# Patient Record
Sex: Male | Born: 1955
Health system: Southern US, Community
[De-identification: ages and names within clinical notes are randomized; demographics above are authoritative.]

## PROBLEM LIST (undated history)

## (undated) DIAGNOSIS — I639 Cerebral infarction, unspecified: Secondary | ICD-10-CM

## (undated) DIAGNOSIS — I10 Essential (primary) hypertension: Secondary | ICD-10-CM

## (undated) DIAGNOSIS — K859 Acute pancreatitis without necrosis or infection, unspecified: Secondary | ICD-10-CM

## (undated) DIAGNOSIS — R569 Unspecified convulsions: Secondary | ICD-10-CM

## (undated) DIAGNOSIS — E119 Type 2 diabetes mellitus without complications: Secondary | ICD-10-CM

## (undated) HISTORY — PX: ESOPHAGOGASTRODUODENOSCOPY: SHX1529

## (undated) SURGERY — Surgical Case
Anesthesia: *Unknown

---

## 1998-08-10 ENCOUNTER — Emergency Department (HOSPITAL_COMMUNITY): Admission: EM | Admit: 1998-08-10 | Discharge: 1998-08-10 | Payer: Self-pay | Admitting: Emergency Medicine

## 2005-10-08 ENCOUNTER — Emergency Department (HOSPITAL_COMMUNITY): Admission: EM | Admit: 2005-10-08 | Discharge: 2005-10-08 | Payer: Self-pay | Admitting: Emergency Medicine

## 2006-01-29 ENCOUNTER — Ambulatory Visit: Payer: Self-pay | Admitting: Family Medicine

## 2007-12-01 ENCOUNTER — Other Ambulatory Visit: Payer: Self-pay

## 2007-12-01 ENCOUNTER — Inpatient Hospital Stay: Payer: Self-pay | Admitting: *Deleted

## 2008-04-30 ENCOUNTER — Emergency Department: Payer: Self-pay | Admitting: Emergency Medicine

## 2011-04-09 ENCOUNTER — Inpatient Hospital Stay (HOSPITAL_COMMUNITY)
Admission: EM | Admit: 2011-04-09 | Discharge: 2011-04-12 | DRG: 603 | Disposition: A | Discharge status: Home / Self Care | Payer: Self-pay | Attending: Internal Medicine | Admitting: Internal Medicine

## 2011-04-09 ENCOUNTER — Emergency Department (HOSPITAL_COMMUNITY): Payer: Self-pay

## 2011-04-09 DIAGNOSIS — IMO0002 Reserved for concepts with insufficient information to code with codable children: Secondary | ICD-10-CM | POA: Diagnosis present

## 2011-04-09 DIAGNOSIS — I10 Essential (primary) hypertension: Secondary | ICD-10-CM | POA: Diagnosis present

## 2011-04-09 DIAGNOSIS — Z79899 Other long term (current) drug therapy: Secondary | ICD-10-CM

## 2011-04-09 DIAGNOSIS — Z23 Encounter for immunization: Secondary | ICD-10-CM

## 2011-04-09 DIAGNOSIS — F17200 Nicotine dependence, unspecified, uncomplicated: Secondary | ICD-10-CM | POA: Diagnosis present

## 2011-04-09 DIAGNOSIS — L03211 Cellulitis of face: Principal | ICD-10-CM | POA: Diagnosis present

## 2011-04-09 DIAGNOSIS — Z7982 Long term (current) use of aspirin: Secondary | ICD-10-CM

## 2011-04-09 DIAGNOSIS — E119 Type 2 diabetes mellitus without complications: Secondary | ICD-10-CM | POA: Diagnosis present

## 2011-04-09 DIAGNOSIS — I889 Nonspecific lymphadenitis, unspecified: Secondary | ICD-10-CM | POA: Diagnosis present

## 2011-04-09 DIAGNOSIS — L0201 Cutaneous abscess of face: Principal | ICD-10-CM | POA: Diagnosis present

## 2011-04-09 DIAGNOSIS — K122 Cellulitis and abscess of mouth: Secondary | ICD-10-CM

## 2011-04-09 HISTORY — DX: Essential (primary) hypertension: I10

## 2011-04-09 LAB — CBC
MCH: 27.6 pg (ref 26.0–34.0)
MCH: 28.1 pg (ref 26.0–34.0)
MCHC: 34.3 g/dL (ref 30.0–36.0)
MCV: 81.8 fL (ref 78.0–100.0)
Platelets: 180 10*3/uL (ref 150–400)
Platelets: 179 10*3/uL (ref 150–400)
RBC: 5.25 MIL/uL (ref 4.22–5.81)
RBC: 4.99 MIL/uL (ref 4.22–5.81)
RDW: 14 % (ref 11.5–15.5)
WBC: 7.1 10*3/uL (ref 4.0–10.5)

## 2011-04-09 LAB — BASIC METABOLIC PANEL
CO2: 28 mEq/L (ref 19–32)
Calcium: 10 mg/dL (ref 8.4–10.5)
Chloride: 102 mEq/L (ref 96–112)
GFR calc Af Amer: 75 mL/min — ABNORMAL LOW (ref >90)
Sodium: 139 mEq/L (ref 135–145)

## 2011-04-09 LAB — PROTIME-INR
INR: 0.91 (ref 0.00–1.49)
Prothrombin Time: 12.5 seconds (ref 11.6–15.2)

## 2011-04-09 MED ORDER — ZOLPIDEM TARTRATE 5 MG PO TABS: 5.0000 mg | ORAL_TABLET | Freq: Every evening | ORAL | Status: DC | PRN

## 2011-04-09 MED ORDER — CLINDAMYCIN PHOSPHATE 600 MG/50ML IV SOLN
600.0000 mg | Freq: Once | INTRAVENOUS | Status: AC
  Administered 2011-04-09: 600 mg via INTRAVENOUS
  Filled 2011-04-09: qty 50

## 2011-04-09 MED ORDER — FOLIC ACID 1 MG PO TABS
1.0000 mg | ORAL_TABLET | Freq: Every day | ORAL | Status: DC
  Administered 2011-04-09 – 2011-04-11 (×3): 1 mg via ORAL
  Filled 2011-04-09 (×4): qty 1

## 2011-04-09 MED ORDER — ACETAMINOPHEN 325 MG PO TABS: 650.0000 mg | ORAL_TABLET | Freq: Four times a day (QID) | ORAL | Status: DC | PRN

## 2011-04-09 MED ORDER — SENNOSIDES-DOCUSATE SODIUM 8.6-50 MG PO TABS: 1.0000 | ORAL_TABLET | Freq: Every day | ORAL | Status: DC | PRN

## 2011-04-09 MED ORDER — SODIUM CHLORIDE 0.9 % IV SOLN
INTRAVENOUS | Status: DC
  Administered 2011-04-09: 17:00:00 via INTRAVENOUS

## 2011-04-09 MED ORDER — ACETAMINOPHEN 650 MG RE SUPP: 650.0000 mg | Freq: Four times a day (QID) | RECTAL | Status: DC | PRN

## 2011-04-09 MED ORDER — HYDROCODONE-ACETAMINOPHEN 5-325 MG PO TABS: 1.0000 | ORAL_TABLET | ORAL | Status: DC | PRN

## 2011-04-09 MED ORDER — ENOXAPARIN SODIUM 40 MG/0.4ML ~~LOC~~ SOLN
40.0000 mg | SUBCUTANEOUS | Status: DC
  Administered 2011-04-09 – 2011-04-11 (×3): 40 mg via SUBCUTANEOUS
  Filled 2011-04-09 (×4): qty 0.4

## 2011-04-09 MED ORDER — POLYETHYLENE GLYCOL 3350 17 G PO PACK
17.0000 g | PACK | Freq: Every day | ORAL | Status: DC
Start: 2011-04-09 — End: 2011-04-12
  Administered 2011-04-09 – 2011-04-10 (×2): 17 g via ORAL
  Filled 2011-04-09 (×4): qty 1

## 2011-04-09 MED ORDER — MORPHINE SULFATE 2 MG/ML IJ SOLN: 1.0000 mg | INTRAMUSCULAR | Status: DC | PRN

## 2011-04-09 MED ORDER — SODIUM CHLORIDE 0.9 % IV SOLN
3.0000 g | Freq: Four times a day (QID) | INTRAVENOUS | Status: DC
  Administered 2011-04-09 – 2011-04-12 (×11): 3 g via INTRAVENOUS
  Filled 2011-04-09 (×13): qty 3

## 2011-04-09 MED ORDER — LISINOPRIL 40 MG PO TABS
40.0000 mg | ORAL_TABLET | Freq: Every day | ORAL | Status: DC
  Administered 2011-04-09 – 2011-04-11 (×3): 40 mg via ORAL
  Filled 2011-04-09 (×4): qty 1

## 2011-04-09 MED ORDER — PROMETHAZINE HCL 12.5 MG PO TABS
12.5000 mg | ORAL_TABLET | Freq: Four times a day (QID) | ORAL | Status: DC | PRN
  Filled 2011-04-09: qty 1

## 2011-04-09 MED ORDER — THERA M PLUS PO TABS
1.0000 | ORAL_TABLET | Freq: Every day | ORAL | Status: DC
  Administered 2011-04-09 – 2011-04-11 (×3): 1 via ORAL
  Filled 2011-04-09 (×4): qty 1

## 2011-04-09 MED ORDER — ONDANSETRON HCL 4 MG/2ML IJ SOLN: 4.0000 mg | Freq: Four times a day (QID) | INTRAMUSCULAR | Status: DC | PRN

## 2011-04-09 MED ORDER — ALUM & MAG HYDROXIDE-SIMETH 200-200-20 MG/5ML PO SUSP: 30.0000 mL | Freq: Four times a day (QID) | ORAL | Status: DC | PRN

## 2011-04-09 MED ORDER — VANCOMYCIN HCL IN DEXTROSE 1-5 GM/200ML-% IV SOLN
1000.0000 mg | Freq: Two times a day (BID) | INTRAVENOUS | Status: DC
  Administered 2011-04-09 – 2011-04-10 (×2): 1000 mg via INTRAVENOUS
  Filled 2011-04-09 (×3): qty 200

## 2011-04-09 MED ORDER — ONDANSETRON HCL 4 MG PO TABS: 4.0000 mg | ORAL_TABLET | Freq: Four times a day (QID) | ORAL | Status: DC | PRN

## 2011-04-09 MED ORDER — SODIUM CHLORIDE 0.9 % IV BOLUS (SEPSIS)
500.0000 mL | Freq: Once | INTRAVENOUS | Status: AC
  Administered 2011-04-09: 500 mL via INTRAVENOUS

## 2011-04-09 MED ORDER — PROMETHAZINE HCL 25 MG/ML IJ SOLN: 12.5000 mg | Freq: Four times a day (QID) | INTRAMUSCULAR | Status: DC | PRN

## 2011-04-09 MED ORDER — ALBUTEROL SULFATE (5 MG/ML) 0.5% IN NEBU: 2.5000 mg | INHALATION_SOLUTION | RESPIRATORY_TRACT | Status: DC | PRN

## 2011-04-09 MED ORDER — IOHEXOL 300 MG/ML  SOLN
75.0000 mL | Freq: Once | INTRAMUSCULAR | Status: AC | PRN
  Administered 2011-04-09: 75 mL via INTRAVENOUS

## 2011-04-09 NOTE — Progress Notes (Signed)
ANTIBIOTIC CONSULT NOTE - INITIAL  Pharmacy Consult for  Vancomycin & Unasyn Indication:   Facial cellulitis  No Known Allergies  Patient Measurements: Height: 5\' 8"  (172.7 cm) Weight: 210 lb (95.255 kg) IBW/kg (Calculated) : 68.4  Dosing Weight: 95 kg  Vital Signs: Temp: 97.5 F (36.4 C) (11/12 1700) Temp src: Oral (11/12 1700) BP: 156/103 mmHg (11/12 1742) Pulse Rate: 95  (11/12 1700)   Labs:  Basename 04/09/11 1711 04/09/11 1024  WBC 6.8 7.1  HGB 14.0 14.5  PLT 179 180  LABCREA -- --  CREATININE -- 1.23   Estimated Creatinine Clearance: 76 ml/min (by C-G formula based on Cr of 1.23).   Microbiology:  No cultures  Medical History: Past Medical History  Diagnosis Date  . Hypertension    Diet-controlled DM  Smoker  Medications:     Lisinopril 40 mg daily (as at home)    Folic Acid 1 mg daily, MVI, Miralax,     Lovenox 40 mg SQ q24hrs   Assessment:    Clindmycin 600 mg IV given in ED ~ 11:30 am. Now changing to Unasyn and Vancomycin.  Evaluted by ENT. May require I&D if suppurates.  Goal of Therapy:  Vancomycin trough level 10-15 mcg/ml Appropriate Unasyn dose for renal function and infection  Plan:    Vancomycin 1 gram IV q12hrs.   Unasyn 3 grams IV q6 hrs.   Will follow up progress and will consider checking Vancomycin trough level if >5 days therapy needed.  Dennie Fetters 04/09/2011,6:04 PM

## 2011-04-09 NOTE — H&P (Signed)
PCP:   No primary provider on file.   Chief Complaint:  Sub-mandibular swelling since last Tuesday  HPI: Patient is a 55 year old African American male, who has a history of hypertension, claims he has a history of diet-controlled diabetes who presents to the hospital with the above-noted complaints. The patient he was in his usual state of health until last Tuesday when he tried to dye his beard with "haircare ferment". Since then patient noticed that on the right jaw area he had some induration, he claims that over the past week in duration slowly spread and now mostly involves his submental and submandibular area. He denies any fever. He claims that the swelling has significantly increased over the past 2-3 days. He denies any recent dental problems. He denies any cough or shortness of breath. On exam he does not have any stridor. He appears very comfortable. The hospitalist service has not been asked to evaluate and admit this patient for further treatment and monitoring. Please note the CT scan of the area was done in the ED which only shows cellulitis but is not show an abscess. I have already placed an official consult with ENT.  Review of Systems:  The patient denies anorexia, fever, weight loss,, vision loss, decreased hearing, hoarseness, chest pain, syncope, dyspnea on exertion, peripheral edema, balance deficits, hemoptysis, abdominal pain, melena, hematochezia, severe indigestion/heartburn, hematuria, incontinence, genital sores, muscle weakness, suspicious skin lesions, transient blindness, difficulty walking, depression, unusual weight change, abnormal bleeding, enlarged lymph nodes, angioedema, and breast masses.  Past Medical History: Past Medical History  Diagnosis Date  . Hypertension    History reviewed. No pertinent past surgical history.  Medications: Prior to Admission medications   Medication Sig Start Date End Date Taking? Authorizing Provider    Aspirin-Salicylamide-Caffeine (BC HEADACHE POWDER PO) Take 1 packet by mouth daily as needed. For pain    Yes Historical Provider, MD  lisinopril (PRINIVIL,ZESTRIL) 40 MG tablet Take 40 mg by mouth daily.     Yes Historical Provider, MD    Allergies:  No Known Allergies  Social History:  reports that he has been smoking.  He does not have any smokeless tobacco history on file. He reports that he drinks alcohol. He reports that he does not use illicit drugs.  Family History: History reviewed. No pertinent family history.  Physical Exam: Blood pressure 123/80, pulse 86, temperature 98.2 F (36.8 C), temperature source Oral, resp. rate 18, SpO2 97.00%. General appearance -looks very comfortable, nontoxic looking. No stridor. Is awake alert with clear speech. HEENT -atraumatic normocephalic pupils are equally reactive to light and accommodation. Neck -there is a approximate 4 inches x2-1/2 inches indurated area in the submental region. This indurated area is tender. Otherwise the neck is supple. Chest -bilaterally clear to auscultation. There is no stridor. CVS -heart sounds are regular no murmurs heard. Abdomen -soft, bowel sounds are present, nontender and nondistended. Extremities -no edema. Neurology -awake alert, nonfocal exam. Skin -he does appear to have mild de-squamation in his facial area. No frank rash visible. Wounds -N./A.  Labs on Admission:   Basename 04/09/11 1024  NA 139  K 4.1  CL 102  CO2 28  GLUCOSE 129*  BUN 11  CREATININE 1.23  CALCIUM 10.0  MG --  PHOS --   No results found for this basename: AST:2,ALT:2,ALKPHOS:2,BILITOT:2,PROT:2,ALBUMIN:2 in the last 72 hours No results found for this basename: LIPASE:2,AMYLASE:2 in the last 72 hours  Basename 04/09/11 1024  WBC 7.1  NEUTROABS --  HGB 14.5  HCT 43.2  MCV 82.3  PLT 180   No results found for this basename: CKTOTAL:3,CKMB:3,CKMBINDEX:3,TROPONINI:3 in the last 72 hours No results found for this  basename: TSH,T4TOTAL,FREET3,T3FREE,THYROIDAB in the last 72 hours No results found for this basename: VITAMINB12:2,FOLATE:2,FERRITIN:2,TIBC:2,IRON:2,RETICCTPCT:2 in the last 72 hours  Radiological Exams on Admission: Ct Soft Tissue Neck W Contrast  04/09/2011  *RADIOLOGY REPORT*  Clinical Data: Left neck swelling.  Assess for abscess.  CT NECK WITH CONTRAST  Technique:  Multidetector CT imaging of the neck was performed with intravenous contrast.  Contrast: 75mL OMNIPAQUE IOHEXOL 300 MG/ML IV SOLN  Comparison: None.  Findings: Lung apices are clear.  No superior mediastinal pathology.  Limited visualization of the intracranial contents does not show any abnormality.  Both parotid glands are normal.  There are slightly prominent lymph nodes adjacent to the posterior margins of the parotid glands. There are slightly prominent lymph nodes throughout the neck bilaterally including levels one, II, III and four.  None of these lymph nodes exceeds 12 mm in diameter.  None shows suppuration. There may be very slight subcutaneous edema that could go along with mild cellulitis.  This appears to be bilateral.  I do not see any evidence of abscess or advanced inflammatory disease.  No mucosal or submucosal lesion is seen.  No evidence of mass lesion. Arterial and venous structures are patent.  The thyroid gland and submandibular glands are normal.  IMPRESSION: Mild subcutaneous edema suggesting mild cellulitis.  No evidence of abscess or advanced to a soft tissue inflammation.  Mild reactive lymphadenopathy on both sides of the neck.  No large / dominant nodes or suppurative nodes.  Original Report Authenticated By: Thomasenia Sales, M.D.    Assessment/Plan Present on Admission:  .Ludwig's angina -This likely is a soft tissue infection involving the submental area, highly suspicious for Ludwig's angina. -Although the patient does appear to have significant induration, the CT scan surprisingly does not show any  abscess. -I will start the patient on vancomycin and Unasyn, I placed ENT consultation with [facility], Dr. Christia Reading will evaluate the patient. -Patient does not appear to have any airway compromise clinically, we will continue to monitor him.   Marland KitchenHTN (hypertension) -Continue with lisinopril.   .DM (diabetes mellitus) -Apparently this is diet controlled, we will check a hemoglobin A1c.  Total time spent for admission 35 minutes.  Daniel [name] 04/09/2011, 1:02 PM

## 2011-04-09 NOTE — ED Provider Notes (Signed)
History     CSN: [id number] Arrival date & time: 04/09/2011  9:43 AM   First MD Initiated Contact with Patient 04/09/11 1014      Chief Complaint  Patient presents with  . Facial Swelling   Pleasant 55 year old gentleman with a known history of hypertension and diet-controlled diabetes. He states he used "haircare ferment" on his beard area. Several days ago. Since then. He has noticed new swelling and tenderness underneath his chin since yesterday. He's had no fevers no vomiting. No weakness or dizziness. Denies any difficulty breathing. His tetanus is up to date, 3 years ago. Patient denies any dental pain  (Consider location/radiation/quality/duration/timing/severity/associated sxs/prior treatment) HPI  Past Medical History  Diagnosis Date  . Hypertension     History reviewed. No pertinent past surgical history.  History reviewed. No pertinent family history.  History  Substance Use Topics  . Smoking status: Current Everyday Smoker  . Smokeless tobacco: Not on file  . Alcohol Use: Yes      Review of Systems  All other systems reviewed and are negative.   Allergies  Review of patient's allergies indicates no known allergies.  Home Medications   Current Outpatient Rx  Name Route Sig Dispense Refill  . BC HEADACHE POWDER PO Oral Take 1 packet by mouth daily as needed. For pain     . LISINOPRIL 40 MG PO TABS Oral Take 40 mg by mouth daily.        BP 144/95  Pulse 104  Temp 97.7 F (36.5 C)  Resp 18  SpO2 98%  Physical Exam  Constitutional: He is oriented to person, place, and time. He appears well-developed and well-nourished.  HENT:  Head: Normocephalic and atraumatic.       Swelling induration, and tenderness in the submandibular area centrally. Oral pharynx is clear. No tongue or lip swelling.  Eyes: Conjunctivae and EOM are normal. Pupils are equal, round, and reactive to light.  Neck: Neck supple.  Cardiovascular: Normal rate and regular rhythm.   Exam reveals no gallop and no friction rub.   No murmur heard. Pulmonary/Chest: Breath sounds normal. He has no wheezes. He has no rales. He exhibits no tenderness.  Abdominal: Soft. Bowel sounds are normal. He exhibits no distension. There is no tenderness. There is no rebound and no guarding.  Musculoskeletal: Normal range of motion.  Neurological: He is alert and oriented to person, place, and time. No cranial nerve deficit. Coordination normal.  Skin: Skin is warm and dry. No rash noted.  Psychiatric: He has a normal mood and affect.    ED Course  Procedures (including critical care time)  Labs Reviewed - No data to display No results found.   No diagnosis found.    MDM  Pt is seen and examined;  Initial history and physical completed.  Will follow.          Charda Janis A. Patrica Duel, MD 04/09/11 1025

## 2011-04-09 NOTE — ED Notes (Signed)
IV SL  Per PT request

## 2011-04-09 NOTE — ED Notes (Signed)
Patient presents with swelling and tightness under chin since yesterday.  Airway patent and intact, patient denies change in breathing.  Patient has taken benadryl but has not reduced symptoms.  Skin intact.

## 2011-04-09 NOTE — Consult Note (Addendum)
Reason for Consult:Neck infection Referring Physician: Hospitalist  Daniel [name] is an 55 y.o. male.  HPI: 55 year old african [place] male who used [employer] on beard 7 days ago.  Woke the next morning with rash and swelling of facial skin in same distribution.  Took Benadryl as advised by his pharmacist.  Noticed improvement in facial swelling a few days later but developed a firm area in the right neck yesterday that settled down into his submental neck by this morning.  Went to the ER where a CT scan was performed.  Admitted for medical therapy.  Describes pain as mild and unchanged.  Nothing aggravates or relieves symptom.  Past Medical History  Diagnosis Date  . Hypertension     History reviewed. No pertinent past surgical history.  History reviewed. No pertinent family history.  Social History:  reports that he has been smoking.  He does not have any smokeless tobacco history on file. He reports that he drinks alcohol. He reports that he does not use illicit drugs.  Allergies: No Known Allergies  Medications: I have reviewed the patient's current medications.  Results for orders placed during the hospital encounter of 04/09/11 (from the past 48 hour(s))  CBC     Status: Normal   Collection Time   04/09/11 10:24 AM      Component Value Range Comment   WBC 7.1  4.0 - 10.5 (K/uL)    RBC 5.25  4.22 - 5.81 (MIL/uL)    Hemoglobin 14.5  13.0 - 17.0 (g/dL)    HCT 16.1  09.6 - 04.5 (%)    MCV 82.3  78.0 - 100.0 (fL)    MCH 27.6  26.0 - 34.0 (pg)    MCHC 33.6  30.0 - 36.0 (g/dL)    RDW 40.9  81.1 - 91.4 (%)    Platelets 180  150 - 400 (K/uL)   BASIC METABOLIC PANEL     Status: Abnormal   Collection Time   04/09/11 10:24 AM      Component Value Range Comment   Sodium 139  135 - 145 (mEq/L)    Potassium 4.1  3.5 - 5.1 (mEq/L)    Chloride 102  96 - 112 (mEq/L)    CO2 28  19 - 32 (mEq/L)    Glucose, Bld 129 (*) 70 - 99 (mg/dL)    BUN 11  6 - 23 (mg/dL)    Creatinine, Ser  7.82  0.50 - 1.35 (mg/dL)    Calcium 95.6  8.4 - 10.5 (mg/dL)    GFR calc non Af Amer 64 (*) >90 (mL/min)    GFR calc Af Amer 75 (*) >90 (mL/min)   PROTIME-INR     Status: Normal   Collection Time   04/09/11  1:18 PM      Component Value Range Comment   Prothrombin Time 12.5  11.6 - 15.2 (seconds)    INR 0.91  0.00 - 1.49    HEMOGLOBIN A1C     Status: Abnormal   Collection Time   04/09/11  1:19 PM      Component Value Range Comment   Hemoglobin A1C 5.7 (*) <5.7 (%)    Mean Plasma Glucose 117 (*) <117 (mg/dL)     Ct Soft Tissue Neck W Contrast  04/09/2011  *RADIOLOGY REPORT*  Clinical Data: Left neck swelling.  Assess for abscess.  CT NECK WITH CONTRAST  Technique:  Multidetector CT imaging of the neck was performed with intravenous contrast.  Contrast:  75mL OMNIPAQUE IOHEXOL 300 MG/ML IV SOLN  Comparison: None.  Findings: Lung apices are clear.  No superior mediastinal pathology.  Limited visualization of the intracranial contents does not show any abnormality.  Both parotid glands are normal.  There are slightly prominent lymph nodes adjacent to the posterior margins of the parotid glands. There are slightly prominent lymph nodes throughout the neck bilaterally including levels one, II, III and four.  None of these lymph nodes exceeds 12 mm in diameter.  None shows suppuration. There may be very slight subcutaneous edema that could go along with mild cellulitis.  This appears to be bilateral.  I do not see any evidence of abscess or advanced inflammatory disease.  No mucosal or submucosal lesion is seen.  No evidence of mass lesion. Arterial and venous structures are patent.  The thyroid gland and submandibular glands are normal.  IMPRESSION: Mild subcutaneous edema suggesting mild cellulitis.  No evidence of abscess or advanced to a soft tissue inflammation.  Mild reactive lymphadenopathy on both sides of the neck.  No large / dominant nodes or suppurative nodes.  Original Report Authenticated  By: Thomasenia Sales, M.D.    Review of Systems  All other systems reviewed and are negative.   Blood pressure 136/98, pulse 86, temperature 97.3 F (36.3 C), temperature source Oral, resp. rate 20, height 5\' 8"  (1.727 m), weight 95.255 kg (210 lb), SpO2 97.00%. Physical Exam  Constitutional: He is oriented to person, place, and time. He appears well-developed and well-nourished. No distress.  HENT:  Head: Normocephalic and atraumatic.  Right Ear: External ear normal.  Left Ear: External ear normal.  Nose: Nose normal.  Mouth/Throat: Oropharynx is clear and moist. No oropharyngeal exudate.       Upper denture.  Lower partial.  Poor lower dentition with a loose tooth on the left.  No tender teeth.  Eyes: Conjunctivae and EOM are normal. Pupils are equal, round, and reactive to light.  Neck: Normal range of motion. No tracheal deviation present. No thyromegaly present.  Cardiovascular:       Defer to medical team.  Respiratory: No stridor.       Defer to medical team.  GI:       Did not examine.  Genitourinary:       Did not examine.  Musculoskeletal: Normal range of motion. He exhibits no edema.  Lymphadenopathy:    He has cervical adenopathy (prominent submental firmness, no fluctuance, mildly tender).  Neurological: He is alert and oriented to person, place, and time. No cranial nerve deficit.  Skin: Skin is warm and dry.  Psychiatric: He has a normal mood and affect.    Assessment/Plan: Submental cellulitis, acute lymphadenitis.  This does not represent true Ludwig's angina with no floor of mouth involvement.  I personally reviewed the neck CT showing enlarged nodes through both sides of the neck, particularly in the submental neck with associated soft tissue stranding and no clear evidence of a fluid collection.  Recommend IV antibiotic therapy with clindamycin or Unasyn.  Currently, it is very possible that infection will resolve with IV antibiotic therapy alone.  May  require I&D if suppurates.  Airway compromise is not likely at this point.  Will follow daily.  Upon further review of the CT, there is lucency around a left premolar mandibular tooth root.  This may represent the source of his infection.  Recommend oral surgery consultation.  Dandria Griego D 04/09/2011, 5:08 PM

## 2011-04-10 LAB — BASIC METABOLIC PANEL
BUN: 10 mg/dL (ref 6–23)
CO2: 27 mEq/L (ref 19–32)
Chloride: 104 mEq/L (ref 96–112)
Creatinine, Ser: 1.11 mg/dL (ref 0.50–1.35)

## 2011-04-10 LAB — CBC
HCT: 39.6 % (ref 39.0–52.0)
MCV: 82.2 fL (ref 78.0–100.0)
RBC: 4.82 MIL/uL (ref 4.22–5.81)
RDW: 13.9 % (ref 11.5–15.5)
WBC: 5.2 10*3/uL (ref 4.0–10.5)

## 2011-04-10 NOTE — Progress Notes (Signed)
04/10/2011 Daniel [name], [name] SP[facility] Case Management Note [phone]       Information and application for the [facility] in [facility] given to patient.

## 2011-04-10 NOTE — Progress Notes (Signed)
Subjective: Unchanged overnight. No problems breathing or swallowing.  Objective: Vital signs in last 24 hours: Temp:  [97.3 F (36.3 C)-98.7 F (37.1 C)] 98.7 F (37.1 C) (11/13 0516) Pulse Rate:  [86-97] 97  (11/13 0516) Resp:  [20] 20  (11/13 0516) BP: (136-156)/(87-103) 144/87 mmHg (11/13 0950) SpO2:  [97 %] 97 % (11/13 0516) Weight:  [95.255 kg (210 lb)] 210 lb (95.255 kg) (11/12 1553) Weight change:  Body mass index is 31.93 kg/(m^2).  Intake/Output from previous day: 11/12 0701 - 11/13 0700 In: 2184 [P.O.:600; I.V.:1184; IV Piggyback:400] Out: -    PHYSICAL EXAM: Gen Exam: Awake and alert with clear speech.   HEENT-unchanged induration in the submental/submandibular area. Neck: Supple, No JVD.   Chest: B/L Clear.   CVS: S1 S2 Regular, no murmurs.  Abdomen: soft, BS +, non tender, non distended.  Extremities: no edema, warm.   Neurologic: Non Focal.   Skin: No Rash.   Wounds: N/A.    Lab Results:  Basename 04/10/11 0712 04/09/11 1711  WBC 5.2 6.8  HGB 12.8* 14.0  HCT 39.6 40.8  PLT 168 179   CMET CMP     Component Value Date/Time   NA 139 04/10/2011 0712   K 4.2 04/10/2011 0712   CL 104 04/10/2011 0712   CO2 27 04/10/2011 0712   GLUCOSE 116* 04/10/2011 0712   BUN 10 04/10/2011 0712   CREATININE 1.11 04/10/2011 0712   CALCIUM 9.1 04/10/2011 0712   GFRNONAA 73* 04/10/2011 0712   GFRAA 85* 04/10/2011 0712    LIPIDS @LASTLIPID @ @LASTBNP @ COAGS  Basename 04/09/11 1318  INR 0.91   MICROBIOLOGY: No results found for this or any previous visit (from the past 240 hour(s)).  Studies/Results: Ct Soft Tissue Neck W Contrast  04/09/2011  *RADIOLOGY REPORT*  Clinical Data: Left neck swelling.  Assess for abscess.  CT NECK WITH CONTRAST  Technique:  Multidetector CT imaging of the neck was performed with intravenous contrast.  Contrast: 75mL OMNIPAQUE IOHEXOL 300 MG/ML IV SOLN  Comparison: None.  Findings: Lung apices are clear.  No superior mediastinal  pathology.  Limited visualization of the intracranial contents does not show any abnormality.  Both parotid glands are normal.  There are slightly prominent lymph nodes adjacent to the posterior margins of the parotid glands. There are slightly prominent lymph nodes throughout the neck bilaterally including levels one, II, III and four.  None of these lymph nodes exceeds 12 mm in diameter.  None shows suppuration. There may be very slight subcutaneous edema that could go along with mild cellulitis.  This appears to be bilateral.  I do not see any evidence of abscess or advanced inflammatory disease.  No mucosal or submucosal lesion is seen.  No evidence of mass lesion. Arterial and venous structures are patent.  The thyroid gland and submandibular glands are normal.  IMPRESSION: Mild subcutaneous edema suggesting mild cellulitis.  No evidence of abscess or advanced to a soft tissue inflammation.  Mild reactive lymphadenopathy on both sides of the neck.  No large / dominant nodes or suppurative nodes.  Original Report Authenticated By: Thomasenia Sales, M.D.    Medications:  Scheduled:   . ampicillin-sulbactam (UNASYN) IV  3 g Intravenous Q6H  . clindamycin (CLEOCIN) IV  600 mg Intravenous Once  . enoxaparin  40 mg Subcutaneous Q24H  . folic acid  1 mg Oral Daily  . lisinopril  40 mg Oral Daily  . multivitamins ther. w/minerals  1 tablet Oral Daily  .  polyethylene glycol  17 g Oral Daily  . vancomycin  1,000 mg Intravenous Q12H    Assessment/Plan: Principal Problem:  *Lymphadenitis -Continue with Unasyn. -DC vancomycin -Appreciate ENT input. -Have left a message for oral surgery at Dr. Theora Gianotti office. Awaiting call back.  Active Problems:   HTN (hypertension) -Controlled with lisinopril   DM (diabetes mellitus) -HbA1c is 5.7.  Disposition -Remain inpatient     Maretta Bees, MD. 04/10/2011, 11:40 AM

## 2011-04-10 NOTE — Progress Notes (Signed)
Subjective: Did fine overnight.  Does not feel that neck is any worse.  Good swallow and breathing.  Objective: Vital signs in last 24 hours: Temp:  [97.3 F (36.3 C)-98.7 F (37.1 C)] 98.7 F (37.1 C) (11/13 0516) Pulse Rate:  [86-104] 97  (11/13 0516) Resp:  [18-20] 20  (11/13 0516) BP: (123-156)/(80-103) 142/91 mmHg (11/13 0516) SpO2:  [97 %-98 %] 97 % (11/13 0516) Weight:  [95.255 kg (210 lb)] 210 lb (95.255 kg) (11/12 1553)  General appearance: alert, cooperative and no distress Head: Normocephalic, without obvious abnormality, atraumatic Eyes: conjunctivae/corneas clear. PERRL, EOM's intact. Fundi benign. Throat: lips, mucosa, and tongue normal; teeth and gums normal and with upper denture and lower partial.  Left lower premolar loose. Neck: no adenopathy, no carotid bruit, no JVD, supple, symmetrical, trachea midline, thyroid not enlarged, symmetric, no tenderness/mass/nodules and firm submental area no different than yesterday with no fluctuance.  @LABLAST2 (wbc:2,hgb:2,hct:2,plt:2)  Basename 04/10/11 0712 04/09/11 1711 04/09/11 1024  NA 139 -- 139  K 4.2 -- 4.1  CL 104 -- 102  CO2 27 -- 28  GLUCOSE 116* -- 129*  BUN 10 -- 11  CREATININE 1.11 1.08 --  CALCIUM 9.1 -- 10.0    Medications: I have reviewed the patient's current medications.  Assessment/Plan: Submental cellulitis/acute lymphadenitis. No worsening since yesterday.  WBC normal, no fever.  Continue IV antibiotics.  Continue to follow.  May still require I&D.  Recommend oral surgery consultation regarding loose lower premolar as potential source of infection.   LOS: 1 day   Antonea Gaut D 04/10/2011, 8:41 AM

## 2011-04-11 ENCOUNTER — Inpatient Hospital Stay (HOSPITAL_COMMUNITY): Payer: Self-pay

## 2011-04-11 LAB — CBC
HCT: 41 % (ref 39.0–52.0)
Hemoglobin: 13.7 g/dL (ref 13.0–17.0)
MCH: 27.5 pg (ref 26.0–34.0)
MCHC: 33.4 g/dL (ref 30.0–36.0)

## 2011-04-11 NOTE — Progress Notes (Signed)
Subjective: Thought the neck improved some yesterday but seemed to return to original state.  Still normal swallow and breathing.  Objective: Vital signs in last 24 hours: Temp:  [97.3 F (36.3 C)-98.2 F (36.8 C)] 98.2 F (36.8 C) (11/14 0620) Pulse Rate:  [61-77] 68  (11/14 0620) Resp:  [14-20] 20  (11/14 0620) BP: (137-148)/(83-95) 137/83 mmHg (11/14 0620) SpO2:  [96 %-99 %] 99 % (11/14 0620)  General appearance: alert, cooperative and no distress Throat: lips, mucosa, and tongue normal; teeth and gums normal Neck: submental firmness no different than yesterday, stable size, not fluctuant  @LABLAST2 (wbc:2,hgb:2,hct:2,plt:2)  Basename 04/10/11 0712 04/09/11 1711 04/09/11 1024  NA 139 -- 139  K 4.2 -- 4.1  CL 104 -- 102  CO2 27 -- 28  GLUCOSE 116* -- 129*  BUN 10 -- 11  CREATININE 1.11 1.08 --  CALCIUM 9.1 -- 10.0    Medications: I have reviewed the patient's current medications.  Assessment/Plan: Submental cellulitis/lymphadenitis Exam unchanged today.  Still no fluctuance to clearly suggest abscess. Normal WBC and no fever.  Continue IV antibiotic.  If no better tomorrow, will consider repeat imaging.  Hopefully, will improve by tomorrow and can then discharge on oral antibiotic.  Still waiting for oral surgery consultation.   LOS: 2 days   Kerrianne Jeng D 04/11/2011, 8:52 AM

## 2011-04-11 NOTE — Progress Notes (Signed)
Subjective: -Unchanged. -No difficulty breathing or swallowing.  Objective: Vital signs in last 24 hours: Temp:  [97.3 F (36.3 C)-98.2 F (36.8 C)] 98.2 F (36.8 C) (11/14 0620) Pulse Rate:  [61-77] 68  (11/14 0620) Resp:  [14-20] 20  (11/14 0620) BP: (137-148)/(83-95) 137/83 mmHg (11/14 0620) SpO2:  [96 %-99 %] 99 % (11/14 0620) Weight change:  Body mass index is 31.93 kg/(m^2).  Intake/Output from previous day: 11/13 0701 - 11/14 0700 In: 1258.8 [P.O.:720; I.V.:438.8; IV Piggyback:100] Out: 4 [Urine:4]   PHYSICAL EXAM: Gen Exam: Awake and alert with clear speech.   HEENT-unchanged induration in the submental/submandibular area. Still somewhat tender, no obvious fluctuation. Neck: Supple, No JVD.   Chest: B/L Clear.   CVS: S1 S2 Regular, no murmurs.  Abdomen: soft, BS +, non tender, non distended.  Extremities: no edema, warm.   Neurologic: Non Focal.   Skin: No Rash.   Wounds: N/A.    Lab Results:  Basename 04/11/11 0720 04/10/11 0712  WBC 5.2 5.2  HGB 13.7 12.8*  HCT 41.0 39.6  PLT 191 168   CMET CMP     Component Value Date/Time   NA 139 04/10/2011 0712   K 4.2 04/10/2011 0712   CL 104 04/10/2011 0712   CO2 27 04/10/2011 0712   GLUCOSE 116* 04/10/2011 0712   BUN 10 04/10/2011 0712   CREATININE 1.11 04/10/2011 0712   CALCIUM 9.1 04/10/2011 0712   GFRNONAA 73* 04/10/2011 0712   GFRAA 85* 04/10/2011 0712    Basename 04/09/11 1318  INR 0.91   MICROBIOLOGY: No results found for this or any previous visit (from the past 240 hour(s)).  Studies/Results: No results found.  Medications:  Scheduled:    . ampicillin-sulbactam (UNASYN) IV  3 g Intravenous Q6H  . enoxaparin  40 mg Subcutaneous Q24H  . folic acid  1 mg Oral Daily  . lisinopril  40 mg Oral Daily  . multivitamins ther. w/minerals  1 tablet Oral Daily  . polyethylene glycol  17 g Oral Daily  . DISCONTD: vancomycin  1,000 mg Intravenous Q12H    Assessment/Plan: Principal Problem:  *Lymphadenitis -Continue with Unasyn. -Appreciate ENT input. Possibility of the imaging tomorrow for ENT note. -Have left a message for oral surgery at Dr. Theora Gianotti office again today (did not hear back from them yesterday). Awaiting call back.  Active Problems:  HTN (hypertension) -Controlled with lisinopril   DM (diabetes mellitus) -HbA1c is 5.7.  Disposition -Remain inpatient  Maretta Bees, MD. 04/11/2011, 11:46 AM

## 2011-04-12 MED ORDER — ACETAMINOPHEN 325 MG PO TABS: 650.0000 mg | ORAL_TABLET | Freq: Four times a day (QID) | ORAL | Status: AC | PRN

## 2011-04-12 MED ORDER — AMOXICILLIN-POT CLAVULANATE 875-125 MG PO TABS: 1.0000 | ORAL_TABLET | Freq: Two times a day (BID) | ORAL | Status: AC

## 2011-04-12 NOTE — Progress Notes (Signed)
Subjective: No complaints this morning.  Eating well.  Normal breathing.  Objective: Vital signs in last 24 hours: Temp:  [96.9 F (36.1 C)-98.5 F (36.9 C)] 98.5 F (36.9 C) (11/15 0500) Pulse Rate:  [69-82] 82  (11/15 0500) Resp:  [20-22] 20  (11/15 0500) BP: (136-159)/(80-106) 136/100 mmHg (11/15 0500) SpO2:  [97 %-99 %] 97 % (11/15 0500)  General appearance: alert, cooperative and no distress Neck: no adenopathy, supple, symmetrical, trachea midline and thyroid not enlarged, symmetric, no tenderness/mass/nodules, submental firmness unchanged with no fluctuance  @LABLAST2 (wbc:2,hgb:2,hct:2,plt:2)  Basename 04/10/11 0712 04/09/11 1711 04/09/11 1024  NA 139 -- 139  K 4.2 -- 4.1  CL 104 -- 102  CO2 27 -- 28  GLUCOSE 116* -- 129*  BUN 10 -- 11  CREATININE 1.11 1.08 --  CALCIUM 9.1 -- 10.0    Medications: I have reviewed the patient's current medications.  Assessment/Plan: Submental cellulitis/lymphadenitis The submental area remains firm and not fluctuant and has not worsened or improved much since admission.  WBC and temperature have been normal.  I discussed case with radiology who reviewed his admission CT and still did not feel that an abscess was present on the CT.  He did not feel that ultrasound would be a very good way look for fluid.  I discussed the case with his admitting physician.  Since he has remained stable during his admission, we agreed to discharge him on oral Augmentin with close follow-up.  He will follow-up with me Monday but his wife knows to call our office if anything worsens between now and then.   LOS: 3 days   Kamiah Fite D 04/12/2011, 8:47 AM

## 2011-04-12 NOTE — Discharge Summary (Signed)
PATIENT DETAILS Name: Daniel [name] Age: 55 y.o. Sex: male Date of Birth: 02-Jan-1956 MRN: [id number]. Admit Date: 04/09/2011 Admitting Physician: Jeoffrey Massed PCP:No primary provider on file.  PRIMARY DISCHARGE DIAGNOSIS:  Principal Problem:  *Submental cellulitis/Lymphadenitis Active Problems:  HTN (hypertension)  DM (diabetes mellitus)-diet controlled      PAST MEDICAL HISTORY: Past Medical History  Diagnosis Date  . Hypertension     DISCHARGE MEDICATIONS: Current Discharge Medication List    START taking these medications   Details  acetaminophen (TYLENOL) 325 MG tablet Take 2 tablets (650 mg total) by mouth every 6 (six) hours as needed (or Fever >/= 101). Qty: 30 tablet    amoxicillin-clavulanate (AUGMENTIN) 875-125 MG per tablet Take 1 tablet by mouth 2 (two) times daily. Qty: 20 tablet, Refills: 0      CONTINUE these medications which have NOT CHANGED   Details  Aspirin-Salicylamide-Caffeine (BC HEADACHE POWDER PO) Take 1 packet by mouth daily as needed. For pain     lisinopril (PRINIVIL,ZESTRIL) 40 MG tablet Take 40 mg by mouth daily.           BRIEF HPI:  See H&P, Labs, Consult and Test reports for all details in brief, patient was admitted for and swelling of his submental area. The CT scan of the did not show any abscess, and he was then consulted. Patient was placed on IV Unasyn. For further details please see the history and physical that was dictated on admission.  CONSULTATIONS:   #1 ENT  PERTINENT RADIOLOGIC STUDIES: Dg Orthopantogram  04/11/2011  *RADIOLOGY REPORT*  Clinical Data: Dental disease.  DG ORTHOPANTOGRAM/PANORAMIC  Comparison: CT 04/09/2011  Findings: There is no residual maxillary dentition.  There are several remaining mandibular teeth.  I do not see any advanced caries, but there is diffuse periodontal disease with peri root lucency, particularly evident affecting teeth numbers 18, 21, 24 and 31.  No evidence of intraosseous  abscess.  IMPRESSION: Considerable periodontal disease affecting the residual mandibular dentition as outlined above.  Original Report Authenticated By: Thomasenia Sales, M.D.   Ct Soft Tissue Neck W Contrast  04/09/2011  *RADIOLOGY REPORT*  Clinical Data: Left neck swelling.  Assess for abscess.  CT NECK WITH CONTRAST  Technique:  Multidetector CT imaging of the neck was performed with intravenous contrast.  Contrast: 75mL OMNIPAQUE IOHEXOL 300 MG/ML IV SOLN  Comparison: None.  Findings: Lung apices are clear.  No superior mediastinal pathology.  Limited visualization of the intracranial contents does not show any abnormality.  Both parotid glands are normal.  There are slightly prominent lymph nodes adjacent to the posterior margins of the parotid glands. There are slightly prominent lymph nodes throughout the neck bilaterally including levels one, II, III and four.  None of these lymph nodes exceeds 12 mm in diameter.  None shows suppuration. There may be very slight subcutaneous edema that could go along with mild cellulitis.  This appears to be bilateral.  I do not see any evidence of abscess or advanced inflammatory disease.  No mucosal or submucosal lesion is seen.  No evidence of mass lesion. Arterial and venous structures are patent.  The thyroid gland and submandibular glands are normal.  IMPRESSION: Mild subcutaneous edema suggesting mild cellulitis.  No evidence of abscess or advanced to a soft tissue inflammation.  Mild reactive lymphadenopathy on both sides of the neck.  No large / dominant nodes or suppurative nodes.  Original Report Authenticated By: Thomasenia Sales, M.D.     PERTINENT LAB  RESULTS: CBC:  Basename 04/11/11 0720 04/10/11 0712  WBC 5.2 5.2  HGB 13.7 12.8*  HCT 41.0 39.6  PLT 191 168   CMET CMP     Component Value Date/Time   NA 139 04/10/2011 0712   K 4.2 04/10/2011 0712   CL 104 04/10/2011 0712   CO2 27 04/10/2011 0712   GLUCOSE 116* 04/10/2011 0712   BUN 10  04/10/2011 0712   CREATININE 1.11 04/10/2011 0712   CALCIUM 9.1 04/10/2011 0712   GFRNONAA 73* 04/10/2011 0712   GFRAA 85* 04/10/2011 0712    GFR Estimated Creatinine Clearance: 84.2 ml/min (by C-G formula based on Cr of 1.11).  Basename 04/09/11 1319  HGBA1C 5.7*   Coags:  Basename 04/09/11 1318  INR 0.91   Microbiology: No results found for this or any previous visit (from the past 240 hour(s)).   BRIEF HOSPITAL COURSE:   Principal Problem:  *Submental cellulitis/lymphadenitis. -As noted in the history of present illness was admitted with swelling and induration along with pain in the submental area. -CT scan of the soft tissue area did not show any abscess. -Patient was empirically started on vancomycin and Unasyn, however vancomycin was discontinued patient was continued on Unasyn. -ENT was also consulted. -Patient's clinical course was closely followed, over the past few days his indurated area has not worsened, if at all,it has decreased in size somewhat with decreased tenderness as well. -At no point during the hospital stay patient had stridor or difficulty breathing or trismus. -I did speak with oral surgery over the phone, they did recommend Panoramic view x-ray, which did not show any abscess the patient tells me that he did see neurosurgery last night, however there is no formal documentation. -Patient was seen by Dr. Jenne Pane of ENT again today, Dr. Jenne Pane did review the radiologic studies with radiology again. Dr. Jenne Pane been advised that the patient could be discharged with Augmentin for another 10 more days. Patient will followup with Dr. Jenne Pane next Monday. Further management of this will be deferred to Dr. Jenne Pane. -Patient has been instructed by me personally to return to the emergency room if he were to have high fevers, difficulty breathing, swallowing and opening his mouth. Both he and his wife came understanding.  Active Problems:  HTN (hypertension) -This is  stable continue with lisinopril   DM (diabetes mellitus) -Claims he has diet-controlled diabetes, however his HbA1c is 5.7.   TODAY-DAY OF DISCHARGE:  Subjective:   Cinsere Mizrahi today has no headache,no chest abdominal pain,no new weakness tingling or numbness, feels much better wants to go home today. He does not have shortness of breath, trismus or difficulty swallowing.  Objective:   Blood pressure 136/100, pulse 82, temperature 98.5 F (36.9 C), temperature source Oral, resp. rate 20, height 5\' 8"  (1.727 m), weight 95.255 kg (210 lb), SpO2 97.00%.  Intake/Output Summary (Last 24 hours) at 04/12/11 1007 Last data filed at 04/12/11 0500  Gross per 24 hour  Intake    460 ml  Output      6 ml  Net    454 ml    Exam Awake Alert, Oriented *3, No new F.N deficits, Normal affect [place].AT,PERRAL Supple Neck,No JVD, almost unchanged submental area induration, somewhat less tender compared to the past few days.  Symmetrical Chest wall movement, Good air movement bilaterally, CTAB RRR,No Gallops,Rubs or new Murmurs, No Parasternal Heave +ve B.Sounds, Abd Soft, Non tender, No organomegaly appriciated, No rebound -guarding or rigidity. No Cyanosis, Clubbing or edema, No  new Rash or bruise  DISPOSITION: Discharge home  DISCHARGE INSTRUCTIONS:    Follow-up Information    Follow up with BATES,DWIGHT D. Make an appointment on 04/16/2011.   Contact information:   [facility] [address], [address] [place] [address] [phone]       Make an appointment in 2 weeks to follow up. (Information and application for the [facility] in [place] given to patient.) -For continued health screen and primary care needs.          Total Time spent on discharge equals 45 minutes.  SignedJeoffrey Massed 04/12/2011 10:07 AM

## 2011-04-12 NOTE — Progress Notes (Signed)
ANTIBIOTIC CONSULT NOTE - Follow Up  Pharmacy Consult for Unasyn Indication:   Facial cellulitis  No Known Allergies  Patient Measurements: Height: 5\' 8"  (172.7 cm) Weight: 210 lb (95.255 kg) IBW/kg (Calculated) : 68.4  Dosing Weight: 95 kg  Vital Signs: Temp: 98.5 F (36.9 C) (11/15 0500) Temp src: Oral (11/15 0500) BP: 136/100 mmHg (11/15 0500) Pulse Rate: 82  (11/15 0500)   Labs:  Basename 04/11/11 0720 04/10/11 0712 04/09/11 1711 04/09/11 1024  WBC 5.2 5.2 6.8 --  HGB 13.7 12.8* 14.0 --  PLT 191 168 179 --  LABCREA -- -- -- --  CREATININE -- 1.11 1.08 1.23   Estimated Creatinine Clearance: 84.2 ml/min (by C-G formula based on Cr of 1.11).   Microbiology:  No cultures  Medical History: Past Medical History  Diagnosis Date  . Hypertension    Diet-controlled DM  Smoker  Assessment: 55 yo M on D3 Unasyn for cellulitis / lymphadenitis. Unasyn dosing is appropriate for renal function.   Plan:  1. Continue Unasyn at current dosing.  2. F/u length of therapy  Emeline Gins 04/12/2011,8:23 AM

## 2012-12-16 ENCOUNTER — Emergency Department: Payer: Self-pay | Admitting: Emergency Medicine

## 2012-12-16 LAB — CBC WITH DIFFERENTIAL/PLATELET
Basophil #: 0.1 10*3/uL (ref 0.0–0.1)
Basophil %: 0.7 %
HCT: 43.5 % (ref 40.0–52.0)
HGB: 15.2 g/dL (ref 13.0–18.0)
Monocyte #: 0.7 x10 3/mm (ref 0.2–1.0)
Neutrophil #: 7.7 10*3/uL — ABNORMAL HIGH (ref 1.4–6.5)
Neutrophil %: 81.3 %
RDW: 14.2 % (ref 11.5–14.5)
WBC: 9.4 10*3/uL (ref 3.8–10.6)

## 2012-12-16 LAB — COMPREHENSIVE METABOLIC PANEL
BUN: 8 mg/dL (ref 7–18)
Co2: 27 mmol/L (ref 21–32)
Creatinine: 0.95 mg/dL (ref 0.60–1.30)
EGFR (African American): >60
Osmolality: 268 (ref 275–301)
Potassium: 3.6 mmol/L (ref 3.5–5.1)
SGOT(AST): 43 U/L — ABNORMAL HIGH (ref 15–37)
SGPT (ALT): 23 U/L (ref 12–78)
Sodium: 134 mmol/L — ABNORMAL LOW (ref 136–145)
Total Protein: 9 g/dL — ABNORMAL HIGH (ref 6.4–8.2)

## 2012-12-16 LAB — URINALYSIS, COMPLETE
Bilirubin,UR: NEGATIVE
Blood: NEGATIVE
Ketone: NEGATIVE
Nitrite: NEGATIVE
Ph: 6 (ref 4.5–8.0)
Specific Gravity: 1.018 (ref 1.003–1.030)
Squamous Epithelial: NONE SEEN

## 2013-06-17 ENCOUNTER — Emergency Department: Payer: Self-pay | Admitting: Emergency Medicine

## 2013-06-17 LAB — COMPREHENSIVE METABOLIC PANEL
ALK PHOS: 56 U/L
Albumin: 3.7 g/dL (ref 3.4–5.0)
Anion Gap: 7 (ref 7–16)
BILIRUBIN TOTAL: 1 mg/dL (ref 0.2–1.0)
BUN: 12 mg/dL (ref 7–18)
CALCIUM: 8.7 mg/dL (ref 8.5–10.1)
Chloride: 110 mmol/L — ABNORMAL HIGH (ref 98–107)
Co2: 24 mmol/L (ref 21–32)
Creatinine: 1.3 mg/dL (ref 0.60–1.30)
EGFR (African American): >60
GLUCOSE: 137 mg/dL — AB (ref 65–99)
Osmolality: 283 (ref 275–301)
POTASSIUM: 3.8 mmol/L (ref 3.5–5.1)
SGOT(AST): 27 U/L (ref 15–37)
SGPT (ALT): 27 U/L (ref 12–78)
Sodium: 141 mmol/L (ref 136–145)
Total Protein: 7.7 g/dL (ref 6.4–8.2)

## 2013-06-17 LAB — CBC
HCT: 42.5 % (ref 40.0–52.0)
HGB: 14.1 g/dL (ref 13.0–18.0)
MCH: 27.3 pg (ref 26.0–34.0)
MCHC: 33.2 g/dL (ref 32.0–36.0)
MCV: 82 fL (ref 80–100)
Platelet: 146 10*3/uL — ABNORMAL LOW (ref 150–440)
RBC: 5.17 10*6/uL (ref 4.40–5.90)
RDW: 13.6 % (ref 11.5–14.5)
WBC: 3.6 10*3/uL — ABNORMAL LOW (ref 3.8–10.6)

## 2013-06-17 LAB — TROPONIN I
Troponin-I: <0.02 ng/mL
Troponin-I: <0.02 ng/mL

## 2013-06-17 LAB — CK TOTAL AND CKMB (NOT AT ARMC)
CK, Total: 369 U/L — ABNORMAL HIGH (ref 35–232)
CK-MB: 2.4 ng/mL (ref 0.5–3.6)

## 2013-06-17 LAB — PROTIME-INR
INR: 0.9
Prothrombin Time: 11.9 secs (ref 11.5–14.7)

## 2013-06-17 LAB — LIPASE, BLOOD: LIPASE: 179 U/L (ref 73–393)

## 2013-06-17 LAB — APTT: Activated PTT: 28.4 secs (ref 23.6–35.9)

## 2013-10-04 ENCOUNTER — Inpatient Hospital Stay: Payer: Self-pay | Admitting: Surgery

## 2013-10-04 LAB — URINALYSIS, COMPLETE
BACTERIA: NONE SEEN
BILIRUBIN, UR: NEGATIVE
BLOOD: NEGATIVE
Glucose,UR: NEGATIVE mg/dL (ref 0–75)
KETONE: NEGATIVE
Leukocyte Esterase: NEGATIVE
Nitrite: NEGATIVE
Ph: 5 (ref 4.5–8.0)
Protein: NEGATIVE
RBC,UR: <1 /HPF (ref 0–5)
SQUAMOUS EPITHELIAL: NONE SEEN
Specific Gravity: 1.057 (ref 1.003–1.030)
WBC UR: NONE SEEN /HPF (ref 0–5)

## 2013-10-04 LAB — COMPREHENSIVE METABOLIC PANEL
ALK PHOS: 61 U/L
Albumin: 3 g/dL — ABNORMAL LOW (ref 3.4–5.0)
Anion Gap: 9 (ref 7–16)
Bilirubin,Total: 0.8 mg/dL (ref 0.2–1.0)
Chloride: 107 mmol/L (ref 98–107)
Co2: 21 mmol/L (ref 21–32)
Creatinine: 0.52 mg/dL — ABNORMAL LOW (ref 0.60–1.30)
EGFR (African American): >60
EGFR (Non-African Amer.): >60
Glucose: 175 mg/dL — ABNORMAL HIGH (ref 65–99)
Osmolality: 274 (ref 275–301)
Potassium: 3.6 mmol/L (ref 3.5–5.1)
SGOT(AST): 37 U/L (ref 15–37)
Sodium: 137 mmol/L (ref 136–145)

## 2013-10-04 LAB — CBC
HCT: 38.5 % — ABNORMAL LOW (ref 40.0–52.0)
HGB: 13.5 g/dL (ref 13.0–18.0)
MCH: 28.4 pg (ref 26.0–34.0)
MCHC: 35.1 g/dL (ref 32.0–36.0)
MCV: 81 fL (ref 80–100)
PLATELETS: 154 10*3/uL (ref 150–440)
RBC: 4.75 10*6/uL (ref 4.40–5.90)
RDW: 14 % (ref 11.5–14.5)
WBC: 5.6 10*3/uL (ref 3.8–10.6)

## 2013-10-04 LAB — ETHANOL

## 2013-10-05 LAB — CBC WITH DIFFERENTIAL/PLATELET
Basophil #: 0 10*3/uL (ref 0.0–0.1)
Basophil %: 0.5 %
Eosinophil #: 0 10*3/uL (ref 0.0–0.7)
Eosinophil %: 0.7 %
HCT: 36.6 % — ABNORMAL LOW (ref 40.0–52.0)
HGB: 12 g/dL — AB (ref 13.0–18.0)
Lymphocyte #: 1 10*3/uL (ref 1.0–3.6)
Lymphocyte %: 16.8 %
MCH: 26.7 pg (ref 26.0–34.0)
MCHC: 32.7 g/dL (ref 32.0–36.0)
MCV: 82 fL (ref 80–100)
Monocyte #: 0.5 x10 3/mm (ref 0.2–1.0)
Monocyte %: 8.4 %
NEUTROS PCT: 73.6 %
Neutrophil #: 4.3 10*3/uL (ref 1.4–6.5)
PLATELETS: 150 10*3/uL (ref 150–440)
RBC: 4.49 10*6/uL (ref 4.40–5.90)
RDW: 14.3 % (ref 11.5–14.5)
WBC: 5.9 10*3/uL (ref 3.8–10.6)

## 2013-10-05 LAB — COMPREHENSIVE METABOLIC PANEL
ANION GAP: 7 (ref 7–16)
AST: 66 U/L — AB (ref 15–37)
Albumin: 3.2 g/dL — ABNORMAL LOW (ref 3.4–5.0)
Alkaline Phosphatase: 56 U/L
BUN: 10 mg/dL (ref 7–18)
Bilirubin,Total: 0.7 mg/dL (ref 0.2–1.0)
CALCIUM: 7.9 mg/dL — AB (ref 8.5–10.1)
CHLORIDE: 111 mmol/L — AB (ref 98–107)
CREATININE: 0.92 mg/dL (ref 0.60–1.30)
Co2: 24 mmol/L (ref 21–32)
EGFR (African American): >60
EGFR (Non-African Amer.): >60
GLUCOSE: 126 mg/dL — AB (ref 65–99)
Osmolality: 284 (ref 275–301)
Potassium: 4.3 mmol/L (ref 3.5–5.1)
SGPT (ALT): 29 U/L (ref 12–78)
Sodium: 142 mmol/L (ref 136–145)
TOTAL PROTEIN: 7 g/dL (ref 6.4–8.2)

## 2013-10-05 LAB — PROTIME-INR
INR: 1
PROTHROMBIN TIME: 12.9 s (ref 11.5–14.7)

## 2013-10-05 LAB — APTT: Activated PTT: 26.5 secs (ref 23.6–35.9)

## 2013-10-05 LAB — AMYLASE: Amylase: 36 U/L (ref 25–115)

## 2014-01-25 ENCOUNTER — Emergency Department: Payer: Self-pay | Admitting: Emergency Medicine

## 2014-01-25 LAB — BASIC METABOLIC PANEL
ANION GAP: 10 (ref 7–16)
BUN: 8 mg/dL (ref 7–18)
CREATININE: 1.11 mg/dL (ref 0.60–1.30)
Calcium, Total: 9 mg/dL (ref 8.5–10.1)
Chloride: 105 mmol/L (ref 98–107)
Co2: 25 mmol/L (ref 21–32)
EGFR (African American): >60
GLUCOSE: 130 mg/dL — AB (ref 65–99)
OSMOLALITY: 279 (ref 275–301)
Potassium: 3.3 mmol/L — ABNORMAL LOW (ref 3.5–5.1)
Sodium: 140 mmol/L (ref 136–145)

## 2014-01-25 LAB — CBC
HCT: 42.5 % (ref 40.0–52.0)
HGB: 13.9 g/dL (ref 13.0–18.0)
MCH: 27.2 pg (ref 26.0–34.0)
MCHC: 32.6 g/dL (ref 32.0–36.0)
MCV: 83 fL (ref 80–100)
Platelet: 142 10*3/uL — ABNORMAL LOW (ref 150–440)
RBC: 5.09 10*6/uL (ref 4.40–5.90)
RDW: 15 % — ABNORMAL HIGH (ref 11.5–14.5)
WBC: 3.1 10*3/uL — ABNORMAL LOW (ref 3.8–10.6)

## 2014-01-25 LAB — TROPONIN I: Troponin-I: <0.02 ng/mL

## 2014-05-28 DIAGNOSIS — I639 Cerebral infarction, unspecified: Secondary | ICD-10-CM

## 2014-05-28 HISTORY — DX: Cerebral infarction, unspecified: I63.9
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
## 2014-10-27 ENCOUNTER — Encounter: Payer: Self-pay | Admitting: *Deleted

## 2014-10-27 ENCOUNTER — Inpatient Hospital Stay (HOSPITAL_COMMUNITY)
Admit: 2014-10-27 | Discharge: 2014-10-27 | Disposition: A | Discharge status: Home / Self Care | Payer: No Typology Code available for payment source | Attending: Internal Medicine | Admitting: Internal Medicine

## 2014-10-27 ENCOUNTER — Emergency Department: Payer: No Typology Code available for payment source

## 2014-10-27 ENCOUNTER — Inpatient Hospital Stay: Payer: No Typology Code available for payment source

## 2014-10-27 ENCOUNTER — Inpatient Hospital Stay
Admission: EM | Admit: 2014-10-27 | Discharge: 2014-11-02 | DRG: 065 | Payer: No Typology Code available for payment source | Attending: Specialist | Admitting: Specialist

## 2014-10-27 DIAGNOSIS — E785 Hyperlipidemia, unspecified: Secondary | ICD-10-CM | POA: Diagnosis present

## 2014-10-27 DIAGNOSIS — E780 Pure hypercholesterolemia: Secondary | ICD-10-CM | POA: Diagnosis present

## 2014-10-27 DIAGNOSIS — G8191 Hemiplegia, unspecified affecting right dominant side: Secondary | ICD-10-CM | POA: Diagnosis present

## 2014-10-27 DIAGNOSIS — F17210 Nicotine dependence, cigarettes, uncomplicated: Secondary | ICD-10-CM | POA: Diagnosis present

## 2014-10-27 DIAGNOSIS — I639 Cerebral infarction, unspecified: Principal | ICD-10-CM | POA: Diagnosis present

## 2014-10-27 DIAGNOSIS — R531 Weakness: Secondary | ICD-10-CM

## 2014-10-27 DIAGNOSIS — R1310 Dysphagia, unspecified: Secondary | ICD-10-CM | POA: Diagnosis present

## 2014-10-27 DIAGNOSIS — E781 Pure hyperglyceridemia: Secondary | ICD-10-CM | POA: Diagnosis present

## 2014-10-27 DIAGNOSIS — E119 Type 2 diabetes mellitus without complications: Secondary | ICD-10-CM | POA: Diagnosis present

## 2014-10-27 DIAGNOSIS — Z79899 Other long term (current) drug therapy: Secondary | ICD-10-CM | POA: Diagnosis not present

## 2014-10-27 DIAGNOSIS — I10 Essential (primary) hypertension: Secondary | ICD-10-CM | POA: Diagnosis present

## 2014-10-27 DIAGNOSIS — B370 Candidal stomatitis: Secondary | ICD-10-CM | POA: Diagnosis not present

## 2014-10-27 HISTORY — DX: Acute pancreatitis without necrosis or infection, unspecified: K85.90

## 2014-10-27 HISTORY — DX: Type 2 diabetes mellitus without complications: E11.9

## 2014-10-27 LAB — COMPREHENSIVE METABOLIC PANEL
ALK PHOS: 54 U/L (ref 38–126)
ALT: 9 U/L — ABNORMAL LOW (ref 17–63)
ANION GAP: 7 (ref 5–15)
AST: 30 U/L (ref 15–41)
Albumin: 3.8 g/dL (ref 3.5–5.0)
BUN: 8 mg/dL (ref 6–20)
CHLORIDE: 109 mmol/L (ref 101–111)
CO2: 24 mmol/L (ref 22–32)
Calcium: 8.8 mg/dL — ABNORMAL LOW (ref 8.9–10.3)
Creatinine, Ser: 1.26 mg/dL — ABNORMAL HIGH (ref 0.61–1.24)
GFR calc non Af Amer: >60 mL/min (ref >60)
GLUCOSE: 136 mg/dL — AB (ref 65–99)
Potassium: 3.6 mmol/L (ref 3.5–5.1)
Sodium: 140 mmol/L (ref 135–145)
Total Bilirubin: 1.6 mg/dL — ABNORMAL HIGH (ref 0.3–1.2)
Total Protein: 7.1 g/dL (ref 6.5–8.1)

## 2014-10-27 LAB — CBC
HCT: 40.5 % (ref 40.0–52.0)
Hemoglobin: 13.8 g/dL (ref 13.0–18.0)
MCH: 27.8 pg (ref 26.0–34.0)
MCHC: 34 g/dL (ref 32.0–36.0)
MCV: 81.8 fL (ref 80.0–100.0)
PLATELETS: 175 10*3/uL (ref 150–440)
RBC: 4.95 MIL/uL (ref 4.40–5.90)
RDW: 14.6 % — ABNORMAL HIGH (ref 11.5–14.5)
WBC: 4 10*3/uL (ref 3.8–10.6)

## 2014-10-27 LAB — GLUCOSE, CAPILLARY: Glucose-Capillary: 135 mg/dL — ABNORMAL HIGH (ref 65–99)

## 2014-10-27 LAB — TROPONIN I

## 2014-10-27 MED ORDER — ASPIRIN 300 MG RE SUPP
RECTAL | Status: AC
  Administered 2014-10-27: 300 mg via RECTAL
  Filled 2014-10-27: qty 1

## 2014-10-27 MED ORDER — STROKE: EARLY STAGES OF RECOVERY BOOK
Freq: Once | Status: AC
  Administered 2014-10-27: 12:00:00

## 2014-10-27 MED ORDER — MELOXICAM 7.5 MG PO TABS
15.0000 mg | ORAL_TABLET | Freq: Every day | ORAL | Status: DC
  Administered 2014-10-28: 15 mg via ORAL
  Filled 2014-10-27: qty 2

## 2014-10-27 MED ORDER — ASPIRIN 300 MG RE SUPP
300.0000 mg | Freq: Once | RECTAL | Status: AC
  Administered 2014-10-27: 300 mg via RECTAL

## 2014-10-27 MED ORDER — ENOXAPARIN SODIUM 40 MG/0.4ML ~~LOC~~ SOLN
40.0000 mg | SUBCUTANEOUS | Status: DC
  Administered 2014-10-27 – 2014-11-01 (×6): 40 mg via SUBCUTANEOUS
  Filled 2014-10-27 (×6): qty 0.4

## 2014-10-27 MED ORDER — ASPIRIN 325 MG PO TABS
325.0000 mg | ORAL_TABLET | Freq: Every day | ORAL | Status: DC
  Administered 2014-10-28 – 2014-11-01 (×5): 325 mg via ORAL
  Filled 2014-10-27 (×8): qty 1

## 2014-10-27 MED ORDER — GEMFIBROZIL 600 MG PO TABS
600.0000 mg | ORAL_TABLET | Freq: Two times a day (BID) | ORAL | Status: DC
  Administered 2014-10-28 – 2014-11-01 (×8): 600 mg via ORAL
  Filled 2014-10-27 (×8): qty 1

## 2014-10-27 MED ORDER — PRAVASTATIN SODIUM 20 MG PO TABS
80.0000 mg | ORAL_TABLET | Freq: Every day | ORAL | Status: DC
  Filled 2014-10-27: qty 4

## 2014-10-27 MED ORDER — ASPIRIN 300 MG RE SUPP: 300.0000 mg | Freq: Every day | RECTAL | Status: DC

## 2014-10-27 MED ORDER — SENNOSIDES-DOCUSATE SODIUM 8.6-50 MG PO TABS
1.0000 | ORAL_TABLET | Freq: Every evening | ORAL | Status: DC | PRN
  Administered 2014-10-30 – 2014-10-31 (×2): 1 via ORAL
  Filled 2014-10-27 (×2): qty 1

## 2014-10-27 MED ORDER — IOHEXOL 350 MG/ML SOLN
80.0000 mL | Freq: Once | INTRAVENOUS | Status: AC | PRN
  Administered 2014-10-27: 80 mL via INTRAVENOUS

## 2014-10-27 NOTE — Progress Notes (Signed)
Physical Therapy Evaluation Patient Details Name: Daniel [name] MRN: [id number] DOB: 03-31-1956 Today's Date: 10/27/2014   History of Present Illness  Pt admitted for R MCA CVA.   Clinical Impression  Pt is a pleasant 59 year old male who was admitted for acute CVA. Pt performs bed mobility with mod +2 assist and requires max + 2 assist to maintain seated balance with heavy L lateral leaning. Pt motivated to perform therapy despite lethargy from multiple testing this date. Unable to perform further mobility at this time secondary to balance deficits. Pt with supportive family in room. Pt demonstrates deficits with balance/strength/mobility/coordination. Would benefit from skilled PT to address above deficits and promote optimal return to PLOF. Pt would benefit from inpatient rehab as pt would benefit from multiple therapy disciplines and has great rehab potential and motivation.     Follow Up Recommendations CIR    Equipment Recommendations       Recommendations for Other Services Rehab consult     Precautions / Restrictions Precautions Precautions: None Restrictions Weight Bearing Restrictions: No      Mobility  Bed Mobility Overal bed mobility: Needs Assistance;+2 for physical assistance             General bed mobility comments: supine<>Sit with mod assist + 2. Heavy assist required as pt pushes to the left. Unable to maintain seated balance.   Transfers                 General transfer comment: not performed as pt unable to maintain sitting balance without max assist +2.  Ambulation/Gait             General Gait Details: unable at this time.  Stairs            Wheelchair Mobility    Modified Rankin (Stroke Patients Only)       Balance Overall balance assessment: Needs assistance Sitting-balance support: Feet supported Sitting balance-Leahy Scale: Poor Sitting balance - Comments: heavy listing to the L Postural control: Left lateral  lean                                   Pertinent Vitals/Pain Pain Assessment: No/denies pain    Home Living Family/patient expects to be discharged to:: Inpatient rehab                      Prior Function Level of Independence: Independent         Comments: currently driving and working     Hand Dominance        Extremity/Trunk Assessment   Upper Extremity Assessment: Generalized weakness;LUE deficits/detail       LUE Deficits / Details: grossly 3/5 in shoulder flexion, decreased grip strength noted.   Lower Extremity Assessment: Generalized weakness;LLE deficits/detail   LLE Deficits / Details: L LE grossly 2/5 as unable to SLR at this time. Does show trace dorsiflexion     Communication   Communication: Expressive difficulties  Cognition Arousal/Alertness: Lethargic Behavior During Therapy: WFL for tasks assessed/performed Overall Cognitive Status: Within Functional Limits for tasks assessed                      General Comments      Exercises        Assessment/Plan    PT Assessment Patient needs continued PT services  PT Diagnosis Difficulty walking (hemiplegia)   PT Problem  List Decreased strength;Decreased range of motion;Decreased balance;Decreased mobility;Decreased coordination  PT Treatment Interventions Gait training;Therapeutic exercise;Neuromuscular re-education   PT Goals (Current goals can be found in the Care Plan section) Acute Rehab PT Goals Patient Stated Goal: unable to state goals at this time PT Goal Formulation: With patient/family Time For Goal Achievement: 11/10/14 Potential to Achieve Goals: Good    Frequency 7X/week   Barriers to discharge        Co-evaluation               End of Session   Activity Tolerance: Patient tolerated treatment well Patient left: in bed;with bed alarm set Nurse Communication: Mobility status         Time: 5056-9794 PT Time Calculation (min)  (ACUTE ONLY): 19 min   Charges:   PT Evaluation $Initial PT Evaluation Tier I: 1 Procedure     PT G Codes:        Denesha Brouse 11/24/14, 3:57 PM  Greggory Stallion, PT, DPT [phone]

## 2014-10-27 NOTE — Care Management Note (Signed)
Case Management Note  Patient Details  Name: ARAGORN RECKER MRN: [id number] Date of Birth: 10/25/1955   Mr Zagal remains unresponsive to verbal stimuli. Still unable to locate family who were at bedside this morning. Mr Epperly was relocated to a room closer to the nurses station for closer monitoring.                      Expected Discharge Date:                  Expected Discharge Plan:     In-House Referral:     Discharge planning Services     Post Acute Care Choice:    Choice offered to:     DME Arranged:    DME Agency:     HH Arranged:    [place] Agency:     Status of Service:     Medicare Important Message Given:    Date Medicare IM Given:    Medicare IM give by:    Date Additional Medicare IM Given:    Additional Medicare Important Message give by:     If discussed at [place] of Stay Meetings, dates discussed:    Additional Comments:  Marithza Malachi A, RN 10/27/2014, 3:43 PM

## 2014-10-27 NOTE — Progress Notes (Signed)
*  PRELIMINARY RESULTS* Echocardiogram 2D Echocardiogram has been performed.  Daniel [name] 10/27/2014, 1:30 PM

## 2014-10-27 NOTE — ED Notes (Signed)
Tele MD discussing option of TPA with family.

## 2014-10-27 NOTE — ED Notes (Signed)
Tele-stroke on [facility] with family at bedside.

## 2014-10-27 NOTE — ED Notes (Signed)
Patient was up getting ready for work as usual, was in the kitchen "for a bit" and went back into the bedroom where his wife heard him fall. Patient arrives unable to follow commands, right sided mouth droop, constant movement of right index finger and no movement on left side.

## 2014-10-27 NOTE — ED Notes (Signed)
Tele MD reccommended and offered thrombolytics, spouse refused and wanted pt "admitted" and treated.  Pt's spouse did not want "clot buster" given.  Tele MD stressed window of time to family members for thrombolytics and spouse verbally acknowledged.  Spouse continues to refuse thrombolytics.  Tele MD advises family she will discuss spouses wishes with MD Timoteo Expose in the ED.

## 2014-10-27 NOTE — Care Management Note (Signed)
Case Management Note  Patient Details  Name: REFOEL PALLADINO MRN: [id number] Date of Birth: 27-Oct-1955  Subjective/Objective:      Just returned from ECHO. Not responding to verbal stimuli. Wife not in Family Waiting Room, unable to locate her at this time.               Action/Plan:   Expected Discharge Date:                  Expected Discharge Plan:     In-House Referral:     Discharge planning Services     Post Acute Care Choice:    Choice offered to:     DME Arranged:    DME Agency:     HH Arranged:    [place] Agency:     Status of Service:     Medicare Important Message Given:    Date Medicare IM Given:    Medicare IM give by:    Date Additional Medicare IM Given:    Additional Medicare Important Message give by:     If discussed at [place] of Stay Meetings, dates discussed:    Additional Comments:  Monee Dembeck A, RN 10/27/2014, 2:09 PM

## 2014-10-27 NOTE — H&P (Signed)
[facility] NAME: Daniel [name]    MR#:  [id number]  DATE OF BIRTH:  [birth year]  DATE OF ADMISSION:  10/27/2014  PRIMARY CARE PHYSICIAN: Marden Noble, MD   REQUESTING/REFERRING PHYSICIAN: Dr Lenise Arena  CHIEF COMPLAINT:   Chief Complaint  Patient presents with  . Altered Mental Status    HISTORY OF PRESENT ILLNESS:  Daniel [name]  is a 59 y.o. male with a known history of hypercholesterolemia, HTN not well controlled, stopped taking his medications admitted with L sided weakness. Pt was found down with last seen normal around 4 AM. Pt's family refused TPA and was not candidate for intervention/clot retrieval post CTA. Was not on ASA at home and was given it rectally in ED.  PAST MEDICAL HISTORY:   Past Medical History  Diagnosis Date  . Hypertension   . Pancreatitis   . Diabetes mellitus without complication     PAST SURGICAL HISTORY:  NONE  SOCIAL HISTORY:   History  Substance Use Topics  . Smoking status: Current Every Day Smoker  . Smokeless tobacco: Not on file  . Alcohol Use: Yes    FAMILY HISTORY:  MOTHER: DM  DRUG ALLERGIES:  No Known Allergies  REVIEW OF SYSTEMS:   Review of Systems  Unable to perform ROS: patient unresponsive   MEDICATIONS AT HOME:   Prior to Admission medications   Medication Sig Start Date End Date Taking? Authorizing Provider  amLODipine (NORVASC) 10 MG tablet Take 10 mg by mouth daily.   Yes Historical Provider, MD  gemfibrozil (LOPID) 600 MG tablet Take 600 mg by mouth 2 (two) times daily before a meal.   Yes Historical Provider, MD  meloxicam (MOBIC) 15 MG tablet Take 15 mg by mouth daily.   Yes Historical Provider, MD      VITAL SIGNS:  Blood pressure 128/85, pulse 56, temperature 98.6 F (37 C), temperature source Oral, resp. rate 20, height 5\' 6"  (1.676 m), weight 74.844 kg (165 lb), SpO2 99 %.  PHYSICAL EXAMINATION:  Physical Exam   Constitutional: He is well-developed, well-nourished, and in no distress. Vital signs are normal. He appears lethargic. He has a sickly appearance.  HENT:  Head: Normocephalic and atraumatic.  Eyes: Conjunctivae and EOM are normal. Pupils are equal, round, and reactive to light.  Neck: Normal range of motion. Neck supple. No tracheal deviation present. No thyromegaly present.  Cardiovascular: Normal rate, regular rhythm and normal heart sounds.   Pulmonary/Chest: Effort normal and breath sounds normal. No respiratory distress. He has no wheezes. He exhibits no tenderness.  Abdominal: Soft. Bowel sounds are normal. He exhibits no distension. There is no tenderness.  Musculoskeletal: Normal range of motion.  Neurological: He appears lethargic. He displays weakness and abnormal speech. No cranial nerve deficit.  Dysarthric speech  Forced field deviation to R side Plegic LUE and LLE L facial droop L side neglect   Skin: Skin is warm and dry. No rash noted.  Psychiatric: Mood and affect normal.    LABORATORY PANEL:   CBC  Recent Labs Lab 10/27/14 0525  WBC 4.0  HGB 13.8  HCT 40.5  PLT 175   ------------------------------------------------------------------------------------------------------------------  Chemistries   Recent Labs Lab 10/27/14 0525  NA 140  K 3.6  CL 109  CO2 24  GLUCOSE 136*  BUN 8  CREATININE 1.26*  CALCIUM 8.8*  AST 30  ALT 9*  ALKPHOS 54  BILITOT 1.6*   ------------------------------------------------------------------------------------------------------------------  RADIOLOGY:  Ct Angio Head W/cm &/or Wo Cm  10/27/2014   CLINICAL DATA:  Altered mental status.  Right-sided weakness.  EXAM: CT ANGIOGRAPHY HEAD AND NECK  TECHNIQUE: Multidetector CT imaging of the head and neck was performed using the standard protocol during bolus administration of intravenous contrast. Multiplanar CT image reconstructions and MIPs were obtained to evaluate the  vascular anatomy. Carotid stenosis measurements (when applicable) are obtained utilizing NASCET criteria, using the distal internal carotid diameter as the denominator.  CONTRAST:  89mL OMNIPAQUE IOHEXOL 350 MG/ML SOLN  COMPARISON:  Noncontrast head CT earlier today. Soft tissue neck CT 04/09/2011.  FINDINGS: CTA NECK  Aortic arch: 3 vessel aortic arch. Brachiocephalic and subclavian arteries are widely patent.  Right carotid system: Common carotid artery is patent without stenosis. Cervical ICA is patent with mild noncalcified plaque proximally but no stenosis.  Left carotid system: Common carotid artery is patent without stenosis. Cervical ICA is patent with minimal noncalcified plaque proximally and no stenosis.  Vertebral arteries: Vertebral arteries are patent with the left being dominant. No vertebral artery stenosis is identified.  Skeleton: Mild multilevel cervical spondylosis.  Other neck: No neck mass.  CTA HEAD  Anterior circulation: Internal carotid arteries are patent from skullbase to carotid termini without stenosis. There is occlusion of the proximal to mid right M1 segment just beyond the origin of the anterior temporal artery. There is very mild collateral supply of more distal right MCA branch vessels. Left MCA is unremarkable. ACAs are unremarkable. No intracranial aneurysm is identified.  Posterior circulation: The intracranial vertebral arteries are patent to the basilar with the left being dominant. AICA origins are patent with the right being dominant. Right PICA shares a common origin with the right AICA. Left PICA is not clearly identified. SCA origins are patent. Basilar artery is patent without stenosis. There is a small patent right posterior communicating artery. PCAs are unremarkable.  Venous sinuses: Patent.  Anatomic variants: None.  Delayed phase: There is evidence of developing cytotoxic edema in the right MCA territory involving the insula and adjacent frontal, temporal, and  parietal lobes.  IMPRESSION: 1. Right M1 occlusion with evidence of developing acute ischemia in the right MCA territory. 2. No carotid artery or vertebral artery stenosis.  Critical Value/emergent results were called by telephone at the time of interpretation on 10/27/2014 at 9:11 am to Dr. Jimmye Norman, who verbally acknowledged these results.   Electronically Signed   By: Logan Bores   On: 10/27/2014 09:14   Dg Chest 2 View  10/27/2014   CLINICAL DATA:  Subsequent encounter for stroke  EXAM: CHEST  2 VIEW  COMPARISON:  01/25/2014.  FINDINGS: 1345 hours. Low volume, lordotic film without edema or focal airspace consolidation. The cardio pericardial silhouette is enlarged. Multiple old left-sided rib fractures again noted. Telemetry leads overlie the chest.  IMPRESSION: No active cardiopulmonary disease.   Electronically Signed   By: Misty Stanley M.D.   On: 10/27/2014 14:20   Ct Head Wo Contrast  10/27/2014   CLINICAL DATA:  Code stroke. Right facial droop. "No movement on the left. "  EXAM: CT HEAD WITHOUT CONTRAST  TECHNIQUE: Contiguous axial images were obtained from the base of the skull through the vertex without intravenous contrast.  COMPARISON:  10/04/2013  FINDINGS: There is a subcentimeter focal hypodensity in the right parietal cortex that was not definitively seen on prior. Question minimal subcortical hypodensity in the adjacent parietal lobe.No intracranial hemorrhage, mass effect, or midline shift. No hydrocephalus. The basilar cisterns  are patent. No intracranial fluid collection. Calvarium is intact. Minimal mucosal thickening involving the left ethmoid air cells and frontal sinus, remaining paranasal sinuses are well-aerated. Mastoid air cells are clear.  IMPRESSION: Questionable foci of acute ischemia involving the right parietal lobe. No intracranial hemorrhage.  These results were called by telephone at the time of interpretation on 10/27/2014 at 5:48 am to Dr. Charlesetta Ivory , who verbally  acknowledged these results.   Electronically Signed   By: Jeb Levering M.D.   On: 10/27/2014 05:48   Ct Angio Neck W/cm &/or Wo/cm  10/27/2014   CLINICAL DATA:  Altered mental status.  Right-sided weakness.  EXAM: CT ANGIOGRAPHY HEAD AND NECK  TECHNIQUE: Multidetector CT imaging of the head and neck was performed using the standard protocol during bolus administration of intravenous contrast. Multiplanar CT image reconstructions and MIPs were obtained to evaluate the vascular anatomy. Carotid stenosis measurements (when applicable) are obtained utilizing NASCET criteria, using the distal internal carotid diameter as the denominator.  CONTRAST:  22mL OMNIPAQUE IOHEXOL 350 MG/ML SOLN  COMPARISON:  Noncontrast head CT earlier today. Soft tissue neck CT 04/09/2011.  FINDINGS: CTA NECK  Aortic arch: 3 vessel aortic arch. Brachiocephalic and subclavian arteries are widely patent.  Right carotid system: Common carotid artery is patent without stenosis. Cervical ICA is patent with mild noncalcified plaque proximally but no stenosis.  Left carotid system: Common carotid artery is patent without stenosis. Cervical ICA is patent with minimal noncalcified plaque proximally and no stenosis.  Vertebral arteries: Vertebral arteries are patent with the left being dominant. No vertebral artery stenosis is identified.  Skeleton: Mild multilevel cervical spondylosis.  Other neck: No neck mass.  CTA HEAD  Anterior circulation: Internal carotid arteries are patent from skullbase to carotid termini without stenosis. There is occlusion of the proximal to mid right M1 segment just beyond the origin of the anterior temporal artery. There is very mild collateral supply of more distal right MCA branch vessels. Left MCA is unremarkable. ACAs are unremarkable. No intracranial aneurysm is identified.  Posterior circulation: The intracranial vertebral arteries are patent to the basilar with the left being dominant. AICA origins are patent  with the right being dominant. Right PICA shares a common origin with the right AICA. Left PICA is not clearly identified. SCA origins are patent. Basilar artery is patent without stenosis. There is a small patent right posterior communicating artery. PCAs are unremarkable.  Venous sinuses: Patent.  Anatomic variants: None.  Delayed phase: There is evidence of developing cytotoxic edema in the right MCA territory involving the insula and adjacent frontal, temporal, and parietal lobes.  IMPRESSION: 1. Right M1 occlusion with evidence of developing acute ischemia in the right MCA territory. 2. No carotid artery or vertebral artery stenosis.  Critical Value/emergent results were called by telephone at the time of interpretation on 10/27/2014 at 9:11 am to Dr. Jimmye Norman, who verbally acknowledged these results.   Electronically Signed   By: Logan Bores   On: 10/27/2014 09:14   Mr Brain Wo Contrast  10/27/2014   ADDENDUM REPORT: 10/27/2014 14:29  ADDENDUM: Study discussed by telephone with Dr. Max Sane on 10/27/2014 at 1424 hrs.   Electronically Signed   By: Genevie Ann M.D.   On: 10/27/2014 14:29   10/27/2014   CLINICAL DATA:  59 year old male who fell this morning with right facial droop and loss of left side movement. Right M1 MCA occlusion. Initial encounter.  EXAM: MRI HEAD WITHOUT CONTRAST  MRA HEAD WITHOUT  CONTRAST  TECHNIQUE: Multiplanar, multiecho pulse sequences of the brain and surrounding structures were obtained without intravenous contrast. Angiographic images of the head were obtained using MRA technique without contrast.  COMPARISON:  CTA head and neck 0817 hr today.  FINDINGS: MRI HEAD FINDINGS  Confluent restricted diffusion in the right MCA territory. Changes are most concentrated at the insula, operculum, and right basal ganglia. Estimated volume of restricted diffusion 175 mL.  Developing cytotoxic edema. Trace petechial hemorrhage (series 9, image 17). Mild mass effect on the right lateral ventricle, no  midline shift at this point.  Abnormal increased FLAIR signal in the right MCA branches. Other Major intracranial vascular flow voids are preserved.  No contralateral left hemisphere or posterior fossa restricted diffusion. Aside from the acute signal changes there are scattered small subcortical white matter foci of T2 and FLAIR hyperintensity in both hemispheres. The thalami, brainstem, and cerebellum are within normal limits.  No ventriculomegaly. Normal basilar cisterns. Negative pituitary, cervicomedullary junction and visualized cervical spine. Visible internal auditory structures appear normal. Mastoids are clear. Chronic left orbit fracture. Mild paranasal sinus mucosal thickening. Mildly dysconjugate gaze. Visualized scalp soft tissues are within normal limits. Normal bone marrow signal.  MRA HEAD FINDINGS  Antegrade flow in the posterior circulation with dominant distal left vertebral artery. Patent vertebrobasilar junction and basilar artery. Normal SCA and PCA origins. Right posterior communicating artery appears normal. Bilateral PCA branches are normal.  Antegrade flow in both ICA siphons. No siphon stenosis. Patent carotid termini. Ophthalmic artery origins are normal. Left MCA and bilateral ACA origins and visualized branches are within normal limits.  Right MCA M1 occlusion about 8 mm beyond its origin. No distal right MCA flow signal detected.  IMPRESSION: 1. Right MCA M1 occlusion re- identified, with moderate to large acute right MCA infarct (infarct core volume estimated at 175 mL). 2. Small volume petechial hemorrhage. Developing cytotoxic edema with only mild mass effect at this time (no midline shift). Noncontrast head CT can be utilized for surveillance of intracranial mass effect. 3. Otherwise negative intracranial MRA. 4. Underlying mild for age nonspecific cerebral white matter changes.  Electronically Signed: By: Genevie Ann M.D. On: 10/27/2014 14:16   Mr Jodene Nam Head/brain Wo Cm  10/27/2014    ADDENDUM REPORT: 10/27/2014 14:29  ADDENDUM: Study discussed by telephone with Dr. Max Sane on 10/27/2014 at 1424 hrs.   Electronically Signed   By: Genevie Ann M.D.   On: 10/27/2014 14:29   10/27/2014   CLINICAL DATA:  59 year old male who fell this morning with right facial droop and loss of left side movement. Right M1 MCA occlusion. Initial encounter.  EXAM: MRI HEAD WITHOUT CONTRAST  MRA HEAD WITHOUT CONTRAST  TECHNIQUE: Multiplanar, multiecho pulse sequences of the brain and surrounding structures were obtained without intravenous contrast. Angiographic images of the head were obtained using MRA technique without contrast.  COMPARISON:  CTA head and neck 0817 hr today.  FINDINGS: MRI HEAD FINDINGS  Confluent restricted diffusion in the right MCA territory. Changes are most concentrated at the insula, operculum, and right basal ganglia. Estimated volume of restricted diffusion 175 mL.  Developing cytotoxic edema. Trace petechial hemorrhage (series 9, image 17). Mild mass effect on the right lateral ventricle, no midline shift at this point.  Abnormal increased FLAIR signal in the right MCA branches. Other Major intracranial vascular flow voids are preserved.  No contralateral left hemisphere or posterior fossa restricted diffusion. Aside from the acute signal changes there are scattered small subcortical white matter  foci of T2 and FLAIR hyperintensity in both hemispheres. The thalami, brainstem, and cerebellum are within normal limits.  No ventriculomegaly. Normal basilar cisterns. Negative pituitary, cervicomedullary junction and visualized cervical spine. Visible internal auditory structures appear normal. Mastoids are clear. Chronic left orbit fracture. Mild paranasal sinus mucosal thickening. Mildly dysconjugate gaze. Visualized scalp soft tissues are within normal limits. Normal bone marrow signal.  MRA HEAD FINDINGS  Antegrade flow in the posterior circulation with dominant distal left vertebral artery.  Patent vertebrobasilar junction and basilar artery. Normal SCA and PCA origins. Right posterior communicating artery appears normal. Bilateral PCA branches are normal.  Antegrade flow in both ICA siphons. No siphon stenosis. Patent carotid termini. Ophthalmic artery origins are normal. Left MCA and bilateral ACA origins and visualized branches are within normal limits.  Right MCA M1 occlusion about 8 mm beyond its origin. No distal right MCA flow signal detected.  IMPRESSION: 1. Right MCA M1 occlusion re- identified, with moderate to large acute right MCA infarct (infarct core volume estimated at 175 mL). 2. Small volume petechial hemorrhage. Developing cytotoxic edema with only mild mass effect at this time (no midline shift). Noncontrast head CT can be utilized for surveillance of intracranial mass effect. 3. Otherwise negative intracranial MRA. 4. Underlying mild for age nonspecific cerebral white matter changes.  Electronically Signed: By: Genevie Ann M.D. On: 10/27/2014 14:16      IMPRESSION AND PLAN:   * Acute CVA: With dense left-sided paralysis his left upper extremity is completely flaccid, left lower leg is weak.  On aspirin rectally only.  2-D echo, carotid Dopplers.  MRI and MRA of the brain and neck along with CT angio been ordered.  Neurology consult.  Nothing by mouth until speech eval. Neuro check for now.  not a thrombectomy candidate.He was in TPA window, but family refused, will get PT-OT-ST eval   * htn: will let it autoregulate in acute cva phase.  * dm: ssi for now. Check hba1c.  Likely poor prognosis.  All the records are reviewed and case discussed with ED provider. Management plans discussed with the patient, family, NEUROLOGY and they are in agreement.  CODE STATUS: FULL CODE  TOTAL TIME TAKING CARE OF THIS PATIENT: 45 minutes.    [facility], Jazae Gandolfi M.D on 10/27/2014 at 4:26 PM  Between 7am to 6pm - Pager - [phone]  After 6pm go to [email or web address] - password [email or web address]  [facility]  Office  [phone]  CC: Primary care physician; Marden Noble, MD

## 2014-10-27 NOTE — Progress Notes (Signed)
Speech Therapy Note: Received order, reviewed chart notes and MR imaging results thus far. Pt is NPO. Consulted NSG and MD re: pt's presenting status currently and his high risk for aspiration at this time sec. to his presentation. MD agreed and stated to hold his BSE at this time; reassess pt's status tomorrow. SLP updated NSG and will f/u tomorrow.

## 2014-10-27 NOTE — Consult Note (Signed)
Chief Complaint: R sided weakness   HPI: Daniel [name] is an 59 y.o. male with hx of hypercholesterolemia, HTN not well controlled, stopped taking his medications admitted with L sided weakness. Pt was found down with last seen normal around 11AM.  Pt refused TPA and was not candidate for intervention/clot retrieval post CTA.  Was not on ASA at home.    Past Medical History  Diagnosis Date  . Hypertension   . Pancreatitis   . Diabetes mellitus without complication     No past surgical history on file.  No family history on file. Social History:  reports that he has been smoking.  He does not have any smokeless tobacco history on file. He reports that he drinks alcohol. He reports that he does not use illicit drugs.  Allergies: No Known Allergies  Medications: I have reviewed the patient's current medications.  ROS: Unable to obtain   Physical Examination: Blood pressure 128/85, pulse 52, temperature 98.6 F (37 C), temperature source Oral, resp. rate 20, height 5\' 6"  (1.676 m), weight 74.844 kg (165 lb), SpO2 100 %.  Dysarthric speech  Forced field deviation to R side Plegic LUE and LLE L facial droop L side neglect   Laboratory Studies:  Basic Metabolic Panel:  Recent Labs Lab 10/27/14 0525  NA 140  K 3.6  CL 109  CO2 24  GLUCOSE 136*  BUN 8  CREATININE 1.26*  CALCIUM 8.8*    Liver Function Tests:  Recent Labs Lab 10/27/14 0525  AST 30  ALT 9*  ALKPHOS 54  BILITOT 1.6*  PROT 7.1  ALBUMIN 3.8   No results for input(s): LIPASE, AMYLASE in the last 168 hours. No results for input(s): AMMONIA in the last 168 hours.  CBC:  Recent Labs Lab 10/27/14 0525  WBC 4.0  HGB 13.8  HCT 40.5  MCV 81.8  PLT 175    Cardiac Enzymes:  Recent Labs Lab 10/27/14 0525  TROPONINI <0.03    BNP: Invalid input(s): POCBNP  CBG:  Recent Labs Lab 10/27/14 0720  GLUCAP 135*    Microbiology: No results found for this or any previous  visit.  Coagulation Studies: No results for input(s): LABPROT, INR in the last 72 hours.  Urinalysis: No results for input(s): COLORURINE, LABSPEC, PHURINE, GLUCOSEU, HGBUR, BILIRUBINUR, KETONESUR, PROTEINUR, UROBILINOGEN, NITRITE, LEUKOCYTESUR in the last 168 hours.  Invalid input(s): APPERANCEUR  Lipid Panel: No results found for: CHOL, TRIG, HDL, CHOLHDL, VLDL, LDLCALC  HgbA1C:  Lab Results  Component Value Date   HGBA1C 5.7* 04/09/2011    Urine Drug Screen:  No results found for: LABOPIA, COCAINSCRNUR, LABBENZ, AMPHETMU, THCU, LABBARB  Alcohol Level: No results for input(s): ETH in the last 168 hours.  Other results: EKG: normal EKG, normal sinus rhythm, unchanged from previous tracings.  Imaging: Ct Angio Head W/cm &/or Wo Cm  10/27/2014   CLINICAL DATA:  Altered mental status.  Right-sided weakness.  EXAM: CT ANGIOGRAPHY HEAD AND NECK  TECHNIQUE: Multidetector CT imaging of the head and neck was performed using the standard protocol during bolus administration of intravenous contrast. Multiplanar CT image reconstructions and MIPs were obtained to evaluate the vascular anatomy. Carotid stenosis measurements (when applicable) are obtained utilizing NASCET criteria, using the distal internal carotid diameter as the denominator.  CONTRAST:  72mL OMNIPAQUE IOHEXOL 350 MG/ML SOLN  COMPARISON:  Noncontrast head CT earlier today. Soft tissue neck CT 04/09/2011.  FINDINGS: CTA NECK  Aortic arch: 3 vessel aortic arch. Brachiocephalic and  subclavian arteries are widely patent.  Right carotid system: Common carotid artery is patent without stenosis. Cervical ICA is patent with mild noncalcified plaque proximally but no stenosis.  Left carotid system: Common carotid artery is patent without stenosis. Cervical ICA is patent with minimal noncalcified plaque proximally and no stenosis.  Vertebral arteries: Vertebral arteries are patent with the left being dominant. No vertebral artery stenosis is  identified.  Skeleton: Mild multilevel cervical spondylosis.  Other neck: No neck mass.  CTA HEAD  Anterior circulation: Internal carotid arteries are patent from skullbase to carotid termini without stenosis. There is occlusion of the proximal to mid right M1 segment just beyond the origin of the anterior temporal artery. There is very mild collateral supply of more distal right MCA branch vessels. Left MCA is unremarkable. ACAs are unremarkable. No intracranial aneurysm is identified.  Posterior circulation: The intracranial vertebral arteries are patent to the basilar with the left being dominant. AICA origins are patent with the right being dominant. Right PICA shares a common origin with the right AICA. Left PICA is not clearly identified. SCA origins are patent. Basilar artery is patent without stenosis. There is a small patent right posterior communicating artery. PCAs are unremarkable.  Venous sinuses: Patent.  Anatomic variants: None.  Delayed phase: There is evidence of developing cytotoxic edema in the right MCA territory involving the insula and adjacent frontal, temporal, and parietal lobes.  IMPRESSION: 1. Right M1 occlusion with evidence of developing acute ischemia in the right MCA territory. 2. No carotid artery or vertebral artery stenosis.  Critical Value/emergent results were called by telephone at the time of interpretation on 10/27/2014 at 9:11 am to Dr. Jimmye Norman, who verbally acknowledged these results.   Electronically Signed   By: Logan Bores   On: 10/27/2014 09:14   Dg Chest 2 View  10/27/2014   CLINICAL DATA:  Subsequent encounter for stroke  EXAM: CHEST  2 VIEW  COMPARISON:  01/25/2014.  FINDINGS: 1345 hours. Low volume, lordotic film without edema or focal airspace consolidation. The cardio pericardial silhouette is enlarged. Multiple old left-sided rib fractures again noted. Telemetry leads overlie the chest.  IMPRESSION: No active cardiopulmonary disease.   Electronically Signed   By:  Misty Stanley M.D.   On: 10/27/2014 14:20   Ct Head Wo Contrast  10/27/2014   CLINICAL DATA:  Code stroke. Right facial droop. "No movement on the left. "  EXAM: CT HEAD WITHOUT CONTRAST  TECHNIQUE: Contiguous axial images were obtained from the base of the skull through the vertex without intravenous contrast.  COMPARISON:  10/04/2013  FINDINGS: There is a subcentimeter focal hypodensity in the right parietal cortex that was not definitively seen on prior. Question minimal subcortical hypodensity in the adjacent parietal lobe.No intracranial hemorrhage, mass effect, or midline shift. No hydrocephalus. The basilar cisterns are patent. No intracranial fluid collection. Calvarium is intact. Minimal mucosal thickening involving the left ethmoid air cells and frontal sinus, remaining paranasal sinuses are well-aerated. Mastoid air cells are clear.  IMPRESSION: Questionable foci of acute ischemia involving the right parietal lobe. No intracranial hemorrhage.  These results were called by telephone at the time of interpretation on 10/27/2014 at 5:48 am to Dr. Charlesetta Ivory , who verbally acknowledged these results.   Electronically Signed   By: Jeb Levering M.D.   On: 10/27/2014 05:48   Ct Angio Neck W/cm &/or Wo/cm  10/27/2014   CLINICAL DATA:  Altered mental status.  Right-sided weakness.  EXAM: CT ANGIOGRAPHY HEAD AND  NECK  TECHNIQUE: Multidetector CT imaging of the head and neck was performed using the standard protocol during bolus administration of intravenous contrast. Multiplanar CT image reconstructions and MIPs were obtained to evaluate the vascular anatomy. Carotid stenosis measurements (when applicable) are obtained utilizing NASCET criteria, using the distal internal carotid diameter as the denominator.  CONTRAST:  48mL OMNIPAQUE IOHEXOL 350 MG/ML SOLN  COMPARISON:  Noncontrast head CT earlier today. Soft tissue neck CT 04/09/2011.  FINDINGS: CTA NECK  Aortic arch: 3 vessel aortic arch.  Brachiocephalic and subclavian arteries are widely patent.  Right carotid system: Common carotid artery is patent without stenosis. Cervical ICA is patent with mild noncalcified plaque proximally but no stenosis.  Left carotid system: Common carotid artery is patent without stenosis. Cervical ICA is patent with minimal noncalcified plaque proximally and no stenosis.  Vertebral arteries: Vertebral arteries are patent with the left being dominant. No vertebral artery stenosis is identified.  Skeleton: Mild multilevel cervical spondylosis.  Other neck: No neck mass.  CTA HEAD  Anterior circulation: Internal carotid arteries are patent from skullbase to carotid termini without stenosis. There is occlusion of the proximal to mid right M1 segment just beyond the origin of the anterior temporal artery. There is very mild collateral supply of more distal right MCA branch vessels. Left MCA is unremarkable. ACAs are unremarkable. No intracranial aneurysm is identified.  Posterior circulation: The intracranial vertebral arteries are patent to the basilar with the left being dominant. AICA origins are patent with the right being dominant. Right PICA shares a common origin with the right AICA. Left PICA is not clearly identified. SCA origins are patent. Basilar artery is patent without stenosis. There is a small patent right posterior communicating artery. PCAs are unremarkable.  Venous sinuses: Patent.  Anatomic variants: None.  Delayed phase: There is evidence of developing cytotoxic edema in the right MCA territory involving the insula and adjacent frontal, temporal, and parietal lobes.  IMPRESSION: 1. Right M1 occlusion with evidence of developing acute ischemia in the right MCA territory. 2. No carotid artery or vertebral artery stenosis.  Critical Value/emergent results were called by telephone at the time of interpretation on 10/27/2014 at 9:11 am to Dr. Jimmye Norman, who verbally acknowledged these results.   Electronically  Signed   By: Logan Bores   On: 10/27/2014 09:14   Mr Brain Wo Contrast  10/27/2014   ADDENDUM REPORT: 10/27/2014 14:29  ADDENDUM: Study discussed by telephone with Dr. Max Sane on 10/27/2014 at 1424 hrs.   Electronically Signed   By: Genevie Ann M.D.   On: 10/27/2014 14:29   10/27/2014   CLINICAL DATA:  59 year old male who fell this morning with right facial droop and loss of left side movement. Right M1 MCA occlusion. Initial encounter.  EXAM: MRI HEAD WITHOUT CONTRAST  MRA HEAD WITHOUT CONTRAST  TECHNIQUE: Multiplanar, multiecho pulse sequences of the brain and surrounding structures were obtained without intravenous contrast. Angiographic images of the head were obtained using MRA technique without contrast.  COMPARISON:  CTA head and neck 0817 hr today.  FINDINGS: MRI HEAD FINDINGS  Confluent restricted diffusion in the right MCA territory. Changes are most concentrated at the insula, operculum, and right basal ganglia. Estimated volume of restricted diffusion 175 mL.  Developing cytotoxic edema. Trace petechial hemorrhage (series 9, image 17). Mild mass effect on the right lateral ventricle, no midline shift at this point.  Abnormal increased FLAIR signal in the right MCA branches. Other Major intracranial vascular flow voids are preserved.  No contralateral left hemisphere or posterior fossa restricted diffusion. Aside from the acute signal changes there are scattered small subcortical white matter foci of T2 and FLAIR hyperintensity in both hemispheres. The thalami, brainstem, and cerebellum are within normal limits.  No ventriculomegaly. Normal basilar cisterns. Negative pituitary, cervicomedullary junction and visualized cervical spine. Visible internal auditory structures appear normal. Mastoids are clear. Chronic left orbit fracture. Mild paranasal sinus mucosal thickening. Mildly dysconjugate gaze. Visualized scalp soft tissues are within normal limits. Normal bone marrow signal.  MRA HEAD FINDINGS   Antegrade flow in the posterior circulation with dominant distal left vertebral artery. Patent vertebrobasilar junction and basilar artery. Normal SCA and PCA origins. Right posterior communicating artery appears normal. Bilateral PCA branches are normal.  Antegrade flow in both ICA siphons. No siphon stenosis. Patent carotid termini. Ophthalmic artery origins are normal. Left MCA and bilateral ACA origins and visualized branches are within normal limits.  Right MCA M1 occlusion about 8 mm beyond its origin. No distal right MCA flow signal detected.  IMPRESSION: 1. Right MCA M1 occlusion re- identified, with moderate to large acute right MCA infarct (infarct core volume estimated at 175 mL). 2. Small volume petechial hemorrhage. Developing cytotoxic edema with only mild mass effect at this time (no midline shift). Noncontrast head CT can be utilized for surveillance of intracranial mass effect. 3. Otherwise negative intracranial MRA. 4. Underlying mild for age nonspecific cerebral white matter changes.  Electronically Signed: By: Genevie Ann M.D. On: 10/27/2014 14:16   Mr Jodene Nam Head/brain Wo Cm  10/27/2014   ADDENDUM REPORT: 10/27/2014 14:29  ADDENDUM: Study discussed by telephone with Dr. Max Sane on 10/27/2014 at 1424 hrs.   Electronically Signed   By: Genevie Ann M.D.   On: 10/27/2014 14:29   10/27/2014   CLINICAL DATA:  59 year old male who fell this morning with right facial droop and loss of left side movement. Right M1 MCA occlusion. Initial encounter.  EXAM: MRI HEAD WITHOUT CONTRAST  MRA HEAD WITHOUT CONTRAST  TECHNIQUE: Multiplanar, multiecho pulse sequences of the brain and surrounding structures were obtained without intravenous contrast. Angiographic images of the head were obtained using MRA technique without contrast.  COMPARISON:  CTA head and neck 0817 hr today.  FINDINGS: MRI HEAD FINDINGS  Confluent restricted diffusion in the right MCA territory. Changes are most concentrated at the insula, operculum,  and right basal ganglia. Estimated volume of restricted diffusion 175 mL.  Developing cytotoxic edema. Trace petechial hemorrhage (series 9, image 17). Mild mass effect on the right lateral ventricle, no midline shift at this point.  Abnormal increased FLAIR signal in the right MCA branches. Other Major intracranial vascular flow voids are preserved.  No contralateral left hemisphere or posterior fossa restricted diffusion. Aside from the acute signal changes there are scattered small subcortical white matter foci of T2 and FLAIR hyperintensity in both hemispheres. The thalami, brainstem, and cerebellum are within normal limits.  No ventriculomegaly. Normal basilar cisterns. Negative pituitary, cervicomedullary junction and visualized cervical spine. Visible internal auditory structures appear normal. Mastoids are clear. Chronic left orbit fracture. Mild paranasal sinus mucosal thickening. Mildly dysconjugate gaze. Visualized scalp soft tissues are within normal limits. Normal bone marrow signal.  MRA HEAD FINDINGS  Antegrade flow in the posterior circulation with dominant distal left vertebral artery. Patent vertebrobasilar junction and basilar artery. Normal SCA and PCA origins. Right posterior communicating artery appears normal. Bilateral PCA branches are normal.  Antegrade flow in both ICA siphons. No siphon stenosis. Patent carotid termini.  Ophthalmic artery origins are normal. Left MCA and bilateral ACA origins and visualized branches are within normal limits.  Right MCA M1 occlusion about 8 mm beyond its origin. No distal right MCA flow signal detected.  IMPRESSION: 1. Right MCA M1 occlusion re- identified, with moderate to large acute right MCA infarct (infarct core volume estimated at 175 mL). 2. Small volume petechial hemorrhage. Developing cytotoxic edema with only mild mass effect at this time (no midline shift). Noncontrast head CT can be utilized for surveillance of intracranial mass effect. 3.  Otherwise negative intracranial MRA. 4. Underlying mild for age nonspecific cerebral white matter changes.  Electronically Signed: By: Genevie Ann M.D. On: 10/27/2014 14:16    Assessment: 59 y.o. male with R M1 occlusion Last known well 11AM, refused TPA, not thrombectomy candidate. Plegic on L side.    Stroke Risk Factors - family history, hyperlipidemia and hypertension  Plan: 1. HgbA1c, fasting lipid panel 2. PT consult, OT consult, Speech consult 3. Echocardiogram 6. Carotid dopplers 6. Prophylactic therapy-Antiplatelet med: Aspirin - dose 325 if fails Speech ASA 300 rectal 7. NPO until RN stroke swallow screen 8. Telemetry monitoring 9. Frequent neuro checks 10. Would repeat CTH in 48 hrs or if mental status worsens due to likely worsening cytotoxic edema and might need mannitol/hypertonic saline.      Leotis Pain   10/27/2014, 3:14 PM

## 2014-10-27 NOTE — ED Notes (Signed)
Family at bedside; confirmed EMS presentation. Pt's family states that the Pt complained of not feeling good yesterday.

## 2014-10-27 NOTE — ED Provider Notes (Signed)
Discussed patient with narrow hospitalist at [facility] who do not feel like patient was candidate for intra-arterial thrombectomy or TPA. Patient remains with previously mentioned neurodeficits this time. Vital signs remained stable. Patient intermittently following commands. Discussed case with the family, patient was discussed with the hospitalist will need admission. Diagnoses acute CVA.  Earleen Newport, MD 10/27/14 [phone]

## 2014-10-27 NOTE — ED Provider Notes (Signed)
[facility] Emergency Department Provider Note  ____________________________________________  Time seen: Approximately 283 AM  I have reviewed the triage vital signs and the nursing notes.  The history is limited by the patient's inability to answer questions   HISTORY  Chief Complaint Altered Mental Status    HPI POWELL HALBERT is a 59 y.o. male who woke up to go to work and his wife heard him fall.The patient per EMS had been in the kitchen for a little bit and then went back into the bedroom. The patient's wife heard him fall. Per EMS the patient was last seen normal at 11:00. In EMS arrived the patient was not appropriately answering questions and was gazing to the right. They were unsure if the patient had taken a medication to cause his altered mental status. The patient is unable to answer questions.   Past Medical History  Diagnosis Date  . Hypertension   . Pancreatitis   . Diabetes mellitus without complication     Patient Active Problem List   Diagnosis Date Noted  . Lymphadenitis 04/09/2011  . HTN (hypertension) 04/09/2011  . DM (diabetes mellitus) 04/09/2011    No past surgical history on file.  Current Outpatient Rx  Name  Route  Sig  Dispense  Refill  . amLODipine (NORVASC) 10 MG tablet   Oral   Take 10 mg by mouth daily.         Marland Kitchen gemfibrozil (LOPID) 600 MG tablet   Oral   Take 600 mg by mouth 2 (two) times daily before a meal.         . meloxicam (MOBIC) 15 MG tablet   Oral   Take 15 mg by mouth daily.           Allergies Review of patient's allergies indicates no known allergies.  No family history on file.  Social History History  Substance Use Topics  . Smoking status: Current Every Day Smoker  . Smokeless tobacco: Not on file  . Alcohol Use: Yes    Review of Systems Constitutional: No fever/chills Eyes: No visual changes. ENT: No sore throat. Cardiovascular: Denies chest pain. Respiratory:  Denies shortness of breath. Gastrointestinal: No abdominal pain.  No nausea, no vomiting.  No diarrhea.  No constipation. Genitourinary: Negative for dysuria. Musculoskeletal: Negative for back pain. Skin: Negative for rash. Neurological: Weakness in left upper and lower extremity, unresponsive verbally  10-point ROS otherwise negative.  ____________________________________________   PHYSICAL EXAM:  VITAL SIGNS: ED Triage Vitals  Enc Vitals Group     BP 10/27/14 0606 134/99 mmHg     Pulse Rate 10/27/14 0606 60     Resp 10/27/14 0606 14     Temp --      Temp src --      SpO2 10/27/14 0606 96 %     Weight 10/27/14 0606 165 lb (74.844 kg)     Height 10/27/14 0606 5\' 6"  (1.676 m)     Head Cir --      Peak Flow --      Pain Score 10/27/14 0606 0     Pain Loc --      Pain Edu? --      Excl. in [place]? --      Constitutional: Alert but minimally responsive in moderate to severe distress Eyes: Conjunctivae are normal. Pupils are equal round reactive to light, right gaze preference Head: Atraumatic. Nose: No congestion/rhinnorhea. Mouth/Throat: Mucous membranes are moist.  Oropharynx non-erythematous. Cardiovascular: Normal rate,  regular rhythm. Grossly normal heart sounds.  Good peripheral circulation. Respiratory: Normal respiratory effort.  No retractions. Lungs CTAB. Gastrointestinal: Soft and nontender. No distention. Positive bowel sounds Genitourinary: Deferred Musculoskeletal: No lower extremity tenderness nor edema.  No joint effusions. Neurologic: Patient currently nonverbal but when he attempts to speak it is very garbled, patient has a right gaze preference, patient has left-sided hemiparesis of upper and lower extremity, patient has right tongue deviation, patient has a tremor to his right hand. Patient has an upgoing Babinski on the left. Patient does withdraw lower extremities to pain. Patient intermittently follows commands such as open mouth and stick out tongue and  squeeze hand but cannot track with eyes. Cannot move left extremities. Skin:  Skin is warm, dry and intact. No rash noted.   ____________________________________________   LABS (all labs ordered are listed, but only abnormal results are displayed)  Labs Reviewed  CBC - Abnormal; Notable for the following:    RDW 14.6 (*)    All other components within normal limits  COMPREHENSIVE METABOLIC PANEL - Abnormal; Notable for the following:    Glucose, Bld 136 (*)    Creatinine, Ser 1.26 (*)    Calcium 8.8 (*)    ALT 9 (*)    Total Bilirubin 1.6 (*)    All other components within normal limits  GLUCOSE, CAPILLARY - Abnormal; Notable for the following:    Glucose-Capillary 135 (*)    All other components within normal limits  TROPONIN I   ____________________________________________  EKG  ED ECG REPORT I, Loney Hering, the attending physician, personally viewed and interpreted this ECG.   Date: 10/27/2014  EKG Time: 523  Rate: 70  Rhythm: normal sinus rhythm  Axis: Normal  Intervals:none  ST&T Change: No ST segment elevation, flipped T waves in leads 3  ____________________________________________  RADIOLOGY  CT head: Questionable foci of acute ischemia involving the right parietal lobe. No intracranial hemorrhage. ____________________________________________   PROCEDURES  Procedure(s) performed: None  Critical Care performed: Yes, see critical care note(s)  ____________________________________________   INITIAL IMPRESSION / ASSESSMENT AND PLAN / ED COURSE  Pertinent labs & imaging results that were available during my care of the patient were reviewed by me and considered in my medical decision making (see chart for details).  The patient is a 59 year old male who comes in today with acute altered mental status and left-sided hemiparesis with a concern for a stroke. Initially EMS said that he was last seen normal at 11 PM but the patient's wife reports  that she did speak to him and he was normal at 4 AM when he awoke. Given that information I did contact specialist on-call to evaluate the patient for possible TPA given his deficits. The patient did also receive aspirin rectally.   The patient was seen by the specialist on-call who offered TPA to the patient's wife and she refused if it would not guarantee that his symptoms would improve. After discussing with the neurologist on-call the decision was made to do a CT angiography to determine if the patient has an area where he may receive an embolectomy at an outside facility.  Dr. Jimmye Norman will follow-up the results of the patient's CTA and determine if the patient needs transfer. ____________________________________________   FINAL CLINICAL IMPRESSION(S) / ED DIAGNOSES  Final diagnoses:  Right sided weakness      Loney Hering, MD 10/27/14 [phone]

## 2014-10-28 LAB — LIPID PANEL
Cholesterol: 186 mg/dL (ref 0–200)
HDL: 37 mg/dL — AB (ref >40)
LDL Cholesterol: 88 mg/dL (ref 0–99)
TRIGLYCERIDES: 307 mg/dL — AB (ref <150)
Total CHOL/HDL Ratio: 5 RATIO
VLDL: 61 mg/dL — ABNORMAL HIGH (ref 0–40)

## 2014-10-28 LAB — HEMOGLOBIN A1C: Hgb A1c MFr Bld: 5.6 % (ref 4.0–6.0)

## 2014-10-28 MED ORDER — CHLORHEXIDINE GLUCONATE 0.12 % MT SOLN
15.0000 mL | Freq: Two times a day (BID) | OROMUCOSAL | Status: DC
  Administered 2014-10-28 – 2014-11-01 (×7): 15 mL via OROMUCOSAL

## 2014-10-28 MED ORDER — ACETAMINOPHEN 500 MG PO TABS
500.0000 mg | ORAL_TABLET | ORAL | Status: DC | PRN
  Administered 2014-10-28: 500 mg via ORAL
  Filled 2014-10-28: qty 1

## 2014-10-28 MED ORDER — CETYLPYRIDINIUM CHLORIDE 0.05 % MT LIQD
7.0000 mL | Freq: Two times a day (BID) | OROMUCOSAL | Status: DC
  Administered 2014-10-28 – 2014-10-31 (×8): 7 mL via OROMUCOSAL

## 2014-10-28 MED ORDER — ATORVASTATIN CALCIUM 20 MG PO TABS
40.0000 mg | ORAL_TABLET | Freq: Every day | ORAL | Status: DC
  Administered 2014-10-28: 40 mg via ORAL
  Filled 2014-10-28: qty 2

## 2014-10-28 NOTE — Care Management (Signed)
Patient presented with sx concerning for cva.  MRI poistive for substantial right MCA and petechial hemorrhage.  Some edema but no midline shift.  Prior to this event, patient independent in all adls and working.  Rehab disciplines are recommending CIR.     Spoke with patient and family regarding potential for acute inpatient rehab as a discharge option.   Wife says that she fully expects to patient to return home and there will be family to assist. Discussed other options- skilled nursing and home health.  Gave permission for CM to reach out to [facility] with [facility] inpatient rehab.

## 2014-10-28 NOTE — Evaluation (Signed)
Occupational Therapy Evaluation Patient Details Name: EMELIO SCHNELLER MRN: [id number] DOB: [birth year] Today's Date: 10/28/2014    History of Present Illness This patient is a 59 year old male who came to [facility] suffering a R MCA CVA   Clinical Impression   He lives with his wife and had been independent and works as a Teacher, early years/pre. He now requires moderate to maximal assist for lower body dressing. It should be noted that he was quit sleepy during this evaluation but did try hard to participate. Patient right arm shows 5/5 strength and left arm is flaccid. Left hand lacks sensation to temperature. Sharp/dull and light touch answers were unclear from patient.  Patient would benefit from Occupational Therapy for ADL/functioal mobility training and L UE restoration.   Follow Up Recommendations  CIR    Equipment Recommendations       Recommendations for Other Services       Precautions / Restrictions Precautions Precautions: None Restrictions Weight Bearing Restrictions: No      Mobility Bed Mobility Overal bed mobility: Needs Assistance;+2 for physical assistance             General bed mobility comments: Patient is moderate assist of one supine to sit with patient using bedrail with right upper extremity. He is unable to maintain sitting.  Transfers Overall transfer  Equipment used:  Transfers: Sit to/from [employer] transfer comment:    Balance                                            ADL Overall ADL's : Independent (Previously)                                             Estate agent      Pertinent Vitals/Pain Pain Assessment: No/denies pain     Hand Dominance Right   Extremity/Trunk Assessment Upper Extremity Assessment Upper Extremity Assessment:  (R UE 5/5 through out grip is 85 lbs left is flaccid.) LUE Deficits / Details: L UE flaccid           Communication  Communication Communication: Expressive difficulties   Cognition Arousal/Alertness: Lethargic (But tries hard to participate) Behavior During Therapy: WFL for tasks assessed/performed (but patient is sleepy) Overall Cognitive Status: Within Functional Limits for tasks assessed                     General Comments       Exercises Exercises: Other exercises Other Exercises Other Exercises:   Shoulder Instructions      Home Living Family/patient expects to be discharged to:: Inpatient rehab                                        Prior Functioning/Environment Level of Independence: Independent        Comments: Currently working as a sander    OT Diagnosis:     OT Problem List:     OT Treatment/Interventions: Self-care/ADL training;Neuromuscular education;Therapeutic exercise;Manual therapy    OT Goals(Current goals can be found in the care plan  section) Acute Rehab OT Goals Patient Stated Goal: to get out of bed OT Goal Formulation: With patient/family Potential to Achieve Goals: Good  OT Frequency: Min 1X/week   Barriers to D/C:            Co-evaluation              End of Session    Activity Tolerance:  (At the time of this evaluation, patient put forth good effort but was very lathargic) Patient left: in bed;with bed alarm set;with family/visitor present   Time: 1345-1407 OT Time Calculation (min): 22 min Charges:  OT Evaluation $Initial OT Evaluation Tier I: 1 Procedure G-Codes:    Myrene Galas, MS/OTR/L  10/28/2014, 2:37 PM

## 2014-10-28 NOTE — Progress Notes (Signed)
Dr. Ether Griffins ordered tylenol 500mg  po for pts. headache

## 2014-10-28 NOTE — Consult Note (Signed)
Chief Complaint: R sided weakness   HPI: Daniel [name] is an 59 y.o. male with hx of hypercholesterolemia, HTN not well controlled, stopped taking his medications admitted with L sided weakness. Pt was found down with last seen normal around 11AM.  Pt refused TPA and was not candidate for intervention/clot retrieval post CTA.  Was not on ASA at home.      Past Medical History  Diagnosis Date  . Hypertension   . Pancreatitis   . Diabetes mellitus without complication     No past surgical history on file.  No family history on file. Social History:  reports that he has been smoking.  He does not have any smokeless tobacco history on file. He reports that he drinks alcohol. He reports that he does not use illicit drugs.  Allergies: No Known Allergies  Medications: I have reviewed the patient's current medications.  ROS: Unable to obtain   Physical Examination: Blood pressure 111/87, pulse 76, temperature 99 F (37.2 C), temperature source Oral, resp. rate 20, height 5\' 6"  (1.676 m), weight 89.631 kg (197 lb 9.6 oz), SpO2 100 %.  Dysarthric speech  Forced field deviation to R side Plegic LUE and LLE L facial droop L side neglect   Laboratory Studies:  Basic Metabolic Panel:  Recent Labs Lab 10/27/14 0525  NA 140  K 3.6  CL 109  CO2 24  GLUCOSE 136*  BUN 8  CREATININE 1.26*  CALCIUM 8.8*    Liver Function Tests:  Recent Labs Lab 10/27/14 0525  AST 30  ALT 9*  ALKPHOS 54  BILITOT 1.6*  PROT 7.1  ALBUMIN 3.8   No results for input(s): LIPASE, AMYLASE in the last 168 hours. No results for input(s): AMMONIA in the last 168 hours.  CBC:  Recent Labs Lab 10/27/14 0525  WBC 4.0  HGB 13.8  HCT 40.5  MCV 81.8  PLT 175    Cardiac Enzymes:  Recent Labs Lab 10/27/14 0525  TROPONINI <0.03    BNP: Invalid input(s): POCBNP  CBG:  Recent Labs Lab 10/27/14 0720  GLUCAP 135*    Microbiology: No results found for this or any previous  visit.  Coagulation Studies: No results for input(s): LABPROT, INR in the last 72 hours.  Urinalysis: No results for input(s): COLORURINE, LABSPEC, PHURINE, GLUCOSEU, HGBUR, BILIRUBINUR, KETONESUR, PROTEINUR, UROBILINOGEN, NITRITE, LEUKOCYTESUR in the last 168 hours.  Invalid input(s): APPERANCEUR  Lipid Panel:    Component Value Date/Time   CHOL 186 10/28/2014 0355   TRIG 307* 10/28/2014 0355   HDL 37* 10/28/2014 0355   CHOLHDL 5.0 10/28/2014 0355   VLDL 61* 10/28/2014 0355   LDLCALC 88 10/28/2014 0355    HgbA1C:  Lab Results  Component Value Date   HGBA1C 5.7* 04/09/2011    Urine Drug Screen:  No results found for: LABOPIA, COCAINSCRNUR, LABBENZ, AMPHETMU, THCU, LABBARB  Alcohol Level: No results for input(s): ETH in the last 168 hours.  Other results: EKG: normal EKG, normal sinus rhythm, unchanged from previous tracings.  Imaging: Ct Angio Head W/cm &/or Wo Cm  10/27/2014   CLINICAL DATA:  Altered mental status.  Right-sided weakness.  EXAM: CT ANGIOGRAPHY HEAD AND NECK  TECHNIQUE: Multidetector CT imaging of the head and neck was performed using the standard protocol during bolus administration of intravenous contrast. Multiplanar CT image reconstructions and MIPs were obtained to evaluate the vascular anatomy. Carotid stenosis measurements (when applicable) are obtained utilizing NASCET criteria, using the distal internal carotid diameter  as the denominator.  CONTRAST:  39mL OMNIPAQUE IOHEXOL 350 MG/ML SOLN  COMPARISON:  Noncontrast head CT earlier today. Soft tissue neck CT 04/09/2011.  FINDINGS: CTA NECK  Aortic arch: 3 vessel aortic arch. Brachiocephalic and subclavian arteries are widely patent.  Right carotid system: Common carotid artery is patent without stenosis. Cervical ICA is patent with mild noncalcified plaque proximally but no stenosis.  Left carotid system: Common carotid artery is patent without stenosis. Cervical ICA is patent with minimal noncalcified plaque  proximally and no stenosis.  Vertebral arteries: Vertebral arteries are patent with the left being dominant. No vertebral artery stenosis is identified.  Skeleton: Mild multilevel cervical spondylosis.  Other neck: No neck mass.  CTA HEAD  Anterior circulation: Internal carotid arteries are patent from skullbase to carotid termini without stenosis. There is occlusion of the proximal to mid right M1 segment just beyond the origin of the anterior temporal artery. There is very mild collateral supply of more distal right MCA branch vessels. Left MCA is unremarkable. ACAs are unremarkable. No intracranial aneurysm is identified.  Posterior circulation: The intracranial vertebral arteries are patent to the basilar with the left being dominant. AICA origins are patent with the right being dominant. Right PICA shares a common origin with the right AICA. Left PICA is not clearly identified. SCA origins are patent. Basilar artery is patent without stenosis. There is a small patent right posterior communicating artery. PCAs are unremarkable.  Venous sinuses: Patent.  Anatomic variants: None.  Delayed phase: There is evidence of developing cytotoxic edema in the right MCA territory involving the insula and adjacent frontal, temporal, and parietal lobes.  IMPRESSION: 1. Right M1 occlusion with evidence of developing acute ischemia in the right MCA territory. 2. No carotid artery or vertebral artery stenosis.  Critical Value/emergent results were called by telephone at the time of interpretation on 10/27/2014 at 9:11 am to Dr. Jimmye Norman, who verbally acknowledged these results.   Electronically Signed   By: Logan Bores   On: 10/27/2014 09:14   Dg Chest 2 View  10/27/2014   CLINICAL DATA:  Subsequent encounter for stroke  EXAM: CHEST  2 VIEW  COMPARISON:  01/25/2014.  FINDINGS: 1345 hours. Low volume, lordotic film without edema or focal airspace consolidation. The cardio pericardial silhouette is enlarged. Multiple old  left-sided rib fractures again noted. Telemetry leads overlie the chest.  IMPRESSION: No active cardiopulmonary disease.   Electronically Signed   By: Misty Stanley M.D.   On: 10/27/2014 14:20   Ct Head Wo Contrast  10/27/2014   CLINICAL DATA:  Code stroke. Right facial droop. "No movement on the left. "  EXAM: CT HEAD WITHOUT CONTRAST  TECHNIQUE: Contiguous axial images were obtained from the base of the skull through the vertex without intravenous contrast.  COMPARISON:  10/04/2013  FINDINGS: There is a subcentimeter focal hypodensity in the right parietal cortex that was not definitively seen on prior. Question minimal subcortical hypodensity in the adjacent parietal lobe.No intracranial hemorrhage, mass effect, or midline shift. No hydrocephalus. The basilar cisterns are patent. No intracranial fluid collection. Calvarium is intact. Minimal mucosal thickening involving the left ethmoid air cells and frontal sinus, remaining paranasal sinuses are well-aerated. Mastoid air cells are clear.  IMPRESSION: Questionable foci of acute ischemia involving the right parietal lobe. No intracranial hemorrhage.  These results were called by telephone at the time of interpretation on 10/27/2014 at 5:48 am to Dr. Charlesetta Ivory , who verbally acknowledged these results.   Electronically Signed  By: Jeb Levering M.D.   On: 10/27/2014 05:48   Ct Angio Neck W/cm &/or Wo/cm  10/27/2014   CLINICAL DATA:  Altered mental status.  Right-sided weakness.  EXAM: CT ANGIOGRAPHY HEAD AND NECK  TECHNIQUE: Multidetector CT imaging of the head and neck was performed using the standard protocol during bolus administration of intravenous contrast. Multiplanar CT image reconstructions and MIPs were obtained to evaluate the vascular anatomy. Carotid stenosis measurements (when applicable) are obtained utilizing NASCET criteria, using the distal internal carotid diameter as the denominator.  CONTRAST:  59mL OMNIPAQUE IOHEXOL 350 MG/ML SOLN   COMPARISON:  Noncontrast head CT earlier today. Soft tissue neck CT 04/09/2011.  FINDINGS: CTA NECK  Aortic arch: 3 vessel aortic arch. Brachiocephalic and subclavian arteries are widely patent.  Right carotid system: Common carotid artery is patent without stenosis. Cervical ICA is patent with mild noncalcified plaque proximally but no stenosis.  Left carotid system: Common carotid artery is patent without stenosis. Cervical ICA is patent with minimal noncalcified plaque proximally and no stenosis.  Vertebral arteries: Vertebral arteries are patent with the left being dominant. No vertebral artery stenosis is identified.  Skeleton: Mild multilevel cervical spondylosis.  Other neck: No neck mass.  CTA HEAD  Anterior circulation: Internal carotid arteries are patent from skullbase to carotid termini without stenosis. There is occlusion of the proximal to mid right M1 segment just beyond the origin of the anterior temporal artery. There is very mild collateral supply of more distal right MCA branch vessels. Left MCA is unremarkable. ACAs are unremarkable. No intracranial aneurysm is identified.  Posterior circulation: The intracranial vertebral arteries are patent to the basilar with the left being dominant. AICA origins are patent with the right being dominant. Right PICA shares a common origin with the right AICA. Left PICA is not clearly identified. SCA origins are patent. Basilar artery is patent without stenosis. There is a small patent right posterior communicating artery. PCAs are unremarkable.  Venous sinuses: Patent.  Anatomic variants: None.  Delayed phase: There is evidence of developing cytotoxic edema in the right MCA territory involving the insula and adjacent frontal, temporal, and parietal lobes.  IMPRESSION: 1. Right M1 occlusion with evidence of developing acute ischemia in the right MCA territory. 2. No carotid artery or vertebral artery stenosis.  Critical Value/emergent results were called by  telephone at the time of interpretation on 10/27/2014 at 9:11 am to Dr. Jimmye Norman, who verbally acknowledged these results.   Electronically Signed   By: Logan Bores   On: 10/27/2014 09:14   Mr Brain Wo Contrast  10/27/2014   ADDENDUM REPORT: 10/27/2014 14:29  ADDENDUM: Study discussed by telephone with Dr. Max Sane on 10/27/2014 at 1424 hrs.   Electronically Signed   By: Genevie Ann M.D.   On: 10/27/2014 14:29   10/27/2014   CLINICAL DATA:  59 year old male who fell this morning with right facial droop and loss of left side movement. Right M1 MCA occlusion. Initial encounter.  EXAM: MRI HEAD WITHOUT CONTRAST  MRA HEAD WITHOUT CONTRAST  TECHNIQUE: Multiplanar, multiecho pulse sequences of the brain and surrounding structures were obtained without intravenous contrast. Angiographic images of the head were obtained using MRA technique without contrast.  COMPARISON:  CTA head and neck 0817 hr today.  FINDINGS: MRI HEAD FINDINGS  Confluent restricted diffusion in the right MCA territory. Changes are most concentrated at the insula, operculum, and right basal ganglia. Estimated volume of restricted diffusion 175 mL.  Developing cytotoxic edema. Trace petechial  hemorrhage (series 9, image 17). Mild mass effect on the right lateral ventricle, no midline shift at this point.  Abnormal increased FLAIR signal in the right MCA branches. Other Major intracranial vascular flow voids are preserved.  No contralateral left hemisphere or posterior fossa restricted diffusion. Aside from the acute signal changes there are scattered small subcortical white matter foci of T2 and FLAIR hyperintensity in both hemispheres. The thalami, brainstem, and cerebellum are within normal limits.  No ventriculomegaly. Normal basilar cisterns. Negative pituitary, cervicomedullary junction and visualized cervical spine. Visible internal auditory structures appear normal. Mastoids are clear. Chronic left orbit fracture. Mild paranasal sinus mucosal  thickening. Mildly dysconjugate gaze. Visualized scalp soft tissues are within normal limits. Normal bone marrow signal.  MRA HEAD FINDINGS  Antegrade flow in the posterior circulation with dominant distal left vertebral artery. Patent vertebrobasilar junction and basilar artery. Normal SCA and PCA origins. Right posterior communicating artery appears normal. Bilateral PCA branches are normal.  Antegrade flow in both ICA siphons. No siphon stenosis. Patent carotid termini. Ophthalmic artery origins are normal. Left MCA and bilateral ACA origins and visualized branches are within normal limits.  Right MCA M1 occlusion about 8 mm beyond its origin. No distal right MCA flow signal detected.  IMPRESSION: 1. Right MCA M1 occlusion re- identified, with moderate to large acute right MCA infarct (infarct core volume estimated at 175 mL). 2. Small volume petechial hemorrhage. Developing cytotoxic edema with only mild mass effect at this time (no midline shift). Noncontrast head CT can be utilized for surveillance of intracranial mass effect. 3. Otherwise negative intracranial MRA. 4. Underlying mild for age nonspecific cerebral white matter changes.  Electronically Signed: By: Genevie Ann M.D. On: 10/27/2014 14:16   Mr Jodene Nam Head/brain Wo Cm  10/27/2014   ADDENDUM REPORT: 10/27/2014 14:29  ADDENDUM: Study discussed by telephone with Dr. Max Sane on 10/27/2014 at 1424 hrs.   Electronically Signed   By: Genevie Ann M.D.   On: 10/27/2014 14:29   10/27/2014   CLINICAL DATA:  59 year old male who fell this morning with right facial droop and loss of left side movement. Right M1 MCA occlusion. Initial encounter.  EXAM: MRI HEAD WITHOUT CONTRAST  MRA HEAD WITHOUT CONTRAST  TECHNIQUE: Multiplanar, multiecho pulse sequences of the brain and surrounding structures were obtained without intravenous contrast. Angiographic images of the head were obtained using MRA technique without contrast.  COMPARISON:  CTA head and neck 0817 hr today.   FINDINGS: MRI HEAD FINDINGS  Confluent restricted diffusion in the right MCA territory. Changes are most concentrated at the insula, operculum, and right basal ganglia. Estimated volume of restricted diffusion 175 mL.  Developing cytotoxic edema. Trace petechial hemorrhage (series 9, image 17). Mild mass effect on the right lateral ventricle, no midline shift at this point.  Abnormal increased FLAIR signal in the right MCA branches. Other Major intracranial vascular flow voids are preserved.  No contralateral left hemisphere or posterior fossa restricted diffusion. Aside from the acute signal changes there are scattered small subcortical white matter foci of T2 and FLAIR hyperintensity in both hemispheres. The thalami, brainstem, and cerebellum are within normal limits.  No ventriculomegaly. Normal basilar cisterns. Negative pituitary, cervicomedullary junction and visualized cervical spine. Visible internal auditory structures appear normal. Mastoids are clear. Chronic left orbit fracture. Mild paranasal sinus mucosal thickening. Mildly dysconjugate gaze. Visualized scalp soft tissues are within normal limits. Normal bone marrow signal.  MRA HEAD FINDINGS  Antegrade flow in the posterior circulation with dominant distal  left vertebral artery. Patent vertebrobasilar junction and basilar artery. Normal SCA and PCA origins. Right posterior communicating artery appears normal. Bilateral PCA branches are normal.  Antegrade flow in both ICA siphons. No siphon stenosis. Patent carotid termini. Ophthalmic artery origins are normal. Left MCA and bilateral ACA origins and visualized branches are within normal limits.  Right MCA M1 occlusion about 8 mm beyond its origin. No distal right MCA flow signal detected.  IMPRESSION: 1. Right MCA M1 occlusion re- identified, with moderate to large acute right MCA infarct (infarct core volume estimated at 175 mL). 2. Small volume petechial hemorrhage. Developing cytotoxic edema with  only mild mass effect at this time (no midline shift). Noncontrast head CT can be utilized for surveillance of intracranial mass effect. 3. Otherwise negative intracranial MRA. 4. Underlying mild for age nonspecific cerebral white matter changes.  Electronically Signed: By: Genevie Ann M.D. On: 10/27/2014 14:16    Assessment: 59 y.o. male with R M1 occlusion Last known well 11AM, refused TPA, not thrombectomy candidate. Plegic on L side.    Stroke Risk Factors - family history, hyperlipidemia and hypertension  Plan:  S/p PT/OT ,pt passed for diet He will need rehab placement Con't ASA If deterioration of mental status will need CTH to look for evolution of the cytotoxic edema.   D/w with wife.       Leotis Pain   10/28/2014, 1:59 PM

## 2014-10-28 NOTE — Progress Notes (Signed)
Rehab admissions - I received this referral from Nann, case manager and have reviewed pt's chart. I noted that PT is recommending CIR and that OT evaluation is pending. I have discussed pt's case with rehab team and rehab MD feels that pt is a good candidate for our inpatient rehab program.  Pt has [employer] and we will need insurance authorization to consider a possible inpatient rehab admit. I will need OT evaluation to also submit for insurance determination. I will open pt's case with insurance.  I plan to reach out to pt/family later today to share information about our rehab program.  I updated Nann as well. Thanks.  Nanetta Batty, PT Rehabilitation Admissions Coordinator [phone]

## 2014-10-28 NOTE — Progress Notes (Signed)
Physical Therapy Treatment Patient Details Name: Daniel [name] MRN: [id number] DOB: 27-Oct-1955 Today's Date: 10/28/2014    History of Present Illness Pt admitted for R MCA CVA.     PT Comments    Pt is making good progress towards goals with increased alertness this session compared to previous date. Pt with increased strength noted in L LE, however decreased movement noted in L UE, unable to grip therapist fingers this date. L side inattention noted, however pt able to track movement and attend to L side when cued. Pt very motivated to perform therapy and made good effort to demonstrate midline upright posture when seated and 1 attempt standing. Pt fatigues quickly and falls asleep once transferred back to bed. Written therex on board. Would plan to use platform rw next date for assistance during transfers.  Follow Up Recommendations  CIR     Equipment Recommendations   (will try platform rw next session)    Recommendations for Other Services Rehab consult     Precautions / Restrictions Precautions Precautions: None Restrictions Weight Bearing Restrictions: No    Mobility  Bed Mobility Overal bed mobility: Needs Assistance;+2 for physical assistance             General bed mobility comments: Pt able to roll to B sides in bed with min assist as therapist changes pad under patient. Pt then able to perform supine->sit with mod assist +2 with ability to demonstrate upright posture, breifly before fatiguing after 2 minutes, then leaning heavily to L side. Pt able to respond well to cues for midline posture. max assist +2 for returning supine along with scooting towards HOB.  Transfers Overall transfer level: Needs assistance Equipment used: 2 person hand held assist Transfers: Sit to/from Stand           General transfer comment: max assist +2 with L leaning noted. Able to stand for approx 10 seconds prior to fatigue with B knees buckling. unable to attempt further  transfers at this time.  Ambulation/Gait             General Gait Details: not safe to assess this date   Stairs            Wheelchair Mobility    Modified Rankin (Stroke Patients Only)       Balance                                    Cognition Arousal/Alertness:  (sleepy) Behavior During Therapy: WFL for tasks assessed/performed Overall Cognitive Status: Within Functional Limits for tasks assessed                      Exercises Other Exercises Other Exercises: Pt able to perform active assist ther-ex for L LE including SLRs, hip add squeezes, heel slides and attempted supine bridging. Pt requires mod cues for attention to L side and able to replicate ther-ex on R side prior to L side. Ataxic movement noted during ther-ex, however pt able to respond well to cues for smooth movement. All ther-ex performed x 10 reps with min/mod assist. L UE therex attempted, however limited movement noted.    General Comments        Pertinent Vitals/Pain Pain Assessment: No/denies pain    Home Living                      Prior Function  PT Goals (current goals can now be found in the care plan section) Acute Rehab PT Goals Patient Stated Goal: to get out of bed PT Goal Formulation: With patient/family Time For Goal Achievement: 11/10/14 Potential to Achieve Goals: Good Progress towards PT goals: Progressing toward goals    Frequency  7X/week    PT Plan Current plan remains appropriate    Co-evaluation             End of Session Equipment Utilized During Treatment: Gait belt Activity Tolerance: Patient tolerated treatment well Patient left: in bed;with bed alarm set     Time: 0258-5277 PT Time Calculation (min) (ACUTE ONLY): 25 min  Charges:  $Therapeutic Exercise: 8-22 mins $Therapeutic Activity: 8-22 mins                    G Codes:      Yameli Delamater 11/22/2014, 11:03 AM Greggory Stallion, PT,  DPT [phone]

## 2014-10-28 NOTE — Clinical Social Work Placement (Signed)
   CLINICAL SOCIAL WORK PLACEMENT  NOTE  Date:  10/28/2014  Patient Details  Name: Daniel [name] MRN: [id number] Date of Birth: 30-Jan-1956  Clinical Social Work is seeking post-discharge placement for this patient at the [facility] level of care (*CSW will initial, date and re-position this form in  chart as items are completed):  Yes   Patient/family provided with [facility] Work Department's list of facilities offering this level of care within the geographic area requested by the patient (or if unable, by the patient's family).  Yes   Patient/family informed of their freedom to choose among providers that offer the needed level of care, that participate in Medicare, Medicaid or managed care program needed by the patient, have an available bed and are willing to accept the patient.  Yes   Patient/family informed of [facility]'s ownership interest in [facility] and [facility], as well as of the fact that they are under no obligation to receive care at these facilities.  PASRR submitted to EDS on 10/28/14     PASRR number received on 10/28/14     Existing PASRR number confirmed on       FL2 transmitted to all facilities in geographic area requested by pt/family on       FL2 transmitted to all facilities within larger geographic area on       Patient informed that his/her managed care company has contracts with or will negotiate with certain facilities, including the following:            Patient/family informed of bed offers received.  Patient chooses bed at       Physician recommends and patient chooses bed at      Patient to be transferred to   on  .  Patient to be transferred to facility by       Patient family notified on   of transfer.  Name of family member notified:        PHYSICIAN       Additional Comment:   CSW will send out bed search once RNCM has received a response from in pt rehab facility.  If pt is denied FL2  is completed and ready to sent out for bed search.   _______________________________________________ Mathews Argyle, LCSW 10/28/2014, 3:09 PM

## 2014-10-28 NOTE — Clinical Social Work Note (Signed)
Clinical Social Work Assessment  Patient Details  Name: Daniel [name] MRN: [id number] Date of Birth: Feb 16, 1956  Date of referral:  10/28/14               Reason for consult:  Facility Placement                Permission sought to share information with:  Facility Sport and exercise psychologist, Family Supports, Case Optician, dispensing granted to share information::  Yes, Verbal Permission Granted  Name::        Agency::     Relationship::     Contact Information:     Housing/Transportation Living arrangements for the past 2 months:  Single Family Home Source of Information:  Spouse Patient Interpreter Needed:  None Criminal Activity/Legal Involvement Pertinent to Current Situation/Hospitalization:  No - Comment as needed Significant Relationships:  Adult Children, Spouse Lives with:  Spouse Do you feel safe going back to the place where you live?  No Need for family participation in patient care:  Yes (Comment)  Care giving concerns:  Pt's wife Percilla, stated that she would like to have pt closer, but she wants what ever treatment is best for him.   Social Worker assessment / plan:  CSW was able to speak to pt's wife.  She is in agreement with in pt rehab, but if that is not avaliable she is also in agreement with SNF placement.    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 Emotional Assessment Appearance:  Appears younger than stated age Attitude/Demeanor/Rapport:   (pt  was in and out of coniousness during assessment.) Affect (typically observed):   (pt was unable to speak during assessment.) Orientation:   (unable to assess as pt was not fully awake during assessment.) Alcohol / Substance use:  Never Used Psych involvement (Current and /or in the community):  No (Comment)  Discharge Needs  Concerns to be addressed:  Discharge Planning Concerns Readmission within the last 30 days:  No Current discharge risk:  Physical Impairment Barriers to Discharge:  Other (CSW currently working with [facility] for placement at either in pt rehab or SNF. )   Mathews Argyle, LCSW 10/28/2014, 3:03 PM

## 2014-10-28 NOTE — Evaluation (Signed)
Clinical/Bedside Swallow Evaluation Patient Details  Name: Daniel [name] MRN: [id number] Date of Birth: January 26, 1956  Today's Date: 10/28/2014 Time: SLP Start Time (ACUTE ONLY): 0800 SLP Stop Time (ACUTE ONLY): 0900 SLP Time Calculation (min) (ACUTE ONLY): 60 min  Past Medical History:  Past Medical History  Diagnosis Date  . Hypertension   . Pancreatitis   . Diabetes mellitus without complication    Past Surgical History: No past surgical history on file. HPI:  pt presents w/ moderate-severe Left sided neglect only coming to approx. midline+. Educated wife and pt on giving stimulation to look to the Left. Pt appears to present w/ Cognitive-Linguistic deficits(f/u assessment tomorrow). Pt does require moderate+ verbal/tactile cues to follow along w/ tasks and maintain attention to tasks; he often yawns and closed eyes x2 requring cues to realert. Wife reported he was impulsive as well as sleepy since this event.      Assessment / Plan / Recommendation Clinical Impression  Pt presented w/ moderate oral phase dysphagia w/ increased concern for mild pharyngeal phase dysphagia sec. to impact of oral phase deficits as well as acute CVA status w/ Cognitive deficits. Pt presented w/ oral phase deficits including Left labial weakness and decreased lingual-labial bolus management resulting in min. labial and Left oral residue. When pt used lingual sweep and a f/u swallow, he cleared orally b/t trials. A mild throat clear was noted x1 w/ all trials though multiple swallowing and slight lingual protrusion were noted inconsistently w/ thin liquid trials. Pt appears at increased risk for aspiration at this time.    Aspiration Risk  Mild (-moderate)    Diet Recommendation Dysphagia 1 (Puree);Thin   Medication Administration: Crushed with puree Compensations: Slow rate;Small sips/bites;Check for pocketing;Check for anterior loss;Multiple dry swallows after each bite/sip    Other  Recommendations  Recommended Consults:  (PT/OT following) Oral Care Recommendations: Oral care BID;Oral care before and after PO;Staff/trained caregiver to provide oral care Other Recommendations:  (moisten foods)   Follow Up Recommendations       Frequency and Duration min 3x week  1 week   Pertinent Vitals/Pain Denied pain    SLP Swallow Goals     Swallow Study Prior Functional Status   lived at home; noted ETOH use; tobacco use    General Date of Onset: 10/27/14 Other Pertinent Information: pt presents w/ moderate-severe Left sided neglect only coming to approx. midline+. Educated wife and pt on giving stimulation to look to the Left. Pt appears to present w/ Cognitive-Linguistic deficits(f/u assessment tomorrow). Pt does require moderate+ verbal/tactile cues to follow along w/ tasks and maintain attention to tasks; he often yawns and closed eyes x2 requring cues to realert. Wife reported he was impulsive as well as sleepy since this event.    Type of Study: Bedside swallow evaluation Previous Swallow Assessment: none indicated per chart Diet Prior to this Study: Regular;Thin liquids Temperature Spikes Noted: No Respiratory Status: Room air Behavior/Cognition: Alert;Cooperative;Pleasant mood;Confused;Distractible;Requires cueing;Doesn't follow directions;Impulsive Oral Cavity - Dentition:  (Dentures) Self-Feeding Abilities: Able to feed self;Total assist (unable to use Left hand) Patient Positioning: Upright in bed Baseline Vocal Quality: Low vocal intensity Volitional Cough: Strong Volitional Swallow: Able to elicit    Oral/Motor/Sensory Function Overall Oral Motor/Sensory Function: Impaired Labial ROM: Reduced left (moderate) Labial Symmetry: Abnormal symmetry left Labial Strength: Reduced Labial Sensation: Reduced Lingual ROM: Reduced left (moderate) Lingual Symmetry: Abnormal symmetry left (min. during protrusion) Lingual Strength: Reduced (on Left) Lingual Sensation:  (pt was unable  to fully follow along  w/ question/assessment) Facial ROM: Reduced left (min. ) Facial Symmetry: Left droop (around cheek/mouth) Facial Strength: Reduced (min. on left) Facial Sensation: Reduced Velum: Impaired left (slight) Mandible: Within Functional Limits   Ice Chips Ice chips: Impaired Presentation: Spoon ( fed by SLP;  6 trials) Oral Phase Impairments: Reduced labial seal;Reduced lingual movement/coordination;Impaired mastication Oral Phase Functional Implications: Left anterior spillage;Prolonged oral transit Pharyngeal Phase Impairments:  (none)   Thin Liquid Thin Liquid: Impaired Presentation: Cup;Self Fed (monitored for number of trials/sips by SLP d/t impulsivity) Oral Phase Impairments: Reduced labial seal;Reduced lingual movement/coordination (min. anterior protrusion noted inconsistently) Oral Phase Functional Implications: Left anterior spillage Pharyngeal  Phase Impairments: Throat Clearing - Delayed;Multiple swallows (x1/10+ trials; clear vocal quality noted otherwise) Other Comments: no coughing occured w/ trials    Nectar Thick Nectar Thick Liquid: Not tested   Honey Thick Honey Thick Liquid: Not tested   Puree Puree: Impaired Presentation: Self Fed;Spoon (w/ SLP providing support) Oral Phase Impairments: Reduced labial seal;Reduced lingual movement/coordination Oral Phase Functional Implications: Left anterior spillage;Oral residue (left) Pharyngeal Phase Impairments:  (none) Other Comments: lingual sweep and f/u swallowed appeared to clear residue   Solid   GO    Solid: Not tested       [facility] 10/28/2014,10:06 AM

## 2014-10-28 NOTE — Progress Notes (Signed)
Patient ID: Daniel [name], male   DOB: 05-Aug-1955, 59 y.o.   MRN: [id number] [facility] PROGRESS NOTE  PCP: Marden Noble, MD  HPI/Subjective: Patient seems a little confused as per me. He kept on repeating my name. He was unable to tell me his wife's name. Patient has good strength on his right side. Unable to move his left arm. Barely able to move his left leg.  Objective: Filed Vitals:   10/28/14 1102  BP: 111/87  Pulse: 76  Temp: 99 F (37.2 C)  Resp: 20    Intake/Output Summary (Last 24 hours) at 10/28/14 1524 Last data filed at 10/28/14 1130  Gross per 24 hour  Intake    240 ml  Output    450 ml  Net   -210 ml   Filed Weights   10/27/14 0606 10/28/14 0428  Weight: 74.844 kg (165 lb) 89.631 kg (197 lb 9.6 oz)    ROS: Review of Systems  Constitutional: Negative for fever and chills.  Eyes: Negative for blurred vision.  Respiratory: Negative for cough and shortness of breath.   Cardiovascular: Negative for chest pain.  Gastrointestinal: Negative for nausea, vomiting, abdominal pain, diarrhea and constipation.  Genitourinary: Negative for dysuria.  Musculoskeletal: Negative for joint pain.  Neurological: Positive for speech change, focal weakness and weakness. Negative for dizziness and headaches.   Exam: Physical Exam  HENT:  Nose: No mucosal edema.  Mouth/Throat: No oropharyngeal exudate or posterior oropharyngeal edema.  Eyes: Conjunctivae, EOM and lids are normal. Pupils are equal, round, and reactive to light.  Neck: No JVD present. Carotid bruit is not present. No edema present. No thyroid mass and no thyromegaly present.  Cardiovascular: S1 normal and S2 normal.  Exam reveals no gallop.   No murmur heard. Pulses:      Dorsalis pedis pulses are 2+ on the right side, and 2+ on the left side.  Respiratory: No respiratory distress. He has no wheezes. He has no rhonchi. He has no rales.  GI: Soft. Bowel sounds are normal. There is no  tenderness.  Musculoskeletal: Normal range of motion.       Right ankle: He exhibits no swelling.       Left ankle: He exhibits no swelling.  Lymphadenopathy:    He has no cervical adenopathy.  Neurological: He is alert. No cranial nerve deficit.  Left arm paralysis. Left leg weakness 3+ out of 5 strength.  Skin: Skin is warm. No rash noted. Nails show no clubbing.  Psychiatric: He has a normal mood and affect.   Data Reviewed: Basic Metabolic Panel:  Recent Labs Lab 10/27/14 0525  NA 140  K 3.6  CL 109  CO2 24  GLUCOSE 136*  BUN 8  CREATININE 1.26*  CALCIUM 8.8*   Liver Function Tests:  Recent Labs Lab 10/27/14 0525  AST 30  ALT 9*  ALKPHOS 54  BILITOT 1.6*  PROT 7.1  ALBUMIN 3.8   CBC:  Recent Labs Lab 10/27/14 0525  WBC 4.0  HGB 13.8  HCT 40.5  MCV 81.8  PLT 175   Cardiac Enzymes:  Recent Labs Lab 10/27/14 0525  TROPONINI <0.03   Studies: Ct Angio Head W/cm &/or Wo Cm  10/27/2014   CLINICAL DATA:  Altered mental status.  Right-sided weakness.  EXAM: CT ANGIOGRAPHY HEAD AND NECK  TECHNIQUE: Multidetector CT imaging of the head and neck was performed using the standard protocol during bolus administration of intravenous contrast. Multiplanar CT image reconstructions and  MIPs were obtained to evaluate the vascular anatomy. Carotid stenosis measurements (when applicable) are obtained utilizing NASCET criteria, using the distal internal carotid diameter as the denominator.  CONTRAST:  8mL OMNIPAQUE IOHEXOL 350 MG/ML SOLN  COMPARISON:  Noncontrast head CT earlier today. Soft tissue neck CT 04/09/2011.  FINDINGS: CTA NECK  Aortic arch: 3 vessel aortic arch. Brachiocephalic and subclavian arteries are widely patent.  Right carotid system: Common carotid artery is patent without stenosis. Cervical ICA is patent with mild noncalcified plaque proximally but no stenosis.  Left carotid system: Common carotid artery is patent without stenosis. Cervical ICA is patent  with minimal noncalcified plaque proximally and no stenosis.  Vertebral arteries: Vertebral arteries are patent with the left being dominant. No vertebral artery stenosis is identified.  Skeleton: Mild multilevel cervical spondylosis.  Other neck: No neck mass.  CTA HEAD  Anterior circulation: Internal carotid arteries are patent from skullbase to carotid termini without stenosis. There is occlusion of the proximal to mid right M1 segment just beyond the origin of the anterior temporal artery. There is very mild collateral supply of more distal right MCA branch vessels. Left MCA is unremarkable. ACAs are unremarkable. No intracranial aneurysm is identified.  Posterior circulation: The intracranial vertebral arteries are patent to the basilar with the left being dominant. AICA origins are patent with the right being dominant. Right PICA shares a common origin with the right AICA. Left PICA is not clearly identified. SCA origins are patent. Basilar artery is patent without stenosis. There is a small patent right posterior communicating artery. PCAs are unremarkable.  Venous sinuses: Patent.  Anatomic variants: None.  Delayed phase: There is evidence of developing cytotoxic edema in the right MCA territory involving the insula and adjacent frontal, temporal, and parietal lobes.  IMPRESSION: 1. Right M1 occlusion with evidence of developing acute ischemia in the right MCA territory. 2. No carotid artery or vertebral artery stenosis.  Critical Value/emergent results were called by telephone at the time of interpretation on 10/27/2014 at 9:11 am to Dr. Jimmye Norman, who verbally acknowledged these results.   Electronically Signed   By: Logan Bores   On: 10/27/2014 09:14   Dg Chest 2 View  10/27/2014   CLINICAL DATA:  Subsequent encounter for stroke  EXAM: CHEST  2 VIEW  COMPARISON:  01/25/2014.  FINDINGS: 1345 hours. Low volume, lordotic film without edema or focal airspace consolidation. The cardio pericardial silhouette is  enlarged. Multiple old left-sided rib fractures again noted. Telemetry leads overlie the chest.  IMPRESSION: No active cardiopulmonary disease.   Electronically Signed   By: Misty Stanley M.D.   On: 10/27/2014 14:20   Ct Head Wo Contrast  10/27/2014   CLINICAL DATA:  Code stroke. Right facial droop. "No movement on the left. "  EXAM: CT HEAD WITHOUT CONTRAST  TECHNIQUE: Contiguous axial images were obtained from the base of the skull through the vertex without intravenous contrast.  COMPARISON:  10/04/2013  FINDINGS: There is a subcentimeter focal hypodensity in the right parietal cortex that was not definitively seen on prior. Question minimal subcortical hypodensity in the adjacent parietal lobe.No intracranial hemorrhage, mass effect, or midline shift. No hydrocephalus. The basilar cisterns are patent. No intracranial fluid collection. Calvarium is intact. Minimal mucosal thickening involving the left ethmoid air cells and frontal sinus, remaining paranasal sinuses are well-aerated. Mastoid air cells are clear.  IMPRESSION: Questionable foci of acute ischemia involving the right parietal lobe. No intracranial hemorrhage.  These results were called by telephone at  the time of interpretation on 10/27/2014 at 5:48 am to Dr. Charlesetta Ivory , who verbally acknowledged these results.   Electronically Signed   By: Jeb Levering M.D.   On: 10/27/2014 05:48   Ct Angio Neck W/cm &/or Wo/cm  10/27/2014   CLINICAL DATA:  Altered mental status.  Right-sided weakness.  EXAM: CT ANGIOGRAPHY HEAD AND NECK  TECHNIQUE: Multidetector CT imaging of the head and neck was performed using the standard protocol during bolus administration of intravenous contrast. Multiplanar CT image reconstructions and MIPs were obtained to evaluate the vascular anatomy. Carotid stenosis measurements (when applicable) are obtained utilizing NASCET criteria, using the distal internal carotid diameter as the denominator.  CONTRAST:  67mL OMNIPAQUE  IOHEXOL 350 MG/ML SOLN  COMPARISON:  Noncontrast head CT earlier today. Soft tissue neck CT 04/09/2011.  FINDINGS: CTA NECK  Aortic arch: 3 vessel aortic arch. Brachiocephalic and subclavian arteries are widely patent.  Right carotid system: Common carotid artery is patent without stenosis. Cervical ICA is patent with mild noncalcified plaque proximally but no stenosis.  Left carotid system: Common carotid artery is patent without stenosis. Cervical ICA is patent with minimal noncalcified plaque proximally and no stenosis.  Vertebral arteries: Vertebral arteries are patent with the left being dominant. No vertebral artery stenosis is identified.  Skeleton: Mild multilevel cervical spondylosis.  Other neck: No neck mass.  CTA HEAD  Anterior circulation: Internal carotid arteries are patent from skullbase to carotid termini without stenosis. There is occlusion of the proximal to mid right M1 segment just beyond the origin of the anterior temporal artery. There is very mild collateral supply of more distal right MCA branch vessels. Left MCA is unremarkable. ACAs are unremarkable. No intracranial aneurysm is identified.  Posterior circulation: The intracranial vertebral arteries are patent to the basilar with the left being dominant. AICA origins are patent with the right being dominant. Right PICA shares a common origin with the right AICA. Left PICA is not clearly identified. SCA origins are patent. Basilar artery is patent without stenosis. There is a small patent right posterior communicating artery. PCAs are unremarkable.  Venous sinuses: Patent.  Anatomic variants: None.  Delayed phase: There is evidence of developing cytotoxic edema in the right MCA territory involving the insula and adjacent frontal, temporal, and parietal lobes.  IMPRESSION: 1. Right M1 occlusion with evidence of developing acute ischemia in the right MCA territory. 2. No carotid artery or vertebral artery stenosis.  Critical Value/emergent  results were called by telephone at the time of interpretation on 10/27/2014 at 9:11 am to Dr. Jimmye Norman, who verbally acknowledged these results.   Electronically Signed   By: Logan Bores   On: 10/27/2014 09:14   Mr Brain Wo Contrast  10/27/2014   ADDENDUM REPORT: 10/27/2014 14:29  ADDENDUM: Study discussed by telephone with Dr. Max Sane on 10/27/2014 at 1424 hrs.   Electronically Signed   By: Genevie Ann M.D.   On: 10/27/2014 14:29   10/27/2014   CLINICAL DATA:  59 year old male who fell this morning with right facial droop and loss of left side movement. Right M1 MCA occlusion. Initial encounter.  EXAM: MRI HEAD WITHOUT CONTRAST  MRA HEAD WITHOUT CONTRAST  TECHNIQUE: Multiplanar, multiecho pulse sequences of the brain and surrounding structures were obtained without intravenous contrast. Angiographic images of the head were obtained using MRA technique without contrast.  COMPARISON:  CTA head and neck 0817 hr today.  FINDINGS: MRI HEAD FINDINGS  Confluent restricted diffusion in the right MCA territory.  Changes are most concentrated at the insula, operculum, and right basal ganglia. Estimated volume of restricted diffusion 175 mL.  Developing cytotoxic edema. Trace petechial hemorrhage (series 9, image 17). Mild mass effect on the right lateral ventricle, no midline shift at this point.  Abnormal increased FLAIR signal in the right MCA branches. Other Major intracranial vascular flow voids are preserved.  No contralateral left hemisphere or posterior fossa restricted diffusion. Aside from the acute signal changes there are scattered small subcortical white matter foci of T2 and FLAIR hyperintensity in both hemispheres. The thalami, brainstem, and cerebellum are within normal limits.  No ventriculomegaly. Normal basilar cisterns. Negative pituitary, cervicomedullary junction and visualized cervical spine. Visible internal auditory structures appear normal. Mastoids are clear. Chronic left orbit fracture. Mild  paranasal sinus mucosal thickening. Mildly dysconjugate gaze. Visualized scalp soft tissues are within normal limits. Normal bone marrow signal.  MRA HEAD FINDINGS  Antegrade flow in the posterior circulation with dominant distal left vertebral artery. Patent vertebrobasilar junction and basilar artery. Normal SCA and PCA origins. Right posterior communicating artery appears normal. Bilateral PCA branches are normal.  Antegrade flow in both ICA siphons. No siphon stenosis. Patent carotid termini. Ophthalmic artery origins are normal. Left MCA and bilateral ACA origins and visualized branches are within normal limits.  Right MCA M1 occlusion about 8 mm beyond its origin. No distal right MCA flow signal detected.  IMPRESSION: 1. Right MCA M1 occlusion re- identified, with moderate to large acute right MCA infarct (infarct core volume estimated at 175 mL). 2. Small volume petechial hemorrhage. Developing cytotoxic edema with only mild mass effect at this time (no midline shift). Noncontrast head CT can be utilized for surveillance of intracranial mass effect. 3. Otherwise negative intracranial MRA. 4. Underlying mild for age nonspecific cerebral white matter changes.  Electronically Signed: By: Genevie Ann M.D. On: 10/27/2014 14:16   Mr Jodene Nam Head/brain Wo Cm  10/27/2014   ADDENDUM REPORT: 10/27/2014 14:29  ADDENDUM: Study discussed by telephone with Dr. Max Sane on 10/27/2014 at 1424 hrs.   Electronically Signed   By: Genevie Ann M.D.   On: 10/27/2014 14:29   10/27/2014   CLINICAL DATA:  59 year old male who fell this morning with right facial droop and loss of left side movement. Right M1 MCA occlusion. Initial encounter.  EXAM: MRI HEAD WITHOUT CONTRAST  MRA HEAD WITHOUT CONTRAST  TECHNIQUE: Multiplanar, multiecho pulse sequences of the brain and surrounding structures were obtained without intravenous contrast. Angiographic images of the head were obtained using MRA technique without contrast.  COMPARISON:  CTA head and  neck 0817 hr today.  FINDINGS: MRI HEAD FINDINGS  Confluent restricted diffusion in the right MCA territory. Changes are most concentrated at the insula, operculum, and right basal ganglia. Estimated volume of restricted diffusion 175 mL.  Developing cytotoxic edema. Trace petechial hemorrhage (series 9, image 17). Mild mass effect on the right lateral ventricle, no midline shift at this point.  Abnormal increased FLAIR signal in the right MCA branches. Other Major intracranial vascular flow voids are preserved.  No contralateral left hemisphere or posterior fossa restricted diffusion. Aside from the acute signal changes there are scattered small subcortical white matter foci of T2 and FLAIR hyperintensity in both hemispheres. The thalami, brainstem, and cerebellum are within normal limits.  No ventriculomegaly. Normal basilar cisterns. Negative pituitary, cervicomedullary junction and visualized cervical spine. Visible internal auditory structures appear normal. Mastoids are clear. Chronic left orbit fracture. Mild paranasal sinus mucosal thickening. Mildly dysconjugate gaze. Visualized  scalp soft tissues are within normal limits. Normal bone marrow signal.  MRA HEAD FINDINGS  Antegrade flow in the posterior circulation with dominant distal left vertebral artery. Patent vertebrobasilar junction and basilar artery. Normal SCA and PCA origins. Right posterior communicating artery appears normal. Bilateral PCA branches are normal.  Antegrade flow in both ICA siphons. No siphon stenosis. Patent carotid termini. Ophthalmic artery origins are normal. Left MCA and bilateral ACA origins and visualized branches are within normal limits.  Right MCA M1 occlusion about 8 mm beyond its origin. No distal right MCA flow signal detected.  IMPRESSION: 1. Right MCA M1 occlusion re- identified, with moderate to large acute right MCA infarct (infarct core volume estimated at 175 mL). 2. Small volume petechial hemorrhage. Developing  cytotoxic edema with only mild mass effect at this time (no midline shift). Noncontrast head CT can be utilized for surveillance of intracranial mass effect. 3. Otherwise negative intracranial MRA. 4. Underlying mild for age nonspecific cerebral white matter changes.  Electronically Signed: By: Genevie Ann M.D. On: 10/27/2014 14:16    Scheduled Meds: . antiseptic oral rinse  7 mL Mouth Rinse q12n4p  . aspirin  325 mg Oral Daily  . atorvastatin  40 mg Oral q1800  . chlorhexidine  15 mL Mouth Rinse BID  . enoxaparin (LOVENOX) injection  40 mg Subcutaneous Q24H  . gemfibrozil  600 mg Oral BID AC    Assessment/Plan: 1. Acute stroke with left arm paralysis and left leg weakness and dysphasia. Patient also has some edema and petechial hemorrhage. - Appreciate neurology evaluation. Patient's family had refused TPA on admission. Continue aspirin. Switch statin to Lipitor. - Appreciate physical therapy, occupational therapy, speech pathology consultations.  Patient put on diet by speech pathology today. - Spoke with Education officer, museum about the need for rehabilitation. 2. Hypertriglyceridemia- the patient is on gemfibrozil. 3. Essential hypertension- blood pressure is on the lower range off his usual medication. Continue to watch off blood pressure medication at this time.  Code Status:     Code Status Orders        Start     Ordered   10/27/14 1202  Full code   Continuous     10/27/14 1201     Family Communication: Wife at the bedside Disposition Plan: Will need rehabilitation  Consultants:  Dr. Irish Elders neurology, PT, OT, speech pathology  Time spent: 30 minutes coordination of care speaking with patient,  Family, speech pathology, social work team.  Loletha Grayer  [facility]

## 2014-10-29 ENCOUNTER — Encounter: Payer: Self-pay | Admitting: Physician Assistant

## 2014-10-29 MED ORDER — ROSUVASTATIN CALCIUM 10 MG PO TABS
10.0000 mg | ORAL_TABLET | Freq: Every day | ORAL | Status: DC
  Administered 2014-10-29 – 2014-11-01 (×4): 10 mg via ORAL
  Filled 2014-10-29 (×4): qty 1

## 2014-10-29 NOTE — Progress Notes (Signed)
Patient ID: Daniel [name], male   DOB: Nov 08, 1955, 59 y.o.   MRN: [id number] [facility] PROGRESS NOTE  PCP: Marden Noble, MD  HPI/Subjective: It took a while for me to get him to talk. He stated that he was choked on some food todayhe is unable to move his left arm at all. He was able to wiggle his left toes and slightly flex at his left knee. Not too talkative today.  Objective: Filed Vitals:   10/29/14 1149  BP: 113/83  Pulse: 76  Temp: 98.6 F (37 C)  Resp: 18    Intake/Output Summary (Last 24 hours) at 10/29/14 1418 Last data filed at 10/29/14 0830  Gross per 24 hour  Intake      0 ml  Output      0 ml  Net      0 ml   Filed Weights   10/27/14 0606 10/28/14 0428 10/29/14 0428  Weight: 74.844 kg (165 lb) 89.631 kg (197 lb 9.6 oz) 89.54 kg (197 lb 6.4 oz)    ROS: Review of Systems  Constitutional: Negative for fever and chills.  Eyes: Negative for blurred vision.  Respiratory: Negative for cough and shortness of breath.   Cardiovascular: Negative for chest pain.  Gastrointestinal: Negative for nausea, vomiting, abdominal pain, diarrhea and constipation.  Genitourinary: Negative for dysuria.  Musculoskeletal: Negative for joint pain.  Neurological: Positive for focal weakness and weakness. Negative for dizziness and headaches.   Exam: Physical Exam  HENT:  Nose: No mucosal edema.  Mouth/Throat: No oropharyngeal exudate or posterior oropharyngeal edema.  Eyes: Conjunctivae, EOM and lids are normal. Pupils are equal, round, and reactive to light.  Neck: No JVD present. Carotid bruit is not present. No edema present. No thyroid mass and no thyromegaly present.  Cardiovascular: S1 normal and S2 normal.  Exam reveals no gallop.   No murmur heard. Pulses:      Dorsalis pedis pulses are 2+ on the right side, and 2+ on the left side.  Respiratory: No respiratory distress. He has no wheezes. He has no rhonchi. He has no rales.  GI: Soft. Bowel sounds  are normal. There is no tenderness.  Musculoskeletal: Normal range of motion.       Right ankle: He exhibits no swelling.       Left ankle: He exhibits no swelling.  Lymphadenopathy:    He has no cervical adenopathy.  Neurological: He is alert. No cranial nerve deficit.  Left arm paralysis. Left leg weakness 2 out of 5 strength.  Skin: Skin is warm. No rash noted. Nails show no clubbing.  Psychiatric: He has a normal mood and affect.   Data Reviewed: Basic Metabolic Panel:  Recent Labs Lab 10/27/14 0525  NA 140  K 3.6  CL 109  CO2 24  GLUCOSE 136*  BUN 8  CREATININE 1.26*  CALCIUM 8.8*   Liver Function Tests:  Recent Labs Lab 10/27/14 0525  AST 30  ALT 9*  ALKPHOS 54  BILITOT 1.6*  PROT 7.1  ALBUMIN 3.8   CBC:  Recent Labs Lab 10/27/14 0525  WBC 4.0  HGB 13.8  HCT 40.5  MCV 81.8  PLT 175   Cardiac Enzymes:  Recent Labs Lab 10/27/14 0525  TROPONINI <0.03    Scheduled Meds: . antiseptic oral rinse  7 mL Mouth Rinse q12n4p  . aspirin  325 mg Oral Daily  . chlorhexidine  15 mL Mouth Rinse BID  . enoxaparin (LOVENOX) injection  40  mg Subcutaneous Q24H  . gemfibrozil  600 mg Oral BID AC  . rosuvastatin  10 mg Oral q1800    Assessment/Plan: 1. Acute stroke with left arm paralysis and left leg weakness and dysphasia. Patient also has some edema and petechial hemorrhage. - Appreciate neurology evaluation. Patient's family had refused TPA on admission. Continue aspirin. Switch statin to Crestor. - Appreciate physical therapy, occupational therapy, speech pathology consultations.  - Patient will be a candidate for acute rehab - Echo could not rule out PFO or ASD- will get cardiology for consideration of TEE - Patient needs close medical supervision and a coordinated approach to therapies. 2. Hypertriglyceridemia- the patient is on gemfibrozil. 3. Essential hypertension- blood pressure is on the lower range off his usual medication. Continue to watch off  blood pressure medication at this time.  Code Status:     Code Status Orders        Start     Ordered   10/27/14 1202  Full code   Continuous     10/27/14 1201     Family Communication: Family at the bedside Disposition Plan: Will need Acute rehabilitation  Consultants:  Dr. Irish Elders neurology, PT, OT, speech pathology  Time spent: 25 minutes coordination of care speaking with patient,  Family, care manager team. Also left message for cardiologist.  Loletha Grayer  [facility]

## 2014-10-29 NOTE — Consult Note (Signed)
Cardiology Consultation Note  Patient ID: Daniel [name], MRN: [id number], DOB/AGE: 1955-12-13 59 y.o. Admit date: 10/27/2014   Date of Consult: 10/29/2014 Primary Physician: Marden Noble, MD Primary Cardiologist: New to [facility]  Chief Complaint: Fall with left sided weakness  Reason for Consult: Stroke  HPI: 59 y.o. male with h/o DM2, HTN, and pancreatitis who presented to [facility] on 6/1 after suffering a fall and was found to have left sided weakness. He was found to have acute ischemic stroke involving the right MCA.   Per his family he does not have any prior known cardiac history and has never previously had a stress test or cardiac cath. He recently stopped taking all of his medications which include amlodipine, Mobic, and Lopid. Per his family he does not present to medical care unless he absolutely has to.   He was apparently last seen in usual state of health somewhere between the hours of 11 PM and 4 AM prior to his admission. He was found down on the ground in his kitchen with left sided weakness. He was brought to [facility] ED for further evaluation. This was an unwitnessed event. It is unknown if he was experiencing any chest pain or palpitations at that time. Upon his arrival to [facility] the patient's family apparently refused tPA and he was not a candidate for further interventional therapies. He was not on aspirin at home, thus this was given PR in the ED. CT of the head showed questionable foci of acute ischemia involving the right parietal lobe. No intracranial hemorrhage. CTA neck showed Right M1 occlusion with evidence of developing acute ischemia in the right MCA territory. MRA brain showed right MCA M1 occlusion re- identified, with moderate to large acute right MCA infarct. Small volume of petechial hemorrhage. He was seen by neuro who recommended repeat CT head on 6/3 (not done yet), echo which showed 60-65%, GR1DD, septal shunt could not be excluded. No source of cardiac embolic was  identified. On telemetry he has remained in NSR. Troponin negative x 1. Carotid dopplers pending. A1C 5.6%, LDL 88.  He continues to have residual lower sided weakness. Cardiology is consulted for possible TEE.           Past Medical History  Diagnosis Date  . Hypertension   . Pancreatitis   . Diabetes mellitus without complication       Most Recent Cardiac Studies: Echo 10/27/2014:  Study Conclusions  - Left ventricle: The cavity size was normal. Wall thickness was normal. Systolic function was normal. The estimated ejection fraction was in the range of 60% to 65%. Wall motion was normal; there were no regional wall motion abnormalities. Doppler parameters are consistent with abnormal left ventricular relaxation (grade 1 diastolic dysfunction). - Aortic valve: Valve area (Vmax): 3 cm^2. - Atrial septum: A shunt (PFO or ASD) cannot be excluded on the basis of this study.  Impressions:  - Normal study within technical limitations. No significant heart disease. The etiology of the patient&'s symptoms is still not clear. The contrast study was inadequate to rule out shunt. No cardiac source of emboli was indentified.   Surgical History: History reviewed. No pertinent past surgical history.   Home Meds: Prior to Admission medications   Medication Sig Start Date End Date Taking? Authorizing Provider  amLODipine (NORVASC) 10 MG tablet Take 10 mg by mouth daily.   Yes Historical Provider, MD  gemfibrozil (LOPID) 600 MG tablet Take 600 mg by mouth 2 (two) times daily before  a meal.   Yes Historical Provider, MD  meloxicam (MOBIC) 15 MG tablet Take 15 mg by mouth daily.   Yes Historical Provider, MD    Inpatient Medications:  . antiseptic oral rinse  7 mL Mouth Rinse q12n4p  . aspirin  325 mg Oral Daily  . chlorhexidine  15 mL Mouth Rinse BID  . enoxaparin (LOVENOX) injection  40 mg Subcutaneous Q24H  . gemfibrozil  600 mg Oral BID AC  . rosuvastatin  10 mg  Oral q1800      Allergies: No Known Allergies  History   Social History  . Marital Status: Married    Spouse Name: N/A  . Number of Children: N/A  . Years of Education: N/A   Occupational History  . Not on file.   Social History Main Topics  . Smoking status: Current Every Day Smoker  . Smokeless tobacco: Not on file  . Alcohol Use: Yes  . Drug Use: No  . Sexual Activity: Not on file   Other Topics Concern  . Not on file   Social History Narrative     Family History  Problem Relation Age of Onset  . CAD Sister   . Hypertension Sister      Review of Systems: Review of Systems  Constitutional: Positive for malaise/fatigue. Negative for fever, chills, weight loss and diaphoresis.       Weakness involving left upper and lower extremities   Eyes: Negative for blurred vision, double vision, photophobia, pain, discharge and redness.  Respiratory: Negative for cough, hemoptysis, sputum production, shortness of breath and wheezing.   Cardiovascular: Negative for chest pain, palpitations, orthopnea, claudication, leg swelling and PND.  Gastrointestinal: Negative for heartburn, nausea, vomiting, abdominal pain, blood in stool and melena.  Genitourinary: Negative for hematuria.  Musculoskeletal: Positive for falls. Negative for myalgias.  Skin: Negative for rash.  Neurological: Positive for dizziness, sensory change, speech change, focal weakness and weakness. Negative for loss of consciousness.  Psychiatric/Behavioral: Negative for hallucinations and substance abuse. The patient is not nervous/anxious.   All other systems reviewed and are negative.   Labs:  Recent Labs  10/27/14 0525  TROPONINI <0.03   Lab Results  Component Value Date   WBC 4.0 10/27/2014   HGB 13.8 10/27/2014   HCT 40.5 10/27/2014   MCV 81.8 10/27/2014   PLT 175 10/27/2014     Recent Labs Lab 10/27/14 0525  NA 140  K 3.6  CL 109  CO2 24  BUN 8  CREATININE 1.26*  CALCIUM 8.8*  PROT  7.1  BILITOT 1.6*  ALKPHOS 54  ALT 9*  AST 30  GLUCOSE 136*   Lab Results  Component Value Date   CHOL 186 10/28/2014   HDL 37* 10/28/2014   LDLCALC 88 10/28/2014   TRIG 307* 10/28/2014   No results found for: DDIMER  Radiology/Studies:  Ct Angio Head W/cm &/or Wo Cm  10/27/2014   CLINICAL DATA:  Altered mental status.  Right-sided weakness.  EXAM: CT ANGIOGRAPHY HEAD AND NECK  TECHNIQUE: Multidetector CT imaging of the head and neck was performed using the standard protocol during bolus administration of intravenous contrast. Multiplanar CT image reconstructions and MIPs were obtained to evaluate the vascular anatomy. Carotid stenosis measurements (when applicable) are obtained utilizing NASCET criteria, using the distal internal carotid diameter as the denominator.  CONTRAST:  78mL OMNIPAQUE IOHEXOL 350 MG/ML SOLN  COMPARISON:  Noncontrast head CT earlier today. Soft tissue neck CT 04/09/2011.  FINDINGS: CTA NECK  Aortic arch:  3 vessel aortic arch. Brachiocephalic and subclavian arteries are widely patent.  Right carotid system: Common carotid artery is patent without stenosis. Cervical ICA is patent with mild noncalcified plaque proximally but no stenosis.  Left carotid system: Common carotid artery is patent without stenosis. Cervical ICA is patent with minimal noncalcified plaque proximally and no stenosis.  Vertebral arteries: Vertebral arteries are patent with the left being dominant. No vertebral artery stenosis is identified.  Skeleton: Mild multilevel cervical spondylosis.  Other neck: No neck mass.  CTA HEAD  Anterior circulation: Internal carotid arteries are patent from skullbase to carotid termini without stenosis. There is occlusion of the proximal to mid right M1 segment just beyond the origin of the anterior temporal artery. There is very mild collateral supply of more distal right MCA branch vessels. Left MCA is unremarkable. ACAs are unremarkable. No intracranial aneurysm is  identified.  Posterior circulation: The intracranial vertebral arteries are patent to the basilar with the left being dominant. AICA origins are patent with the right being dominant. Right PICA shares a common origin with the right AICA. Left PICA is not clearly identified. SCA origins are patent. Basilar artery is patent without stenosis. There is a small patent right posterior communicating artery. PCAs are unremarkable.  Venous sinuses: Patent.  Anatomic variants: None.  Delayed phase: There is evidence of developing cytotoxic edema in the right MCA territory involving the insula and adjacent frontal, temporal, and parietal lobes.  IMPRESSION: 1. Right M1 occlusion with evidence of developing acute ischemia in the right MCA territory. 2. No carotid artery or vertebral artery stenosis.  Critical Value/emergent results were called by telephone at the time of interpretation on 10/27/2014 at 9:11 am to Dr. Jimmye Norman, who verbally acknowledged these results.   Electronically Signed   By: Logan Bores   On: 10/27/2014 09:14   Dg Chest 2 View  10/27/2014   CLINICAL DATA:  Subsequent encounter for stroke  EXAM: CHEST  2 VIEW  COMPARISON:  01/25/2014.  FINDINGS: 1345 hours. Low volume, lordotic film without edema or focal airspace consolidation. The cardio pericardial silhouette is enlarged. Multiple old left-sided rib fractures again noted. Telemetry leads overlie the chest.  IMPRESSION: No active cardiopulmonary disease.   Electronically Signed   By: Misty Stanley M.D.   On: 10/27/2014 14:20   Ct Head Wo Contrast  10/27/2014   CLINICAL DATA:  Code stroke. Right facial droop. "No movement on the left. "  EXAM: CT HEAD WITHOUT CONTRAST  TECHNIQUE: Contiguous axial images were obtained from the base of the skull through the vertex without intravenous contrast.  COMPARISON:  10/04/2013  FINDINGS: There is a subcentimeter focal hypodensity in the right parietal cortex that was not definitively seen on prior. Question  minimal subcortical hypodensity in the adjacent parietal lobe.No intracranial hemorrhage, mass effect, or midline shift. No hydrocephalus. The basilar cisterns are patent. No intracranial fluid collection. Calvarium is intact. Minimal mucosal thickening involving the left ethmoid air cells and frontal sinus, remaining paranasal sinuses are well-aerated. Mastoid air cells are clear.  IMPRESSION: Questionable foci of acute ischemia involving the right parietal lobe. No intracranial hemorrhage.  These results were called by telephone at the time of interpretation on 10/27/2014 at 5:48 am to Dr. Charlesetta Ivory , who verbally acknowledged these results.   Electronically Signed   By: Jeb Levering M.D.   On: 10/27/2014 05:48   Ct Angio Neck W/cm &/or Wo/cm  10/27/2014   CLINICAL DATA:  Altered mental status.  Right-sided weakness.  EXAM: CT ANGIOGRAPHY HEAD AND NECK  TECHNIQUE: Multidetector CT imaging of the head and neck was performed using the standard protocol during bolus administration of intravenous contrast. Multiplanar CT image reconstructions and MIPs were obtained to evaluate the vascular anatomy. Carotid stenosis measurements (when applicable) are obtained utilizing NASCET criteria, using the distal internal carotid diameter as the denominator.  CONTRAST:  19mL OMNIPAQUE IOHEXOL 350 MG/ML SOLN  COMPARISON:  Noncontrast head CT earlier today. Soft tissue neck CT 04/09/2011.  FINDINGS: CTA NECK  Aortic arch: 3 vessel aortic arch. Brachiocephalic and subclavian arteries are widely patent.  Right carotid system: Common carotid artery is patent without stenosis. Cervical ICA is patent with mild noncalcified plaque proximally but no stenosis.  Left carotid system: Common carotid artery is patent without stenosis. Cervical ICA is patent with minimal noncalcified plaque proximally and no stenosis.  Vertebral arteries: Vertebral arteries are patent with the left being dominant. No vertebral artery stenosis is  identified.  Skeleton: Mild multilevel cervical spondylosis.  Other neck: No neck mass.  CTA HEAD  Anterior circulation: Internal carotid arteries are patent from skullbase to carotid termini without stenosis. There is occlusion of the proximal to mid right M1 segment just beyond the origin of the anterior temporal artery. There is very mild collateral supply of more distal right MCA branch vessels. Left MCA is unremarkable. ACAs are unremarkable. No intracranial aneurysm is identified.  Posterior circulation: The intracranial vertebral arteries are patent to the basilar with the left being dominant. AICA origins are patent with the right being dominant. Right PICA shares a common origin with the right AICA. Left PICA is not clearly identified. SCA origins are patent. Basilar artery is patent without stenosis. There is a small patent right posterior communicating artery. PCAs are unremarkable.  Venous sinuses: Patent.  Anatomic variants: None.  Delayed phase: There is evidence of developing cytotoxic edema in the right MCA territory involving the insula and adjacent frontal, temporal, and parietal lobes.  IMPRESSION: 1. Right M1 occlusion with evidence of developing acute ischemia in the right MCA territory. 2. No carotid artery or vertebral artery stenosis.  Critical Value/emergent results were called by telephone at the time of interpretation on 10/27/2014 at 9:11 am to Dr. Jimmye Norman, who verbally acknowledged these results.   Electronically Signed   By: Logan Bores   On: 10/27/2014 09:14   Mr Brain Wo Contrast  10/27/2014   ADDENDUM REPORT: 10/27/2014 14:29  ADDENDUM: Study discussed by telephone with Dr. Max Sane on 10/27/2014 at 1424 hrs.   Electronically Signed   By: Genevie Ann M.D.   On: 10/27/2014 14:29   10/27/2014   CLINICAL DATA:  59 year old male who fell this morning with right facial droop and loss of left side movement. Right M1 MCA occlusion. Initial encounter.  EXAM: MRI HEAD WITHOUT CONTRAST  MRA HEAD  WITHOUT CONTRAST  TECHNIQUE: Multiplanar, multiecho pulse sequences of the brain and surrounding structures were obtained without intravenous contrast. Angiographic images of the head were obtained using MRA technique without contrast.  COMPARISON:  CTA head and neck 0817 hr today.  FINDINGS: MRI HEAD FINDINGS  Confluent restricted diffusion in the right MCA territory. Changes are most concentrated at the insula, operculum, and right basal ganglia. Estimated volume of restricted diffusion 175 mL.  Developing cytotoxic edema. Trace petechial hemorrhage (series 9, image 17). Mild mass effect on the right lateral ventricle, no midline shift at this point.  Abnormal increased FLAIR signal in the right MCA branches. Other Major intracranial  vascular flow voids are preserved.  No contralateral left hemisphere or posterior fossa restricted diffusion. Aside from the acute signal changes there are scattered small subcortical white matter foci of T2 and FLAIR hyperintensity in both hemispheres. The thalami, brainstem, and cerebellum are within normal limits.  No ventriculomegaly. Normal basilar cisterns. Negative pituitary, cervicomedullary junction and visualized cervical spine. Visible internal auditory structures appear normal. Mastoids are clear. Chronic left orbit fracture. Mild paranasal sinus mucosal thickening. Mildly dysconjugate gaze. Visualized scalp soft tissues are within normal limits. Normal bone marrow signal.  MRA HEAD FINDINGS  Antegrade flow in the posterior circulation with dominant distal left vertebral artery. Patent vertebrobasilar junction and basilar artery. Normal SCA and PCA origins. Right posterior communicating artery appears normal. Bilateral PCA branches are normal.  Antegrade flow in both ICA siphons. No siphon stenosis. Patent carotid termini. Ophthalmic artery origins are normal. Left MCA and bilateral ACA origins and visualized branches are within normal limits.  Right MCA M1 occlusion about  8 mm beyond its origin. No distal right MCA flow signal detected.  IMPRESSION: 1. Right MCA M1 occlusion re- identified, with moderate to large acute right MCA infarct (infarct core volume estimated at 175 mL). 2. Small volume petechial hemorrhage. Developing cytotoxic edema with only mild mass effect at this time (no midline shift). Noncontrast head CT can be utilized for surveillance of intracranial mass effect. 3. Otherwise negative intracranial MRA. 4. Underlying mild for age nonspecific cerebral white matter changes.  Electronically Signed: By: Genevie Ann M.D. On: 10/27/2014 14:16   Mr Jodene Nam Head/brain Wo Cm  10/27/2014   ADDENDUM REPORT: 10/27/2014 14:29  ADDENDUM: Study discussed by telephone with Dr. Max Sane on 10/27/2014 at 1424 hrs.   Electronically Signed   By: Genevie Ann M.D.   On: 10/27/2014 14:29   10/27/2014   CLINICAL DATA:  59 year old male who fell this morning with right facial droop and loss of left side movement. Right M1 MCA occlusion. Initial encounter.  EXAM: MRI HEAD WITHOUT CONTRAST  MRA HEAD WITHOUT CONTRAST  TECHNIQUE: Multiplanar, multiecho pulse sequences of the brain and surrounding structures were obtained without intravenous contrast. Angiographic images of the head were obtained using MRA technique without contrast.  COMPARISON:  CTA head and neck 0817 hr today.  FINDINGS: MRI HEAD FINDINGS  Confluent restricted diffusion in the right MCA territory. Changes are most concentrated at the insula, operculum, and right basal ganglia. Estimated volume of restricted diffusion 175 mL.  Developing cytotoxic edema. Trace petechial hemorrhage (series 9, image 17). Mild mass effect on the right lateral ventricle, no midline shift at this point.  Abnormal increased FLAIR signal in the right MCA branches. Other Major intracranial vascular flow voids are preserved.  No contralateral left hemisphere or posterior fossa restricted diffusion. Aside from the acute signal changes there are scattered small  subcortical white matter foci of T2 and FLAIR hyperintensity in both hemispheres. The thalami, brainstem, and cerebellum are within normal limits.  No ventriculomegaly. Normal basilar cisterns. Negative pituitary, cervicomedullary junction and visualized cervical spine. Visible internal auditory structures appear normal. Mastoids are clear. Chronic left orbit fracture. Mild paranasal sinus mucosal thickening. Mildly dysconjugate gaze. Visualized scalp soft tissues are within normal limits. Normal bone marrow signal.  MRA HEAD FINDINGS  Antegrade flow in the posterior circulation with dominant distal left vertebral artery. Patent vertebrobasilar junction and basilar artery. Normal SCA and PCA origins. Right posterior communicating artery appears normal. Bilateral PCA branches are normal.  Antegrade flow in both ICA siphons.  No siphon stenosis. Patent carotid termini. Ophthalmic artery origins are normal. Left MCA and bilateral ACA origins and visualized branches are within normal limits.  Right MCA M1 occlusion about 8 mm beyond its origin. No distal right MCA flow signal detected.  IMPRESSION: 1. Right MCA M1 occlusion re- identified, with moderate to large acute right MCA infarct (infarct core volume estimated at 175 mL). 2. Small volume petechial hemorrhage. Developing cytotoxic edema with only mild mass effect at this time (no midline shift). Noncontrast head CT can be utilized for surveillance of intracranial mass effect. 3. Otherwise negative intracranial MRA. 4. Underlying mild for age nonspecific cerebral white matter changes.  Electronically Signed: By: Genevie Ann M.D. On: 10/27/2014 14:16    EKG: NSR, 70 bpm, LVH, st elevation involving anterolateral leads with nonspecific st/t wave changes involving inferior leads   Weights: Filed Weights   10/27/14 0606 10/28/14 0428 10/29/14 0428  Weight: 165 lb (74.844 kg) 197 lb 9.6 oz (89.631 kg) 197 lb 6.4 oz (89.54 kg)     Physical Exam: Blood pressure  113/83, pulse 76, temperature 98.6 F (37 C), temperature source Oral, resp. rate 18, height 5\' 6"  (1.676 m), weight 197 lb 6.4 oz (89.54 kg), SpO2 98 %. Body mass index is 31.88 kg/(m^2). General: Well developed, well nourished, in no acute distress. Head: Normocephalic, atraumatic, sclera non-icteric, no xanthomas, nares are without discharge. Left sided facial droop.   Neck: Negative for carotid bruits. JVD not elevated. Lungs: Clear bilaterally to auscultation without wheezes, rales, or rhonchi. Breathing is unlabored. Heart: RRR with S1 S2. No murmurs, rubs, or gallops appreciated. Abdomen: Soft, non-tender, non-distended with normoactive bowel sounds. No hepatomegaly. No rebound/guarding. No obvious abdominal masses. Msk: Tone appears normal for age. Right sided strength normal.  Extremities: No clubbing or cyanosis. No edema. Left upper extremity paralysis. Left lower extremity strength 3/5.  Neuro: Alert and oriented X 3. No facial asymmetry. No focal deficit. Moves all extremities spontaneously. Psych:  Responds to questions appropriately with a normal affect.    Assessment and Plan:  59 y.o. male with h/o DM2, HTN not well controlled, and pancreatitis who presented to [facility] on 6/1 after suffering a fall and was found to have left sided weakness. He was found to have acute ischemic stroke involving the right MCA.  1. Ischemic stroke: -Per patient's family he stopped taking all of his medications prior to the events that led to his admission  -Presenting BP 134/99  -TTE did not reveal obvious source of cardiac embolic, study inadequate to rule out shunt  -TEE would be study of choice to evaluate for cardiac emboli and possible shunt. Could schedule for Monday morning (6/6) if patient remains in the hospital over the weekend. If primary team deems him ok to be discharged could be done as an outpatient  -No signs of a-fib on telemetry, may need 30 day event monitor outpatient cardiac  monitoring if no arrhythmia is seen while inpatient  -Continue aspirin  -Lipitor, goal LDL <70 -PT/OT/speech pathology   2. HTN: -Apparently he stopped all of his medication prior to admission -BP has been stable while not on any antihypertensives -Continue to monitor  3. Dispo: -Will need rehab   Signed, Christell Faith, PA-C Pager: [phone] 10/29/2014, 2:13 PM

## 2014-10-29 NOTE — Evaluation (Signed)
Speech Language Pathology Evaluation Patient Details Name: Daniel [name] MRN: [id number] DOB: 01/10/1956 Today's Date: 10/29/2014 Time: 0905-1005 SLP Time Calculation (min) (ACUTE ONLY): 60 min  Problem List:  Patient Active Problem List   Diagnosis Date Noted  . Stroke 10/27/2014  . Lymphadenitis 04/09/2011  . HTN (hypertension) 04/09/2011  . DM (diabetes mellitus) 04/09/2011   Past Medical History:  Past Medical History  Diagnosis Date  . Hypertension   . Pancreatitis   . Diabetes mellitus without complication    Past Surgical History: History reviewed. No pertinent past surgical history. HPI:  pt presents w/ moderate-severe Left sided neglect only coming to approx. midline+. Educated wife and pt on giving stimulation to look to the Left; Wife is sitting on pt's Left in the room, windows on his Left as well. Noted pt often yawning and closing eyes requring cues to realert and attend. Wife tends to try to answer for him. Noted per chart notes, pt uses ETOH regularly.    Assessment / Plan / Recommendation Clinical Impression  Pt was presented w/ tasks of the MOCA(Montreal Cognitive Assessment-B). He was drowsy and required verbal/tactile cues intermittently to realert and attend to tasks; pt frequently yawned spontaneously. He answered basic y/n questions and was able to follow along w/ concrete tasks of the [facility] w/ less effort but frequently paused and required verbal cues of repetition and encouragement to initiate a verbal response for other tasks of more complexity. Pt was able name basic objects of a category (10); recall 3/3 word/object immediately and 2/3 w/ delay; calculate simple math problem x2/2; and name pictured objects 3/4 - however, was hampered by visual/Left neglect deficits. Tasks of Executive Function and Attention were most challenging for pt even when given verbal cues/prompts.  Pt appears to present w/ moderate Cognitive-linguistic deficits and would benefit from  continued skilled ST services in order to improve Cognitive function in ADLs.     SLP Assessment  Patient needs continued Speech Lanaguage Pathology Services    Follow Up Recommendations  Inpatient Rehab;Skilled Nursing facility    Frequency and Duration min 3x week  1 week   Pertinent Vitals/Pain Pain Assessment: No/denies pain   SLP Goals  Patient/Family Stated Goal: "he's going to get better" Potential to Achieve Goals (ACUTE ONLY): Fair (-Good) Potential Considerations (ACUTE ONLY): Severity of impairments;Family/community support  SLP Evaluation Prior Functioning  Cognitive/Linguistic Baseline: Within functional limits (per Wife, "He knows his Math". (She was doing the bills.)) Type of Home: House  Lives With: Spouse Available Help at Discharge:  (Wife) Education: High School Vocation: Unemployed (per report)   Cognition  Overall Cognitive Status: Impaired/Different from baseline Arousal/Alertness:  (drowsy) Orientation Level: Oriented to person;Oriented to place Attention: Alternating Alternating Attention: Impaired Alternating Attention Impairment: Verbal complex Memory: Appears intact Awareness: Impaired Problem Solving: Impaired Problem Solving Impairment: Verbal complex Executive Function: Organizing;Initiating;Self Monitoring;Reasoning Reasoning: Impaired Reasoning Impairment: Verbal complex Organizing: Impaired Organizing Impairment: Verbal complex Initiating: Impaired Initiating Impairment: Verbal complex (drowsy) Self Monitoring: Impaired Self Monitoring Impairment:  (impulsive; decreased attention to task; drowsy) Behaviors: Impulsive (drowsy) Safety/Judgment: Impaired Comments:  (impulsivity has been noted)    Comprehension  Auditory Comprehension Overall Auditory Comprehension: Impaired Yes/No Questions: Within Functional Limits (basic questions; not complex questions) Conversation:  (too drowsy to follow along; poor  initiation) EffectiveTechniques: Repetition;Visual/Gestural cues Reading Comprehension Reading Status: Not tested    Expression Verbal Expression Overall Verbal Expression: Impaired Initiation: Impaired Written Expression Dominant Hand: Right Written Expression: Not tested   Oral / Motor  Motor Speech Overall Motor Speech: Impaired (Dysarthria) Respiration: Within functional limits Phonation: Normal   GO     Daniel [name] 10/29/2014, 10:01 PM

## 2014-10-29 NOTE — Care Management (Signed)
Received call from [place] at [facility]-  Patient's insurance has approved inpatient rehab.  Information also sent and received by [facility].  They have also initiated auth process.  [place] is a level1 in the network for [place].  Out of pocket would be less than [facility] which is not in network.  WExplained to Daniel [name] and she verbalizes understanding.  Also says she does not have an alternate phone number .  Will anticipate discharge to one of the acute inpatient rehabs hopefully 11/01/2014

## 2014-10-29 NOTE — Progress Notes (Signed)
Speech Language Pathology Treatment: Dysphagia  Patient Details Name: EZEKIAL ARNS MRN: [id number] DOB: [birth year] Today's Date: 10/29/2014 Time: 0830-0900 SLP Time Calculation (min) (ACUTE ONLY): 30 min  Assessment / Plan / Recommendation Clinical Impression  Pt appears to present w/ inconsistent, s/s of aspiration w/ thin liquid consistency when drinking; pt's ability to safely consume liquids as well as protect his airway (w/ cough) is greatly impacted by his drowsy presentation and the fact he needs verbal/tactile cues to realert/reattend to task of oral intake intermittently. Pt appears at increased risk for aspiration and would benefit from modification of diet to a Dys. I w/ Nectar liquids at this time; strict aspiration precautions and monitoring during all oral intake - only giving po's when fully awake/alert. NSG updated. Wife/pt agreed.   HPI Other Pertinent Information: pt continues to present w/ moderate-severe Left sided neglect only coming to approx. midline+. Noted pt often yawning and closing eyes requring cues to realert and attend. Wife tends to try to answer for him. NSG reported pt coughing w/ thin liquids this AM at breakfast meal.    Pertinent Vitals Pain Assessment: No/denies pain  SLP Plan  Goals updated    Recommendations Diet recommendations: Dysphagia 1 (puree);Nectar-thick liquid Liquids provided via: Cup;Straw Medication Administration: Crushed with puree Supervision: Full supervision/cueing for compensatory strategies Compensations: Slow rate;Small sips/bites;Check for pocketing;Check for anterior loss Postural Changes and/or Swallow Maneuvers: Seated upright 90 degrees              General recommendations:  (Dietician consult d/t Dysphagia diet rec. ) Oral Care Recommendations: Oral care BID;Oral care before and after PO;Staff/trained caregiver to provide oral care Follow up Recommendations: Inpatient Rehab;Skilled Nursing facility Plan: Goals updated     GO     Teyana Pierron 10/29/2014, 10:10 PM

## 2014-10-29 NOTE — Progress Notes (Signed)
[facility] inpatient rehab admissions - I spoke with [employer] insurance case manager this am.  We need documentation from therapies that patient can tolerate and benefit from 3 hours of therapy a day.  We also need documentation from MD or therapy that patient needs close medical supervision and a coordinated approach to his therapies.  Call me for questions.  #[phone]

## 2014-10-29 NOTE — Plan of Care (Signed)
Problem: SLP Dysphagia Goals Goal: Misc Dysphagia Goal Pt will safely tolerate po diet of least restrictive consistency w/ no overt s/s of aspiration noted by Staff/pt/family x3 sessions.    

## 2014-10-29 NOTE — Progress Notes (Signed)
Pt. Alert and oriented. VSS. Pt. flacid in left arm. Slurred speech and slow to respond. Pills crushed with applesauce. Pt. C/o headache last night relieved with tylenol. Pt. Had incontinent episode this morning. Running SR per telemetry monitor. Wife at the bedside. Rested quietly throughout the night.

## 2014-10-29 NOTE — Progress Notes (Signed)
Physical Therapy Treatment Patient Details Name: Daniel [name] MRN: [id number] DOB: Oct 20, 1955 Today's Date: 10/29/2014    History of Present Illness This patient is a 59 year old male who came to [facility] suffering a R MCA CVA    PT Comments    Pt is making good progress towards goals with increased participation with therapy this session. Pt is able to tolerate longer sessions each day and is making progress towards goals including transfers to recliner this date. Pt still requires heavy +2 assist and would benefit from aggressive therapy at CIR. Pt still presents with limited strength in L UE, however able to assist with R UE. Tactile cues given during there-ex for L LE. Pt motivated to perform therapy and smiles once he's in the chair.  Follow Up Recommendations  CIR     Equipment Recommendations       Recommendations for Other Services Rehab consult     Precautions / Restrictions Precautions Precautions: None Restrictions Weight Bearing Restrictions: No    Mobility  Bed Mobility Overal bed mobility: Needs Assistance;+2 for physical assistance Bed Mobility: Supine to Sit           General bed mobility comments: supine->sit with mod assist. Cues given for sequencing including using R arm on railing. Once seated, pt able to sit with supervision demonstrating upright posture.  Transfers Overall transfer level: Needs assistance Equipment used: 2 person hand held assist Transfers: Stand Pivot Transfers           General transfer comment: 1st attempt with platform rw, max assist + 2. Pt unable to use AD correctly with heavy L lateral leaning noted during standing. 2nd attempt with +2 max assist using B HHA, pt able to stand. No buckling noted, however pt very unsteady. Pt then performed stand pivot transfer to recliner with max assist + 2 and HHA.   Ambulation/Gait             General Gait Details: not safe to assess this date   Stairs             Wheelchair Mobility    Modified Rankin (Stroke Patients Only)       Balance                                    Cognition Arousal/Alertness:  (sleepy, however easily arousable) Behavior During Therapy: WFL for tasks assessed/performed Overall Cognitive Status: Within Functional Limits for tasks assessed                      Exercises Other Exercises Other Exercises: Pt able to perform supine ther-ex with L LE including heel slides, ankle pumps, SLRs, and hip abd/add. Heavy cues for participation required as pt has some inattention to L side. All ther-ex performed x 10 reps. Pt also performed AAROM using R hand to assist L hand in shoulder flexion and adduction. Exercises performed with min assist. Further ther-ex performed on R UE/LE including bicep curls and SLRs. Safe technique performed.    General Comments        Pertinent Vitals/Pain Pain Assessment: No/denies pain    Home Living                      Prior Function            PT Goals (current goals can now be found in the care  plan section) Acute Rehab PT Goals Patient Stated Goal: to get out of bed PT Goal Formulation: With patient/family Time For Goal Achievement: 11/10/14 Potential to Achieve Goals: Good Progress towards PT goals: Progressing toward goals    Frequency  7X/week    PT Plan Current plan remains appropriate    Co-evaluation             End of Session Equipment Utilized During Treatment: Gait belt Activity Tolerance: Patient tolerated treatment well Patient left: in chair;with chair alarm set     Time: 1117-1150 PT Time Calculation (min) (ACUTE ONLY): 33 min  Charges:  $Therapeutic Exercise: 8-22 mins $Therapeutic Activity: 8-22 mins                    G Codes:      Abigael Mogle 2014/11/22, 1:14 PM  Greggory Stallion, PT, DPT [phone]

## 2014-10-30 MED ORDER — ASPIRIN EC 325 MG PO TBEC
DELAYED_RELEASE_TABLET | ORAL | Status: AC
  Administered 2014-10-30: 325 mg
  Filled 2014-10-30: qty 1

## 2014-10-30 NOTE — Progress Notes (Signed)
Patient ID: CHOICE KLEINSASSER, male   DOB: [birth year], 59 y.o.   MRN: [id number] [facility] PROGRESS NOTE  PCP: Marden Noble, MD  HPI/Subjective: Patient was able to lift his left leg up off the bed today. Patient was able to slightly flex at the left elbow twice today. Patient feels okay. Offers no complaints. Did better with the swallowing today.  Objective: Filed Vitals:   10/30/14 1220  BP: 128/84  Pulse: 69  Temp: 99.3 F (37.4 C)  Resp: 18    Intake/Output Summary (Last 24 hours) at 10/30/14 1359 Last data filed at 10/30/14 1000  Gross per 24 hour  Intake      0 ml  Output    200 ml  Net   -200 ml   Filed Weights   10/28/14 0428 10/29/14 0428 10/30/14 0529  Weight: 89.631 kg (197 lb 9.6 oz) 89.54 kg (197 lb 6.4 oz) 88.587 kg (195 lb 4.8 oz)    ROS: Review of Systems  Constitutional: Negative for fever and chills.  Eyes: Negative for blurred vision.  Respiratory: Negative for cough and shortness of breath.   Cardiovascular: Negative for chest pain.  Gastrointestinal: Negative for nausea, vomiting, abdominal pain, diarrhea and constipation.  Genitourinary: Negative for dysuria.  Musculoskeletal: Negative for joint pain.  Neurological: Positive for focal weakness. Negative for dizziness and headaches.   Exam: Physical Exam  HENT:  Nose: No mucosal edema.  Mouth/Throat: No oropharyngeal exudate or posterior oropharyngeal edema.  Eyes: Conjunctivae, EOM and lids are normal. Pupils are equal, round, and reactive to light.  Neck: No JVD present. Carotid bruit is not present. No edema present. No thyroid mass and no thyromegaly present.  Cardiovascular: S1 normal and S2 normal.  Exam reveals no gallop.   No murmur heard. Pulses:      Dorsalis pedis pulses are 2+ on the right side, and 2+ on the left side.  Respiratory: No respiratory distress. He has no wheezes. He has no rhonchi. He has no rales.  GI: Soft. Bowel sounds are normal. There is no  tenderness.  Musculoskeletal: Normal range of motion.       Right ankle: He exhibits no swelling.       Left ankle: He exhibits no swelling.  Lymphadenopathy:    He has no cervical adenopathy.  Neurological: He is alert. No cranial nerve deficit.  Left arm slightly able to flex at left elbow. Left leg weakness 3+ out of 5 strength.  Skin: Skin is warm. No rash noted. Nails show no clubbing.  Psychiatric: He has a normal mood and affect.    Scheduled Meds: . antiseptic oral rinse  7 mL Mouth Rinse q12n4p  . aspirin  325 mg Oral Daily  . chlorhexidine  15 mL Mouth Rinse BID  . enoxaparin (LOVENOX) injection  40 mg Subcutaneous Q24H  . gemfibrozil  600 mg Oral BID AC  . rosuvastatin  10 mg Oral q1800    Assessment/Plan: 1. Acute stroke with left arm paralysis and left leg weakness and dysphasia. Patient also has some edema and petechial hemorrhage. - Appreciate neurology evaluation. Patient's family had refused TPA on admission. Continue aspirin and Crestor. - Appreciate physical therapy, occupational therapy, speech pathology consultations.  - Patient will be a candidate for acute rehab - Echo could not rule out PFO or ASD- will get TEE Monday morning. - Patient needs close medical supervision and a coordinated approach to therapies. 2. Hypertriglyceridemia- the patient is on gemfibrozil. 3. Essential  hypertension- blood pressure is on the lower range off his usual medication. Continue to watch off blood pressure medication at this time.  Code Status:     Code Status Orders        Start     Ordered   10/27/14 1202  Full code   Continuous     10/27/14 1201     Family Communication: Wife at the bedside Disposition Plan: Will need Acute rehabilitation, potentially Monday afternoon.  Consultants:  Dr. Irish Elders neurology, PT, OT, speech pathology  Time spent: 20 minutes  Loletha Grayer  [facility]

## 2014-10-30 NOTE — Progress Notes (Signed)
Physical Therapy Treatment Patient Details Name: Daniel [name] MRN: [id number] DOB: 21-May-1956 Today's Date: 10/30/2014    History of Present Illness This patient is a 59 year old male who came to [facility] suffering a R MCA CVA.    PT Comments    Pt tolerating treatment moderately well, motivated and able to complete most of PT sesssion as planned. Pt continues to make progress toward goals as evidenced by improved number of transfers and increased stance time. Pt's greatest limitation continues to be hemiplegia which continues to limit ability to perform functional mobility and ADL at baseline function. Patient presenting with impairment of strength, weakness, balance, and activity tolerance, limiting ability to perform ADL and mobility tasks at  baseline level of function. Patient will benefit from skilled intervention to address the above impairments and limitations, in order to restore to prior level of function, improve patient safety upon discharge, and to decrease caregiver burden.    Follow Up Recommendations  CIR     Equipment Recommendations  Other (comment) (TO be determined at DC location. )    Recommendations for Other Services Rehab consult     Precautions / Restrictions Precautions Precautions: None Restrictions Weight Bearing Restrictions: No    Mobility  Bed Mobility Overal bed mobility: Needs Assistance;+2 for physical assistance Bed Mobility: Supine to Sit           General bed mobility comments: supine->sit with mod assist. Cues given for sequencing including using R arm on railing. Once seated, pt able to sit with supervision demonstrating upright posture for 2 minutes, but then begins to fatigue quickly and collapse to the L .   Transfers Overall transfer level: Needs assistance Equipment used: 2 person hand held assist Transfers: Sit to/from [facility] Sit to Stand: Mod assist (Mod to stand; MaxA for sustained weight shifting L/R, 5x  60sec.) Stand pivot transfers: Max assist (Limited ability to take small steps even with max assist for weight shifting. )          Ambulation/Gait             General Gait Details: not safe to assess this date   Stairs            Wheelchair Mobility    Modified Rankin (Stroke Patients Only)       Balance Overall balance assessment: Needs assistance Sitting-balance support: Single extremity supported Sitting balance-Leahy Scale: Fair Sitting balance - Comments: poor endurance.  Postural control: Left lateral lean Standing balance support: Single extremity supported Standing balance-Leahy Scale: Poor Standing balance comment: requires knee block on L to prevent buckling. req maxA for weight shift.                     Cognition Arousal/Alertness: Awake/alert Behavior During Therapy: Flat affect (Non verbal for most of treatment. ) Overall Cognitive Status: Impaired/Different from baseline Area of Impairment: Attention;Following commands   Current Attention Level: Alternating                Exercises      General Comments        Pertinent Vitals/Pain Pain Assessment: No/denies pain    Home Living                      Prior Function            PT Goals (current goals can now be found in the care plan section) Acute Rehab PT Goals Patient Stated Goal:  to get out of bed PT Goal Formulation: With patient/family Time For Goal Achievement: 11/10/14 Potential to Achieve Goals: Good Additional Goals Additional Goal #1: Pt will be able to perform bed mobility/transfers with min assist and LRAD in order to improve functional mobility. Additional Goal #2: Pt will be able to ambulate 100' with LRAD and min assist with safe technique in order to navigate home environment. Progress towards PT goals: Progressing toward goals    Frequency  7X/week    PT Plan Current plan remains appropriate    Co-evaluation             End  of Session Equipment Utilized During Treatment: Gait belt Activity Tolerance: Patient limited by fatigue;Patient limited by lethargy (Pt reports being very woorn out after multiple transfers. ) Patient left: in chair;with family/visitor present;with call bell/phone within reach     Time: 1600-1634 PT Time Calculation (min) (ACUTE ONLY): 34 min  Charges:  $Therapeutic Activity: 23-37 mins                    G Codes:      Buccola,Allan C November 19, 2014, 4:41 PM  4:43 PM  Etta Grandchild, PT, DPT [place] License # [id number]

## 2014-10-30 NOTE — Progress Notes (Signed)
Pt remains a/o though flat of affect. Wife at bedside all day. Worked with phys therapy. Got up to chair. Painfree. Taking meds crushed in sauce.

## 2014-10-30 NOTE — Progress Notes (Signed)
Speech Language Pathology Treatment: Cognitive-Linquistic  Patient Details Name: Daniel [name] MRN: [id number] DOB: [birth year] Today's Date: 10/30/2014 Time: 1856-3149 SLP Time Calculation (min) (ACUTE ONLY): 30 min  Assessment / Plan / Recommendation      HPI Other Pertinent Information: pt l sided neglect appears stable. ST facilitated activities to increase pt functional visual perception and attention/ problem solving tasks. pt able to complete visual field activities w2ith st cues for attention with 75% acc. pt completed attention and problem solving activities with mod cues with 33% acc. Wife present for session and was consistantly attempting to assist pt even with st education to allow pt time to attempt activitiy independently.    Pertinent Vitals Pain Assessment: No/denies pain  SLP Plan  Continue with current plan of care    Recommendations                Plan: Continue with current plan of care    [place] 10/30/2014, 12:01 PM

## 2014-10-31 NOTE — Progress Notes (Signed)
Initial Nutrition Assessment  DOCUMENTATION CODES:     INTERVENTION: Medical Nutrition Supplement: Magic Cup with meals Meals and Snacks: Cater to patient preferences as able to with current diet Magic cup, Snacks  NUTRITION DIAGNOSIS:  Inadequate oral intake related to acute illness as evidenced by meal completion < 25%.  GOAL:  Patient will meet greater than or equal to 90% of their needs  MONITOR:   (Energy Intake, Supplement Acceptance, Electrolyte and Renal Profile)  REASON FOR ASSESSMENT:   (RD Screen- Diet Order)    ASSESSMENT:  Reason For Admission: Stroke PMHx:  Past Medical History  Diagnosis Date  . Hypertension   . Pancreatitis   . Diabetes mellitus without complication     Typical Fluid/ Food Intake: Poor PO intake of meals while in hospital; patient's wife reports that patient doesn't like the puree foods except ice cream and mashed potatoes. Meal/ Snack Patterns: Wife reports that patient was eating a "regular diet" at home with no restrictions before admission. Good appetite and intake reported. Supplements: None  Labs:  Electrolyte and Renal Profile:  Recent Labs Lab 10/27/14 0525  BUN 8  CREATININE 1.26*  NA 140  K 3.6   Protein Profile:  Recent Labs Lab 10/27/14 0525  ALBUMIN 3.8   Meds: reviewed  Physical Findings: Nutrition-Focused physical exam completed. Findings are WDL for fat depletion, muscle depletion, and edema.  Weight Changes: Wife reports a UBW of around 203#, last known about 1 month ago. REviewed previous medical records x 1 year ago- pt weight was 214.7#. Some weight loss noted over past year- not significant.  Height:  Ht Readings from Last 1 Encounters:  10/27/14 5\' 6"  (1.676 m)    Weight:  Wt Readings from Last 1 Encounters:  10/31/14 193 lb 12.8 oz (87.907 kg)    Ideal Body Weight:     Wt Readings from Last 10 Encounters:  10/31/14 193 lb 12.8 oz (87.907 kg)  10/27/14 165 lb (74.844 kg)  04/09/11  210 lb (95.255 kg)    BMI:  Body mass index is 31.3 kg/(m^2).  Estimated Nutritional Needs:  Kcal:  1062-6948 kcal/ day (BEE: 1635 x 1.2 AF x 1.0-1.2 IF)  Protein:  105-123 g Pro/ day (1.2-1.4 g Pro/ kg)  Fluid:  2193-2632 ml/ day (25-30 ml/ kg)  Skin:  Reviewed, no issues  Diet Order:  DIET - DYS 1 Room service appropriate?: Yes; Fluid consistency:: Nectar Thick  EDUCATION NEEDS:  No education needs identified at this time   Intake/Output Summary (Last 24 hours) at 10/31/14 1244 Last data filed at 10/31/14 0830  Gross per 24 hour  Intake      0 ml  Output      0 ml  Net      0 ml    Last BM:  5/31  Roda Shutters, RDN Pager: [phone] Office: [place]

## 2014-10-31 NOTE — Progress Notes (Signed)
Physical Therapy Treatment Patient Details Name: SEVERIANO UTSEY MRN: [id number] DOB: [birth year] Today's Date: 10/31/2014    History of Present Illness This patient is a 59 year old male who came to [facility] suffering a R MCA CVA.    PT Comments    Pt continues to be very limited with what he is able to do, but shows good effort t/o session and appears to be able to use his L LE better today.  Pt still needing extensive assist with standing and leans heavily to the L with a pusher-type force.    Follow Up Recommendations  CIR     Equipment Recommendations       Recommendations for Other Services       Precautions / Restrictions Restrictions Weight Bearing Restrictions: No    Mobility  Bed Mobility Overal bed mobility: Needs Assistance Bed Mobility: Supine to Sit;Sit to Supine     Supine to sit: Max assist Sit to supine: Max assist   General bed mobility comments: Pt able to show some effort, but is heavily leaning to the L the entire time and is unable to really straighten up   Transfers Overall transfer level: Needs assistance Equipment used: Rolling walker (2 wheeled);Left platform walker Transfers: Sit to/from Stand Sit to Stand: Max assist         General transfer comment: Pt is able to show good effort going sit to stand, but continues to lean very, very heavily to the L needing excessive assist to remain upright   Ambulation/Gait Ambulation/Gait assistance:  (unable)               Stairs            Wheelchair Mobility    Modified Rankin (Stroke Patients Only)       Balance                                    Cognition Arousal/Alertness: Awake/alert Behavior During Therapy: Flat affect (appears more alert and able to interact today) Overall Cognitive Status: Impaired/Different from baseline                      Exercises General Exercises - Lower Extremity Ankle Circles/Pumps: AAROM;15 reps Quad Sets: AROM;10  reps;AAROM Short Arc Quad: AROM;10 reps;AAROM Heel Slides: AROM;10 reps;AAROM Hip ABduction/ADduction: AROM;AAROM;10 reps    General Comments        Pertinent Vitals/Pain Pain Assessment: No/denies pain    Home Living                      Prior Function            PT Goals (current goals can now be found in the care plan section) Progress towards PT goals: Progressing toward goals    Frequency  7X/week    PT Plan Current plan remains appropriate    Co-evaluation             End of Session Equipment Utilized During Treatment: Gait belt Activity Tolerance: Patient limited by fatigue;Patient limited by lethargy       Time: 1520-1545 PT Time Calculation (min) (ACUTE ONLY): 25 min  Charges:  $Therapeutic Exercise: 8-22 mins $Therapeutic Activity: 8-22 mins                    G Codes:     Wayne Both, PT, DPT [phone]  Kreg Shropshire 10/31/2014, 4:34 PM

## 2014-10-31 NOTE — Progress Notes (Signed)
Patient ID: [name], male   DOB: 11/05/1955, 59 y.o.   MRN: [id number] [name] PROGRESS NOTE  PCP: Marden Noble, MD  HPI/Subjective: Patient feeling okay. His right leg hanging off the bed. As per the wife, the patient was almost trying to walk. Right now his gown was wet from urine. The urinal was over on the counter. The patient states that he didn't feel that he needed to urinate. Unable to move left arm today. I'm barely able to move the left leg.  Objective: Filed Vitals:   10/31/14 0900  BP: 112/82  Pulse: 73  Temp: 98.8 F (37.1 C)  Resp: 18    Intake/Output Summary (Last 24 hours) at 10/31/14 1157 Last data filed at 10/31/14 0830  Gross per 24 hour  Intake      0 ml  Output      0 ml  Net      0 ml   Filed Weights   10/29/14 0428 10/30/14 0529 10/31/14 0521  Weight: 89.54 kg (197 lb 6.4 oz) 88.587 kg (195 lb 4.8 oz) 87.907 kg (193 lb 12.8 oz)    ROS: Review of Systems  Constitutional: Negative for fever and chills.  Eyes: Negative for blurred vision.  Respiratory: Negative for cough and shortness of breath.   Cardiovascular: Negative for chest pain.  Gastrointestinal: Negative for nausea, vomiting, abdominal pain, diarrhea and constipation.  Genitourinary: Negative for dysuria.  Musculoskeletal: Negative for joint pain.  Neurological: Positive for focal weakness and weakness. Negative for dizziness and headaches.   Exam: Physical Exam  HENT:  Nose: No mucosal edema.  Mouth/Throat: No oropharyngeal exudate or posterior oropharyngeal edema.  Eyes: Conjunctivae, EOM and lids are normal. Pupils are equal, round, and reactive to light.  Neck: No JVD present. Carotid bruit is not present. No edema present. No thyroid mass and no thyromegaly present.  Cardiovascular: S1 normal and S2 normal.  Exam reveals no gallop.   No murmur heard. Pulses:      Dorsalis pedis pulses are 2+ on the right side, and 2+ on the left side.  Respiratory: No  respiratory distress. He has no wheezes. He has no rhonchi. He has no rales.  GI: Soft. Bowel sounds are normal. There is no tenderness.  Musculoskeletal: Normal range of motion.       Right ankle: He exhibits no swelling.       Left ankle: He exhibits no swelling.  Lymphadenopathy:    He has no cervical adenopathy.  Neurological: He is alert. No cranial nerve deficit.  Unable to move left arm for me today at all. Left leg weakness 3+ out of 5 strength, patient able to get the left heel slightly off the bed.  Skin: Skin is warm. No rash noted. Nails show no clubbing.  Psychiatric: His affect is blunt.    Scheduled Meds: . antiseptic oral rinse  7 mL Mouth Rinse q12n4p  . aspirin  325 mg Oral Daily  . chlorhexidine  15 mL Mouth Rinse BID  . enoxaparin (LOVENOX) injection  40 mg Subcutaneous Q24H  . gemfibrozil  600 mg Oral BID AC  . rosuvastatin  10 mg Oral q1800    Assessment/Plan: 1. Acute stroke with left arm paralysis and left leg weakness and dysphasia. Patient also has some edema and petechial hemorrhage. - Appreciate neurology evaluation. Patient's family had refused TPA on admission. Continue aspirin and Crestor. - Appreciate physical therapy, occupational therapy, speech pathology consultations.  - Patient will  be a candidate for acute rehab. Hopefully will hear about acceptances to acute rehabilitation on Monday. - Echo could not rule out PFO or ASD- will get TEE Monday morning. - Patient needs close medical supervision and a coordinated approach to therapies. 2. Hypertriglyceridemia- the patient is on gemfibrozil. 3. Essential hypertension- blood pressure is on the lower range off his usual medication. Continue to watch off blood pressure medication at this time.  Code Status:     Code Status Orders        Start     Ordered   10/27/14 1202  Full code   Continuous     10/27/14 1201     Family Communication: Wife at the bedside Disposition Plan: Will need Acute  rehabilitation, potentially Monday afternoon.  Consultants:  Dr. Irish Elders neurology, PT, OT, speech pathology  Time spent: 22 minutes  Loletha Grayer  [facility]

## 2014-11-01 ENCOUNTER — Inpatient Hospital Stay: Payer: No Typology Code available for payment source

## 2014-11-01 ENCOUNTER — Inpatient Hospital Stay (HOSPITAL_COMMUNITY)
Admit: 2014-11-01 | Discharge: 2014-11-01 | Disposition: A | Discharge status: Home / Self Care | Payer: No Typology Code available for payment source | Attending: Cardiovascular Disease | Admitting: Cardiovascular Disease

## 2014-11-01 ENCOUNTER — Encounter
Admission: EM | Disposition: A | Discharge status: Other Acute Inpatient Hospital | Payer: Self-pay | Source: Home / Self Care | Attending: Internal Medicine

## 2014-11-01 DIAGNOSIS — I639 Cerebral infarction, unspecified: Secondary | ICD-10-CM

## 2014-11-01 HISTORY — PX: TEE WITHOUT CARDIOVERSION: SHX5443

## 2014-11-01 SURGERY — ECHOCARDIOGRAM, TRANSESOPHAGEAL
Anesthesia: Moderate Sedation

## 2014-11-01 MED ORDER — SODIUM CHLORIDE 0.9 % IV SOLN: INTRAVENOUS | Status: DC

## 2014-11-01 MED ORDER — BUTAMBEN-TETRACAINE-BENZOCAINE 2-2-14 % EX AERO
INHALATION_SPRAY | CUTANEOUS | Status: AC
  Filled 2014-11-01: qty 20

## 2014-11-01 MED ORDER — FENTANYL CITRATE (PF) 100 MCG/2ML IJ SOLN
INTRAMUSCULAR | Status: AC | PRN
  Administered 2014-11-01: 50 ug via INTRAVENOUS

## 2014-11-01 MED ORDER — SODIUM CHLORIDE 0.9 % IV SOLN
INTRAVENOUS | Status: AC | PRN
  Administered 2014-11-01: 100 mL/h via INTRAVENOUS

## 2014-11-01 MED ORDER — FENTANYL CITRATE (PF) 100 MCG/2ML IJ SOLN
INTRAMUSCULAR | Status: AC
  Filled 2014-11-01: qty 4

## 2014-11-01 MED ORDER — MIDAZOLAM HCL 5 MG/5ML IJ SOLN
INTRAMUSCULAR | Status: AC
  Filled 2014-11-01: qty 10

## 2014-11-01 MED ORDER — MIDAZOLAM HCL 2 MG/2ML IJ SOLN
INTRAMUSCULAR | Status: AC | PRN
  Administered 2014-11-01: 2 mg via INTRAVENOUS

## 2014-11-01 NOTE — CV Procedure (Signed)
Transesophageal echocardiogram note: A standard informed consent was obtained. A timeout was performed. Lidocaine spray was used. The patient was given 2 mg of Versed and 50 g of fentanyl intravenously. The TEE probe passed without difficulty. Standard views were obtained. Agitated saline was given. The patient tolerated the procedure with no immediate complications.  Findings: Normal LV systolic function with minor valvular abnormalities. There is a small PFO with small left-to-right shunt by bubble study. No evidence of intracardiac thrombus.  Recommendations: Continue medical management and daily aspirin. Unclear significance of a small left-to-right shunt. Closure of this should only be recommended in the setting of a clinical trial.

## 2014-11-01 NOTE — Care Management (Signed)
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  [facility] re[facility] is in network.  Discussed with patient's wife and  [facility] has received the referral.  There are available beds

## 2014-11-01 NOTE — Progress Notes (Signed)
Patient ID: YIFAN AUKER, male   DOB: 1956/03/02, 59 y.o.   MRN: [id number] [facility] PROGRESS NOTE  PCP: Marden Noble, MD  HPI/Subjective: Pt. Lying in bed w/ wife at bedside.  Still has dense left sided hemi-paresis.  No other complaints. Going for MBS later today.  Had TEE done today which showed a small PFO.   Objective: Filed Vitals:   11/01/14 1130  BP: 114/74  Pulse: 72  Temp: 98.2 F (36.8 C)  Resp: 18    Intake/Output Summary (Last 24 hours) at 11/01/14 1250 Last data filed at 11/01/14 0900  Gross per 24 hour  Intake      0 ml  Output      0 ml  Net      0 ml   Filed Weights   10/30/14 0529 10/31/14 0521 11/01/14 0446  Weight: 88.587 kg (195 lb 4.8 oz) 87.907 kg (193 lb 12.8 oz) 86.6 kg (190 lb 14.7 oz)    ROS: Review of Systems  Constitutional: Negative for fever and chills.  HENT: Negative for congestion and tinnitus.   Eyes: Negative for blurred vision and double vision.  Respiratory: Negative for cough, shortness of breath and wheezing.   Cardiovascular: Negative for chest pain, orthopnea and PND.  Gastrointestinal: Negative for nausea, vomiting, abdominal pain and diarrhea.  Genitourinary: Negative for dysuria and hematuria.  Skin: Negative for rash.  Neurological: Positive for focal weakness (left sided hemiparesis) and weakness (dense left sided weakness). Negative for dizziness, sensory change, seizures and headaches.  All other systems reviewed and are negative.  Exam: Physical Exam  Constitutional: He appears well-developed and well-nourished.  HENT:  Nose: No mucosal edema.  Mouth/Throat: No oropharyngeal exudate or posterior oropharyngeal edema.  Eyes: Conjunctivae, EOM and lids are normal. Pupils are equal, round, and reactive to light.  Neck: No JVD present. Carotid bruit is not present. No edema present. No thyroid mass and no thyromegaly present.  Cardiovascular: S1 normal and S2 normal.  Exam reveals no gallop.   No  murmur heard. Pulses:      Dorsalis pedis pulses are 2+ on the right side, and 2+ on the left side.  Respiratory: No respiratory distress. He has no wheezes. He has no rhonchi. He has no rales.  GI: Soft. Bowel sounds are normal. There is no tenderness.  Musculoskeletal: Normal range of motion.       Right ankle: He exhibits no swelling.       Left ankle: He exhibits no swelling.  Lymphadenopathy:    He has no cervical adenopathy.  Neurological: He is alert. No cranial nerve deficit.  Unable to move left arm. Left leg weakness 2+ out of 5 strength, patient able to get the left heel slightly off the bed.  Skin: Skin is warm. No rash noted. Nails show no clubbing.  Psychiatric: His affect is blunt.    Scheduled Meds: . antiseptic oral rinse  7 mL Mouth Rinse q12n4p  . aspirin  325 mg Oral Daily  . butamben-tetracaine-benzocaine      . chlorhexidine  15 mL Mouth Rinse BID  . enoxaparin (LOVENOX) injection  40 mg Subcutaneous Q24H  . fentaNYL      . gemfibrozil  600 mg Oral BID AC  . midazolam      . rosuvastatin  10 mg Oral q1800    Assessment/Plan: 1. Acute stroke with left arm paralysis and left leg weakness and dysphagia.  - Appreciate neurology evaluation. Patient's family had  refused TPA on admission. Continue aspirin and Crestor. - Appreciate physical therapy, occupational therapy eval and likely to acute inpatient rehab tomorrow.  - seen by speech and going to have MBS today.  - TTE (-) for ASD and therefore had TEE today showing small PFO as per [place].  - discussed w/ Dr. Irish Elders and due to PFO will need to get doppler of LE to r/o DVT and it has been ordered.   2. Hypertriglyceridemia- the patient is on gemfibrozil, Crestor.   3. Essential hypertension- blood pressure is on the lower range off his usual medication. Continue to watch off blood pressure medication at this time.  Likely d/c to acute inpatient rehab tomorrow.    Code Status:     Code Status Orders         Start     Ordered   10/27/14 1202  Full code   Continuous     10/27/14 1201     Family Communication: Wife at the bedside Disposition Plan: Will need Acute inpatient rehab.  Likely d/c there tomorrow and likely at [facility].    Consultants:  Dr. Irish Elders neurology, PT, OT, speech pathology, Case Management.   Time spent: 30 minutes  Henreitta Leber  [facility] Hospital[facility]

## 2014-11-01 NOTE — Progress Notes (Signed)
[facility] inpatient rehab admissions - I received a call from insurance case manager.  [place] is not a tier one network for [employer], however, [place] area is tier one.  Insurance case manager says patient will not be admitting to our [facility] facility.  If you need to speak with the insurance case manager, Quincy Sheehan, her phone # is [phone].  Call me for questions at [phone]

## 2014-11-01 NOTE — Progress Notes (Signed)
   SUBJECTIVE: He is alert with slurred speech.    Filed Vitals:   10/31/14 0900 10/31/14 1231 10/31/14 1934 11/01/14 0446  BP: 112/82 120/85 132/92 120/69  Pulse: 73 94 81 75  Temp: 98.8 F (37.1 C) 99 F (37.2 C) 99.5 F (37.5 C) 98.5 F (36.9 C)  TempSrc: Oral Oral Oral Oral  Resp: 18 18 20 22   Height:      Weight:    86.6 kg (190 lb 14.7 oz)  SpO2: 98% 99% 98% 97%    Intake/Output Summary (Last 24 hours) at 11/01/14 0807 Last data filed at 10/31/14 1700  Gross per 24 hour  Intake      0 ml  Output      0 ml  Net      0 ml    LABS: Basic Metabolic Panel: No results for input(s): NA, K, CL, CO2, GLUCOSE, BUN, CREATININE, CALCIUM, MG, PHOS in the last 72 hours. Liver Function Tests: No results for input(s): AST, ALT, ALKPHOS, BILITOT, PROT, ALBUMIN in the last 72 hours. No results for input(s): LIPASE, AMYLASE in the last 72 hours. CBC: No results for input(s): WBC, NEUTROABS, HGB, HCT, MCV, PLT in the last 72 hours. Cardiac Enzymes: No results for input(s): CKTOTAL, CKMB, CKMBINDEX, TROPONINI in the last 72 hours. BNP: Invalid input(s): POCBNP D-Dimer: No results for input(s): DDIMER in the last 72 hours. Hemoglobin A1C: No results for input(s): HGBA1C in the last 72 hours. Fasting Lipid Panel: No results for input(s): CHOL, HDL, LDLCALC, TRIG, CHOLHDL, LDLDIRECT in the last 72 hours. Thyroid Function Tests: No results for input(s): TSH, T4TOTAL, T3FREE, THYROIDAB in the last 72 hours.  Invalid input(s): FREET3 Anemia Panel: No results for input(s): VITAMINB12, FOLATE, FERRITIN, TIBC, IRON, RETICCTPCT in the last 72 hours.   PHYSICAL EXAM General: Well developed, well nourished, in no acute distress HEENT:  Normocephalic and atramatic Neck:  No JVD.  Lungs: Clear bilaterally to auscultation and percussion. Heart: HRRR . Normal S1 and S2 without gallops or murmurs.  Abdomen: Bowel sounds are positive, abdomen soft and non-tender  Msk:  Back normal, normal  gait. Normal strength and tone for age. Extremities: No clubbing, cyanosis or edema.   Neuro: Alert and oriented X 3. Slurred speech Psych:  Good affect, responds appropriately  TELEMETRY: Reviewed telemetry pt in NSR.   ASSESSMENT AND PLAN:  1. Ischemic stroke: -Per patient's family he stopped taking all of his medications prior to the events that led to his admission  -Presenting BP 134/99  -TTE did not reveal obvious source of cardiac embolic, study inadequate to rule out shunt  -TEE is planned today.  -No signs of a-fib on telemetry, may need 30 day event monitor outpatient cardiac monitoring if no arrhythmia is seen while inpatient  -Continue aspirin  -Lipitor, goal LDL <70 -PT/OT/speech pathology   2. HTN: -Apparently he stopped all of his medication prior to admission -BP has been stable while not on any antihypertensives -Continue to monitor  3. Dispo: -Will need rehab  Kathlyn Sacramento, MD, [facility] 11/01/2014 8:07 AM

## 2014-11-01 NOTE — Consult Note (Signed)
Chief Complaint: R sided weakness   No new complaints. Pt is awaiting transfer to rehab facility.  Speech improved. LLE has improved.     Past Medical History  Diagnosis Date  . Hypertension   . Pancreatitis   . Diabetes mellitus without complication     History reviewed. No pertinent past surgical history.  Family History  Problem Relation Age of Onset  . CAD Sister   . Hypertension Sister    Social History:  reports that he has been smoking.  He does not have any smokeless tobacco history on file. He reports that he drinks alcohol. He reports that he does not use illicit drugs.  Allergies: No Known Allergies  Medications: I have reviewed the patient's current medications.  ROS: Unable to obtain   Physical Examination: Blood pressure 114/74, pulse 72, temperature 98.2 F (36.8 C), temperature source Oral, resp. rate 18, height 5\' 6"  (1.676 m), weight 86.6 kg (190 lb 14.7 oz), SpO2 97 %.  Speech still dysarthric but much improved.  L sided hemiparesis persists with leg stronger then arm.  Able to follow commands Was able to pull himself out of bed.   Laboratory Studies:  Basic Metabolic Panel:  Recent Labs Lab 10/27/14 0525  NA 140  K 3.6  CL 109  CO2 24  GLUCOSE 136*  BUN 8  CREATININE 1.26*  CALCIUM 8.8*    Liver Function Tests:  Recent Labs Lab 10/27/14 0525  AST 30  ALT 9*  ALKPHOS 54  BILITOT 1.6*  PROT 7.1  ALBUMIN 3.8   No results for input(s): LIPASE, AMYLASE in the last 168 hours. No results for input(s): AMMONIA in the last 168 hours.  CBC:  Recent Labs Lab 10/27/14 0525  WBC 4.0  HGB 13.8  HCT 40.5  MCV 81.8  PLT 175    Cardiac Enzymes:  Recent Labs Lab 10/27/14 0525  TROPONINI <0.03    BNP: Invalid input(s): POCBNP  CBG:  Recent Labs Lab 10/27/14 0720  GLUCAP 135*    Microbiology: No results found for this or any previous visit.  Coagulation Studies: No results for input(s): LABPROT, INR in the  last 72 hours.  Urinalysis: No results for input(s): COLORURINE, LABSPEC, PHURINE, GLUCOSEU, HGBUR, BILIRUBINUR, KETONESUR, PROTEINUR, UROBILINOGEN, NITRITE, LEUKOCYTESUR in the last 168 hours.  Invalid input(s): APPERANCEUR  Lipid Panel:    Component Value Date/Time   CHOL 186 10/28/2014 0355   TRIG 307* 10/28/2014 0355   HDL 37* 10/28/2014 0355   CHOLHDL 5.0 10/28/2014 0355   VLDL 61* 10/28/2014 0355   LDLCALC 88 10/28/2014 0355    HgbA1C:  Lab Results  Component Value Date   HGBA1C 5.6 10/28/2014    Urine Drug Screen:  No results found for: LABOPIA, COCAINSCRNUR, LABBENZ, AMPHETMU, THCU, LABBARB  Alcohol Level: No results for input(s): ETH in the last 168 hours.  Other results: EKG: normal EKG, normal sinus rhythm, unchanged from previous tracings.  Imaging: No results found.  Assessment: 59 y.o. male with R M1 occlusion Last known well 11AM, refused TPA, not thrombectomy candidate. Plegic on L side.    Stroke Risk Factors - family history, hyperlipidemia and hypertension  Plan: Stroke work up complete on ASA S/p TEE pt does have small PFO [place] LE ordered, if negative does not need anticoagulation  Pt's wife is asking for paperwork that pt needs for disability. Will ask social work/case management  for help.     [name]   11/01/2014, 11:51 AM

## 2014-11-01 NOTE — Evaluation (Signed)
Objective Swallowing Evaluation:  (MBSS)  Patient Details  Name: Daniel [name] MRN: [id number] Date of Birth: [birth year]  Today's Date: 11/01/2014 Time: SLP Start Time (ACUTE ONLY): 1345-SLP Stop Time (ACUTE ONLY): 1445 SLP Time Calculation (min) (ACUTE ONLY): 60 min  Past Medical History:  Past Medical History  Diagnosis Date  . Hypertension   . Pancreatitis   . Diabetes mellitus without complication    Past Surgical History: History reviewed. No pertinent past surgical history. HPI:  Other Pertinent Information: pt presents w/ moderate-severe Left sided weakness, neglect, and Cognitive deficits post R CVA last week. He is able to follow basic instructions given moderate+ verbal cues.   No Data Recorded  Assessment / Plan / Recommendation CHL IP CLINICAL IMPRESSIONS 11/01/2014  Therapy Diagnosis Moderate oral phase dysphagia;Moderate pharyngeal phase dysphagia  Clinical Impression Pt appears to present w/ moderate delay in pharyngeal swallow initiation and moderate+ oral phase deficits as a result of the R CVA. Pt is at increased risk for aspiration during the sweallow sec. to this, moreso w/ thin liquids, but he is also at risk for aspiration post swallow d/t the oropharyngeal residue noted b/t trials of solids/liquids. When pt was given Nectar consistency liquids in monitored bolus size and speed as well as instructed to use multiple swallows to aid reducing oropharyngeal residue post swallow, he was able to consume po trials w/out immediate aspiration during and immediately following the boluses. He is at increased risk for aspiration sec. to the severity of his deficits as well as his declined self awareness d/t the Cognitive deficits post the CVA. Pt would greatly benefit from a modified diet and f/u skilled ST services for Dysphagia tx.      CHL IP TREATMENT RECOMMENDATION 11/01/2014  Treatment Recommendations Therapy as outlined in treatment plan below     CHL IP DIET RECOMMENDATION  11/01/2014  SLP Diet Recommendations Dysphagia 1 (Puree);Nectar  Liquid Administration via (None)  Medication Administration Crushed with puree  Compensations Small sips/bites;Check for pocketing;Check for anterior loss;Multiple dry swallows after each bite/sip  Postural Changes and/or Swallow Maneuvers (None)     CHL IP OTHER RECOMMENDATIONS 11/01/2014  Recommended Consults (No Data)  Oral Care Recommendations Oral care BID;Oral care before and after PO;Staff/trained caregiver to provide oral care  Other Recommendations Order thickener from pharmacy;Remove water pitcher;Prohibited food (jello, ice cream, thin soups);Clarify dietary restrictions     CHL IP FOLLOW UP RECOMMENDATIONS 10/29/2014  Follow up Recommendations Inpatient Rehab;Skilled Nursing facility     [facility] IP FREQUENCY AND DURATION 11/01/2014  Speech Therapy Frequency (ACUTE ONLY) min 3x week  Treatment Duration 1 week     Pertinent Vitals/Pain denied    SLP Swallow Goals See care pla      CHL IP REASON FOR REFERRAL 11/01/2014  Reason for Referral Objectively evaluate swallowing function     CHL IP ORAL PHASE 11/01/2014  Lips (None)  Tongue (None)  Mucous membranes (None)  Nutritional status (None)  Other (None)  Oxygen therapy (None)  Oral Phase Impaired  Oral - Pudding Teaspoon (None)  Oral - Pudding Cup (None)  Oral - Honey Teaspoon (None)  Oral - Honey Cup (None)  Oral - Honey Syringe (None)  Oral - Nectar Teaspoon (None)  Oral - Nectar Cup (None)  Oral - Nectar Straw (None)  Oral - Nectar Syringe (None)  Oral - Ice Chips (None)  Oral - Thin Teaspoon (None)  Oral - Thin Cup (None)  Oral - Thin Straw (None)  Oral - Thin Syringe (  None)  Oral - Puree (None)  Oral - Mechanical Soft (None)  Oral - Regular (None)  Oral - Multi-consistency (None)  Oral - Pill (None)  Oral Phase - Comment (None)      CHL IP PHARYNGEAL PHASE 11/01/2014  Pharyngeal Phase Impaired  Pharyngeal - Pudding Teaspoon (None)   Penetration/Aspiration details (pudding teaspoon) (None)  Pharyngeal - Pudding Cup (None)  Penetration/Aspiration details (pudding cup) (None)  Pharyngeal - Honey Teaspoon (None)  Penetration/Aspiration details (honey teaspoon) (None)  Pharyngeal - Honey Cup (None)  Penetration/Aspiration details (honey cup) (None)  Pharyngeal - Honey Syringe (None)  Penetration/Aspiration details (honey syringe) (None)  Pharyngeal - Nectar Teaspoon (None)  Penetration/Aspiration details (nectar teaspoon) (None)  Pharyngeal - Nectar Cup (None)  Penetration/Aspiration details (nectar cup) (None)  Pharyngeal - Nectar Straw (None)  Penetration/Aspiration details (nectar straw) (None)  Pharyngeal - Nectar Syringe (None)  Penetration/Aspiration details (nectar syringe) (None)  Pharyngeal - Ice Chips (None)  Penetration/Aspiration details (ice chips) (None)  Pharyngeal - Thin Teaspoon (None)  Penetration/Aspiration details (thin teaspoon) (None)  Pharyngeal - Thin Cup (None)  Penetration/Aspiration details (thin cup) (None)  Pharyngeal - Thin Straw (None)  Penetration/Aspiration details (thin straw) (None)  Pharyngeal - Thin Syringe (None)  Penetration/Aspiration details (thin syringe') (None)  Pharyngeal - Puree (None)  Penetration/Aspiration details (puree) (None)  Pharyngeal - Mechanical Soft (None)  Penetration/Aspiration details (mechanical soft) (None)  Pharyngeal - Regular (None)  Penetration/Aspiration details (regular) (None)  Pharyngeal - Multi-consistency (None)  Penetration/Aspiration details (multi-consistency) (None)  Pharyngeal - Pill (None)  Penetration/Aspiration details (pill) (None)  Pharyngeal Comment delayed pharyngeal swallow initiation was noted w/ all consistencies; esp. liquids.       CHL IP CERVICAL ESOPHAGEAL PHASE 11/01/2014  Cervical Esophageal Phase WFL  Pudding Teaspoon (None)  Pudding Cup (None)  Honey Teaspoon (None)  Honey Cup (None)  Honey Straw (None)   Nectar Teaspoon (None)  Nectar Cup (None)  Nectar Straw (None)  Nectar Sippy Cup (None)  Thin Teaspoon (None)  Thin Cup (None)  Thin Straw (None)  Thin Sippy Cup (None)  Cervical Esophageal Comment (None)    No flowsheet data found.         Watson,Katherine 11/01/2014, 3:12 PM

## 2014-11-01 NOTE — Care Management (Addendum)
Have received auth for inpatient stay at [facility].  Informed attending.  Discussed patient is going have swallowing study today, US doppler of LE.  Also discussed possible MetB.  Patient will transport via  Ambulance- possibly [facility].  Discussed that patient can not transfer without documentation that patient has had a BM. Edyth Gunnels Number- [id number]

## 2014-11-01 NOTE — Progress Notes (Signed)
PT Cancellation Note  Patient Details Name: Daniel [name] MRN: [id number] DOB: March 11, 1956   Cancelled Treatment:    Reason Eval/Treat Not Completed: Patient at procedure or test/unavailable   Charlaine Dalton 11/01/2014, 2:43 PM

## 2014-11-01 NOTE — OR Nursing (Signed)
Report call to [facility] on 2-A, advised to verify return of gag reflex prior to giving PO fluids as patient throat was medicated with cetacain spray prior to TEE

## 2014-11-02 ENCOUNTER — Ambulatory Visit (HOSPITAL_COMMUNITY)
Admission: AD | Admit: 2014-11-02 | Discharge: 2014-11-02 | Disposition: A | Discharge status: Home / Self Care | Payer: No Typology Code available for payment source | Source: Other Acute Inpatient Hospital | Attending: Specialist | Admitting: Specialist

## 2014-11-02 DIAGNOSIS — I639 Cerebral infarction, unspecified: Secondary | ICD-10-CM | POA: Insufficient documentation

## 2014-11-02 MED ORDER — ROSUVASTATIN CALCIUM 10 MG PO TABS: 10.0000 mg | ORAL_TABLET | Freq: Every day | ORAL | Status: DC

## 2014-11-02 MED ORDER — ASPIRIN 325 MG PO TABS: 325.0000 mg | ORAL_TABLET | Freq: Every day | ORAL | Status: DC

## 2014-11-02 MED ORDER — NYSTATIN 100000 UNIT/ML MT SUSP: 5.0000 mL | Freq: Four times a day (QID) | OROMUCOSAL | Status: DC

## 2014-11-02 MED ORDER — NYSTATIN 100000 UNIT/ML MT SUSP
5.0000 mL | Freq: Four times a day (QID) | OROMUCOSAL | Status: DC
  Filled 2014-11-02 (×4): qty 5

## 2014-11-02 MED ORDER — SENNOSIDES-DOCUSATE SODIUM 8.6-50 MG PO TABS: 1.0000 | ORAL_TABLET | Freq: Every evening | ORAL | Status: DC | PRN

## 2014-11-02 NOTE — Plan of Care (Signed)
Problem: Progression Outcomes Goal: Other Progression Outcomes Patient has no acute event overnight. He was NSr on the monitor with VS WDL for patient. Wife was at the bedside. Patient and wife educated about care plan and encouraged to ask questions. Patient remained incontinent with neuro deficit on the left side. He was able to slightly move hi left leg but have no movement on the left arm. Patient was repositioned Q2 and as needed.

## 2014-11-02 NOTE — Discharge Summary (Addendum)
[facility] [place] NAME: Daniel [name]    MR#:  [id number]  DATE OF BIRTH:  [birth year]  DATE OF ADMISSION:  10/27/2014 ADMITTING PHYSICIAN: Max Sane, MD  DATE OF DISCHARGE: 11/02/2014  PRIMARY CARE PHYSICIAN: Marden Noble, MD    ADMISSION DIAGNOSIS:  Right sided weakness [M62.89] Acute CVA (cerebrovascular accident) [I63.9]  DISCHARGE DIAGNOSIS:  Active Problems:   Stroke   SECONDARY DIAGNOSIS:   Past Medical History  Diagnosis Date  . Hypertension   . Pancreatitis   . Diabetes mellitus without complication     HOSPITAL COURSE:   59 year old male with past medical history of hypertension, pancreatitis, type 2 diabetes without complication, who presented to the hospital due to altered mental status and left-sided weakness.  #1 acute CVA - this was likely the cause of patient's altered mental status and left-sided weakness. - Patient had refused TPA on admission. - Patient had MRI of the brain which showed an acute right MCA M1 occlusion with a moderate to large acute right MCA infarct. Patient has small amount of petechial hemorrhage. No midline shift and no edema. -Patient had a MRA which was negative for any intracranial stenosis. Patient also had a CT angiography of the head and neck which did show a right M1 occlusion but no evidence of carotid or vertebral artery stenosis. Patient also had a transthoracic echocardiogram which showed no evidence of PFO or ASD, patient also underwent a transesophageal echocardiogram which did show a small PFO. Neurology then recommended getting a Doppler of the lower extremities to rule out deep venous thrombosis which was negative. -Patient was maintained on aspirin and statin. Patient was seen by neurology who recommended TPA initially on the patient and the patient's family refused and he was managed medically as mentioned above. -Patient was seen by physical therapy and occupational  therapy and thought he would benefit from acute inpatient rehabilitation which is where he is presently being discharged. - Patient was also seen by speech therapy and started on a dysphagia 1 diet with nectar thick liquids and strict aspiration precautions.  #2 hypertension-patient's blood pressure has been fairly well controlled during the hospital course. Patient will continue his amlodipine.  #3 oral thrush-patient has been started on some nystatin swish and swallow.  #4 hyperlipidemia-patient has been maintained on Crestor and will continue that.  DISCHARGE CONDITIONS:   Stable  CONSULTS OBTAINED:  Treatment Team:  Leotis Pain, MD Wellington Hampshire, MD  DRUG ALLERGIES:  No Known Allergies  DISCHARGE MEDICATIONS:   Current Discharge Medication List    START taking these medications   Details  aspirin 325 MG tablet Take 1 tablet (325 mg total) by mouth daily. Qty: 30 tablet, Refills: 1    nystatin (MYCOSTATIN) 100000 UNIT/ML suspension Take 5 mLs (500,000 Units total) by mouth 4 (four) times daily. Qty: 60 mL, Refills: 0    rosuvastatin (CRESTOR) 10 MG tablet Take 1 tablet (10 mg total) by mouth daily at 6 PM. Qty: 60 tablet, Refills: 1    senna-docusate (SENOKOT-S) 8.6-50 MG per tablet Take 1 tablet by mouth at bedtime as needed for mild constipation. Qty: 30 tablet, Refills: 1      CONTINUE these medications which have NOT CHANGED   Details  amLODipine (NORVASC) 10 MG tablet Take 10 mg by mouth daily.    gemfibrozil (LOPID) 600 MG tablet Take 600 mg by mouth 2 (two) times daily before a meal.    meloxicam (  MOBIC) 15 MG tablet Take 15 mg by mouth daily.         DISCHARGE INSTRUCTIONS:   DIET:  Cardiac diet and dysphagia 1 with nectar thick liquids and strict aspiration precautions. Meds in puree.  Check for pocketing; check for anterior loss, multiple dry swallows after each bite/sip.   DISCHARGE CONDITION:  Stable  ACTIVITY:  Activity as  tolerated  OXYGEN:  Home Oxygen: No.   Oxygen Delivery: room air  DISCHARGE LOCATION:  Acute inpatient rehabilitation at [facility]   If you experience worsening of your admission symptoms, develop shortness of breath, life threatening emergency, suicidal or homicidal thoughts you must seek medical attention immediately by calling 911 or calling your MD immediately  if symptoms less severe.  You Must read complete instructions/literature along with all the possible adverse reactions/side effects for all the Medicines you take and that have been prescribed to you. Take any new Medicines after you have completely understood and accpet all the possible adverse reactions/side effects.   Please note  You were cared for by a hospitalist during your hospital stay. If you have any questions about your discharge medications or the care you received while you were in the hospital after you are discharged, you can call the unit and asked to speak with the hospitalist on call if the hospitalist that took care of you is not available. Once you are discharged, your primary care physician will handle any further medical issues. Please note that NO REFILLS for any discharge medications will be authorized once you are discharged, as it is imperative that you return to your primary care physician (or establish a relationship with a primary care physician if you do not have one) for your aftercare needs so that they can reassess your need for medications and monitor your lab values.     Today    VITAL SIGNS:  Blood pressure 120/91, pulse 73, temperature 98.2 F (36.8 C), temperature source Oral, resp. rate 18, height 5\' 6"  (1.676 m), weight 88.95 kg (196 lb 1.6 oz), SpO2 100 %.  I/O:   Intake/Output Summary (Last 24 hours) at 11/02/14 0852 Last data filed at 11/01/14 1828  Gross per 24 hour  Intake      0 ml  Output      0 ml  Net      0 ml    PHYSICAL EXAMINATION:  GENERAL:  59 y.o.-year-old patient  lying in the bed with no acute distress.  EYES: Pupils equal, round, reactive to light and accommodation. No scleral icterus. Extraocular muscles intact.  HEENT: Head atraumatic, normocephalic. + Oral Thrush.   NECK:  Supple, no jugular venous distention. No thyroid enlargement, no tenderness.  LUNGS: Normal breath sounds bilaterally, no wheezing, rales,rhonchi or crepitation. No use of accessory muscles of respiration.  CARDIOVASCULAR: S1, S2 normal. No murmurs, rubs, or gallops.  ABDOMEN: Soft, non-tender, non-distended. Bowel sounds present. No organomegaly or mass.  EXTREMITIES: No pedal edema, cyanosis, or clubbing.  NEUROLOGIC: Cranial nerves II through XII are intact. Left sided weakness. LUE w/ 1/5 strength, LLE with 2/5 strength.  Hyperreflexia on left compared to right.   PSYCHIATRIC: The patient is alert and oriented x 3.  Flat affect.   SKIN: No obvious rash, lesion, or ulcer.   DATA REVIEW:   CBC  Recent Labs Lab 10/27/14 0525  WBC 4.0  HGB 13.8  HCT 40.5  PLT 175    Chemistries   Recent Labs Lab 10/27/14 0525  NA 140  K 3.6  CL 109  CO2 24  GLUCOSE 136*  BUN 8  CREATININE 1.26*  CALCIUM 8.8*  AST 30  ALT 9*  ALKPHOS 54  BILITOT 1.6*    Cardiac Enzymes  Recent Labs Lab 10/27/14 0525  TROPONINI <0.03    Microbiology Results  No results found for this or any previous visit.  RADIOLOGY:  US Venous Img Lower Bilateral  11/01/2014   CLINICAL DATA:  Stroke and bilateral leg swelling for 2 days.  EXAM: BILATERAL LOWER EXTREMITY VENOUS DOPPLER ULTRASOUND  TECHNIQUE: Gray-scale sonography with graded compression, as well as color Doppler and duplex ultrasound were performed to evaluate the lower extremity deep venous systems from the level of the common femoral vein and including the common femoral, femoral, profunda femoral, popliteal and calf veins including the posterior tibial, peroneal and gastrocnemius veins when visible. The superficial great  saphenous vein was also interrogated. Spectral Doppler was utilized to evaluate flow at rest and with distal augmentation maneuvers in the common femoral, femoral and popliteal veins.  COMPARISON:  None.  FINDINGS: RIGHT LOWER EXTREMITY  Common Femoral Vein: No evidence of thrombus. Normal compressibility, respiratory phasicity and response to augmentation.  Saphenofemoral Junction: No evidence of thrombus. Normal compressibility and flow on color Doppler imaging.  Profunda Femoral Vein: No evidence of thrombus. Normal compressibility and flow on color Doppler imaging.  Femoral Vein: No evidence of thrombus. Normal compressibility, respiratory phasicity and response to augmentation.  Popliteal Vein: No evidence of thrombus. Normal compressibility, respiratory phasicity and response to augmentation.  Calf Veins: No evidence of thrombus. Normal compressibility and flow on color Doppler imaging.  LEFT LOWER EXTREMITY  Common Femoral Vein: No evidence of thrombus. Normal compressibility, respiratory phasicity and response to augmentation.  Saphenofemoral Junction: No evidence of thrombus. Normal compressibility and flow on color Doppler imaging.  Profunda Femoral Vein: No evidence of thrombus. Normal compressibility and flow on color Doppler imaging.  Femoral Vein: No evidence of thrombus. Normal compressibility, respiratory phasicity and response to augmentation.  Popliteal Vein: No evidence of thrombus. Normal compressibility, respiratory phasicity and response to augmentation.  Calf Veins: No evidence of thrombus. Normal compressibility and flow on color Doppler imaging.  Other Findings:  None.  IMPRESSION: No evidence of deep venous thrombosis.   Electronically Signed   By: Markus Daft M.D.   On: 11/01/2014 17:21    Management plans discussed with the patient, family and they are in agreement.  CODE STATUS:     Code Status Orders        Start     Ordered   10/27/14 1202  Full code   Continuous      10/27/14 1201      TOTAL TIME TAKING CARE OF THIS PATIENT: 45 minutes.    Henreitta Leber M.D on 11/02/2014 at 8:52 AM  Between 7am to 6pm - Pager - [phone]  After 6pm go to [email or web address] - password [email or web address] [facility]  Office  [phone]  CC: Primary care physician; Marden Noble, MD

## 2014-11-02 NOTE — Care Management (Signed)
Patient deemed medically stable for transfer to [facility] inpatient rehab.  [facility] notified.  Wife updated.  Documented evidence that patient has had bowel movement prior to discharge summary to facility.

## 2014-11-02 NOTE — Progress Notes (Signed)
Report called to Vickie at [facility].  Case management notified.  Tele removed, IV left in place due to [facility] transfer.   Lynnda Shields, RN

## 2015-02-04 ENCOUNTER — Encounter: Payer: Self-pay | Admitting: Cardiovascular Disease

## 2015-02-07 ENCOUNTER — Encounter: Payer: Self-pay | Admitting: Occupational Therapy

## 2015-02-07 ENCOUNTER — Ambulatory Visit: Payer: No Typology Code available for payment source | Attending: Internal Medicine | Admitting: Occupational Therapy

## 2015-02-07 DIAGNOSIS — R2681 Unsteadiness on feet: Secondary | ICD-10-CM | POA: Insufficient documentation

## 2015-02-07 DIAGNOSIS — R531 Weakness: Secondary | ICD-10-CM | POA: Insufficient documentation

## 2015-02-07 DIAGNOSIS — R41841 Cognitive communication deficit: Secondary | ICD-10-CM | POA: Insufficient documentation

## 2015-02-07 DIAGNOSIS — M6281 Muscle weakness (generalized): Secondary | ICD-10-CM | POA: Insufficient documentation

## 2015-02-07 NOTE — Patient Instructions (Signed)
Patient instructed in elevation of left hand as there is some swelling.

## 2015-02-07 NOTE — Therapy (Signed)
[place] [facility] SERVICES [address] [place], [place], [address] Phone: [phone]   Fax:  [phone]  Occupational Therapy Evaluation/Treatment  Patient Details  Name: Daniel [name] MRN: [id number] Date of Birth: [birth year] Referring Provider:  Marden Noble, MD  Encounter Date: 02/07/2015      OT End of Session - 02/07/15 1550    Visit Number 1   Number of Visits 24   Date for OT Re-Evaluation 05/02/15   Equipment Utilized During Treatment stroke test kit   Activity Tolerance Patient tolerated treatment well   Behavior During Therapy [facility] for tasks assessed/performed      Past Medical History  Diagnosis Date  . Hypertension   . Pancreatitis   . Diabetes mellitus without complication     Past Surgical History  Procedure Laterality Date  . Tee without cardioversion N/A 11/01/2014    Procedure: TRANSESOPHAGEAL ECHOCARDIOGRAM (TEE);  Surgeon: Wellington Hampshire, MD;  Location: [facility] ORS;  Service: Cardiovascular;  Laterality: N/A;    There were no vitals filed for this visit.  Visit Diagnosis:  Muscle weakness of left upper extremity      Subjective Assessment - 02/07/15 1544    Subjective  Wants to get more use of my left side.   Pertinent History 59 year old male who suffered a CVA on October 28, 2014.   Limitations L arm use.   Patient Stated Goals To get my left side working   Currently in Pain? No/denies    Mobilization of wrist and fingers and stretching into extension. Anti edema massage given. Instructed in elevation of hand to prevent further edema.                        OT Education - 02/07/15 1547    Education provided Yes   Education Details Educated in why elevation of right hand is important.   Person(s) Educated Patient   Methods Explanation   Comprehension Verbalized understanding             OT Long Term Goals - 02/07/15 1712    OT LONG TERM GOAL #1   Title Patient will  increase L UE function or compensate to be able to open jars and bottles.   Baseline Unable to open jars and bottles   Time 12   Period Weeks   Status New   OT LONG TERM GOAL #2   Title Will be able to cut his his lawn.   Baseline Unable to cut his grass at this time   Time 12   Period Weeks   Status New   OT LONG TERM GOAL #3   Title will be able to cut his food with or with out assistive devices.   Baseline Unable to cut his food   Time 12   Period Weeks   Status New   OT LONG TERM GOAL #4   Title Will improve enought to possibly return to work.   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   Time 12   Period Weeks   Status New   OT LONG TERM GOAL #6   Title Will be able to tie shoes eather with one handed technique or 2 handed.  Baseline unable to tie his shoes.   Time 12   Period Weeks   Status New   OT LONG TERM GOAL #7   Title Will control L hand edema to be equil to the right in size.   Baseline Patient has significant edema in left hand.   Time 12   Period Weeks   Status New   OT LONG TERM GOAL #8   Title Will increase L UE finger range of motion    Baseline fingers are contracte in flexion.(40%)   Time 12   Period Weeks   Status New              Plan - 02/07/15 1614    Clinical Impression Statement This patient is a 59 year old male who came to [facility] out patient after suffering a cva on October 28 2014. He demonstrates left sided weakness.  His left upper extremity is flaccid with some tone, but no active movement. R UE (dominant) is 5/5 through out and grip is 92 lbs. Light touch is diminished, sharp and temp is hypersensitive,   Patient gets moderate assist with lower body dressing.He reports he can get in and out of the tub with use of a tub bench. 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 He can't cut food.He would benefit from OT for ADL and functional mobility traing and L UE restoration.   Pt will benefit from skilled therapeutic intervention in order to improve on the following deficits (Retired) Abnormal gait;Decreased activity tolerance;Decreased balance;Decreased coordination;Decreased endurance;Decreased knowledge of use of DME;Decreased mobility;Decreased range of motion;Decreased strength;Difficulty walking;Increased edema;Impaired flexibility;Impaired sensation;Impaired tone;Impaired UE functional use   Rehab Potential Good   OT Frequency 2x / week   OT Duration 12 weeks   OT Treatment/Interventions Self-care/ADL training;Electrical Stimulation;DME and/or AE instruction;Energy conservation;Neuromuscular education;Therapeutic exercise;Functional Mobility Training;Manual Therapy;Passive range of motion;Splinting;Therapeutic exercises;Therapeutic activities   Plan ot 2 x per week for 12 weeks.   Consulted and Agree with Plan of Care Patient         Problem List Patient Active Problem List   Diagnosis Date Noted  . Stroke 10/27/2014  . Lymphadenitis 04/09/2011  . HTN (hypertension) 04/09/2011  . DM (diabetes mellitus) 04/09/2011   [name], MS/OTR/L  [name] 02/07/2015, 5:13 PM  [place] [facility] SERVICES [address] [place], [place], [address] Phone: [phone]   Fax:  [phone]

## 2015-02-09 ENCOUNTER — Ambulatory Visit: Payer: No Typology Code available for payment source

## 2015-02-09 ENCOUNTER — Ambulatory Visit: Payer: No Typology Code available for payment source | Admitting: Occupational Therapy

## 2015-02-09 ENCOUNTER — Encounter: Payer: Self-pay | Admitting: Occupational Therapy

## 2015-02-09 DIAGNOSIS — M6281 Muscle weakness (generalized): Secondary | ICD-10-CM

## 2015-02-09 DIAGNOSIS — R531 Weakness: Secondary | ICD-10-CM

## 2015-02-09 DIAGNOSIS — R2681 Unsteadiness on feet: Secondary | ICD-10-CM

## 2015-02-09 NOTE — Therapy (Signed)
[place] MAIN [facility] SERVICES [address] [place], [place], [address] Phone: [phone]   Fax:  [phone]  Occupational Therapy Treatment  Patient Details  Name: Daniel [name] MRN: [id number] Date of Birth: 05-16-1956 Referring Provider:  Marden Noble, MD  Encounter Date: 02/09/2015      OT End of Session - 02/09/15 1609    Visit Number 2   Number of Visits 24   Date for OT Re-Evaluation 05/02/15   OT Start Time 1520   OT Stop Time 1600   OT Time Calculation (min) 40 min   Behavior During Therapy [facility] for tasks assessed/performed      Past Medical History  Diagnosis Date  . Hypertension   . Pancreatitis   . Diabetes mellitus without complication     Past Surgical History  Procedure Laterality Date  . Tee without cardioversion N/A 11/01/2014    Procedure: TRANSESOPHAGEAL ECHOCARDIOGRAM (TEE);  Surgeon: Wellington Hampshire, MD;  Location: [facility] ORS;  Service: Cardiovascular;  Laterality: N/A;    There were no vitals filed for this visit.  Visit Diagnosis:  Muscle weakness of left upper extremity      Subjective Assessment - 02/09/15 1606    Subjective  I sleep on my back and right side   Pertinent History 59 year old male who suffered a CVA on October 28, 2014.    left hand anti edema massage wrist and finger mobilization. completed very slow (secondary to pain and tone) into Patient stretched into reflex inhibiting pattern to aid in normalizing tone. Cues for positioning. Teaching patient and wife to do this at home.                          OT Education - 02/09/15 1608    Education provided Yes   Education Details Educated patient and wife regarding purpose of passive range of motion.   Person(s) Educated Patient;Spouse   Methods Explanation;Demonstration;Verbal cues   Comprehension Verbalized understanding             OT Long Term Goals - 02/07/15 1712    OT LONG TERM GOAL #1   Title Patient  will increase L UE function or compensate to be able to open jars and bottles.   Baseline Unable to open jars and bottles   Time 12   Period Weeks   Status New   OT LONG TERM GOAL #2   Title Will be able to cut his his lawn.   Baseline Unable to cut his grass at this time   Time 12   Period Weeks   Status New   OT LONG TERM GOAL #3   Title will be able to cut his food with or with out assistive devices.   Baseline Unable to cut his food   Time 12   Period Weeks   Status New   OT LONG TERM GOAL #4   Title Will improve enought to possibly return to work.   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   Time 12   Period Weeks   Status New   OT LONG TERM GOAL #6   Title Will be able to tie shoes eather with one handed technique or 2  handed.   Baseline unable to tie his shoes.   Time 12   Period Weeks   Status New   OT LONG TERM GOAL #7   Title Will control L hand edema to be equil to the right in size.   Baseline Patient has significant edema in left hand.   Time 12   Period Weeks   Status New   OT LONG TERM GOAL #8   Title Will increase L UE finger range of motion    Baseline fingers are contracte in flexion.(40%)   Time 12   Period Weeks   Status New               Plan - 02/09/15 1610    Clinical Impression Statement Patient demonstrates significant edema hin left hand. Circumference proximal to cmc's 2, 3, 4, and 5 of right hand 228 mm left hand 222mm fingers 2, 3, 4, and 5 pips are contract. passive extension finger 1 -52o, 3 -50o, 4 -45o, 6 -56o.   Pt will benefit from skilled therapeutic intervention in order to improve on the following deficits (Retired) Abnormal gait;Decreased activity tolerance;Decreased balance;Decreased coordination;Decreased endurance;Decreased knowledge of use of DME;Decreased  mobility;Decreased range of motion;Decreased strength;Difficulty walking;Increased edema;Impaired flexibility;Impaired sensation;Impaired tone;Impaired UE functional use   OT Treatment/Interventions Self-care/ADL training;Electrical Stimulation;DME and/or AE instruction;Energy conservation;Neuromuscular education;Therapeutic exercise;Functional Mobility Training;Manual Therapy;Passive range of motion;Splinting;Therapeutic exercises;Therapeutic activities        Problem List Patient Active Problem List   Diagnosis Date Noted  . Stroke 10/27/2014  . Lymphadenitis 04/09/2011  . HTN (hypertension) 04/09/2011  . DM (diabetes mellitus) 04/09/2011    [name] 02/09/2015, 4:17 PM  [place] MAIN [facility] SERVICES [address] [place], [place], [address] Phone: [phone]   Fax:  [phone]

## 2015-02-09 NOTE — Therapy (Signed)
[place] MAIN [facility] SERVICES [address] [place], [place], [address] Phone: [phone]   Fax:  [phone]  Physical Therapy Evaluation  Patient Details  Name: Daniel [name] MRN: [id number] Date of Birth: Apr 17, 1956 Referring Provider:  Marden Noble, MD  Encounter Date: 02/09/2015      PT End of Session - 02/09/15 1651    Visit Number 1   Number of Visits 17   Date for PT Re-Evaluation 04/06/15   Authorization Type 1/10 G code   PT Start Time 1435   PT Stop Time 1525   PT Time Calculation (min) 50 min   Equipment Utilized During Treatment Gait belt   Activity Tolerance Patient tolerated treatment well;Patient limited by fatigue   Behavior During Therapy [facility] for tasks assessed/performed      Past Medical History  Diagnosis Date  . Hypertension   . Pancreatitis   . Diabetes mellitus without complication     Past Surgical History  Procedure Laterality Date  . Tee without cardioversion N/A 11/01/2014    Procedure: TRANSESOPHAGEAL ECHOCARDIOGRAM (TEE);  Surgeon: Wellington Hampshire, MD;  Location: [facility] ORS;  Service: Cardiovascular;  Laterality: N/A;    There were no vitals filed for this visit.  Visit Diagnosis:  Unsteadiness on feet - Plan: PT plan of care cert/re-cert  Weakness - Plan: PT plan of care cert/re-cert      Subjective Assessment - 02/09/15 1443    Subjective pt reports June 2 suffering a CVA. he was originally seen at [facility] but then transported to [facility]. he reports he was at [facility] for 2 weeks where he underwent rehab. pt reports he has had home health therpay since then. He finished HH PT last week. he reports he was previously unable to stand independently but now can walk around his home independently. pt reports his L UE is affected more than his LLE. pt reports he has numbness on his L side. he reports this extends on the inside of the arm and the outside of the leg. pt denies any pain.    Patient Stated Goals pt  would like to get better use of the LLE and improve his balance.    Currently in Pain? No/denies            [facility] PT Assessment - 02/09/15 1450    Assessment   Medical Diagnosis CVA   Onset Date/Surgical Date 10/28/14   Hand Dominance Right   Prior Therapy PT/OT/ST   Precautions   Precautions Fall   Restrictions   Weight Bearing Restrictions No   Balance Screen   Has the patient fallen in the past 6 months Yes   How many times? 3   Has the patient had a decrease in activity level because of a fear of falling?  Yes   Is the patient reluctant to leave their home because of a fear of falling?  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, fashion/clothing   Prior Function   Level of Independence Independent;Independent with basic ADLs;Independent with household mobility with device   Vocation --  sanding buffing equipment    Cognition   Attention --  L side neglect   Memory --  STM  impaired mild   Behaviors --  pt reports hallucinations   Observation/Other Assessments   Other Surveys  Other Surveys  L impaired peripheral vision   Coordination   Gross Motor Movements are Fluid and Coordinated No   Coordination and Movement Description --  RLE dyskinesia   Finger Nose Finger Test --  intact   Heel Shin Test --  impaired L   ROM / Strength   AROM / PROM / Strength --  RUE 4/5 strength, AROM is WNL, L UE is flaccid   Standardized Balance Assessment   Standardized Balance Assessment Berg Balance Test   Berg Balance Test   Sit to Stand Able to stand  independently using hands   Standing Unsupported Able to stand 2 minutes with supervision   Sitting with Back Unsupported but Feet Supported on Floor or Stool Able to sit 2 minutes under  supervision   Stand to Sit Controls descent by using hands   Transfers Able to transfer safely, definite need of hands   Standing Unsupported with Eyes Closed Able to stand 10 seconds with supervision   Standing Ubsupported with Feet Together Able to place feet together independently and stand for 1 minute with supervision   From Standing, Reach Forward with Outstretched Arm Can reach forward >5 cm safely (2")   From Standing Position, Pick up Object from [place] to pick up shoe, needs supervision   From Standing Position, Turn to Look Behind Over each Shoulder Looks behind one side only/other side shows less weight shift   Turn 360 Degrees Needs close supervision or verbal cueing   Standing Unsupported, Alternately Place Feet on Step/Stool Able to complete >2 steps/needs minimal assist   Standing Unsupported, One Foot in Front Able to take small step independently and hold 30 seconds   Standing on One Leg Tries to lift leg/unable to hold 3 seconds but remains standing independently   Total Score 34   Functional Gait  Assessment   Gait assessed  --      MMT:  LLE: 4/5 generally, mild dyskinesia 5xsit to stand: 30s 10m walk: 0.56m/s (pt needs cues to attend to R side) Berg: 34/56  Pt requires CGA for sit to stand transfers. SBA for stand to sit: cues to attend L side cues for hand placement.                       PT Education - 02/09/15 1651    Education provided Yes   Education Details POC, impairements found   Person(s) Educated Patient   Methods Explanation   Comprehension Verbalized understanding             PT Long Term Goals - 02/09/15 1655    PT LONG TERM GOAL #1   Title pt will improve berg balance score to >46 to demonstrate lower fall risk.    Baseline 34/56   Time 8   Period Weeks   Status New   PT LONG TERM GOAL #2   Title pt will ambulate at 0.62m/s with LRAD to appropriately navigate home distances.    Time 8   Period Weeks   Status  New   PT LONG TERM GOAL #3   Title pt will preform 5xsit to stand in 15 s demonstrating improve LE strength.    Time 8   Period Weeks   Status New   PT LONG TERM GOAL #5   Title pt will be able to safely ambulate x 566ft  using LRAD to negotiate short community distances.    Time 8   Period Weeks   Status New               Plan - March 04, 2015 1652    Clinical Impression Statement pt presents with L hemiparesis after suffering R CVA June 2016. pt reports good improvements since that time,but still has weakness LUE>LLE. pt is recieving OT for his LUe. pt demonstrated impaired LLE strength, coordination, gait balance and  L sided neglect today needing cues to attend to L side especially during ambulation. pt is of high fall risk at this time. pt would benefit from skilled PT services to address the above stated impairements to maximize funciton and reduce fall risk.    Pt will benefit from skilled therapeutic intervention in order to improve on the following deficits Decreased balance;Decreased activity tolerance;Decreased endurance;Decreased mobility;Decreased strength;Increased edema;Decreased safety awareness;Decreased coordination;Difficulty walking;Decreased knowledge of use of DME   Rehab Potential Good   PT Frequency 2x / week   PT Duration 8 weeks   PT Treatment/Interventions ADLs/Self Care Home Management;Electrical Stimulation;Therapeutic exercise;Therapeutic activities;Functional mobility training;Stair training;Gait training;DME Instruction;Balance training;Neuromuscular re-education;Manual techniques;Splinting;Passive range of motion;Visual/perceptual remediation/compensation   Consulted and Agree with Plan of Care Patient          G-Codes - Mar 04, 2015 1657    Functional Assessment Tool Used BERG, 5xsit to stand, 23m walk   Functional Limitation Mobility: Walking and moving around   Mobility: Walking and Moving Around Current Status (T7322) At least 40 percent but less than 60  percent impaired, limited or restricted   Mobility: Walking and Moving Around Goal Status [phone]) At least 1 percent but less than 20 percent impaired, limited or restricted       Problem List Patient Active Problem List   Diagnosis Date Noted  . Stroke 10/27/2014  . Lymphadenitis 04/09/2011  . HTN (hypertension) 04/09/2011  . DM (diabetes mellitus) 04/09/2011   Gorden Harms. Semaya Vida, PT, DPT [phone]  Daniel [name] 03/04/2015, 5:00 PM  [place] MAIN [facility] SERVICES [address] [place], [place], [address] Phone: [phone]   Fax:  [phone]

## 2015-02-09 NOTE — Patient Instructions (Signed)
Instructed on positioning both sitting and in bed to inhibit edema and upper extremity tightness.

## 2015-02-14 ENCOUNTER — Encounter: Payer: No Typology Code available for payment source | Admitting: Occupational Therapy

## 2015-02-14 ENCOUNTER — Ambulatory Visit: Payer: No Typology Code available for payment source

## 2015-02-14 ENCOUNTER — Encounter: Payer: Self-pay | Admitting: Occupational Therapy

## 2015-02-14 ENCOUNTER — Ambulatory Visit: Payer: No Typology Code available for payment source | Admitting: Occupational Therapy

## 2015-02-14 DIAGNOSIS — R2681 Unsteadiness on feet: Secondary | ICD-10-CM

## 2015-02-14 DIAGNOSIS — M6281 Muscle weakness (generalized): Secondary | ICD-10-CM | POA: Diagnosis not present

## 2015-02-14 DIAGNOSIS — R531 Weakness: Secondary | ICD-10-CM

## 2015-02-14 NOTE — Patient Instructions (Signed)
Reinforced the positioning at home as L UE becoming more painful. (positioning in reflex inhibiting position)

## 2015-02-14 NOTE — Therapy (Signed)
[place] MAIN [facility] SERVICES [address] [place], [place], [address] Phone: [phone]   Fax:  [phone]  Occupational Therapy Treatment  Patient Details  Name: Daniel [name] MRN: [id number] Date of Birth: [birth year] Referring Provider:  Marden Noble, MD  Encounter Date: 02/14/2015      OT End of Session - 02/14/15 1652    Visit Number 3   Number of Visits 24   Date for OT Re-Evaluation 05/02/15   OT Start Time 6962   OT Stop Time 1630   OT Time Calculation (min) 41 min   Behavior During Therapy [facility] for tasks assessed/performed      Past Medical History  Diagnosis Date  . Hypertension   . Pancreatitis   . Diabetes mellitus without complication     Past Surgical History  Procedure Laterality Date  . Tee without cardioversion N/A 11/01/2014    Procedure: TRANSESOPHAGEAL ECHOCARDIOGRAM (TEE);  Surgeon: Wellington Hampshire, MD;  Location: [facility] ORS;  Service: Cardiovascular;  Laterality: N/A;    There were no vitals filed for this visit.  Visit Diagnosis:  Weakness  Muscle weakness of left upper extremity      Subjective Assessment - 02/14/15 1650    Subjective  I want to be able to get out of the house more.   Pertinent History 59 year old male who suffered a CVA on October 28, 2014.    [place] wrap of left hand (hand very edematous). Including fingers and palm and back of hand. In supine, scapular mobilization and anterior shoulder mobilization. Attempted shoulder shrugs and scapular retraction, no movement noted. Stretching arm in reflex inhibiting pattern needed to be very slow as it is painful.  Reinforced positioning at home to inhibit reflex synergy, edema, and sublux.                              OT Education - 02/14/15 1651    Education provided Yes   Education Details Education in how muscles can get tighter and tighter if kept in reflex synergy all the time.   Person(s) Educated Patient   Methods Explanation   Comprehension Verbalized understanding             OT Long Term Goals - 02/07/15 1712    OT LONG TERM GOAL #1   Title Patient will increase L UE function or compensate to be able to open jars and bottles.   Baseline Unable to open jars and bottles   Time 12   Period Weeks   Status New   OT LONG TERM GOAL #2   Title Will be able to cut his his lawn.   Baseline Unable to cut his grass at this time   Time 12   Period Weeks   Status New   OT LONG TERM GOAL #3   Title will be able to cut his food with or with out assistive devices.   Baseline Unable to cut his food   Time 12   Period Weeks   Status New   OT LONG TERM GOAL #4   Title Will improve enought to possibly return to work.   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  Time 12   Period Weeks   Status New   OT LONG TERM GOAL #6   Title Will be able to tie shoes eather with one handed technique or 2 handed.   Baseline unable to tie his shoes.   Time 12   Period Weeks   Status New   OT LONG TERM GOAL #7   Title Will control L hand edema to be equil to the right in size.   Baseline Patient has significant edema in left hand.   Time 12   Period Weeks   Status New   OT LONG TERM GOAL #8   Title Will increase L UE finger range of motion    Baseline fingers are contracte in flexion.(40%)   Time 12   Period Weeks   Status New               Plan - 02/14/15 1653    Clinical Impression Statement Patient L UE tighter and more painful and anterior sublux noted in shoulder.   Pt will benefit from skilled therapeutic intervention in order to improve on the following deficits (Retired) Abnormal gait;Decreased activity tolerance;Decreased balance;Decreased coordination;Decreased endurance;Decreased knowledge of use of DME;Decreased  mobility;Decreased range of motion;Decreased strength;Difficulty walking;Increased edema;Impaired flexibility;Impaired sensation;Impaired tone;Impaired UE functional use   OT Treatment/Interventions Self-care/ADL training;Electrical Stimulation;DME and/or AE instruction;Energy conservation;Neuromuscular education;Therapeutic exercise;Functional Mobility Training;Manual Therapy;Passive range of motion;Splinting;Therapeutic exercises;Therapeutic activities   Consulted and Agree with Plan of Care Patient        Problem List Patient Active Problem List   Diagnosis Date Noted  . Stroke 10/27/2014  . Lymphadenitis 04/09/2011  . HTN (hypertension) 04/09/2011  . DM (diabetes mellitus) 04/09/2011   [name], MS/OTR/L  [name] 02/14/2015, 4:56 PM  [place] MAIN [facility] SERVICES [address] [place], [place], [address] Phone: [phone]   Fax:  [phone]

## 2015-02-15 ENCOUNTER — Ambulatory Visit: Payer: No Typology Code available for payment source

## 2015-02-15 ENCOUNTER — Encounter: Payer: No Typology Code available for payment source | Admitting: Speech Pathology

## 2015-02-15 NOTE — Therapy (Addendum)
[place] MAIN [facility] SERVICES [place], [place], [address] Phone: [phone]   Fax:  [phone]  Physical Therapy Treatment  Patient Details  Name: Daniel [name] MRN: [id number] Date of Birth: [birth year] Referring Provider:  Marden Noble, MD  Encounter Date: 02/14/2015      PT End of Session - 02/15/15 0756    Visit Number 2   Number of Visits 17   Date for PT Re-Evaluation 04/06/15   Authorization Type 2/10 G code   PT Start Time 1630   PT Stop Time 7322   PT Time Calculation (min) 45 min   Equipment Utilized During Treatment Gait belt   Activity Tolerance Patient tolerated treatment well;Patient limited by fatigue   Behavior During Therapy [facility] for tasks assessed/performed      Past Medical History  Diagnosis Date  . Hypertension   . Pancreatitis   . Diabetes mellitus without complication     Past Surgical History  Procedure Laterality Date  . Tee without cardioversion N/A 11/01/2014    Procedure: TRANSESOPHAGEAL ECHOCARDIOGRAM (TEE);  Surgeon: Wellington Hampshire, MD;  Location: [facility] ORS;  Service: Cardiovascular;  Laterality: N/A;    There were no vitals filed for this visit.  Visit Diagnosis:  Weakness  Unsteadiness on feet      Subjective Assessment - 02/15/15 0755    Subjective Pt denies any pain currently but states he has intermittent left shoulder pain.    Patient Stated Goals pt would like to get better use of the LLE and improve his balance.    Currently in Pain? No/denies      There ex: Sit to stand x10, pt required verbal cues to sit evenly versus right hip forward 6 min walk test: 310 ft, with CGA assist in quad cane in right UE, pt did not experience any LOB but started to have decreased foot clearance on left near the end  Neuro re-ed: Marching in //bars x4 laps with R UE assist Marching in //bars x2 laps with instruction to maintain SL stance as long as possible Alternating stepping  on 6 inch step x 10 required occasional R UE assist Toe taps on 6 inch step x10 each LE, pt initially required R UE assist but his improved occasionally requiring UE assist Maintain balance while placing one leg on 6 inch step, bilaterally.  Pt required occasional min UE assist, especially when R foot on stool   Pt required CGA throughout session for safety and verbal cues to attend to left side and extra time to perform exercises                           PT Education - 02/15/15 0756    Education provided Yes   Education Details plan of care, 6 minute walk test   Person(s) Educated Patient   Methods Explanation;Demonstration   Comprehension Verbalized understanding;Returned demonstration             PT Long Term Goals - 02/09/15 1655    PT LONG TERM GOAL #1   Title pt will improve berg balance score to >46 to demonstrate lower fall risk.    Baseline 34/56   Time 8   Period Weeks   Status New   PT LONG TERM GOAL #2   Title pt will ambulate at 0.13m/s with LRAD to appropriately navigate home distances.    Time 8   Period Weeks   Status  New   PT LONG TERM GOAL #3   Title pt will preform 5xsit to stand in 15 s demonstrating improve LE strength.    Time 8   Period Weeks   Status New   PT LONG TERM GOAL #5   Title pt will be able to safely ambulate x 546ft using LRAD to negotiate short community distances.    Time 8   Period Weeks   Status New               Plan - 02/15/15 1210    Clinical Impression Statement Pt required cues to attend to left side during 6 minute walk test and while maneuvering around the gym.  Pt began to experience decreased foot clearance near the end of the 6 six minute walk test and displays no left arm swing.  Pt is highly motivated and performs exercises with minimal rest breaks but requires extra time to perform exercises     Pt will benefit from skilled therapeutic intervention in order to improve on the following  deficits Decreased balance;Decreased activity tolerance;Decreased endurance;Decreased mobility;Decreased strength;Increased edema;Decreased safety awareness;Decreased coordination;Difficulty walking;Decreased knowledge of use of DME   Rehab Potential Good   PT Frequency 2x / week   PT Duration 8 weeks   PT Treatment/Interventions ADLs/Self Care Home Management;Electrical Stimulation;Therapeutic exercise;Therapeutic activities;Functional mobility training;Stair training;Gait training;DME Instruction;Balance training;Neuromuscular re-education;Manual techniques;Splinting;Passive range of motion;Visual/perceptual remediation/compensation   PT Next Visit Plan progress HEP   Consulted and Agree with Plan of Care Patient        Problem List Patient Active Problem List   Diagnosis Date Noted  . Stroke 10/27/2014  . Lymphadenitis 04/09/2011  . HTN (hypertension) 04/09/2011  . DM (diabetes mellitus) 04/09/2011   [name], SPT This entire session was performed under direct supervision and direction of a licensed therapist/therapist assistant . I have personally read, edited and approve of the note as written. Gorden Harms. Tortorici, PT, DPT [phone]  Tortorici,Ashley 02/15/2015, 1:24 PM  [place] MAIN [facility] SERVICES [address] [place], [place], [address] Phone: [phone]   Fax:  [phone]

## 2015-02-16 ENCOUNTER — Ambulatory Visit: Payer: No Typology Code available for payment source

## 2015-02-16 ENCOUNTER — Encounter: Payer: No Typology Code available for payment source | Admitting: Occupational Therapy

## 2015-02-16 ENCOUNTER — Encounter: Payer: Self-pay | Admitting: Occupational Therapy

## 2015-02-16 ENCOUNTER — Ambulatory Visit: Payer: No Typology Code available for payment source | Admitting: Occupational Therapy

## 2015-02-16 DIAGNOSIS — M6281 Muscle weakness (generalized): Secondary | ICD-10-CM

## 2015-02-16 DIAGNOSIS — R531 Weakness: Secondary | ICD-10-CM

## 2015-02-16 DIAGNOSIS — R2681 Unsteadiness on feet: Secondary | ICD-10-CM

## 2015-02-16 NOTE — Patient Instructions (Signed)
Reinforced need to get his arm in reflex inhibiting pattern and not to let the arm sit on his chest in flexion synergy.

## 2015-02-16 NOTE — Therapy (Signed)
[place] MAIN [facility] SERVICES [address] [place], [place], [address] Phone: [phone]   Fax:  [phone]  Occupational Therapy Treatment  Patient Details  Name: Daniel [name] MRN: [id number] Date of Birth: 17-Apr-1956 Referring Provider:  Marden Noble, MD  Encounter Date: 02/16/2015      OT End of Session - 02/16/15 1447    Visit Number 4   Number of Visits 24   Date for OT Re-Evaluation 05/02/15   OT Start Time 1345   OT Stop Time 1430   OT Time Calculation (min) 45 min      Past Medical History  Diagnosis Date  . Hypertension   . Pancreatitis   . Diabetes mellitus without complication     Past Surgical History  Procedure Laterality Date  . Tee without cardioversion N/A 11/01/2014    Procedure: TRANSESOPHAGEAL ECHOCARDIOGRAM (TEE);  Surgeon: Wellington Hampshire, MD;  Location: [facility] ORS;  Service: Cardiovascular;  Laterality: N/A;    There were no vitals filed for this visit.  Visit Diagnosis:  Muscle weakness of left upper extremity      Subjective Assessment - 02/16/15 1444    Subjective  I want to be able to get out of the house more.   Pertinent History 59 year old male who suffered a CVA on October 28, 2014.    Passive measurements of L UE and increasing pain - Shoulder flexion 70 degrees, external rotation to neutral, internal rotat58,ion to 60, elbow flexion 100, extension -5, supination neutral, pronation 70, wrist extension,18,  Finger pip extension finger 2-33, finger 3, -28, finger 4 -30, finger 5 -55.  Circumference of hands just proximal to mcp of fingers 2,3,4,and5 R 225 mm, L 250 mm. Anti edema massage and mobilization of wrist, fingers and scapula and head of humerus. Slow passive stretch for L UE  shoulder flexion, external and internal rotation elbow flexion and extension, forearm supination and pronation, wrist flexion and extension, and hand flexion and extension. Passive slow stretch in reflex inhibiting  patter. Patient limited by pain.                            OT Education - 02/16/15 1446    Education provided Yes   Education Details Educated patient as to why he needs to keep arm out away and up to prevent contractures and edema in left hand.             OT Long Term Goals - 02/07/15 1712    OT LONG TERM GOAL #1   Title Patient will increase L UE function or compensate to be able to open jars and bottles.   Baseline Unable to open jars and bottles   Time 12   Period Weeks   Status New   OT LONG TERM GOAL #2   Title Will be able to cut his his lawn.   Baseline Unable to cut his grass at this time   Time 12   Period Weeks   Status New   OT LONG TERM GOAL #3   Title will be able to cut his food with or with out assistive devices.   Baseline Unable to cut his food   Time 12   Period Weeks   Status New   OT LONG TERM GOAL #4   Title Will improve enought to possibly return to work.   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   Time 12   Period Weeks   Status New   OT LONG TERM GOAL #6   Title Will be able to tie shoes eather with one handed technique or 2 handed.   Baseline unable to tie his shoes.   Time 12   Period Weeks   Status New   OT LONG TERM GOAL #7   Title Will control L hand edema to be equil to the right in size.   Baseline Patient has significant edema in left hand.   Time 12   Period Weeks   Status New   OT LONG TERM GOAL #8   Title Will increase L UE finger range of motion    Baseline fingers are contracte in flexion.(40%)   Time 12   Period Weeks   Status New               Plan - 02/16/15 1448    Pt will benefit from skilled therapeutic intervention in order to improve on the following deficits (Retired) Abnormal gait;Decreased activity tolerance;Decreased  balance;Decreased coordination;Decreased endurance;Decreased knowledge of use of DME;Decreased mobility;Decreased range of motion;Decreased strength;Difficulty walking;Increased edema;Impaired flexibility;Impaired sensation;Impaired tone;Impaired UE functional use   OT Treatment/Interventions Self-care/ADL training;Electrical Stimulation;DME and/or AE instruction;Energy conservation;Neuromuscular education;Therapeutic exercise;Functional Mobility Training;Manual Therapy;Passive range of motion;Splinting;Therapeutic exercises;Therapeutic activities        Problem List Patient Active Problem List   Diagnosis Date Noted  . Stroke 10/27/2014  . Lymphadenitis 04/09/2011  . HTN (hypertension) 04/09/2011  . DM (diabetes mellitus) 04/09/2011   [name], MS/OTR/L  [name] 02/16/2015, 2:50 PM  [place] MAIN [facility] SERVICES [address] [place], [place], [address] Phone: [phone]   Fax:  [phone]

## 2015-02-16 NOTE — Therapy (Signed)
[place] MAIN [facility] SERVICES [address] [place], [place], [address] Phone: [phone]   Fax:  [phone]  Physical Therapy Treatment  Patient Details  Name: Daniel [name] MRN: [id number] Date of Birth: [birth year] Referring Provider:  Marden Noble, MD  Encounter Date: 02/16/2015      PT End of Session - 02/16/15 1426    Visit Number 3   Number of Visits 17   Date for PT Re-Evaluation 04/06/15   Authorization Type 3/10 G code   PT Start Time 1430   PT Stop Time 1515   PT Time Calculation (min) 45 min   Equipment Utilized During Treatment Gait belt   Activity Tolerance Patient tolerated treatment well;Patient limited by fatigue   Behavior During Therapy [facility] for tasks assessed/performed      Past Medical History  Diagnosis Date  . Hypertension   . Pancreatitis   . Diabetes mellitus without complication     Past Surgical History  Procedure Laterality Date  . Tee without cardioversion N/A 11/01/2014    Procedure: TRANSESOPHAGEAL ECHOCARDIOGRAM (TEE);  Surgeon: Wellington Hampshire, MD;  Location: [facility] ORS;  Service: Cardiovascular;  Laterality: N/A;    There were no vitals filed for this visit.  Visit Diagnosis:  Weakness  Unsteadiness on feet      Subjective Assessment - 02/16/15 1818    Subjective Pt reports he has been walking a lot at home and has not had any falls or near falls.  He has been having left shoulder pain at night and sometimes wakes him up.     Patient Stated Goals pt would like to get better use of the LLE and improve his balance.    Currently in Pain? No/denies   Multiple Pain Sites No      There ex: Vitals per stroke protocol: 99%O2 and 148/22mmHG 44 bpm Mini squats in //bars x10 Mini squats on AIREX in //bars x10 Left leg press with 40 # x10, pt requires min A to maintain hip in neutral versus externally rotated  Neuro re-ed: Marching in //bars x6 laps with instruction to maintain SL stance as  long as possible and not use R UE, pt required min UE assist during left single leg stance Toe taps on 4inch step x10 each LE, pt initially required R UE assist but improved to occasionally requiring UE assist Maintain balance while placing one leg on 4 inch step, bilaterally and with head turns. Pt required occasional min UE assist, especially when R foot on stool  Stepping over and back x10 each LE, pt required verbal cueing to shift weight Stepping forward and back 10 each LE, pt required verbal cueing to shift weight  Side stepping in //bars x2 laps   Pt required CGA throughout session for safety and verbal cues to attend to left side and extra time to perform exercises                                    PT Education - 02/16/15 1819    Education provided Yes   Education Details improving single leg stance to improve unsteadiness while ambulating   Person(s) Educated Patient   Methods Explanation   Comprehension Verbalized understanding             PT Long Term Goals - 02/09/15 1655    PT LONG TERM GOAL #1   Title pt will improve  berg balance score to >46 to demonstrate lower fall risk.    Baseline 34/56   Time 8   Period Weeks   Status New   PT LONG TERM GOAL #2   Title pt will ambulate at 0.70m/s with LRAD to appropriately navigate home distances.    Time 8   Period Weeks   Status New   PT LONG TERM GOAL #3   Title pt will preform 5xsit to stand in 15 s demonstrating improve LE strength.    Time 8   Period Weeks   Status New   PT LONG TERM GOAL #5   Title pt will be able to safely ambulate x 544ft using LRAD to negotiate short community distances.    Time 8   Period Weeks   Status New               Plan - 02/16/15 1841    Clinical Impression Statement pt was able to complete session without any rest breaks but requires extra time to perform exercises.  pt has difficulty performing smooth L LE movements, especially during  single leg press with low weight.  Pt leans posterior during single leg phase of exercises and requires cueing to lean forward.     Pt will benefit from skilled therapeutic intervention in order to improve on the following deficits Decreased balance;Decreased activity tolerance;Decreased endurance;Decreased mobility;Decreased strength;Increased edema;Decreased safety awareness;Decreased coordination;Difficulty walking;Decreased knowledge of use of DME   Rehab Potential Good   PT Frequency 2x / week   PT Duration 8 weeks   PT Treatment/Interventions ADLs/Self Care Home Management;Electrical Stimulation;Therapeutic exercise;Therapeutic activities;Functional mobility training;Stair training;Gait training;DME Instruction;Balance training;Neuromuscular re-education;Manual techniques;Splinting;Passive range of motion;Visual/perceptual remediation/compensation   PT Next Visit Plan progress HEP   Consulted and Agree with Plan of Care Patient        Problem List Patient Active Problem List   Diagnosis Date Noted  . Stroke 10/27/2014  . Lymphadenitis 04/09/2011  . HTN (hypertension) 04/09/2011  . DM (diabetes mellitus) 04/09/2011   [name], SPT This entire session was performed under direct supervision and direction of a licensed therapist/therapist assistant . I have personally read, edited and approve of the note as written. Gorden Harms. Tortorici, PT, DPT [phone]  Tortorici,Ashley 02/17/2015, 11:44 AM  [place] MAIN [facility] SERVICES [address] [place], [place], [address] Phone: [phone]   Fax:  [phone]

## 2015-02-21 ENCOUNTER — Ambulatory Visit: Payer: No Typology Code available for payment source

## 2015-02-21 ENCOUNTER — Encounter: Payer: Self-pay | Admitting: Occupational Therapy

## 2015-02-21 ENCOUNTER — Ambulatory Visit: Payer: No Typology Code available for payment source | Admitting: Occupational Therapy

## 2015-02-21 ENCOUNTER — Ambulatory Visit: Payer: No Typology Code available for payment source | Admitting: Speech Pathology

## 2015-02-21 DIAGNOSIS — R2681 Unsteadiness on feet: Secondary | ICD-10-CM

## 2015-02-21 DIAGNOSIS — M6281 Muscle weakness (generalized): Secondary | ICD-10-CM

## 2015-02-21 DIAGNOSIS — R41841 Cognitive communication deficit: Secondary | ICD-10-CM

## 2015-02-21 DIAGNOSIS — R531 Weakness: Secondary | ICD-10-CM

## 2015-02-21 NOTE — Patient Instructions (Signed)
Reinforced getting arm away from the body and up as muscles are getting tighter and more painful

## 2015-02-21 NOTE — Therapy (Signed)
[place] MAIN [facility] SERVICES [address] [place], [place], [address] Phone: [phone]   Fax:  [phone]  Physical Therapy Treatment  Patient Details  Name: Daniel [name] MRN: [id number] Date of Birth: June 21, 1955 Referring Provider:  Marden Noble, MD  Encounter Date: 02/21/2015      PT End of Session - 02/21/15 1716    Visit Number 4   Number of Visits 17   Date for PT Re-Evaluation 04/06/15   Authorization Type 4/10 G code   PT Start Time 1525   PT Stop Time 1600   PT Time Calculation (min) 35 min   Equipment Utilized During Treatment Gait belt   Activity Tolerance Patient tolerated treatment well   Behavior During Therapy [facility] for tasks assessed/performed      Past Medical History  Diagnosis Date  . Hypertension   . Pancreatitis   . Diabetes mellitus without complication     Past Surgical History  Procedure Laterality Date  . Tee without cardioversion N/A 11/01/2014    Procedure: TRANSESOPHAGEAL ECHOCARDIOGRAM (TEE);  Surgeon: Wellington Hampshire, MD;  Location: [facility] ORS;  Service: Cardiovascular;  Laterality: N/A;    There were no vitals filed for this visit.  Visit Diagnosis:  Weakness  Unsteadiness on feet      Subjective Assessment - 02/21/15 1716    Subjective Pt relates he is doing well and has no pain in his LEs.  Pt has not fallen or had any near falls at home and is ambulating frequently in his home.  Pt feels his L leg getting stronger with PT   Patient Stated Goals pt would like to get better use of the LLE and improve his balance.    Currently in Pain? No/denies      Session started 10 min late due to patient using restroom prior to PT   There ex: Vitals per stroke protocol: 99%O2 and 145/35mmHG 52 bpm Sit to stand x10, with right leg slightly forward to increase weight on L LE Left leg press with 60# 2x10, pt requires min A to maintain hip in neutral versus externally rotated, pt was almost able to  fully extend his leg today and demonstrated improved control   Neuro re-ed: Marching in //bars x6 laps with instruction to maintain SL stance as long as possible and not use R UE, pt required min UE assist during left single leg stance Stepping over and back 2x10 each LE, pt required verbal cueing to shift weight Pt was able to ambulate 2x70 ft with commands (look up, down, turn left, turn right) and not experience any LOB.  Pt was able to go in the straight line  Pt required CGA throughout session for safety and min verbal cues to attend to left side and extra time to perform exercises                            PT Education - 02/21/15 1716    Education provided Yes   Education Details plan of care and vitals    Person(s) Educated Patient;Spouse   Methods Explanation   Comprehension Verbalized understanding             PT Long Term Goals - 02/09/15 1655    PT LONG TERM GOAL #1   Title pt will improve berg balance score to >46 to demonstrate lower fall risk.    Baseline 34/56   Time 8  Period Weeks   Status New   PT LONG TERM GOAL #2   Title pt will ambulate at 0.19m/s with LRAD to appropriately navigate home distances.    Time 8   Period Weeks   Status New   PT LONG TERM GOAL #3   Title pt will preform 5xsit to stand in 15 s demonstrating improve LE strength.    Time 8   Period Weeks   Status New   PT LONG TERM GOAL #5   Title pt will be able to safely ambulate x 52ft using LRAD to negotiate short community distances.    Time 8   Period Weeks   Status New               Plan - 02/21/15 1721    Clinical Impression Statement pt and pt's wife were educated on vital values safe for exercise.  Pt was able to ambulate on carpeted surface while following commands without any LOB.  Pt requires mod cueing to weight shift/lean forward during single leg stance versus leaning backwards to improve balance.     Pt will benefit from skilled  therapeutic intervention in order to improve on the following deficits Decreased balance;Decreased activity tolerance;Decreased endurance;Decreased mobility;Decreased strength;Increased edema;Decreased safety awareness;Decreased coordination;Difficulty walking;Decreased knowledge of use of DME   Rehab Potential Good   PT Frequency 2x / week   PT Duration 8 weeks   PT Treatment/Interventions ADLs/Self Care Home Management;Electrical Stimulation;Therapeutic exercise;Therapeutic activities;Functional mobility training;Stair training;Gait training;DME Instruction;Balance training;Neuromuscular re-education;Manual techniques;Splinting;Passive range of motion;Visual/perceptual remediation/compensation   PT Next Visit Plan progress HEP   Consulted and Agree with Plan of Care Patient        Problem List Patient Active Problem List   Diagnosis Date Noted  . Stroke 10/27/2014  . Lymphadenitis 04/09/2011  . HTN (hypertension) 04/09/2011  . DM (diabetes mellitus) 04/09/2011   [name], SPT This entire session was performed under direct supervision and direction of a licensed therapist/therapist assistant . I have personally read, edited and approve of the note as written. Gorden Harms. Tortorici, PT, DPT [phone]  Tortorici,Ashley 02/22/2015, 10:03 AM  [place] MAIN [facility] SERVICES [address] [place], [place], [address] Phone: [phone]   Fax:  [phone]

## 2015-02-21 NOTE — Therapy (Signed)
[place] MAIN [facility] SERVICES [address] [place], [place], [address] Phone: [phone]   Fax:  [phone]  Occupational Therapy Treatment  Patient Details  Name: Daniel [name] MRN: [id number] Date of Birth: 1955-11-14 Referring Provider:  Marden Noble, MD  Encounter Date: 02/21/2015      OT End of Session - 02/21/15 1527    Visit Number 5   Number of Visits 24   Date for OT Re-Evaluation 05/02/15   OT Start Time 1430   OT Stop Time 1515   OT Time Calculation (min) 45 min      Past Medical History  Diagnosis Date  . Hypertension   . Pancreatitis   . Diabetes mellitus without complication     Past Surgical History  Procedure Laterality Date  . Tee without cardioversion N/A 11/01/2014    Procedure: TRANSESOPHAGEAL ECHOCARDIOGRAM (TEE);  Surgeon: Wellington Hampshire, MD;  Location: [facility] ORS;  Service: Cardiovascular;  Laterality: N/A;    There were no vitals filed for this visit.  Visit Diagnosis:  Muscle weakness of left upper extremity      Subjective Assessment - 02/21/15 1524    Subjective  I keep thinking I will wake up tomorrow and be normal.   Pertinent History 59 year old male who suffered a CVA on October 28, 2014.    Patient very painful when bringing arm away from the body. Muscles are tight in a flexion synergy. Reinforced importance of postioning away from body. It took extended time of slow stretching to get into reflex inhibiting pattern. Completed weight bearing, shoulder mobilization including pectoralis major and scapula. Anti-edema massage given. Positioned  patient they way want him to position at home when sitting. Wife present. A folded pillow between side and arm and one to support hand.                       OT Education - 02/21/15 1526    Education provided Yes   Education Details Educated why positioning is important    Person(s) Educated Patient;Spouse   Methods Explanation   Comprehension Verbalized understanding             OT Long Term Goals - 02/07/15 1712    OT LONG TERM GOAL #1   Title Patient will increase L UE function or compensate to be able to open jars and bottles.   Baseline Unable to open jars and bottles   Time 12   Period Weeks   Status New   OT LONG TERM GOAL #2   Title Will be able to cut his his lawn.   Baseline Unable to cut his grass at this time   Time 12   Period Weeks   Status New   OT LONG TERM GOAL #3   Title will be able to cut his food with or with out assistive devices.   Baseline Unable to cut his food   Time 12   Period Weeks   Status New   OT LONG TERM GOAL #4   Title Will improve enought to possibly return to work.   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   Time 12   Period Weeks   Status New   OT  LONG TERM GOAL #6   Title Will be able to tie shoes eather with one handed technique or 2 handed.   Baseline unable to tie his shoes.   Time 12   Period Weeks   Status New   OT LONG TERM GOAL #7   Title Will control L hand edema to be equil to the right in size.   Baseline Patient has significant edema in left hand.   Time 12   Period Weeks   Status New   OT LONG TERM GOAL #8   Title Will increase L UE finger range of motion    Baseline fingers are contracte in flexion.(40%)   Time 12   Period Weeks   Status New               Plan - 02/21/15 1528    Clinical Impression Statement Patient continues to be tighter and more painful when stretching to reflex inhibiting pattern. Reinforced importance of positiong away from body.   Pt will benefit from skilled therapeutic intervention in order to improve on the following deficits (Retired) Abnormal gait;Decreased activity tolerance;Decreased balance;Decreased coordination;Decreased endurance;Decreased  knowledge of use of DME;Decreased mobility;Decreased range of motion;Decreased strength;Difficulty walking;Increased edema;Impaired flexibility;Impaired sensation;Impaired tone;Impaired UE functional use   OT Treatment/Interventions Self-care/ADL training;Electrical Stimulation;DME and/or AE instruction;Energy conservation;Neuromuscular education;Therapeutic exercise;Functional Mobility Training;Manual Therapy;Passive range of motion;Splinting;Therapeutic exercises;Therapeutic activities        Problem List Patient Active Problem List   Diagnosis Date Noted  . Stroke 10/27/2014  . Lymphadenitis 04/09/2011  . HTN (hypertension) 04/09/2011  . DM (diabetes mellitus) 04/09/2011   [name], MS/OTR/L  [name] 02/21/2015, 3:31 PM  [place] MAIN [facility] SERVICES [address] [place], [place], [address] Phone: [phone]   Fax:  [phone]

## 2015-02-22 ENCOUNTER — Encounter: Payer: Self-pay | Admitting: Speech Pathology

## 2015-02-22 NOTE — Therapy (Signed)
[place] MAIN [facility] SERVICES [address] [place], [place], [address] Phone: [phone]   Fax:  [phone]  Speech Language Pathology Evaluation  Patient Details  Name: Daniel [name] MRN: [id number] Date of Birth: 06/28/1955 Referring Provider:  Marden Noble, MD  Encounter Date: 02/21/2015      End of Session - 02/22/15 1219    Visit Number 1   Number of Visits 17   Date for SLP Re-Evaluation 04/24/15   SLP Start Time 31   SLP Stop Time  1650   SLP Time Calculation (min) 50 min   Activity Tolerance Patient tolerated treatment well      Past Medical History  Diagnosis Date  . Hypertension   . Pancreatitis   . Diabetes mellitus without complication     Past Surgical History  Procedure Laterality Date  . Tee without cardioversion N/A 11/01/2014    Procedure: TRANSESOPHAGEAL ECHOCARDIOGRAM (TEE);  Surgeon: Wellington Hampshire, MD;  Location: [facility] ORS;  Service: Cardiovascular;  Laterality: N/A;    There were no vitals filed for this visit.  Visit Diagnosis: Cognitive communication deficit - Plan: SLP plan of care cert/re-cert      Subjective Assessment - 02/22/15 1218    Subjective The patient and his wife do not have clear goals regarding cognitive linguistic function   Patient is accompained by: Family member   Currently in Pain? No/denies            SLP Evaluation [facility] - 02/22/15 0001    SLP Visit Information   SLP Received On 02/21/15   Onset Date 10/27/2014   Medical Diagnosis Right MCA CVA   Pain Assessment   Currently in Pain? No/denies   Oral Motor/Sensory Function   Overall Oral Motor/Sensory Function Appears within functional limits for tasks assessed   Motor Speech   Overall Motor Speech Appears within functional limits for tasks assessed   Standardized Assessments   Standardized Assessments  Cognitive Linguistic Quick Test      Cognitive Linguistic Quick Test  The Cognitive Linguistic Quick Test  (CLQT) was administered to assess the relative status of five cognitive domains: attention, memory, language, executive functioning, and visuospatial skills. Scores from 10 tasks were used to estimate severity ratings (for age groups 18-69 years and 70-89 years) for each domain, a clock drawing task, as well as an overall composite severity rating of cognition.    Task    Score Personal Facts  7/8 Symbol Cancellation     0/12 Confrontation Naming    10/10 Clock Drawing  10/13 Story Retelling       5/10 Symbol Trails      1/10 Generative Naming      4/9 Design Memory  3/6 Mazes    3/8 Design Generation    1/13  Cognitive Domain  Severity Rating Attention   Severe  Memory   Severe Executive Function  Severe Language   Mild Visuospatial Skills  Severe Clock Drawing   Mild Composite Severity Rating Severe     Reading: Able to accurately read aloud a paragraph with 75% accuracy and answer questions (given paragraph to refer to) with 80% accuracy.        SLP Education - 02/22/15 1218    Education provided Yes   Education Details Potential goals of cognitive linguistic treatment to address functional cognitive deficits and enhance independence   Person(s) Educated Patient;Spouse   Methods Explanation   Comprehension Verbalized understanding  SLP Long Term Goals - 02/22/15 1222    SLP LONG TERM GOAL #1   Title Patient will demonstrate functional cognitive-communication skills for independent completion of personal responsibilities given mod support.   Time 8   Period Weeks   Status New   SLP LONG TERM GOAL #2   Title Patient will complete complex attention tasks with 80% accuracy.   Time 8   Period Weeks   Status New   SLP LONG TERM GOAL #3   Title Patient will complete complex executive function skills tasks with 80% accuracy.   Time 8   Period Weeks   Status New   SLP LONG TERM GOAL #4   Title Patient will complete complex visual-spatial activities  with 80% accuracy.   Time 8   Period Weeks   Status New          Plan - 02/22/15 1220    Clinical Impression Statement At 17 weeks post onset of right MCA CVA, this 59 year old man is testing with severe cognitive-communication impairment.  The results of the Cognitive Linguistic Quick Test (CLQT) indicate a composite severity rating of severe.  The patient demonstrates severe deficits in the domains of attention, memory domain, executive function, and visuospatial skills.  The patient demonstrated mild deficit in the domain of language and clock drawing.  The patient demonstrates relative strength in language function.    The patient will benefit from skilled speech therapy services for restorative and compensatory treatment of cognitive linguistic impairments, patient/family education, and ongoing assessment of functional needs in the context of cognitive linguistic function.     Speech Therapy Frequency 2x / week   Duration Other (comment)  8 weeks   Potential to Achieve Goals Fair   Potential Considerations Ability to learn/carryover information;Cooperation/participation level;Medical prognosis;Previous level of function;Severity of impairments;Family/community support   SLP Home Exercise Plan To be determined   Consulted and Agree with Plan of Care Patient;Family member/caregiver   Family Member Consulted Spouse        Problem List Patient Active Problem List   Diagnosis Date Noted  . Stroke 10/27/2014  . Lymphadenitis 04/09/2011  . HTN (hypertension) 04/09/2011  . DM (diabetes mellitus) 04/09/2011   [name], MS/CCC- SLP  [name] 02/22/2015, 12:29 PM  [place] MAIN [facility] SERVICES [address] [place], [place], [address] Phone: [phone]   Fax:  [phone]

## 2015-02-23 ENCOUNTER — Encounter: Payer: Self-pay | Admitting: Occupational Therapy

## 2015-02-23 ENCOUNTER — Ambulatory Visit: Payer: No Typology Code available for payment source

## 2015-02-23 ENCOUNTER — Ambulatory Visit: Payer: No Typology Code available for payment source | Admitting: Speech Pathology

## 2015-02-23 ENCOUNTER — Encounter: Payer: Self-pay | Admitting: Speech Pathology

## 2015-02-23 ENCOUNTER — Ambulatory Visit: Payer: No Typology Code available for payment source | Admitting: Occupational Therapy

## 2015-02-23 DIAGNOSIS — R531 Weakness: Secondary | ICD-10-CM

## 2015-02-23 DIAGNOSIS — M6281 Muscle weakness (generalized): Secondary | ICD-10-CM

## 2015-02-23 DIAGNOSIS — R41841 Cognitive communication deficit: Secondary | ICD-10-CM

## 2015-02-23 DIAGNOSIS — R2681 Unsteadiness on feet: Secondary | ICD-10-CM

## 2015-02-23 NOTE — Therapy (Signed)
[place] MAIN [facility] SERVICES [address] [place], [place], [address] Phone: [phone]   Fax:  [phone]  Speech Language Pathology Treatment  Patient Details  Name: Daniel [name] MRN: [id number] Date of Birth: 1956/01/18 Referring Provider:  Marden Noble, MD  Encounter Date: 02/23/2015      End of Session - 02/23/15 1706    Visit Number 2   Number of Visits 17   Date for SLP Re-Evaluation 04/24/15   SLP Start Time 70   SLP Stop Time  1650   SLP Time Calculation (min) 50 min   Activity Tolerance Patient tolerated treatment well      Past Medical History  Diagnosis Date  . Hypertension   . Pancreatitis   . Diabetes mellitus without complication     Past Surgical History  Procedure Laterality Date  . Tee without cardioversion N/A 11/01/2014    Procedure: TRANSESOPHAGEAL ECHOCARDIOGRAM (TEE);  Surgeon: Wellington Hampshire, MD;  Location: [facility] ORS;  Service: Cardiovascular;  Laterality: N/A;    There were no vitals filed for this visit.  Visit Diagnosis: No diagnosis found.      Subjective Assessment - 02/23/15 1702    Subjective The patient reports distress at left-side neglect and comments from others about his state of being, resulting in irritation, taking offense, or anger.   Patient is accompained by: Family member   Currently in Pain? No/denies               ADULT SLP TREATMENT - 02/23/15 0001    General Information   Behavior/Cognition Alert;Cooperative;Agitated;Impulsive   Treatment Provided   Treatment provided Cognitive-Linquistic   Pain Assessment   Pain Assessment No/denies pain   Cognitive-Linquistic Treatment   Treatment focused on Cognition   Skilled Treatment The patient answered yes or no questions with 100% accuracy and followed commands involving gestures with 100% accuracy. His sustained attention to question-asking tasks required a single redirect after a minute and a half out of four  minutes total. He struggled a little more with a scanning visual task at which he had 60% accuracy.   Assessment / Recommendations / Plan   Plan Continue with current plan of care   Progression Toward Goals   Progression toward goals Progressing toward goals          SLP Education - 02/23/15 1703    Education provided Yes   Education Details Educated on the responsibilities of therapists and why "silly questions" are needed to evaluate a patient's status post CVA rather than as a test of intelligence; educated on the effects of RHD on emotions; educated on left-side neglect   Person(s) Educated Patient   Methods Explanation   Comprehension Verbalized understanding            SLP Long Term Goals - 02/22/15 1222    SLP LONG TERM GOAL #1   Title Patient will demonstrate functional cognitive-communication skills for independent completion of personal responsibilities given mod support.   Time 8   Period Weeks   Status New   SLP LONG TERM GOAL #2   Title Patient will complete complex attention tasks with 80% accuracy.   Time 8   Period Weeks   Status New   SLP LONG TERM GOAL #3   Title Patient will complete complex executive function skills tasks with 80% accuracy.   Time 8   Period Weeks   Status New   SLP LONG TERM GOAL #4   Title Patient  will complete complex visual-spatial activities with 80% accuracy.   Time 8   Period Weeks   Status New          Plan - 02/23/15 1707    Clinical Impression Statement The patient exhibits impulsivity and some resentment towards others trying to make him aware of his deficits and demonstrates some symptoms that correlate with left-side neglect; these impressions correlate with a possible RHD post onset of his right MCA CVA.   Speech Therapy Frequency 2x / week   Duration Other (comment)  8 weeks   Potential to Achieve Goals Fair   Potential Considerations Ability to learn/carryover information;Cooperation/participation  level;Medical prognosis;Previous level of function;Severity of impairments;Family/community support   SLP Home Exercise Plan To be determined   Consulted and Agree with Plan of Care Patient;Family member/caregiver   Family Member Consulted Spouse        Problem List Patient Active Problem List   Diagnosis Date Noted  . Stroke 10/27/2014  . Lymphadenitis 04/09/2011  . HTN (hypertension) 04/09/2011  . DM (diabetes mellitus) 04/09/2011    [name] 02/23/2015, 5:10 PM  [place] MAIN [facility] SERVICES [address] [place], [place], [address] Phone: [phone]   Fax:  [phone]

## 2015-02-23 NOTE — Patient Instructions (Signed)
Reinforce importance of positioning and getting arm away from body.

## 2015-02-23 NOTE — Therapy (Signed)
[place] MAIN [facility] SERVICES [address] [place], [place], [address] Phone: [phone]   Fax:  [phone]  Occupational Therapy Treatment  Patient Details  Name: Daniel [name] MRN: [id number] Date of Birth: 28-Feb-1956 Referring Provider:  Marden Noble, MD  Encounter Date: 02/23/2015      OT End of Session - 02/23/15 1524    Visit Number 6   Number of Visits 24   Date for OT Re-Evaluation 05/02/15   OT Start Time 1430   OT Stop Time 1515   OT Time Calculation (min) 45 min      Past Medical History  Diagnosis Date  . Hypertension   . Pancreatitis   . Diabetes mellitus without complication     Past Surgical History  Procedure Laterality Date  . Tee without cardioversion N/A 11/01/2014    Procedure: TRANSESOPHAGEAL ECHOCARDIOGRAM (TEE);  Surgeon: Wellington Hampshire, MD;  Location: [facility] ORS;  Service: Cardiovascular;  Laterality: N/A;    There were no vitals filed for this visit.  Visit Diagnosis:  Muscle weakness of left upper extremity      Subjective Assessment - 02/23/15 1521    Subjective  When I get better there are going to be changes.   Pertinent History 59 year old male who suffered a CVA on October 28, 2014.    Mobilization of scapula, pec major and wrist and fingers. Patient brought in resting hand splint which had too much extension. Adjusted so fingers fit correctly on the splint. Instructed patient and wife on the change. Although better than last session stretching of tight muscle still painful Patient slowly stretched into reflex inhibiting patter. Also stretched L upper extremity into shoulder flexion, external and internal rotation elbow flexion and extension, forearm supination and pronation, wrist flexion and extension, and hand flexion and extension.  Stretches needed to be very slow secondary to muscle tightness and pain. Cues needed for proper postioning.                                OT Long Term Goals - 02/07/15 1712    OT LONG TERM GOAL #1   Title Patient will increase L UE function or compensate to be able to open jars and bottles.   Baseline Unable to open jars and bottles   Time 12   Period Weeks   Status New   OT LONG TERM GOAL #2   Title Will be able to cut his his lawn.   Baseline Unable to cut his grass at this time   Time 12   Period Weeks   Status New   OT LONG TERM GOAL #3   Title will be able to cut his food with or with out assistive devices.   Baseline Unable to cut his food   Time 12   Period Weeks   Status New   OT LONG TERM GOAL #4   Title Will improve enought to possibly return to work.   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   Time 12   Period Weeks   Status New   OT LONG TERM GOAL #6   Title Will be able to tie shoes eather with one  handed technique or 2 handed.   Baseline unable to tie his shoes.   Time 12   Period Weeks   Status New   OT LONG TERM GOAL #7   Title Will control L hand edema to be equil to the right in size.   Baseline Patient has significant edema in left hand.   Time 12   Period Weeks   Status New   OT LONG TERM GOAL #8   Title Will increase L UE finger range of motion    Baseline fingers are contracte in flexion.(40%)   Time 12   Period Weeks   Status New               Plan - 02/23/15 1525    Clinical Impression Statement I seams that he may have positioned his arm the last 2 days as the pain is less and tolerates more range of motion.   Pt will benefit from skilled therapeutic intervention in order to improve on the following deficits (Retired) Abnormal gait;Decreased activity tolerance;Decreased balance;Decreased coordination;Decreased endurance;Decreased knowledge of use of DME;Decreased mobility;Decreased range of motion;Decreased  strength;Difficulty walking;Increased edema;Impaired flexibility;Impaired sensation;Impaired tone;Impaired UE functional use   OT Treatment/Interventions Self-care/ADL training;Electrical Stimulation;DME and/or AE instruction;Energy conservation;Neuromuscular education;Therapeutic exercise;Functional Mobility Training;Manual Therapy;Passive range of motion;Splinting;Therapeutic exercises;Therapeutic activities        Problem List Patient Active Problem List   Diagnosis Date Noted  . Stroke 10/27/2014  . Lymphadenitis 04/09/2011  . HTN (hypertension) 04/09/2011  . DM (diabetes mellitus) 04/09/2011   [name], MS/OTR/L  [name] 02/23/2015, 3:29 PM  [place] MAIN [facility] SERVICES [address] [place], [place], [address] Phone: [phone]   Fax:  [phone]

## 2015-02-24 NOTE — Therapy (Signed)
[place] MAIN [facility] SERVICES [address] [place], [place], [address] Phone: [phone]   Fax:  [phone]  Physical Therapy Treatment  Patient Details  Name: Daniel [name] MRN: [id number] Date of Birth: 12-13-1955 Referring Provider:  Marden Noble, MD  Encounter Date: 02/23/2015      PT End of Session - 02/23/15 1642    Visit Number 5   Number of Visits 17   Date for PT Re-Evaluation 04/06/15   Authorization Type 5/10 G code   PT Start Time 6378   PT Stop Time 1600   PT Time Calculation (min) 45 min   Equipment Utilized During Treatment Gait belt   Activity Tolerance Patient tolerated treatment well   Behavior During Therapy [facility] for tasks assessed/performed      Past Medical History  Diagnosis Date  . Hypertension   . Pancreatitis   . Diabetes mellitus without complication     Past Surgical History  Procedure Laterality Date  . Tee without cardioversion N/A 11/01/2014    Procedure: TRANSESOPHAGEAL ECHOCARDIOGRAM (TEE);  Surgeon: Wellington Hampshire, MD;  Location: [facility] ORS;  Service: Cardiovascular;  Laterality: N/A;    There were no vitals filed for this visit.  Visit Diagnosis:  Weakness  Unsteadiness on feet      Subjective Assessment - 02/24/15 0816    Subjective Pt relates he is doing well and has been ambulating in his home without an assistive device without experiencing any falls or near falls.     Patient Stated Goals pt would like to get better use of the LLE and improve his balance.    Currently in Pain? No/denies     There ex: Vitals per stroke protocol: 135/59 mmHg and 51 bpm  Left leg press with 45# x10 Left leg press with 60#,  Step up on 4 inch step leading with R and coming down with left x5 Step on 4 inch step leading with left and coming down with right x2, pt is hesitant due to fear of left knee buckling L TKE with blue band x4 and discontinued due unable to fully extend knee L TKE with green  band x10  Quad set x10 with 2 sec hold and towel roll.  Pt required verbal and tactile cueing to maintain quad set L SLR with quad set 2x10, pt has difficulty maintain leg straight and requires mod verbal cueing to maintain quad set  neuro re-ed Pt ambulated ~86 ft x2 while following commands (look up/down, turn left/right and stop) on carpet without any LOB.  Pt was able to ambulate in a straight path.  Marching in //bars x2 laps with instruction to maintain SL stance as long as possible, pt requires occasional min U assist and verbal cueing to lean forward versus backwards                         PT Education - 02/24/15 0817    Education provided Yes   Education Details plan of care and peforming SLR for HEP   Person(s) Educated Patient   Methods Explanation   Comprehension Verbalized understanding             PT Long Term Goals - 02/09/15 1655    PT LONG TERM GOAL #1   Title pt will improve berg balance score to >46 to demonstrate lower fall risk.    Baseline 34/56   Time 8   Period Weeks   Status  New   PT LONG TERM GOAL #2   Title pt will ambulate at 0.41m/s with LRAD to appropriately navigate home distances.    Time 8   Period Weeks   Status New   PT LONG TERM GOAL #3   Title pt will preform 5xsit to stand in 15 s demonstrating improve LE strength.    Time 8   Period Weeks   Status New   PT LONG TERM GOAL #5   Title pt will be able to safely ambulate x 562ft using LRAD to negotiate short community distances.    Time 8   Period Weeks   Status New               Plan - 02/24/15 0817    Clinical Impression Statement Pt is able to maneuver around the gym without cueing to attend to left side.  pt demonstrates decreased quad control during leg press , quad set and SLR and would benefit from improving quad strength and eccentric control to descend steps with step over step pattern.     Pt will benefit from skilled therapeutic intervention in  order to improve on the following deficits Decreased balance;Decreased activity tolerance;Decreased endurance;Decreased mobility;Decreased strength;Increased edema;Decreased safety awareness;Decreased coordination;Difficulty walking;Decreased knowledge of use of DME   Rehab Potential Good   PT Frequency 2x / week   PT Duration 8 weeks   PT Treatment/Interventions ADLs/Self Care Home Management;Electrical Stimulation;Therapeutic exercise;Therapeutic activities;Functional mobility training;Stair training;Gait training;DME Instruction;Balance training;Neuromuscular re-education;Manual techniques;Splinting;Passive range of motion;Visual/perceptual remediation/compensation   PT Next Visit Plan progress HEP   Consulted and Agree with Plan of Care Patient        Problem List Patient Active Problem List   Diagnosis Date Noted  . Stroke 10/27/2014  . Lymphadenitis 04/09/2011  . HTN (hypertension) 04/09/2011  . DM (diabetes mellitus) 04/09/2011   [name], SPT This entire session was performed under direct supervision and direction of a licensed therapist/therapist assistant . I have personally read, edited and approve of the note as written. Gorden Harms. Tortorici, PT, DPT [phone]  Tortorici,Ashley 02/24/2015, 9:08 AM  [place] MAIN [facility] SERVICES [address] [place], [place], [address] Phone: [phone]   Fax:  [phone]

## 2015-02-28 ENCOUNTER — Ambulatory Visit: Payer: No Typology Code available for payment source | Attending: Internal Medicine

## 2015-02-28 ENCOUNTER — Ambulatory Visit: Payer: No Typology Code available for payment source | Admitting: Occupational Therapy

## 2015-02-28 DIAGNOSIS — R41841 Cognitive communication deficit: Secondary | ICD-10-CM | POA: Insufficient documentation

## 2015-02-28 DIAGNOSIS — R531 Weakness: Secondary | ICD-10-CM | POA: Diagnosis present

## 2015-02-28 DIAGNOSIS — R2681 Unsteadiness on feet: Secondary | ICD-10-CM | POA: Diagnosis present

## 2015-02-28 DIAGNOSIS — M6281 Muscle weakness (generalized): Secondary | ICD-10-CM | POA: Diagnosis present

## 2015-02-28 NOTE — Therapy (Signed)
[place] MAIN [facility] SERVICES [address] [place], [place], [address] Phone: [phone]   Fax:  [phone]  Occupational Therapy Treatment  Patient Details  Name: Daniel [name] MRN: [id number] Date of Birth: [birth year] Referring Provider:  Marden Noble, MD  Encounter Date: 02/28/2015      OT End of Session - 02/28/15 1619    Visit Number 7   Number of Visits 24   Date for OT Re-Evaluation 05/02/15   OT Start Time 1515   OT Stop Time 1600   OT Time Calculation (min) 45 min      Past Medical History  Diagnosis Date  . Hypertension   . Pancreatitis   . Diabetes mellitus without complication     Past Surgical History  Procedure Laterality Date  . Tee without cardioversion N/A 11/01/2014    Procedure: TRANSESOPHAGEAL ECHOCARDIOGRAM (TEE);  Surgeon: Wellington Hampshire, MD;  Location: [facility] ORS;  Service: Cardiovascular;  Laterality: N/A;    There were no vitals filed for this visit.  Visit Diagnosis:  Weakness  Muscle weakness of left upper extremity      Subjective Assessment - 02/28/15 1616    Subjective  I stretched my arm     Patient stretched into reflex inhibiting pattern through out session to aid in normalizing tone. weight bearing completed to normalize tone. L upper extremity stretches shoulder flexion, external and internal rotation elbow flexion and extension, forearm supination and pronation, wrist flexion and extension, and hand flexion and extension most stretching less than full range secondary to pain. Stretches very slow. Practiced  One handed shoe tie. During the session placed edema glove and checked at end of session with no problems.                          OT Education - 02/28/15 1618    Education provided Yes   Education Details Educated (with physical therapy) in where shoulder pain is comming from             [place] - 02/07/15 [place] #1   Title Patient will increase L UE function or compensate to be able to open jars and bottles.   Baseline Unable to open jars and bottles   Time 12   Period Weeks   Status New   OT LONG TERM GOAL #2   Title Will be able to cut his his lawn.   Baseline Unable to cut his grass at this time   Time 12   Period Weeks   Status New   OT LONG TERM GOAL #3   Title will be able to cut his food with or with out assistive devices.   Baseline Unable to cut his food   Time 12   Period Weeks   Status New   OT LONG TERM GOAL #4   Title Will improve enought to possibly return to work.   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   Time 12   Period Weeks   Status New   OT LONG TERM GOAL #6   Title Will be able to tie shoes eather with one handed technique or 2 handed.  Baseline unable to tie his shoes.   Time 12   Period Weeks   Status New   OT LONG TERM GOAL #7   Title Will control L hand edema to be equil to the right in size.   Baseline Patient has significant edema in left hand.   Time 12   Period Weeks   Status New   OT LONG TERM GOAL #8   Title Will increase L UE finger range of motion    Baseline fingers are contracte in flexion.(40%)   Time 12   Period Weeks   Status New               Plan - 02/28/15 1619    Clinical Impression Statement L UE continues to be painful with movement away from body and in reflex inhibiting patter.   Pt will benefit from skilled therapeutic intervention in order to improve on the following deficits (Retired) Abnormal gait;Decreased activity tolerance;Decreased balance;Decreased coordination;Decreased endurance;Decreased knowledge of use of DME;Decreased mobility;Decreased range of motion;Decreased strength;Difficulty walking;Increased edema;Impaired flexibility;Impaired sensation;Impaired  tone;Impaired UE functional use   OT Treatment/Interventions Self-care/ADL training;Electrical Stimulation;DME and/or AE instruction;Energy conservation;Neuromuscular education;Therapeutic exercise;Functional Mobility Training;Manual Therapy;Passive range of motion;Splinting;Therapeutic exercises;Therapeutic activities        Problem List Patient Active Problem List   Diagnosis Date Noted  . Stroke ([place]) 10/27/2014  . Lymphadenitis 04/09/2011  . HTN (hypertension) 04/09/2011  . DM (diabetes mellitus) ([place]) 04/09/2011   [name], MS/OTR/L   [name] 02/28/2015, 4:24 PM  [place] MAIN [facility] SERVICES [address] [place], [place], [address] Phone: [phone]   Fax:  [phone]

## 2015-03-01 NOTE — Therapy (Signed)
[place] MAIN [facility] SERVICES [address] [place], [place], [address] Phone: [phone]   Fax:  [phone]  Physical Therapy Treatment  Patient Details  Name: Daniel [name] MRN: [id number] Date of Birth: 10-23-1955 Referring Provider:  Marden Noble, MD  Encounter Date: 02/28/2015      PT End of Session - 03/01/15 1242    Visit Number 6   Number of Visits 17   Date for PT Re-Evaluation 04/06/15   Authorization Type 6/10 G code   PT Start Time 1430   PT Stop Time 1515   PT Time Calculation (min) 45 min   Equipment Utilized During Treatment Gait belt   Activity Tolerance Patient tolerated treatment well   Behavior During Therapy [facility] for tasks assessed/performed      Past Medical History  Diagnosis Date  . Hypertension   . Pancreatitis   . Diabetes mellitus without complication [facility])     Past Surgical History  Procedure Laterality Date  . Tee without cardioversion N/A 11/01/2014    Procedure: TRANSESOPHAGEAL ECHOCARDIOGRAM (TEE);  Surgeon: Wellington Hampshire, MD;  Location: [facility] ORS;  Service: Cardiovascular;  Laterality: N/A;    There were no vitals filed for this visit.  Visit Diagnosis:  Weakness  Unsteadiness on feet      Subjective Assessment - 03/01/15 1241    Subjective Pt relates he is having an "off day" today and which started yesterday. He has noticed he has been "wobbly" and tends to lean to the left and feels like he is going to fall and his left leg feels weak   Patient Stated Goals pt would like to get better use of the LLE and improve his balance.    Currently in Pain? No/denies       there ex:  Vitals were taken per stroke protocol 136/41mmHg, 66 bpm Left leg press with 45# x10 Left leg press with 60# x10 Left leg press with 75#x10 Pt required mod A during leg press to maintain knee in neutral position versus externally rotated L TKE with green band 2x10 Side stepping x2 laps in //bars, pt  required min-mod tactile cues to maintain hips centered versus rotated to left when side stepping to the left Squats on [employer] 2x10 L SLR x5  Pt required min verbal cues for correct exercise technique and extra time to perform exercises                                PT Long Term Goals - 02/09/15 1655    PT LONG TERM GOAL #1   Title pt will improve berg balance score to >46 to demonstrate lower fall risk.    Baseline 34/56   Time 8   Period Weeks   Status New   PT LONG TERM GOAL #2   Title pt will ambulate at 0.97m/s with LRAD to appropriately navigate home distances.    Time 8   Period Weeks   Status New   PT LONG TERM GOAL #3   Title pt will preform 5xsit to stand in 15 s demonstrating improve LE strength.    Time 8   Period Weeks   Status New   PT LONG TERM GOAL #5   Title pt will be able to safely ambulate x 534ft using LRAD to negotiate short community distances.    Time 8   Period Weeks   Status New  Plan - 03/01/15 1243    Clinical Impression Statement Pt required extra time to perform exercises and had difficulty fully extending his left leg throughout the session, especially during SLR and TKE exercises. Pt demonstrates decreased quad control and would benefit from improved strengthening and neuromuscular control.     Pt will benefit from skilled therapeutic intervention in order to improve on the following deficits Decreased balance;Decreased activity tolerance;Decreased endurance;Decreased mobility;Decreased strength;Increased edema;Decreased safety awareness;Decreased coordination;Difficulty walking;Decreased knowledge of use of DME   Rehab Potential Good   PT Frequency 2x / week   PT Duration 8 weeks   PT Treatment/Interventions ADLs/Self Care Home Management;Electrical Stimulation;Therapeutic exercise;Therapeutic activities;Functional mobility training;Stair training;Gait training;DME Instruction;Balance  training;Neuromuscular re-education;Manual techniques;Splinting;Passive range of motion;Visual/perceptual remediation/compensation   PT Next Visit Plan progress HEP   Consulted and Agree with Plan of Care Patient        Problem List Patient Active Problem List   Diagnosis Date Noted  . Stroke ([place]) 10/27/2014  . Lymphadenitis 04/09/2011  . HTN (hypertension) 04/09/2011  . DM (diabetes mellitus) ([place]) 04/09/2011   [name], SPT This entire session was performed under direct supervision and direction of a licensed therapist/therapist assistant . I have personally read, edited and approve of the note as written. Gorden Harms. Tortorici, PT, DPT [phone]  Tortorici,Ashley 03/01/2015, 3:11 PM  [place] MAIN [facility] SERVICES [address] [place], [place], [address] Phone: [phone]   Fax:  [phone]

## 2015-03-02 ENCOUNTER — Ambulatory Visit: Payer: No Typology Code available for payment source | Admitting: Speech Pathology

## 2015-03-02 ENCOUNTER — Ambulatory Visit: Payer: No Typology Code available for payment source

## 2015-03-02 ENCOUNTER — Encounter: Payer: Self-pay | Admitting: Speech Pathology

## 2015-03-02 ENCOUNTER — Ambulatory Visit: Payer: No Typology Code available for payment source | Admitting: Occupational Therapy

## 2015-03-02 DIAGNOSIS — M6281 Muscle weakness (generalized): Secondary | ICD-10-CM

## 2015-03-02 DIAGNOSIS — R531 Weakness: Secondary | ICD-10-CM | POA: Diagnosis not present

## 2015-03-02 DIAGNOSIS — R41841 Cognitive communication deficit: Secondary | ICD-10-CM

## 2015-03-02 DIAGNOSIS — R2681 Unsteadiness on feet: Secondary | ICD-10-CM

## 2015-03-02 NOTE — Therapy (Signed)
[place] MAIN [facility] SERVICES [address] [place], [place], [address] Phone: [phone]   Fax:  [phone]  Speech Language Pathology Treatment  Patient Details  Name: Daniel [name] MRN: [id number] Date of Birth: [birth year] Referring Provider:  Marden Noble, MD  Encounter Date: 03/02/2015      End of Session - 03/02/15 1719    Visit Number 3   Number of Visits 17   Date for SLP Re-Evaluation 04/24/15   SLP Start Time 1600   SLP Stop Time  1655   SLP Time Calculation (min) 55 min   Activity Tolerance Patient tolerated treatment well      Past Medical History  Diagnosis Date  . Hypertension   . Pancreatitis   . Diabetes mellitus without complication [facility])     Past Surgical History  Procedure Laterality Date  . Tee without cardioversion N/A 11/01/2014    Procedure: TRANSESOPHAGEAL ECHOCARDIOGRAM (TEE);  Surgeon: Wellington Hampshire, MD;  Location: [facility] ORS;  Service: Cardiovascular;  Laterality: N/A;    There were no vitals filed for this visit.  Visit Diagnosis: No diagnosis found.             ADULT SLP TREATMENT - 03/02/15 0001    General Information   Behavior/Cognition Alert;Cooperative;Agitated;Impulsive   Treatment Provided   Treatment provided Cognitive-Linquistic   Pain Assessment   Pain Assessment No/denies pain   Cognitive-Linquistic Treatment   Treatment focused on Cognition   Skilled Treatment The patient followed vocalized commands involving gestures with 100% accuracy. His sustained attention to this task was good and required no redirects for the 5 minute task. When given a strategy like using a cover sheet to go line by line or doublechecking answers, his performance of a visual scanning task at which he had 60% accuracy last session improved to 75% accuracy.   Assessment / Recommendations / Plan   Plan Continue with current plan of care   Progression Toward Goals   Progression toward goals  Progressing toward goals          SLP Education - 03/02/15 1713    Education provided Yes   Education Details Educated on self-advocacy/providing reasoning when making requests from others to prevent misunderstanding that might cause him irritation; educated on several visual scanning strategies   Person(s) Educated Patient   Methods Explanation   Comprehension Verbalized understanding            SLP Long Term Goals - 02/22/15 1222    SLP LONG TERM GOAL #1   Title Patient will demonstrate functional cognitive-communication skills for independent completion of personal responsibilities given mod support.   Time 8   Period Weeks   Status New   SLP LONG TERM GOAL #2   Title Patient will complete complex attention tasks with 80% accuracy.   Time 8   Period Weeks   Status New   SLP LONG TERM GOAL #3   Title Patient will complete complex executive function skills tasks with 80% accuracy.   Time 8   Period Weeks   Status New   SLP LONG TERM GOAL #4   Title Patient will complete complex visual-spatial activities with 80% accuracy.   Time 8   Period Weeks   Status New          Plan - 03/02/15 1720    Clinical Impression Statement After working with the patient in the areas of attention and visuospatial skills, it is clear that the patient will  benefit from cognitive communication therapy that will target these two areas.   Speech Therapy Frequency 2x / week   Duration Other (comment)  8 weeks   Potential to Achieve Goals Fair   Potential Considerations Ability to learn/carryover information;Cooperation/participation level;Medical prognosis;Previous level of function;Severity of impairments;Family/community support   SLP Home Exercise Plan To be determined   Consulted and Agree with Plan of Care Patient;Family member/caregiver   Family Member Consulted Spouse        Problem List Patient Active Problem List   Diagnosis Date Noted  . Stroke ([place]) 10/27/2014  .  Lymphadenitis 04/09/2011  . HTN (hypertension) 04/09/2011  . DM (diabetes mellitus) ([place]) 04/09/2011    [name] 03/02/2015, 5:23 PM  [place] MAIN [facility] SERVICES [address] [place], [place], [address] Phone: [phone]   Fax:  [phone]

## 2015-03-02 NOTE — Patient Instructions (Signed)
Instructed to keep doing the pillow between arm and side.

## 2015-03-02 NOTE — Therapy (Signed)
[place] MAIN [facility] SERVICES [address] [place], [place], [address] Phone: [phone]   Fax:  [phone]  Occupational Therapy Treatment  Patient Details  Name: Daniel [name] MRN: [id number] Date of Birth: 1955/08/11 Referring Provider:  Marden Noble, MD  Encounter Date: 03/02/2015      OT End of Session - 03/02/15 1615    Visit Number 8   Number of Visits 24   Date for OT Re-Evaluation 05/02/15   OT Start Time 1515   OT Stop Time 1600   OT Time Calculation (min) 45 min      Past Medical History  Diagnosis Date  . Hypertension   . Pancreatitis   . Diabetes mellitus without complication [facility])     Past Surgical History  Procedure Laterality Date  . Tee without cardioversion N/A 11/01/2014    Procedure: TRANSESOPHAGEAL ECHOCARDIOGRAM (TEE);  Surgeon: Wellington Hampshire, MD;  Location: [facility] ORS;  Service: Cardiovascular;  Laterality: N/A;    There were no vitals filed for this visit.  Visit Diagnosis:  Muscle weakness of left upper extremity      Subjective Assessment - 03/02/15 1612    Subjective  I do keep my arm away from my side when I am sitting.   Pertinent History 59 year old male who suffered a CVA on October 28, 2014.    Placed anti edema glove at beginning of session. Removed at end. Measured before and after and hand. Measured 230 mm both times. Very slow stretch into reflex inhibiting pattern and stretching into shoulder flexion, external and internal rotation elbow flexion and extension, forearm supination and pronation, wrist flexion and extension, and hand flexion and extension  repetitions. Mobilization of scapula and fingers. Cues for positioning.                           OT Education - 03/02/15 1613    Education provided Yes   Education Details Educated on what can happen if arm allowed to stay close to side all the time.   Person(s) Educated Patient;Spouse   Methods Explanation   Comprehension Verbalized understanding             OT Long Term Goals - 02/07/15 1712    OT LONG TERM GOAL #1   Title Patient will increase L UE function or compensate to be able to open jars and bottles.   Baseline Unable to open jars and bottles   Time 12   Period Weeks   Status New   OT LONG TERM GOAL #2   Title Will be able to cut his his lawn.   Baseline Unable to cut his grass at this time   Time 12   Period Weeks   Status New   OT LONG TERM GOAL #3   Title will be able to cut his food with or with out assistive devices.   Baseline Unable to cut his food   Time 12   Period Weeks   Status New   OT LONG TERM GOAL #4   Title Will improve enought to possibly return to work.   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   Time 12   Period Weeks  Status New   OT LONG TERM GOAL #6   Title Will be able to tie shoes eather with one handed technique or 2 handed.   Baseline unable to tie his shoes.   Time 12   Period Weeks   Status New   OT LONG TERM GOAL #7   Title Will control L hand edema to be equil to the right in size.   Baseline Patient has significant edema in left hand.   Time 12   Period Weeks   Status New   OT LONG TERM GOAL #8   Title Will increase L UE finger range of motion    Baseline fingers are contracte in flexion.(40%)   Time 12   Period Weeks   Status New               Plan - 03/02/15 1615    Clinical Impression Statement Patient continues to have pain at top of bicep on the tendon.   Pt will benefit from skilled therapeutic intervention in order to improve on the following deficits (Retired) Abnormal gait;Decreased activity tolerance;Decreased balance;Decreased coordination;Decreased endurance;Decreased knowledge of use of DME;Decreased mobility;Decreased range of motion;Decreased  strength;Difficulty walking;Increased edema;Impaired flexibility;Impaired sensation;Impaired tone;Impaired UE functional use   OT Treatment/Interventions Self-care/ADL training;Electrical Stimulation;DME and/or AE instruction;Energy conservation;Neuromuscular education;Therapeutic exercise;Functional Mobility Training;Manual Therapy;Passive range of motion;Splinting;Therapeutic exercises;Therapeutic activities        Problem List Patient Active Problem List   Diagnosis Date Noted  . Stroke ([place]) 10/27/2014  . Lymphadenitis 04/09/2011  . HTN (hypertension) 04/09/2011  . DM (diabetes mellitus) ([place]) 04/09/2011  [name], MS/OTR/L  [name], MS/OTR/L  [name] 03/02/2015, 4:20 PM  [place] MAIN [facility] SERVICES [address] [place], [place], [address] Phone: [phone]   Fax:  [phone]

## 2015-03-03 NOTE — Therapy (Signed)
[place] MAIN [facility] SERVICES [address] [place], [place], [address] Phone: [phone]   Fax:  [phone]  Physical Therapy Treatment  Patient Details  Name: Daniel [name] MRN: [id number] Date of Birth: 13-Mar-1956 Referring Provider:  Marden Noble, MD  Encounter Date: 03/02/2015      PT End of Session - 03/03/15 1107    Visit Number 7   Number of Visits 17   Date for PT Re-Evaluation 04/06/15   Authorization Type 7/10 G code   PT Start Time 1430   PT Stop Time 1515   PT Time Calculation (min) 45 min   Equipment Utilized During Treatment Gait belt   Activity Tolerance Patient tolerated treatment well   Behavior During Therapy [facility] for tasks assessed/performed      Past Medical History  Diagnosis Date  . Hypertension   . Pancreatitis   . Diabetes mellitus without complication [facility])     Past Surgical History  Procedure Laterality Date  . Tee without cardioversion N/A 11/01/2014    Procedure: TRANSESOPHAGEAL ECHOCARDIOGRAM (TEE);  Surgeon: Wellington Hampshire, MD;  Location: [facility] ORS;  Service: Cardiovascular;  Laterality: N/A;    There were no vitals filed for this visit.  Visit Diagnosis:  Weakness  Unsteadiness on feet      Subjective Assessment - 03/03/15 1106    Subjective Pt relates he is doing better today compared to his last session.  pt denies any pain currently and is looking forward to exercise today.     Patient Stated Goals pt would like to get better use of the LLE and improve his balance.    Currently in Pain? No/denies        Neuro re-ed Supine quad set x10 with 3 sec hold, pt required verbal and tactile cueing for maintaining contraction Supine SLR x10, pt is unable to maintain knee extended throughout motion Quad set with NMES x 5 min with 10 sec on/10 sec off, asymmetrical wave form, 24 mA, 350 [place] Long arch quad with NMES x 5 min with 10 sec on/10 sec off, asymmetrical wave form, 24 mA, 350 [place] Pt  required verbal cueing to contract his quad when NMES was on and relax when it was off Squats in //bars 2x15, pt required verbal and tactile cueing to shift his weight to his L LE to focus on extending his knee during concentric phase Instructed pt to look at his left LE whenever he stands and extend his knee if he has it flexed Marching in place x10 each LE SLS in //bars 2x10 each LE, pt was instructed to maintain stance as long as possible and to not flex his knee while maintain L SLS.   Pt requires extra time to perform exercises                          PT Education - 03/03/15 1106    Education provided Yes   Education Details plan of care and the use of NMES   Person(s) Educated Patient   Methods Explanation   Comprehension Verbalized understanding             PT Long Term Goals - 02/09/15 1655    PT LONG TERM GOAL #1   Title pt will improve berg balance score to >46 to demonstrate lower fall risk.    Baseline 34/56   Time 8   Period Weeks   Status New   PT  LONG TERM GOAL #2   Title pt will ambulate at 0.24m/s with LRAD to appropriately navigate home distances.    Time 8   Period Weeks   Status New   PT LONG TERM GOAL #3   Title pt will preform 5xsit to stand in 15 s demonstrating improve LE strength.    Time 8   Period Weeks   Status New   PT LONG TERM GOAL #5   Title pt will be able to safely ambulate x 561ft using LRAD to negotiate short community distances.    Time 8   Period Weeks   Status New               Plan - 03/03/15 1108    Clinical Impression Statement Pt responded well to NMES today and was able to improve his quad set with carry over effect with post NMES exercises.  Pt ambulated with improved heel strike on L LE and SLS stability following NMES.     Pt will benefit from skilled therapeutic intervention in order to improve on the following deficits Decreased balance;Decreased activity tolerance;Decreased  endurance;Decreased mobility;Decreased strength;Increased edema;Decreased safety awareness;Decreased coordination;Difficulty walking;Decreased knowledge of use of DME   Rehab Potential Good   PT Frequency 2x / week   PT Duration 8 weeks   PT Treatment/Interventions ADLs/Self Care Home Management;Electrical Stimulation;Therapeutic exercise;Therapeutic activities;Functional mobility training;Stair training;Gait training;DME Instruction;Balance training;Neuromuscular re-education;Manual techniques;Splinting;Passive range of motion;Visual/perceptual remediation/compensation   PT Next Visit Plan progress HEP   Consulted and Agree with Plan of Care Patient        Problem List Patient Active Problem List   Diagnosis Date Noted  . Stroke ([place]) 10/27/2014  . Lymphadenitis 04/09/2011  . HTN (hypertension) 04/09/2011  . DM (diabetes mellitus) ([place]) 04/09/2011   [name], SPT This entire session was performed under direct supervision and direction of a licensed therapist/therapist assistant . I have personally read, edited and approve of the note as written. Gorden Harms. Tortorici, PT, DPT [phone]  Tortorici,Ashley 03/03/2015, 4:33 PM  [place] MAIN [facility] SERVICES [address] [place], [place], [address] Phone: [phone]   Fax:  [phone]

## 2015-03-07 ENCOUNTER — Ambulatory Visit: Payer: No Typology Code available for payment source | Admitting: Speech Pathology

## 2015-03-07 ENCOUNTER — Ambulatory Visit: Payer: No Typology Code available for payment source | Admitting: Occupational Therapy

## 2015-03-07 ENCOUNTER — Ambulatory Visit: Payer: No Typology Code available for payment source

## 2015-03-07 DIAGNOSIS — R531 Weakness: Secondary | ICD-10-CM

## 2015-03-07 DIAGNOSIS — M6281 Muscle weakness (generalized): Secondary | ICD-10-CM

## 2015-03-07 DIAGNOSIS — R2681 Unsteadiness on feet: Secondary | ICD-10-CM

## 2015-03-07 DIAGNOSIS — R41841 Cognitive communication deficit: Secondary | ICD-10-CM

## 2015-03-07 NOTE — Patient Instructions (Signed)
Instructed to were splint at times or night. (reported he stopped wearing it)

## 2015-03-07 NOTE — Therapy (Signed)
[place] [facility] SERVICES [address] [place], [place], [address] Phone: [phone]   Fax:  [phone]  Occupational Therapy Treatment  Patient Details  Name: Daniel [name] MRN: [id number] Date of Birth: 1956/04/08 Referring Provider:  Marden Noble, MD  Encounter Date: 03/07/2015      OT End of Session - 03/07/15 1615    Visit Number 9   Number of Visits 24   Date for OT Re-Evaluation 05/02/15      Past Medical History  Diagnosis Date  . Hypertension   . Pancreatitis   . Diabetes mellitus without complication [facility])     Past Surgical History  Procedure Laterality Date  . Tee without cardioversion N/A 11/01/2014    Procedure: TRANSESOPHAGEAL ECHOCARDIOGRAM (TEE);  Surgeon: Wellington Hampshire, MD;  Location: [facility] ORS;  Service: Cardiovascular;  Laterality: N/A;    There were no vitals filed for this visit.  Visit Diagnosis:  Muscle weakness of left upper extremity      Subjective Assessment - 03/07/15 1613    Subjective  am going to get better   Pertinent History 59 year old male who suffered a CVA on October 28, 2014.    Mobilization of scapula, bicep tendon, wrist, and fingers. Patient stretched into reflex inhibiting pattern through out session to prevent contractures in flexion synergy. Stretched L UE   shoulder flexion, external and internal rotation elbow flexion and extension, forearm supination and pronation, wrist flexion and extension, and hand flexion and extension. Range limited by pain. All stretches slow. Discussed and demonstrated assistive devices for one handed use including buttoner, nail clippers, rocker knife, and scoop plate. Given information to acquire.                             OT Education - 03/07/15 1614    Education provided Yes   Education Details Educated improtance of wering splint as fingers are curling up.   Person(s) Educated Patient   Methods Explanation   Comprehension Verbalized understanding             OT Long Term Goals - 02/07/15 1712    OT LONG TERM GOAL #1   Title Patient will increase L UE function or compensate to be able to open jars and bottles.   Baseline Unable to open jars and bottles   Time 12   Period Weeks   Status New   OT LONG TERM GOAL #2   Title Will be able to cut his his lawn.   Baseline Unable to cut his grass at this time   Time 12   Period Weeks   Status New   OT LONG TERM GOAL #3   Title will be able to cut his food with or with out assistive devices.   Baseline Unable to cut his food   Time 12   Period Weeks   Status New   OT LONG TERM GOAL #4   Title Will improve enought to possibly return to work.   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   Time 12   Period Weeks   Status New   OT LONG TERM GOAL #6   Title Will be able to tie  shoes eather with one handed technique or 2 handed.   Baseline unable to tie his shoes.   Time 12   Period Weeks   Status New   OT LONG TERM GOAL #7   Title Will control L hand edema to be equil to the right in size.   Baseline Patient has significant edema in left hand.   Time 12   Period Weeks   Status New   OT LONG TERM GOAL #8   Title Will increase L UE finger range of motion    Baseline fingers are contracte in flexion.(40%)   Time 12   Period Weeks   Status New               Plan - 03/07/15 1615    Pt will benefit from skilled therapeutic intervention in order to improve on the following deficits (Retired) Abnormal gait;Decreased activity tolerance;Decreased balance;Decreased coordination;Decreased endurance;Decreased knowledge of use of DME;Decreased mobility;Decreased range of motion;Decreased strength;Difficulty walking;Increased edema;Impaired flexibility;Impaired sensation;Impaired  tone;Impaired UE functional use   OT Treatment/Interventions Self-care/ADL training;Electrical Stimulation;DME and/or AE instruction;Energy conservation;Neuromuscular education;Therapeutic exercise;Functional Mobility Training;Manual Therapy;Passive range of motion;Splinting;Therapeutic exercises;Therapeutic activities        Problem List Patient Active Problem List   Diagnosis Date Noted  . Stroke ([place]) 10/27/2014  . Lymphadenitis 04/09/2011  . HTN (hypertension) 04/09/2011  . DM (diabetes mellitus) ([place]) 04/09/2011    Myrene Galas, MS/OTR/L  03/07/2015, 4:19 PM  [place] MAIN [facility] SERVICES [address] [place], [place], [address] Phone: [phone]   Fax:  [phone]

## 2015-03-08 ENCOUNTER — Encounter: Payer: Self-pay | Admitting: Speech Pathology

## 2015-03-08 NOTE — Therapy (Signed)
[place] MAIN [facility] SERVICES [address] [place], [place], [address] Phone: [phone]   Fax:  [phone]  Speech Language Pathology Treatment  Patient Details  Name: Daniel [name] MRN: [id number] Date of Birth: Feb 15, 1956 Referring Provider:  Marden [place], MD  Encounter Date: 03/07/2015      End of Session - 03/08/15 1059    Visit Number 4   Number of Visits 17   Date for SLP Re-Evaluation 04/24/15   SLP Start Time 1615   SLP Stop Time  2947   SLP Time Calculation (min) 43 min   Activity Tolerance Patient tolerated treatment well      Past Medical History  Diagnosis Date  . Hypertension   . Pancreatitis   . Diabetes mellitus without complication [facility])     Past Surgical History  Procedure Laterality Date  . Tee without cardioversion N/A 11/01/2014    Procedure: TRANSESOPHAGEAL ECHOCARDIOGRAM (TEE);  Surgeon: Wellington Hampshire, MD;  Location: [facility] ORS;  Service: Cardiovascular;  Laterality: N/A;    There were no vitals filed for this visit.  Visit Diagnosis: Cognitive communication deficit      Subjective Assessment - 03/08/15 1058    Subjective "I forget a lot"   Currently in Pain? No/denies           ADULT SLP TREATMENT - 03/08/15 0001    General Information   Behavior/Cognition Alert;Cooperative;Impulsive;Distractible;Requires cueing;Decreased sustained attention   Treatment Provided   Treatment provided Cognitive-Linquistic   Pain Assessment   Pain Assessment No/denies pain   Cognitive-Linquistic Treatment   Treatment focused on Cognition   Skilled Treatment ATTENTION: Count pictured coins with 80% accuracy; errors occurring secondary impulsive responses.  Complete a simple reading/pencil and paper task with 70% accuracy; errors due to mis-reading word, losing place, and skipping items.  Complete simple single digit subtraction with 85% accuracy and no off-task distraction. Complete double digit  subtraction (no borrowing) with 60% accuracy and frequent cues to return to task.  Patient not able to independently complete double digit subtraction requiring borrowing.   Assessment / Recommendations / Plan   Plan Continue with current plan of care   Progression Toward Goals   Progression toward goals Progressing toward goals          SLP Education - 03/08/15 1058    Education provided Yes   Education Details Need to defuse anger to improve cognitive function   Person(s) Educated Patient   Methods Explanation   Comprehension Verbalized understanding            SLP Long Term Goals - 02/22/15 1222    SLP LONG TERM GOAL #1   Title Patient will demonstrate functional cognitive-communication skills for independent completion of personal responsibilities given mod support.   Time 8   Period Weeks   Status New   SLP LONG TERM GOAL #2   Title Patient will complete complex attention tasks with 80% accuracy.   Time 8   Period Weeks   Status New   SLP LONG TERM GOAL #3   Title Patient will complete complex executive function skills tasks with 80% accuracy.   Time 8   Period Weeks   Status New   SLP LONG TERM GOAL #4   Title Patient will complete complex visual-spatial activities with 80% accuracy.   Time 8   Period Weeks   Status New          Plan - 03/08/15 1100    Clinical Impression  Statement The patient is presenting with significant attention deficits that become more disruptive as the cognitive load increases.  He is able to accurately complete simple cognitive tasks with minimal external cues.    Speech Therapy Frequency 2x / week   Duration Other (comment)   Potential to Achieve Goals Fair   Potential Considerations Ability to learn/carryover information;Cooperation/participation level;Medical prognosis;Previous level of function;Severity of impairments;Family/community support   SLP Home Exercise Plan To be determined   Consulted and Agree with Plan of Care  Patient        Problem List Patient Active Problem List   Diagnosis Date Noted  . Stroke ([place]) 10/27/2014  . Lymphadenitis 04/09/2011  . HTN (hypertension) 04/09/2011  . DM (diabetes mellitus) ([place]) 04/09/2011   [name], MS/CCC- SLP  [name] 03/08/2015, 11:01 AM  [place] MAIN [facility] SERVICES [address] [place], [place], [address] Phone: [phone]   Fax:  [phone]

## 2015-03-08 NOTE — Therapy (Signed)
[place] MAIN [facility] SERVICES [address] [place], [place], [address] Phone: [phone]   Fax:  [phone]  Physical Therapy Treatment  Patient Details  Name: Daniel [name] MRN: [id number] Date of Birth: 09-Sep-1955 Referring Provider:  Marden Noble, MD  Encounter Date: 03/07/2015      PT End of Session - 03/08/15 0910    Visit Number 8   Number of Visits 17   Date for PT Re-Evaluation 04/06/15   Authorization Type 8/10 G code   PT Start Time 1430   PT Stop Time 1515   PT Time Calculation (min) 45 min   Equipment Utilized During Treatment Gait belt   Activity Tolerance Patient tolerated treatment well   Behavior During Therapy [facility] for tasks assessed/performed      Past Medical History  Diagnosis Date  . Hypertension   . Pancreatitis   . Diabetes mellitus without complication [facility])     Past Surgical History  Procedure Laterality Date  . Tee without cardioversion N/A 11/01/2014    Procedure: TRANSESOPHAGEAL ECHOCARDIOGRAM (TEE);  Surgeon: Wellington Hampshire, MD;  Location: [facility] ORS;  Service: Cardiovascular;  Laterality: N/A;    There were no vitals filed for this visit.  Visit Diagnosis:  Weakness  Unsteadiness on feet      Subjective Assessment - 03/08/15 0908    Subjective Pt relates he is doing well and has been going outside and using his "scooter" and pushing lawnmower.  He feels stable outside using his cane but is hesitant about going out his back door due with steps.  Pt is excited about exercising today.     Patient Stated Goals pt would like to get better use of the LLE and improve his balance.    Currently in Pain? No/denies        [place] re-ed: NMES x15 min, 330 [place], 23 mA, 5 seconds on, 15 sec off,  5 min performing quad sets followed by 5 min short arch quad and 5 min long arch quad Pt required min verbal cueing to contract his quad when NMES was on and do decrease contraction when it was off Followed  by LE D1 extension PROM (rhythmic initiation) Followed by LE D1 extension AAROM  Pt required tactile and visual cues for correct D1 pattern.  Pt requires extra time to perform exercise and get into position                           PT Education - 03/08/15 0909    Education provided Yes   Education Details plan of care and D1 LE extenstion pattern    Person(s) Educated Patient   Methods Explanation   Comprehension Verbalized understanding             PT Long Term Goals - 02/09/15 1655    PT LONG TERM GOAL #1   Title pt will improve berg balance score to >46 to demonstrate lower fall risk.    Baseline 34/56   Time 8   Period Weeks   Status New   PT LONG TERM GOAL #2   Title pt will ambulate at 0.10m/s with LRAD to appropriately navigate home distances.    Time 8   Period Weeks   Status New   PT LONG TERM GOAL #3   Title pt will preform 5xsit to stand in 15 s demonstrating improve LE strength.    Time 8   Period Weeks  Status New   PT LONG TERM GOAL #5   Title pt will be able to safely ambulate x 545ft using LRAD to negotiate short community distances.    Time 8   Period Weeks   Status New               Plan - 03/08/15 0911    Clinical Impression Statement Pt responded well to NMES displaying improve left quad contraction with exercises. Pt displays left LE flexion synergistic pattern and resists passive D1 extension pattern passively, but improved with each repetition.  Pt was able to stand ambulate with less left knee flexion   Pt will benefit from skilled therapeutic intervention in order to improve on the following deficits Decreased balance;Decreased activity tolerance;Decreased endurance;Decreased mobility;Decreased strength;Increased edema;Decreased safety awareness;Decreased coordination;Difficulty walking;Decreased knowledge of use of DME   Rehab Potential Good   PT Frequency 2x / week   PT Duration 8 weeks   PT  Treatment/Interventions ADLs/Self Care Home Management;Electrical Stimulation;Therapeutic exercise;Therapeutic activities;Functional mobility training;Stair training;Gait training;DME Instruction;Balance training;Neuromuscular re-education;Manual techniques;Splinting;Passive range of motion;Visual/perceptual remediation/compensation   PT Next Visit Plan progress HEP   Consulted and Agree with Plan of Care Patient        Problem List Patient Active Problem List   Diagnosis Date Noted  . Stroke ([place]) 10/27/2014  . Lymphadenitis 04/09/2011  . HTN (hypertension) 04/09/2011  . DM (diabetes mellitus) ([place]) 04/09/2011   Suezanne Cheshire, SPT This entire session was performed under direct supervision and direction of a licensed therapist/therapist assistant . I have personally read, edited and approve of the note as written. Gorden Harms. Tortorici, PT, DPT [id number]   Tortorici,Ashley 03/08/2015, 11:07 AM  [place] MAIN [facility] SERVICES [address] [place], [place], [address] Phone: [phone]   Fax:  [phone]

## 2015-03-09 ENCOUNTER — Ambulatory Visit: Payer: No Typology Code available for payment source

## 2015-03-09 ENCOUNTER — Ambulatory Visit: Payer: No Typology Code available for payment source | Admitting: Occupational Therapy

## 2015-03-09 ENCOUNTER — Encounter: Payer: Self-pay | Admitting: Speech Pathology

## 2015-03-09 ENCOUNTER — Encounter: Payer: Self-pay | Admitting: Occupational Therapy

## 2015-03-09 ENCOUNTER — Ambulatory Visit: Payer: No Typology Code available for payment source | Admitting: Speech Pathology

## 2015-03-09 DIAGNOSIS — R2681 Unsteadiness on feet: Secondary | ICD-10-CM

## 2015-03-09 DIAGNOSIS — M6281 Muscle weakness (generalized): Secondary | ICD-10-CM

## 2015-03-09 DIAGNOSIS — R41841 Cognitive communication deficit: Secondary | ICD-10-CM

## 2015-03-09 DIAGNOSIS — R531 Weakness: Secondary | ICD-10-CM

## 2015-03-09 NOTE — Therapy (Signed)
[place] MAIN [facility] SERVICES [address] [place], [place], [address] Phone: [phone]   Fax:  [phone]  Speech Language Pathology Treatment  Patient Details  Name: Daniel [name] MRN: [id number] Date of Birth: 1955/07/23 Referring Provider:  Marden Noble, MD  Encounter Date: 03/09/2015      End of Session - 03/09/15 1712    Visit Number 5   Number of Visits 17   Date for SLP Re-Evaluation 04/24/15   SLP Start Time 1600   SLP Stop Time  3559   SLP Time Calculation (min) 58 min   Activity Tolerance Patient tolerated treatment well      Past Medical History  Diagnosis Date  . Hypertension   . Pancreatitis   . Diabetes mellitus without complication [facility])     Past Surgical History  Procedure Laterality Date  . Tee without cardioversion N/A 11/01/2014    Procedure: TRANSESOPHAGEAL ECHOCARDIOGRAM (TEE);  Surgeon: Wellington Hampshire, MD;  Location: [facility] ORS;  Service: Cardiovascular;  Laterality: N/A;    There were no vitals filed for this visit.  Visit Diagnosis: No diagnosis found.      Subjective Assessment - 03/09/15 1708    Subjective The patient reports frustration at being unable to do tasks he once found simple, such as simple subtraction.    Currently in Pain? No/denies               ADULT SLP TREATMENT - 03/09/15 0001    General Information   Behavior/Cognition Alert;Cooperative;Impulsive;Distractible;Requires cueing;Decreased sustained attention   Treatment Provided   Treatment provided Cognitive-Linquistic   Pain Assessment   Pain Assessment No/denies pain   Cognitive-Linquistic Treatment   Treatment focused on Cognition   Skilled Treatment ATTENTION: The patient read simple menus, billboards, and product labels with 100% accuracy. He answered questions verbally with 80% accuracy, requiring moderate to maximum cueing to attend to specific parts of the text. He wrote the answers to the questions (with  a model in the text) with 80% accuracy, with errors characterized by skipping certain letters. The patient did skip questions and wrote answers on the incorrect lines due to both impulsivity and left neglect. The patient required six redirects throughout the course of the session.   Assessment / Recommendations / Plan   Plan Continue with current plan of care   Progression Toward Goals   Progression toward goals Progressing toward goals          SLP Education - 03/09/15 1710    Education provided Yes   Education Details Purpose of choosing tasks that are difficult for him to improve his abilities in acts of daily living; pointing out mistakes and requiring him to fix them will be necessary for improve cognitive function   Person(s) Educated Patient   Methods Explanation   Comprehension Verbalized understanding            SLP Long Term Goals - 02/22/15 1222    SLP LONG TERM GOAL #1   Title Patient will demonstrate functional cognitive-communication skills for independent completion of personal responsibilities given mod support.   Time 8   Period Weeks   Status New   SLP LONG TERM GOAL #2   Title Patient will complete complex attention tasks with 80% accuracy.   Time 8   Period Weeks   Status New   SLP LONG TERM GOAL #3   Title Patient will complete complex executive function skills tasks with 80% accuracy.   Time 8  Period Weeks   Status New   SLP LONG TERM GOAL #4   Title Patient will complete complex visual-spatial activities with 80% accuracy.   Time 8   Period Weeks   Status New          Plan - 03/09/15 1713    Clinical Impression Statement The patient is able to attend to functional reading material and obtain answers to questions mostly independently, a good sign for his abilities in the natural environment. Cognitive-communication therapy will help him work on attention, one of his most significant deficits.   Speech Therapy Frequency 2x / week   Duration  Other (comment)   Potential to Achieve Goals Fair   Potential Considerations Ability to learn/carryover information;Cooperation/participation level;Medical prognosis;Previous level of function;Severity of impairments;Family/community support   SLP Home Exercise Plan To be determined   Consulted and Agree with Plan of Care Patient        Problem List Patient Active Problem List   Diagnosis Date Noted  . Stroke ([place]) 10/27/2014  . Lymphadenitis 04/09/2011  . HTN (hypertension) 04/09/2011  . DM (diabetes mellitus) ([place]) 04/09/2011    [name] 03/09/2015, 5:17 PM  [place] MAIN [facility] SERVICES [address] [place], [place], [address] Phone: [phone]   Fax:  [phone]

## 2015-03-09 NOTE — Patient Instructions (Signed)
Reinforced about positioning L upper extremity

## 2015-03-09 NOTE — Therapy (Signed)
[place] MAIN [facility] SERVICES [address] [place], [place], [address] Phone: [phone]   Fax:  [phone]  Occupational Therapy Treatment/Progress Report  Patient Details  Name: Daniel [name] MRN: [id number] Date of Birth: Oct 26, 1955 Referring Provider:  Marden Noble, MD  Encounter Date: 03/09/2015      OT End of Session - 03/09/15 1520    Visit Number 10   Number of Visits 24   Date for OT Re-Evaluation 05/02/15   OT Start Time 1430   OT Stop Time 1515   OT Time Calculation (min) 45 min      Past Medical History  Diagnosis Date  . Hypertension   . Pancreatitis   . Diabetes mellitus without complication [facility])     Past Surgical History  Procedure Laterality Date  . Tee without cardioversion N/A 11/01/2014    Procedure: TRANSESOPHAGEAL ECHOCARDIOGRAM (TEE);  Surgeon: Wellington Hampshire, MD;  Location: [facility] ORS;  Service: Cardiovascular;  Laterality: N/A;    There were no vitals filed for this visit.  Visit Diagnosis:  Muscle weakness of left upper extremity      Subjective Assessment - 03/09/15 1518    Subjective  am going to get better   Pertinent History 59 year old male who suffered a CVA on October 28, 2014.    Slow stretch in reflex inhibiting pattern (very slow secondary to pain)., stretch L UE shoulder flexion, external and internal rotation elbow flexion and extension, forearm supination and pronation, wrist flexion and extension, and hand flexion and extension. Mobilization of scapula, wrist and fingers.                                OT Long Term Goals - 03/09/15 1439    OT LONG TERM GOAL #1   Title Patient will increase L UE function or compensate to be able to open jars and bottles.   Baseline can now open bottles but not jars   Time 12   Period Weeks   Status Partially Met   OT LONG TERM GOAL #2   Title Will be able to cut his his lawn.   Baseline Unable to cut his grass at this  time   Time 12   Period Weeks   Status On-going   OT LONG TERM GOAL #3   Title will be able to cut his food with or with out assistive devices.   Baseline Unable to cut his food   Time 12   Period Weeks   Status On-going   OT LONG TERM GOAL #4   Title Will improve enought to possibly return to work.   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   Time 12   Status Achieved   OT LONG TERM GOAL #6   Title Will be able to tie shoes eather with one handed technique or 2 handed.   Baseline now ties with one handed technique   Time 12   Status Achieved   OT LONG TERM GOAL #7   Title Will control L hand edema to be equil to the right in size.   Baseline hands measure equil   Time 12   Period Weeks  Status Achieved   OT LONG TERM GOAL #8   Title Will increase L UE finger range of motion    Baseline fingers are contracte in flexion.(40%)   Time 12   Period Weeks   Status On-going               Plan - 03/09/15 1520    Clinical Impression Statement Patient has achieved 4 of 8 goals. Patient can open medicine bottles but not jars yet. He cannnot cut meat, but reports he is going to order rocker knife, he now dresses himself and has learned the one handed shoe tie, Although left hand still looks a bit puffy, it measures equil to right just proximal to the cmc joints 2-5. (227m),    Pt will benefit from skilled therapeutic intervention in order to improve on the following deficits (Retired) Abnormal gait;Decreased activity tolerance;Decreased balance;Decreased coordination;Decreased endurance;Decreased knowledge of use of DME;Decreased mobility;Decreased range of motion;Decreased strength;Difficulty walking;Increased edema;Impaired flexibility;Impaired sensation;Impaired tone;Impaired UE functional use   OT Treatment/Interventions  Self-care/ADL training;Electrical Stimulation;DME and/or AE instruction;Energy conservation;Neuromuscular education;Therapeutic exercise;Functional Mobility Training;Manual Therapy;Passive range of motion;Splinting;Therapeutic exercises;Therapeutic activities        Problem List Patient Active Problem List   Diagnosis Date Noted  . [place] ([place] 10/27/2014  . Lymphadenitis 04/09/2011  . HTN (hypertension) 04/09/2011  . DM (diabetes mellitus) ([place] 04/09/2011   JSharon Mt MS/OTR/L  HSharon Mt10/04/2015, 3:28 PM  [place] [facility] [address] [address]Phone: [phone]  Fax:  [phone]

## 2015-03-10 NOTE — Therapy (Signed)
[place] MAIN [facility] SERVICES [address] [place], [place], [address] Phone: [phone]   Fax:  [phone]  Physical Therapy Treatment  Patient Details  Name: Daniel [name] MRN: [id number] Date of Birth: 30-Oct-1955 Referring Provider:  Marden Noble, MD  Encounter Date: 03/09/2015      PT End of Session - 03/10/15 1028    Visit Number 9   Number of Visits 17   Date for PT Re-Evaluation 04/06/15   Authorization Type 9/10 G code   PT Start Time 1515   PT Stop Time 1600   PT Time Calculation (min) 45 min   Equipment Utilized During Treatment Gait belt   Activity Tolerance Patient tolerated treatment well   Behavior During Therapy [facility] for tasks assessed/performed      Past Medical History  Diagnosis Date  . Hypertension   . Pancreatitis   . Diabetes mellitus without complication [facility])     Past Surgical History  Procedure Laterality Date  . Tee without cardioversion N/A 11/01/2014    Procedure: TRANSESOPHAGEAL ECHOCARDIOGRAM (TEE);  Surgeon: Wellington Hampshire, MD;  Location: [facility] ORS;  Service: Cardiovascular;  Laterality: N/A;    There were no vitals filed for this visit.  Visit Diagnosis:  Weakness  Unsteadiness on feet      Subjective Assessment - 03/10/15 1027    Subjective Pt reports he is having one of his "up days" and is looking forward to exercising today.  pt notes his left LE feels stronger since using NMES   Patient Stated Goals pt would like to get better use of the LLE and improve his balance.    Currently in Pain? No/denies        uero re-ed: NMES x 18 min, 330 [place], 23 mA, 5 seconds on, 15 sec off,   5 min performing quad sets followed by 7 min short arch quad and 6 min long arch quad Pt required min verbal cueing to contract his quad when NMES was on and do decrease contraction when it was off Followed by LE D1 extension PROM (rhythmic initiation) Followed by LE D1 extension AAROM   Pt required  tactile and visual cues for correct D1 pattern.  L TKE with red band 2x10, pt required tactile cues to maintain quad contraction for 3 sec Pt requires extra time to perform exercise and get into position                               PT Long Term Goals - 02/09/15 1655    PT LONG TERM GOAL #1   Title pt will improve berg balance score to >46 to demonstrate lower fall risk.    Baseline 34/56   Time 8   Period Weeks   Status New   PT LONG TERM GOAL #2   Title pt will ambulate at 0.16m/s with LRAD to appropriately navigate home distances.    Time 8   Period Weeks   Status New   PT LONG TERM GOAL #3   Title pt will preform 5xsit to stand in 15 s demonstrating improve LE strength.    Time 8   Period Weeks   Status New   PT LONG TERM GOAL #5   Title pt will be able to safely ambulate x 527ft using LRAD to negotiate short community distances.    Time 8   Period Weeks   Status New  Plan - 03/10/15 1031    Clinical Impression Statement Pt responded well to NMES displaying improve left quad contraction with exercises. Pt displays left LE flexion synergistic pattern and resists passive D1 extension pattern passively, but improved with each repetition.  Pt was able to ambulate with less left knee flexion and perform SLR in supine with knee extended today.  pt displays improved quad control after NMES   Pt will benefit from skilled therapeutic intervention in order to improve on the following deficits Decreased balance;Decreased activity tolerance;Decreased endurance;Decreased mobility;Decreased strength;Increased edema;Decreased safety awareness;Decreased coordination;Difficulty walking;Decreased knowledge of use of DME   Rehab Potential Good   PT Frequency 2x / week   PT Duration 8 weeks   PT Treatment/Interventions ADLs/Self Care Home Management;Electrical Stimulation;Therapeutic exercise;Therapeutic activities;Functional mobility training;Stair  training;Gait training;DME Instruction;Balance training;Neuromuscular re-education;Manual techniques;Splinting;Passive range of motion;Visual/perceptual remediation/compensation   PT Next Visit Plan progress HEP   Consulted and Agree with Plan of Care Patient        Problem List Patient Active Problem List   Diagnosis Date Noted  . Stroke ([place]) 10/27/2014  . Lymphadenitis 04/09/2011  . HTN (hypertension) 04/09/2011  . DM (diabetes mellitus) ([place]) 04/09/2011   [name], SPT This entire session was performed under direct supervision and direction of a licensed therapist/therapist assistant . I have personally read, edited and approve of the note as written. Gorden Harms. Tortorici, PT, DPT [phone]  Tortorici,Ashley 03/10/2015, 1:26 PM  [place] [facility] MAIN [facility] SERVICES [address] [place], [place], [address] Phone: [phone]   Fax:  [phone]

## 2015-03-14 ENCOUNTER — Ambulatory Visit: Payer: No Typology Code available for payment source

## 2015-03-14 ENCOUNTER — Ambulatory Visit: Payer: No Typology Code available for payment source | Admitting: Occupational Therapy

## 2015-03-14 ENCOUNTER — Ambulatory Visit: Payer: No Typology Code available for payment source | Admitting: Speech Pathology

## 2015-03-14 ENCOUNTER — Encounter: Payer: Self-pay | Admitting: Speech Pathology

## 2015-03-14 DIAGNOSIS — R531 Weakness: Secondary | ICD-10-CM

## 2015-03-14 DIAGNOSIS — M6281 Muscle weakness (generalized): Secondary | ICD-10-CM

## 2015-03-14 DIAGNOSIS — R2681 Unsteadiness on feet: Secondary | ICD-10-CM

## 2015-03-14 DIAGNOSIS — R41841 Cognitive communication deficit: Secondary | ICD-10-CM

## 2015-03-14 NOTE — Therapy (Signed)
[place] MAIN [facility] SERVICES [address] [place], [place], [address] Phone: [phone]   Fax:  [phone]  Speech Language Pathology Treatment  Patient Details  Name: TRAMMELL BOWDEN MRN: [id number] Date of Birth: 09/26/1955 No Data Recorded  Encounter Date: 03/14/2015      End of Session - 03/14/15 1602    Visit Number 6   Number of Visits 17   Date for SLP Re-Evaluation 04/24/15   SLP Start Time 1500   SLP Stop Time  1555   SLP Time Calculation (min) 55 min   Activity Tolerance Patient tolerated treatment well      Past Medical History  Diagnosis Date  . Hypertension   . Pancreatitis   . Diabetes mellitus without complication [facility])     Past Surgical History  Procedure Laterality Date  . Tee without cardioversion N/A 11/01/2014    Procedure: TRANSESOPHAGEAL ECHOCARDIOGRAM (TEE);  Surgeon: Wellington Hampshire, MD;  Location: [facility] ORS;  Service: Cardiovascular;  Laterality: N/A;    There were no vitals filed for this visit.  Visit Diagnosis: No diagnosis found.      Subjective Assessment - 03/14/15 1600    Subjective The patient reports confusion today and at certain times of the day, everyday, saying "my brain is funny"   Currently in Pain? No/denies   Pain Onset More than a month ago               ADULT SLP TREATMENT - 03/14/15 0001    General Information   Behavior/Cognition Alert;Cooperative;Impulsive;Distractible;Requires cueing;Decreased sustained attention;Confused   Treatment Provided   Treatment provided Cognitive-Linquistic   Pain Assessment   Pain Assessment No/denies pain   Cognitive-Linquistic Treatment   Treatment focused on Cognition   Skilled Treatment EXECUTIVE FUNCTION: Using picture of simple clock faces, verbally told the time with 95% accuracy independently and required minimal cueing. With a calendar to aid him, verbally answered questions about dates, days of the week, and months of the  year with 44% accuracy independently and required moderate to maximal cueing. ATTENTION: Demonstrated good use of sustained attention requiring 6 redirects within a 30 minute period and going 10 minutes before needing the first redirect.    Assessment / Recommendations / Plan   Plan Continue with current plan of care   Progression Toward Goals   Progression toward goals Progressing toward goals          SLP Education - 03/14/15 1601    Education provided Yes   Education Details Compensatory strategies such as counting using fingers, crossing out completed tasks, etc.   Person(s) Educated Patient   Methods Explanation;Demonstration   Comprehension Verbalized understanding;Returned demonstration;Need further instruction            SLP Long Term Goals - 02/22/15 1222    SLP LONG TERM GOAL #1   Title Patient will demonstrate functional cognitive-communication skills for independent completion of personal responsibilities given mod support.   Time 8   Period Weeks   Status New   SLP LONG TERM GOAL #2   Title Patient will complete complex attention tasks with 80% accuracy.   Time 8   Period Weeks   Status New   SLP LONG TERM GOAL #3   Title Patient will complete complex executive function skills tasks with 80% accuracy.   Time 8   Period Weeks   Status New   SLP LONG TERM GOAL #4   Title Patient will complete complex visual-spatial activities with 80%  accuracy.   Time 8   Period Weeks   Status New          Plan - 03/14/15 1602    Clinical Impression Statement The patient is able to attend to functional reading material and obtain answers to some questions independently, a good sign for his abilities in the natural environment. Cognitive-communication therapy will help him work on attention, one of his most significant deficits.   Speech Therapy Frequency 2x / week   Duration Other (comment)   Potential to Achieve Goals Fair   Potential Considerations Ability to  learn/carryover information;Cooperation/participation level;Medical prognosis;Previous level of function;Severity of impairments;Family/community support   SLP Home Exercise Plan To be determined   Consulted and Agree with Plan of Care Patient        Problem List Patient Active Problem List   Diagnosis Date Noted  . Stroke ([place]) 10/27/2014  . Lymphadenitis 04/09/2011  . HTN (hypertension) 04/09/2011  . DM (diabetes mellitus) ([place]) 04/09/2011    [name] 03/14/2015, 4:03 PM  [place] MAIN [facility] SERVICES [address] [place], [place], [address] Phone: [phone]   Fax:  [phone]   Name: LAMINE LATON MRN: [id number] Date of Birth: 01/06/1956

## 2015-03-15 NOTE — Therapy (Signed)
[place] MAIN [facility] SERVICES [address] [place], [place], [address] Phone: [phone]   Fax:  [phone]  Occupational Therapy Treatment  Patient Details  Name: Daniel [name] MRN: [id number] Date of Birth: 11-21-1955 No Data Recorded  Encounter Date: 03/14/2015      OT End of Session - 03/14/15 1555    Visit Number 11   Number of Visits 24   Date for OT Re-Evaluation 05/02/15   OT Start Time 1428   OT Stop Time 1500   OT Time Calculation (min) 32 min   Activity Tolerance Patient tolerated treatment well;Patient limited by pain   Behavior During Therapy [facility] for tasks assessed/performed      Past Medical History  Diagnosis Date  . Hypertension   . Pancreatitis   . Diabetes mellitus without complication [facility])     Past Surgical History  Procedure Laterality Date  . Tee without cardioversion N/A 11/01/2014    Procedure: TRANSESOPHAGEAL ECHOCARDIOGRAM (TEE);  Surgeon: Wellington Hampshire, MD;  Location: [facility] ORS;  Service: Cardiovascular;  Laterality: N/A;    There were no vitals filed for this visit.  Visit Diagnosis:  Weakness  Muscle weakness of left upper extremity      Subjective Assessment - 03/14/15 1553    Subjective  Patient reports he does some exercise at home but reports it hurts when he moves his left arm so he feels he avoids moving it at times.    Pertinent History 59 year old male who suffered a CVA on October 28, 2014.   Limitations L arm use.   Patient Stated Goals To get my left side working   Currently in Pain? Yes   Pain Score 6    Pain Location Arm   Pain Orientation Left   Pain Descriptors / Indicators Aching;Numbness;Sharp   Pain Type Acute pain   Pain Radiating Towards pain at pectoralis area and along bicep tendon on the left, pain with movement in any direction, no pain reported at rest.     Pain Onset More than a month ago   Pain Frequency Intermittent   Multiple Pain Sites No          Patient was late to treatment session this date.  Bed mobility performed with minimal assist for lower extremity management.  Patient was seen for PROM of LUE in supine with mobilization to left shoulder joint for posterior and inferior glides, grade II as well as mobilization to the left scapula for elevation, depression and upwards rotation to decrease pain and increase motion.  PROM to LUE for shoulder flexion, ABD, ER, elbow flexion/extension, supination, wrist extension and finger flexion/extension with prolonged stretch as needed as well as distractors.  AAROM with facilitation attempted for all motions, patient limited by pain.  Limited elbow flexion and extension.  Patient requires slow , prolonged stretching with most motions this date due to pain limitations.  Sitting, patient performing shoulder retraction and elevation with assist from therapist.  Instructed on positioning of arm in sitting with LUE in ABD and elevation to decrease edema and prevent further tightening and contractures.                       OT Education - 03/14/15 1555    Education provided Yes   Education Details LUE ROM, positioning, HEP   Person(s) Educated Patient;Spouse   Methods Explanation;Demonstration;Verbal cues   Comprehension Verbal cues required;Returned demonstration;Verbalized understanding  OT Long Term Goals - 03/09/15 1439    OT LONG TERM GOAL #1   Title Patient will increase L UE function or compensate to be able to open jars and bottles.   Baseline can now open bottles but not jars   Time 12   Period Weeks   Status Partially Met   OT LONG TERM GOAL #2   Title Will be able to cut his his lawn.   Baseline Unable to cut his grass at this time   Time 12   Period Weeks   Status On-going   OT LONG TERM GOAL #3   Title will be able to cut his food with or with out assistive devices.   Baseline Unable to cut his food   Time 12   Period Weeks   Status  On-going   OT LONG TERM GOAL #4   Title Will improve enought to possibly return to work.   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   Time 12   Status Achieved   OT LONG TERM GOAL #6   Title Will be able to tie shoes eather with one handed technique or 2 handed.   Baseline now ties with one handed technique   Time 12   Status Achieved   OT LONG TERM GOAL #7   Title Will control L hand edema to be equil to the right in size.   Baseline hands measure equil   Time 12   Period Weeks   Status Achieved   OT LONG TERM GOAL #8   Title Will increase L UE finger range of motion    Baseline fingers are contracte in flexion.(40%)   Time 12   Period Weeks   Status On-going               Plan - 03/14/15 1556    Clinical Impression Statement Patient demonstrates pain with left UE with all movements.  Denies pain at rest.  Educated on the importance of performing ROM and exercises at home especially to avoid contractures.  He admits he is likely to not move since its painful.  Patient appears to demonstrate a decrease in edema of the left hand, positioning and rationale were reinforced this date to further decreased edema and recommended patient position arm more in ABD when sitting since his pectoralis muscles are showing increased tightness.    Pt will benefit from skilled therapeutic intervention in order to improve on the following deficits (Retired) Abnormal gait;Decreased activity tolerance;Decreased balance;Decreased coordination;Decreased endurance;Decreased knowledge of use of DME;Decreased mobility;Decreased range of motion;Decreased strength;Difficulty walking;Increased edema;Impaired flexibility;Impaired sensation;Impaired tone;Impaired UE functional use   Rehab Potential Good   OT Frequency 2x / week   OT Duration 12 weeks    OT Treatment/Interventions Self-care/ADL training;Electrical Stimulation;DME and/or AE instruction;Energy conservation;Neuromuscular education;Therapeutic exercise;Functional Mobility Training;Manual Therapy;Passive range of motion;Splinting;Therapeutic exercises;Therapeutic activities   Consulted and Agree with Plan of Care Patient        Problem List Patient Active Problem List   Diagnosis Date Noted  . Stroke ([place]) 10/27/2014  . Lymphadenitis 04/09/2011  . HTN (hypertension) 04/09/2011  . DM (diabetes mellitus) ([place]) 04/09/2011    Chrishana Spargur 03/15/2015, 3:11 PM  [place] MAIN REHAB SERVICES [place], [place],  [place] Phone: [phone]   Fax:  [phone]  Name: DOYT CASTELLANA MRN: [id number] Date of Birth: 10-31-1955

## 2015-03-15 NOTE — Addendum Note (Signed)
Addended by: Mariane Masters on: 03/15/2015 10:09 AM   Modules accepted: Orders

## 2015-03-15 NOTE — Therapy (Signed)
[place] MAIN [facility] SERVICES [address] [place], [place], [address] Phone: [phone]   Fax:  [phone]  Physical Therapy Treatment/Progress Note  9/14-10/17/16  Patient Details  Name: Daniel [name] MRN: [id number] Date of Birth: 09-02-1955 Referring Provider: Nicky Pugh   Encounter Date: 03/14/2015      PT End of Session - 03/15/15 0921    Visit Number 10   Number of Visits 17   Date for PT Re-Evaluation 04/06/15   Authorization Type 1/10 g codes   PT Start Time 1600   PT Stop Time 1856   PT Time Calculation (min) 45 min   Equipment Utilized During Treatment Gait belt   Activity Tolerance Patient tolerated treatment well   Behavior During Therapy [facility] for tasks assessed/performed      Past Medical History  Diagnosis Date  . Hypertension   . Pancreatitis   . Diabetes mellitus without complication [facility])     Past Surgical History  Procedure Laterality Date  . Tee without cardioversion N/A 11/01/2014    Procedure: TRANSESOPHAGEAL ECHOCARDIOGRAM (TEE);  Surgeon: Wellington Hampshire, MD;  Location: [facility] ORS;  Service: Cardiovascular;  Laterality: N/A;    There were no vitals filed for this visit.  Visit Diagnosis:  Weakness  Unsteadiness on feet      Subjective Assessment - 03/15/15 0919    Subjective Pt relates he is doing well and feels like he has developed improved strength and balance since the start of PT.     Currently in Pain? No/denies   Pain Score 0-No pain      There ex:  Outcome measures were reassessd to assess progress towards goals 5x sit to stand: 28 sec 2mwalk: 0.54 m/s Berg: 43/56 6 min walk test: 485 ft Pt required SBA throughout session for safety      [facility] - 03/15/15 0928    Assessment   Referring Provider ENicky Pugh                             PT Education - 03/15/15 0920    Education provided Yes   Education Details plan of care and outcome  measure values   Person(s) Educated Patient   Methods Explanation   Comprehension Verbalized understanding             PT Long Term Goals - 03/15/15 0922    PT LONG TERM GOAL #1   Title pt will improve berg balance score to >46 to demonstrate lower fall risk.    Baseline 34/56 on eval; 43/56 on 10/17   Time 8   Period Weeks   Status On-going   PT LONG TERM GOAL #2   Title pt will ambulate at 0.832m with LRAD to appropriately navigate home distances.    Baseline 0.54 m/s on 10/17   Time 8   Period Weeks   Status On-going   PT LONG TERM GOAL #3   Title pt will preform 5xsit to stand in 15 s demonstrating improve LE strength.    Baseline 28 sec on 10/17   Time 8   Period Weeks   Status On-going   PT LONG TERM GOAL #5   Title pt will be able to safely ambulate x 50026fsing LRAD to negotiate short community distances.    Baseline 485 ft in 6 min on 10/17   Time 8   Period Weeks   Status On-going  Plan - 03/31/2015 5284    Clinical Impression Statement Pt has progressed well with PT has partially met his goals for PT at this time.  Pt demonstrates improved LE functional strength, improved functional activity tolerance and has lowered his risk of falls.  Pt would benefit from continued skilled PT services to make further gains, especially lower pt's risk of falls and improve gait speed to be a fully community ambulatory.     Pt will benefit from skilled therapeutic intervention in order to improve on the following deficits Decreased balance;Decreased activity tolerance;Decreased endurance;Decreased mobility;Decreased strength;Increased edema;Decreased safety awareness;Decreased coordination;Difficulty walking;Decreased knowledge of use of DME   Rehab Potential Good   PT Frequency 2x / week   PT Duration 8 weeks   PT Treatment/Interventions ADLs/Self Care Home Management;Electrical Stimulation;Therapeutic exercise;Therapeutic activities;Functional mobility  training;Stair training;Gait training;DME Instruction;Balance training;Neuromuscular re-education;Manual techniques;Splinting;Passive range of motion;Visual/perceptual remediation/compensation   PT Next Visit Plan progress HEP   Consulted and Agree with Plan of Care Patient          G-Codes - 03/31/2015 0924    Functional Assessment Tool Used clinical judgement, history, outcome measures    Functional Limitation Mobility: Walking and moving around   Mobility: Walking and Moving Around Current Status ([id number]) At least 20 percent but less than 40 percent impaired, limited or restricted   Mobility: Walking and Moving Around Goal Status [phone]) At least 1 percent but less than 20 percent impaired, limited or restricted      Problem List Patient Active Problem List   Diagnosis Date Noted  . Stroke ([place]) 10/27/2014  . Lymphadenitis 04/09/2011  . HTN (hypertension) 04/09/2011  . DM (diabetes mellitus) ([place]) 04/09/2011   [name], SPT This entire session was performed under direct supervision and direction of a licensed therapist/therapist assistant . I have personally read, edited and approve of the note as written. Gorden Harms. Tortorici, PT, DPT [phone]  Tortorici,Ashley 2015-03-31, 9:53 AM  [place] MAIN [facility] SERVICES [address] [place], [place], [address] Phone: [phone]   Fax:  [phone]  Name: Daniel [name] MRN: [id number] Date of Birth: 1955/11/08

## 2015-03-16 ENCOUNTER — Ambulatory Visit: Payer: No Typology Code available for payment source | Admitting: Occupational Therapy

## 2015-03-16 ENCOUNTER — Encounter: Payer: Self-pay | Admitting: Speech Pathology

## 2015-03-16 ENCOUNTER — Ambulatory Visit: Payer: No Typology Code available for payment source | Admitting: Speech Pathology

## 2015-03-16 ENCOUNTER — Ambulatory Visit: Payer: No Typology Code available for payment source

## 2015-03-16 DIAGNOSIS — R41841 Cognitive communication deficit: Secondary | ICD-10-CM

## 2015-03-16 DIAGNOSIS — M6281 Muscle weakness (generalized): Secondary | ICD-10-CM

## 2015-03-16 DIAGNOSIS — R2681 Unsteadiness on feet: Secondary | ICD-10-CM

## 2015-03-16 DIAGNOSIS — R531 Weakness: Secondary | ICD-10-CM | POA: Diagnosis not present

## 2015-03-16 NOTE — Therapy (Signed)
[place] MAIN [facility] SERVICES [address] [place], [place], [address] Phone: [phone]   Fax:  [phone]  Speech Language Pathology Treatment  Patient Details  Name: Daniel [name] MRN: [id number] Date of Birth: 10-27-1955 No Data Recorded  Encounter Date: 03/16/2015      End of Session - 03/16/15 1602    Visit Number 7   Number of Visits 17   Date for SLP Re-Evaluation 04/24/15   SLP Start Time 1500   SLP Stop Time  1555   SLP Time Calculation (min) 55 min   Activity Tolerance Patient tolerated treatment well      Past Medical History  Diagnosis Date  . Hypertension   . Pancreatitis   . Diabetes mellitus without complication [facility])     Past Surgical History  Procedure Laterality Date  . Tee without cardioversion N/A 11/01/2014    Procedure: TRANSESOPHAGEAL ECHOCARDIOGRAM (TEE);  Surgeon: Wellington Hampshire, MD;  Location: [facility] ORS;  Service: Cardiovascular;  Laterality: N/A;    There were no vitals filed for this visit.  Visit Diagnosis: No diagnosis found.      Subjective Assessment - 03/16/15 1600    Subjective Patient reports that his main concern is still "thinking"   Patient is accompained by: Family member   Currently in Pain? No/denies               ADULT SLP TREATMENT - 03/16/15 0001    General Information   Behavior/Cognition Alert;Cooperative;Impulsive;Distractible;Requires cueing;Decreased sustained attention;Confused   Treatment Provided   Treatment provided Cognitive-Linquistic   Pain Assessment   Pain Assessment No/denies pain   Cognitive-Linquistic Treatment   Treatment focused on Cognition   Skilled Treatment EXECUTIVE FUNCTION: Using picture of simple clock faces, failed to tell the time accurately for a single trial. With a calendar to aid him, verbally answered questions about dates, days of the week, and months of the year with 50% accuracy independently and required moderate to maximal  cueing. Solved functional read-aloud problems with 40% accuracy, requiring moderate-to-maximal cueing. ATTENTION: Demonstrated good use of sustained attention requiring only 1 to 2 redirects within a 30 minute period and going 10 minutes before needing the first redirect.    Assessment / Recommendations / Plan   Plan Continue with current plan of care   Progression Toward Goals   Progression toward goals Progressing toward goals          SLP Education - 03/16/15 1601    Education provided Yes   Education Details Counseled on choosing courses of action during daily tasks   Person(s) Educated Patient   Methods Explanation   Comprehension Verbalized understanding            SLP Long Term Goals - 02/22/15 1222    SLP LONG TERM GOAL #1   Title Patient will demonstrate functional cognitive-communication skills for independent completion of personal responsibilities given mod support.   Time 8   Period Weeks   Status New   SLP LONG TERM GOAL #2   Title Patient will complete complex attention tasks with 80% accuracy.   Time 8   Period Weeks   Status New   SLP LONG TERM GOAL #3   Title Patient will complete complex executive function skills tasks with 80% accuracy.   Time 8   Period Weeks   Status New   SLP LONG TERM GOAL #4   Title Patient will complete complex visual-spatial activities with 80% accuracy.   Time 8  Period Weeks   Status New          Plan - 03/16/15 1603    Clinical Impression Statement The patient is able to attend to functional reading material and obtain answers to some questions independently, a good sign for his abilities in the natural environment. Cognitive-communication therapy will help him work on attention, one of his most significant deficits.   Speech Therapy Frequency 2x / week   Duration Other (comment)   Potential to Achieve Goals Fair   Potential Considerations Ability to learn/carryover information;Cooperation/participation  level;Medical prognosis;Previous level of function;Severity of impairments;Family/community support   SLP Home Exercise Plan To be determined   Consulted and Agree with Plan of Care Patient        Problem List Patient Active Problem List   Diagnosis Date Noted  . Stroke ([place]) 10/27/2014  . Lymphadenitis 04/09/2011  . HTN (hypertension) 04/09/2011  . DM (diabetes mellitus) ([place]) 04/09/2011    Daniel [name] 03/16/2015, 4:03 PM  [place] MAIN [facility] SERVICES [address] [place], [place], [address] Phone: [phone]   Fax:  [phone]   Name: Daniel [name] MRN: [id number] Date of Birth: 11-10-1955

## 2015-03-17 NOTE — Therapy (Signed)
[place] MAIN [facility] SERVICES [address] [place], [place], [address] Phone: [phone]   Fax:  [phone]  Physical Therapy Treatment  Patient Details  Name: Daniel [name] MRN: [id number] Date of Birth: Dec 19, 1955 Referring Provider: Nicky Pugh   Encounter Date: 03/16/2015      PT End of Session - 03/17/15 0936    Visit Number 11   Number of Visits 17   Date for PT Re-Evaluation 05/09/15   Authorization Type 2/10 g codes   PT Start Time 9150   PT Stop Time 1430   PT Time Calculation (min) 22 min   Equipment Utilized During Treatment Gait belt   Activity Tolerance Patient tolerated treatment well   Behavior During Therapy [facility] for tasks assessed/performed      Past Medical History  Diagnosis Date  . Hypertension   . Pancreatitis   . Diabetes mellitus without complication [facility])     Past Surgical History  Procedure Laterality Date  . Tee without cardioversion N/A 11/01/2014    Procedure: TRANSESOPHAGEAL ECHOCARDIOGRAM (TEE);  Surgeon: Wellington Hampshire, MD;  Location: [facility] ORS;  Service: Cardiovascular;  Laterality: N/A;    There were no vitals filed for this visit.  Visit Diagnosis:  Weakness  Unsteadiness on feet      Subjective Assessment - 03/17/15 0934    Subjective Pt reports he does not feel well today and that he feels about the same prior to CVA.      Currently in Pain? No/denies   Pain Score 0-No pain      Pt arrived 18 minutes late  Neuro re-ed: Vitals were assessed per stroke protocol 109/72 mmHg 62 bpm NMES x 10 min, 330 [place], 27 mA, 5 seconds on, 15 sec off,   5 min performing quad sets followed by 5 min SLR Pt required min verbal cueing to contract his quad when NMES was on and do decrease contraction when it was off                           PT Education - 03/17/15 0935    Education provided Yes   Education Details plan of care, vitals and to seek medical attention if he  feels worse or experiences sudden weakness    Person(s) Educated Patient   Methods Explanation   Comprehension Verbalized understanding             PT Long Term Goals - 03/15/15 5697    PT LONG TERM GOAL #1   Title pt will improve berg balance score to >46 to demonstrate lower fall risk.    Baseline 34/56 on eval; 43/56 on 10/17   Time 8   Period Weeks   Status On-going   PT LONG TERM GOAL #2   Title pt will ambulate at 0.61m/s with LRAD to appropriately navigate home distances.    Baseline 0.54 m/s on 10/17   Time 8   Period Weeks   Status On-going   PT LONG TERM GOAL #3   Title pt will preform 5xsit to stand in 15 s demonstrating improve LE strength.    Baseline 28 sec on 10/17   Time 8   Period Weeks   Status On-going   PT LONG TERM GOAL #5   Title pt will be able to safely ambulate x 521ft using LRAD to negotiate short community distances.    Baseline 485 ft in 6 min on 10/17  Time 8   Period Weeks   Status On-going               Plan - 03/17/15 0034    Clinical Impression Statement Todays' session was limited due to patient arriving late and having OT after his scheduled time with PT.  Pt demonstrates improved quad control today during SLR, pt was able to maintain L knee extended throughout motion in the final minutes of using NMES.     Pt will benefit from skilled therapeutic intervention in order to improve on the following deficits Decreased balance;Decreased activity tolerance;Decreased endurance;Decreased mobility;Decreased strength;Increased edema;Decreased safety awareness;Decreased coordination;Difficulty walking;Decreased knowledge of use of DME   Rehab Potential Good   PT Frequency 2x / week   PT Duration 8 weeks   PT Treatment/Interventions ADLs/Self Care Home Management;Electrical Stimulation;Therapeutic exercise;Therapeutic activities;Functional mobility training;Stair training;Gait training;DME Instruction;Balance training;Neuromuscular  re-education;Manual techniques;Splinting;Passive range of motion;Visual/perceptual remediation/compensation   PT Next Visit Plan progress HEP   Consulted and Agree with Plan of Care Patient        Problem List Patient Active Problem List   Diagnosis Date Noted  . Stroke ([place]) 10/27/2014  . Lymphadenitis 04/09/2011  . HTN (hypertension) 04/09/2011  . DM (diabetes mellitus) ([place]) 04/09/2011   [name], SPT This entire session was performed under direct supervision and direction of a licensed therapist/therapist assistant . I have personally read, edited and approve of the note as written. Gorden Harms. Tortorici, PT, DPT [phone]  Tortorici,Ashley 03/17/2015, 11:59 AM  [place] MAIN [facility] SERVICES [address] [place], [place], [address] Phone: [phone]   Fax:  [phone]  Name: Daniel [name] MRN: [id number] Date of Birth: 1955/07/23

## 2015-03-17 NOTE — Therapy (Signed)
[place] MAIN [facility] SERVICES [address] [place], [place], [address] Phone: [phone]   Fax:  [phone]  Occupational Therapy Treatment  Patient Details  Name: Daniel [name] MRN: [id number] Date of Birth: May 01, 1956 No Data Recorded  Encounter Date: 03/16/2015      OT End of Session - 03/17/15 1057    Visit Number 12   Number of Visits 24   Date for OT Re-Evaluation 05/02/15   OT Start Time 1430   OT Stop Time 1500   OT Time Calculation (min) 30 min   Activity Tolerance Patient tolerated treatment well;Patient limited by pain   Behavior During Therapy [facility] for tasks assessed/performed      Past Medical History  Diagnosis Date  . Hypertension   . Pancreatitis   . Diabetes mellitus without complication [facility])     Past Surgical History  Procedure Laterality Date  . Tee without cardioversion N/A 11/01/2014    Procedure: TRANSESOPHAGEAL ECHOCARDIOGRAM (TEE);  Surgeon: Wellington Hampshire, MD;  Location: [facility] ORS;  Service: Cardiovascular;  Laterality: N/A;    There were no vitals filed for this visit.  Visit Diagnosis:  Weakness  Muscle weakness of left upper extremity      Subjective Assessment - 03/16/15 1500    Subjective  Patient laughing and joking during session, states, "I need to get better quick, I am tired of my wife having to do everything for me, I need some space."   Limitations L arm use.   Patient Stated Goals To get my left side working   Currently in Pain? Yes   Pain Score 4    Pain Location Arm   Pain Orientation Left   Pain Descriptors / Indicators Aching;Numbness   Pain Onset More than a month ago   Pain Frequency Intermittent   Multiple Pain Sites No          Patient was seen for PROM of LUE in supine with mobilization to left shoulder joint for posterior and inferior glides, grade II as well as mobilization to the left scapula for elevation, depression and upwards rotation to decrease pain and  increase motion performed in sidelying. PROM to LUE for shoulder flexion, ABD, ER, elbow flexion/extension, supination, wrist extension and finger flexion/extension with prolonged stretch as needed as well as distractors. AAROM with facilitation attempted for all motions, patient limited by pain. Limited elbow flexion and extension but able to demo trace extension on command and with facilitation. Patient requires slow , prolonged stretching with most motions this date due to pain limitations. Sitting, patient performing shoulder retraction and elevation with assist from therapist. Patient instructed to perform daily ROM at home and importance of being consistent with performance.                        OT Education - 03/16/15 1500    Education provided Yes   Education Details HEP, ROM, positioning of left arm   Person(s) Educated Patient   Methods Explanation;Demonstration;Verbal cues;Tactile cues   Comprehension Verbal cues required;Returned demonstration;Verbalized understanding;Tactile cues required             OT Long Term Goals - 03/09/15 1439    OT LONG TERM GOAL #1   Title Patient will increase L UE function or compensate to be able to open jars and bottles.   Baseline can now open bottles but not jars   Time 12   Period Weeks   Status Partially  Met   OT LONG TERM GOAL #2   Title Will be able to cut his his lawn.   Baseline Unable to cut his grass at this time   Time 12   Period Weeks   Status On-going   OT LONG TERM GOAL #3   Title will be able to cut his food with or with out assistive devices.   Baseline Unable to cut his food   Time 12   Period Weeks   Status On-going   OT LONG TERM GOAL #4   Title Will improve enought to possibly return to work.   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    Time 12   Status Achieved   OT LONG TERM GOAL #6   Title Will be able to tie shoes eather with one handed technique or 2 handed.   Baseline now ties with one handed technique   Time 12   Status Achieved   OT LONG TERM GOAL #7   Title Will control L hand edema to be equil to the right in size.   Baseline hands measure equil   Time 12   Period Weeks   Status Achieved   OT LONG TERM GOAL #8   Title Will increase L UE finger range of motion    Baseline fingers are contracte in flexion.(40%)   Time 12   Period Weeks   Status On-going               Plan - 03/17/15 1058    Clinical Impression Statement Patient continues to demo pain with ROM exercises and attempts at facilitation, responds well to scapular mobilizations prior in sidelying prior to ROM and reports decreased pain and increased motion by end of treatment.  Patient will need to be consistent with performance of ROM at home to see results and to avoid further contractures.  Demonstrates trace movements for elbow extension and shoulder flexion.   Pt will benefit from skilled therapeutic intervention in order to improve on the following deficits (Retired) Abnormal gait;Decreased activity tolerance;Decreased balance;Decreased coordination;Decreased endurance;Decreased knowledge of use of DME;Decreased mobility;Decreased range of motion;Decreased strength;Difficulty walking;Increased edema;Impaired flexibility;Impaired sensation;Impaired tone;Impaired UE functional use   Rehab Potential Good   OT Frequency 2x / week   OT Duration 12 weeks   OT Treatment/Interventions Self-care/ADL training;Electrical Stimulation;DME and/or AE instruction;Energy conservation;Neuromuscular education;Therapeutic exercise;Functional Mobility Training;Manual Therapy;Passive range of motion;Splinting;Therapeutic exercises;Therapeutic activities   Consulted and Agree with Plan of Care Patient        Problem List Patient Active Problem List    Diagnosis Date Noted  . Stroke ([place]) 10/27/2014  . Lymphadenitis 04/09/2011  . HTN (hypertension) 04/09/2011  . DM (diabetes mellitus) ([place]) 04/09/2011   Amy T Tomasita Morrow, OTR/L, CLT Lovett,Amy 03/17/2015, 3:43 PM  [place] MAIN [facility] SERVICES [address] [place], [place], [address] Phone: [phone]   Fax:  [phone]  Name: Daniel [name] MRN: [id number] Date of Birth: July 09, 1955

## 2015-03-21 ENCOUNTER — Ambulatory Visit: Payer: No Typology Code available for payment source | Admitting: Speech Pathology

## 2015-03-21 ENCOUNTER — Encounter: Payer: Self-pay | Admitting: Speech Pathology

## 2015-03-21 ENCOUNTER — Ambulatory Visit: Payer: No Typology Code available for payment source

## 2015-03-21 ENCOUNTER — Ambulatory Visit: Payer: No Typology Code available for payment source | Admitting: Occupational Therapy

## 2015-03-21 DIAGNOSIS — R41841 Cognitive communication deficit: Secondary | ICD-10-CM

## 2015-03-21 DIAGNOSIS — R531 Weakness: Secondary | ICD-10-CM

## 2015-03-21 DIAGNOSIS — R2681 Unsteadiness on feet: Secondary | ICD-10-CM

## 2015-03-21 DIAGNOSIS — M6281 Muscle weakness (generalized): Secondary | ICD-10-CM

## 2015-03-21 NOTE — Therapy (Signed)
[place] MAIN [facility] SERVICES [address] [place], [place], [address] Phone: [phone]   Fax:  [phone]  Speech Language Pathology Treatment  Patient Details  Name: Daniel [name] MRN: [id number] Date of Birth: 03-14-1956 No Data Recorded  Encounter Date: 03/21/2015      End of Session - 03/21/15 1637    Visit Number 8   Number of Visits 17   Date for SLP Re-Evaluation 04/24/15   SLP Start Time 22   SLP Stop Time  1450   SLP Time Calculation (min) 50 min   Activity Tolerance Patient tolerated treatment well      Past Medical History  Diagnosis Date  . Hypertension   . Pancreatitis   . Diabetes mellitus without complication [facility])     Past Surgical History  Procedure Laterality Date  . Tee without cardioversion N/A 11/01/2014    Procedure: TRANSESOPHAGEAL ECHOCARDIOGRAM (TEE);  Surgeon: Wellington Hampshire, MD;  Location: [facility] ORS;  Service: Cardiovascular;  Laterality: N/A;    There were no vitals filed for this visit.  Visit Diagnosis: No diagnosis found.      Subjective Assessment - 03/21/15 1636    Subjective Patient reports that his main concern is still "thinking"   Patient is accompained by: Family member   Pain Onset More than a month ago               ADULT SLP TREATMENT - 03/21/15 0001    General Information   Behavior/Cognition Alert;Cooperative;Impulsive;Distractible;Requires cueing;Decreased sustained attention;Confused   Treatment Provided   Treatment provided Cognitive-Linquistic   Pain Assessment   Pain Assessment No/denies pain   Cognitive-Linquistic Treatment   Treatment focused on Cognition   Skilled Treatment Answered clinician qs about monetary concepts with 75% accuracy, having more difficulty with problems involving multiple steps or multiplication/division. Completed time worksheet with 50% accuracy requiring mod-max cueing.   Assessment / Recommendations / Plan   Plan Continue with  current plan of care   Progression Toward Goals   Progression toward goals Progressing toward goals          SLP Education - 03/21/15 1636    Education provided Yes   Education Details Using strategies to tell time; purpose of "tests"   Person(s) Educated Patient   Methods Explanation   Comprehension Verbalized understanding            SLP Long Term Goals - 02/22/15 1222    SLP LONG TERM GOAL #1   Title Patient will demonstrate functional cognitive-communication skills for independent completion of personal responsibilities given mod support.   Time 8   Period Weeks   Status New   SLP LONG TERM GOAL #2   Title Patient will complete complex attention tasks with 80% accuracy.   Time 8   Period Weeks   Status New   SLP LONG TERM GOAL #3   Title Patient will complete complex executive function skills tasks with 80% accuracy.   Time 8   Period Weeks   Status New   SLP LONG TERM GOAL #4   Title Patient will complete complex visual-spatial activities with 80% accuracy.   Time 8   Period Weeks   Status New          Plan - 03/21/15 1637    Clinical Impression Statement The patient had large difficulty reading longer questions, but could answer simple ones when read aloud, demonstrating some basic auditory comprehension skills. He struggled with sustaining attention for greater  than five minute bursts.   Speech Therapy Frequency 2x / week   Duration Other (comment)   Potential to Achieve Goals Fair   Potential Considerations Ability to learn/carryover information;Cooperation/participation level;Medical prognosis;Previous level of function;Severity of impairments;Family/community support   SLP Home Exercise Plan To be determined   Consulted and Agree with Plan of Care Patient        Problem List Patient Active Problem List   Diagnosis Date Noted  . Stroke ([place]) 10/27/2014  . Lymphadenitis 04/09/2011  . HTN (hypertension) 04/09/2011  . DM (diabetes mellitus)  ([place]) 04/09/2011    Daniel [name] 03/21/2015, 4:40 PM  [place] MAIN [facility] SERVICES [address] [place], [place], [address] Phone: [phone]   Fax:  [phone]   Name: Daniel [name] MRN: [id number] Date of Birth: 06/21/1955

## 2015-03-21 NOTE — Therapy (Signed)
[place] MAIN [facility] SERVICES [address] [place], [place], [address] Phone: [phone]   Fax:  [phone]  Physical Therapy Treatment  Patient Details  Name: Daniel [name] MRN: [id number] Date of Birth: 08-May-1956 Referring Provider: Nicky Pugh   Encounter Date: 03/21/2015      PT End of Session - 03/21/15 1700    Visit Number 12   Number of Visits 17   Date for PT Re-Evaluation 05/09/15   Authorization Type 3/10 g codes   PT Start Time 1600   PT Stop Time 1640   PT Time Calculation (min) 40 min   Equipment Utilized During Treatment Gait belt   Activity Tolerance Patient tolerated treatment well   Behavior During Therapy [facility] for tasks assessed/performed      Past Medical History  Diagnosis Date  . Hypertension   . Pancreatitis   . Diabetes mellitus without complication [facility])     Past Surgical History  Procedure Laterality Date  . Tee without cardioversion N/A 11/01/2014    Procedure: TRANSESOPHAGEAL ECHOCARDIOGRAM (TEE);  Surgeon: Wellington Hampshire, MD;  Location: [facility] ORS;  Service: Cardiovascular;  Laterality: N/A;    There were no vitals filed for this visit.  Visit Diagnosis:  Weakness  Unsteadiness on feet      Subjective Assessment - 03/21/15 1605    Subjective pt reports he is doing well. he reports he has on days and off days, but in general is able to navigate his world better.    Patient Stated Goals pt would like to get better use of the LLE and improve his balance.    Pain Score --  2/10 shoulder pain   Pain Onset More than a month ago      Therex: Nustep LE only L0 x 15min no charge warm up Sit to stand x 10- RLE elevated on box, table slightly elevated-  Fwd step up 1UE, LLE only x 15 LAQ 5s hold x 10 cues for dorsiflexion  NMR:  Alt Toe taps on step- no UE 2x10  Navigating weaving cones with quad cane  Both close and far- cues for larger steps , CGA for safety 23m x 6 Fwd step over/back ,  step back /fwd step side and back over small obstacle x 10 each , each leg Side stepping cues for weight shift- no UE  In // bars x 4 laps Pt requires CGA for safety, Pt requires min verbal and tactile cues for proper exercise performance                                    PT Long Term Goals - 03/15/15 9741    PT LONG TERM GOAL #1   Title pt will improve berg balance score to >46 to demonstrate lower fall risk.    Baseline 34/56 on eval; 43/56 on 10/17   Time 8   Period Weeks   Status On-going   PT LONG TERM GOAL #2   Title pt will ambulate at 0.103m/s with LRAD to appropriately navigate home distances.    Baseline 0.54 m/s on 10/17   Time 8   Period Weeks   Status On-going   PT LONG TERM GOAL #3   Title pt will preform 5xsit to stand in 15 s demonstrating improve LE strength.    Baseline 28 sec on 10/17   Time 8   Period Weeks  Status On-going   PT LONG TERM GOAL #5   Title pt will be able to safely ambulate x 536ft using LRAD to negotiate short community distances.    Baseline 485 ft in 6 min on 10/17   Time 8   Period Weeks   Status On-going               Plan - 03/21/15 1701    Clinical Impression Statement pt demonstrated improved ability to navigate his environment today needing little cues to avoid obtacles. pt did well with progression of LLE functional strengtheing including a sit to stand wiht RLE elevated placing more emphasis on use of the LLE. pt demonstrates early full LAQ today. pt had improved heel strike after LAQ with 5s holds. pt required rest breaks x 3 today, but better endurance overall.    Pt will benefit from skilled therapeutic intervention in order to improve on the following deficits Decreased balance;Decreased activity tolerance;Decreased endurance;Decreased mobility;Decreased strength;Increased edema;Decreased safety awareness;Decreased coordination;Difficulty walking;Decreased knowledge of use of DME   Rehab Potential  Good   PT Frequency 2x / week   PT Duration 8 weeks   PT Treatment/Interventions ADLs/Self Care Home Management;Electrical Stimulation;Therapeutic exercise;Therapeutic activities;Functional mobility training;Stair training;Gait training;DME Instruction;Balance training;Neuromuscular re-education;Manual techniques;Splinting;Passive range of motion;Visual/perceptual remediation/compensation   PT Next Visit Plan progress HEP   Consulted and Agree with Plan of Care Patient        Problem List Patient Active Problem List   Diagnosis Date Noted  . Stroke ([place]) 10/27/2014  . Lymphadenitis 04/09/2011  . HTN (hypertension) 04/09/2011  . DM (diabetes mellitus) ([place]) 04/09/2011   Gorden Harms. Avion Patella, PT, DPT [phone]  Alezandra Egli 03/21/2015, 5:04 PM  [place] MAIN [facility] SERVICES [address] [place], [place], [address] Phone: [phone]   Fax:  [phone]  Name: Daniel [name] MRN: [id number] Date of Birth: 02-23-1956

## 2015-03-21 NOTE — Patient Instructions (Signed)
Reinforced home positioning

## 2015-03-21 NOTE — Therapy (Signed)
[place] MAIN [facility] SERVICES [address] [place], [place], [address] Phone: [phone]   Fax:  [phone]  Occupational Therapy Treatment  Patient Details  Name: Daniel [name] MRN: [id number] Date of Birth: 1955-06-12 No Data Recorded  Encounter Date: 03/21/2015      OT End of Session - 03/21/15 1527    Visit Number 13   Number of Visits 24   Date for OT Re-Evaluation 05/02/15   OT Start Time 1500   OT Stop Time 1545   OT Time Calculation (min) 45 min      Past Medical History  Diagnosis Date  . Hypertension   . Pancreatitis   . Diabetes mellitus without complication [facility])     Past Surgical History  Procedure Laterality Date  . Tee without cardioversion N/A 11/01/2014    Procedure: TRANSESOPHAGEAL ECHOCARDIOGRAM (TEE);  Surgeon: Wellington Hampshire, MD;  Location: [facility] ORS;  Service: Cardiovascular;  Laterality: N/A;    There were no vitals filed for this visit.  Visit Diagnosis:  Weakness  Muscle weakness of left upper extremity      Subjective Assessment - 03/21/15 1526    Subjective  Reported arm feeling better, but seemed bad today   Pertinent History 59 year old male who suffered a CVA on October 28, 2014.   Limitations L arm use.    Shoulder, wrist and finger mobilization. The rest of the session was slow stretch into reflex inhibiting pattern. Much pain in bicep tendon at shoulder. Unable to get to a complete reflex inhibiting pattern today.                               OT Long Term Goals - 03/09/15 1439    OT LONG TERM GOAL #1   Title Patient will increase L UE function or compensate to be able to open jars and bottles.   Baseline can now open bottles but not jars   Time 12   Period Weeks   Status Partially Met   OT LONG TERM GOAL #2   Title Will be able to cut his his lawn.   Baseline Unable to cut his grass at this time   Time 12   Period Weeks   Status On-going   OT LONG TERM  GOAL #3   Title will be able to cut his food with or with out assistive devices.   Baseline Unable to cut his food   Time 12   Period Weeks   Status On-going   OT LONG TERM GOAL #4   Title Will improve enought to possibly return to work.   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   Time 12   Status Achieved   OT LONG TERM GOAL #6   Title Will be able to tie shoes eather with one handed technique or 2 handed.   Baseline now ties with one handed technique   Time 12   Status Achieved   OT LONG TERM GOAL #7   Title Will control L hand edema to be equil to the right in size.   Baseline hands measure equil   Time 12   Period Weeks   Status Achieved  OT LONG TERM GOAL #8   Title Will increase L UE finger range of motion    Baseline fingers are contracte in flexion.(40%)   Time 12   Period Weeks   Status On-going               Plan - 03/21/15 1528    Clinical Impression Statement shoulder pain continues with motion away from reflex inhibiting pattern.   Pt will benefit from skilled therapeutic intervention in order to improve on the following deficits (Retired) Abnormal gait;Decreased activity tolerance;Decreased balance;Decreased coordination;Decreased endurance;Decreased knowledge of use of DME;Decreased mobility;Decreased range of motion;Decreased strength;Difficulty walking;Increased edema;Impaired flexibility;Impaired sensation;Impaired tone;Impaired UE functional use   OT Treatment/Interventions Self-care/ADL training;Electrical Stimulation;DME and/or AE instruction;Energy conservation;Neuromuscular education;Therapeutic exercise;Functional Mobility Training;Manual Therapy;Passive range of motion;Splinting;Therapeutic exercises;Therapeutic activities        Problem List Patient Active Problem List   Diagnosis Date  Noted  . Stroke ([place]) 10/27/2014  . Lymphadenitis 04/09/2011  . HTN (hypertension) 04/09/2011  . DM (diabetes mellitus) ([place]) 04/09/2011   [name], MS/OTR/L  [name] 03/21/2015, 3:30 PM  [place] MAIN [facility] SERVICES [address] [place], [place], [address] Phone: [phone]   Fax:  [phone]  Name: ZACCHEAUS STORLIE MRN: [id number] Date of Birth: [birth year]

## 2015-03-23 ENCOUNTER — Encounter: Payer: Self-pay | Admitting: Occupational Therapy

## 2015-03-23 ENCOUNTER — Ambulatory Visit: Payer: No Typology Code available for payment source | Admitting: Speech Pathology

## 2015-03-23 ENCOUNTER — Ambulatory Visit: Payer: No Typology Code available for payment source

## 2015-03-23 ENCOUNTER — Ambulatory Visit: Payer: No Typology Code available for payment source | Admitting: Occupational Therapy

## 2015-03-23 DIAGNOSIS — M6281 Muscle weakness (generalized): Secondary | ICD-10-CM

## 2015-03-23 DIAGNOSIS — R531 Weakness: Secondary | ICD-10-CM | POA: Diagnosis not present

## 2015-03-23 DIAGNOSIS — R41841 Cognitive communication deficit: Secondary | ICD-10-CM

## 2015-03-23 DIAGNOSIS — R2681 Unsteadiness on feet: Secondary | ICD-10-CM

## 2015-03-23 NOTE — Patient Instructions (Signed)
Instructed that he needs to move his arm more at home

## 2015-03-23 NOTE — Therapy (Signed)
[place] MAIN [facility] SERVICES [address] [place], [place], [address] Phone: [phone]   Fax:  [phone]  Occupational Therapy Treatment  Patient Details  Name: Daniel [name] MRN: [id number] Date of Birth: May 23, 1956 No Data Recorded  Encounter Date: 03/23/2015      OT End of Session - 03/23/15 1555    Visit Number 14   Number of Visits 24   Date for OT Re-Evaluation 05/02/15   OT Start Time 1500   OT Stop Time 1545   OT Time Calculation (min) 45 min      Past Medical History  Diagnosis Date  . Hypertension   . Pancreatitis   . Diabetes mellitus without complication [facility]     Past Surgical History  Procedure Laterality Date  . Tee without cardioversion N/A 11/01/2014    Procedure: TRANSESOPHAGEAL ECHOCARDIOGRAM (TEE);  Surgeon: Wellington Hampshire, MD;  Location: [facility] ORS;  Service: Cardiovascular;  Laterality: N/A;    There were no vitals filed for this visit.  Visit Diagnosis:  Muscle weakness of left upper extremity      Subjective Assessment - 03/23/15 1553    Subjective  Reports pain in shouder with movement   Pertinent History 59 year old male who suffered a CVA on October 28, 2014.     mobilization of scapula, shoulder, wrist and fingers. Slow stretching in reflex inhibiting pattern in supine.needed to be very slow as much pain. Part way through, placed hot pack on left shoulder and continued stretching. Heat did help some.   Patient showed me his self stretches at home and informed him that it is not enough and encouraged him to do more.                         OT Education - 03/23/15 1554    Education provided Yes   Education Details why important to move arm at home   Person(s) Educated Patient   Methods Explanation   Comprehension Verbalized understanding             OT Long Term Goals - 03/09/15 1439    OT LONG TERM GOAL #1   Title Patient will increase L UE function or compensate  to be able to open jars and bottles.   Baseline can now open bottles but not jars   Time 12   Period Weeks   Status Partially Met   OT LONG TERM GOAL #2   Title Will be able to cut his his lawn.   Baseline Unable to cut his grass at this time   Time 12   Period Weeks   Status On-going   OT LONG TERM GOAL #3   Title will be able to cut his food with or with out assistive devices.   Baseline Unable to cut his food   Time 12   Period Weeks   Status On-going   OT LONG TERM GOAL #4   Title Will improve enought to possibly return to work.   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   Time 12   Status Achieved   OT LONG TERM GOAL #6   Title Will be able to tie shoes eather with one handed technique or 2  handed.   Baseline now ties with one handed technique   Time 12   Status Achieved   OT LONG TERM GOAL #7   Title Will control L hand edema to be equil to the right in size.   Baseline hands measure equil   Time 12   Period Weeks   Status Achieved   OT LONG TERM GOAL #8   Title Will increase L UE finger range of motion    Baseline fingers are contracte in flexion.(40%)   Time 12   Period Weeks   Status On-going               Plan - 03/23/15 1555    Clinical Impression Statement Patient shoulder continues to be painful with movement away from the body.   Pt will benefit from skilled therapeutic intervention in order to improve on the following deficits (Retired) Abnormal gait;Decreased activity tolerance;Decreased balance;Decreased coordination;Decreased endurance;Decreased knowledge of use of DME;Decreased mobility;Decreased range of motion;Decreased strength;Difficulty walking;Increased edema;Impaired flexibility;Impaired sensation;Impaired tone;Impaired UE functional use   OT Treatment/Interventions Self-care/ADL  training;Electrical Stimulation;DME and/or AE instruction;Energy conservation;Neuromuscular education;Therapeutic exercise;Functional Mobility Training;Manual Therapy;Passive range of motion;Splinting;Therapeutic exercises;Therapeutic activities        Problem List Patient Active Problem List   Diagnosis Date Noted  . Stroke ([place]) 10/27/2014  . Lymphadenitis 04/09/2011  . HTN (hypertension) 04/09/2011  . DM (diabetes mellitus) ([place]) 04/09/2011   [name], MS/OTR/L  [name] 03/23/2015, 3:58 PM  [place] MAIN [facility] SERVICES [address] [place], [place], [address] Phone: [phone]   Fax:  [phone]  Name: Daniel [name] MRN: [id number] Date of Birth: [birth year]

## 2015-03-24 ENCOUNTER — Encounter: Payer: Self-pay | Admitting: Speech Pathology

## 2015-03-24 NOTE — Therapy (Signed)
[place] MAIN [facility] SERVICES [address] [place], [place], [address] Phone: [phone]   Fax:  [phone]  Speech Language Pathology Treatment  Patient Details  Name: Daniel [name] MRN: [id number] Date of Birth: [birth year] No Data Recorded  Encounter Date: 03/23/2015      End of Session - 03/24/15 0830    Visit Number 9   Number of Visits 17   Date for SLP Re-Evaluation 04/24/15   SLP Start Time 85   SLP Stop Time  1455   SLP Time Calculation (min) 55 min   Activity Tolerance Patient tolerated treatment well      Past Medical History  Diagnosis Date  . Hypertension   . Pancreatitis   . Diabetes mellitus without complication [name]     Past Surgical History  Procedure Laterality Date  . Tee without cardioversion N/A 11/01/2014    Procedure: TRANSESOPHAGEAL ECHOCARDIOGRAM (TEE);  Surgeon: Wellington Hampshire, MD;  Location: [facility] ORS;  Service: Cardiovascular;  Laterality: N/A;    There were no vitals filed for this visit.  Visit Diagnosis: Cognitive communication deficit      Subjective Assessment - 03/24/15 0828    Subjective Patient reports that sometimes he get "paranoid" around people.    Currently in Pain? No/denies           SLP Evaluation [facility] - 03/23/15 1500    SLP Visit Information   SLP Received On 03/23/15            ADULT SLP TREATMENT - 03/23/15 1400    General Information   Behavior/Cognition Alert;Cooperative;Impulsive;Distractible;Requires cueing;Decreased sustained attention;Confused   Treatment Provided   Treatment provided Cognitive-Linquistic   Pain Assessment   Pain Assessment No/denies pain   Cognitive-Linquistic Treatment   Treatment focused on Cognition   Skilled Treatment The pt responded to described situations by describing possible cause with 80% acc. Pt responded to questions re: measurements with 40% acc, improved to 90% w/mod cues from SLP. Pt responded to questions re:  quantity with 80% acc. Pt responded to questions re: time using analog drawings of clock with 85% acc. Pt able to sustain attention in 3 out of 4 tasks.   Assessment / Recommendations / Plan   Plan Continue with current plan of care   Progression Toward Goals   Progression toward goals Progressing toward goals          SLP Education - 03/24/15 0829    Education provided Yes   Education Details Importance of practicing cognitive tasks   Person(s) Educated Patient   Methods Explanation   Comprehension Verbalized understanding            SLP Long Term Goals - 02/22/15 1222    SLP LONG TERM GOAL #1   Title Patient will demonstrate functional cognitive-communication skills for independent completion of personal responsibilities given mod support.   Time 8   Period Weeks   Status New   SLP LONG TERM GOAL #2   Title Patient will complete complex attention tasks with 80% accuracy.   Time 8   Period Weeks   Status New   SLP LONG TERM GOAL #3   Title Patient will complete complex executive function skills tasks with 80% accuracy.   Time 8   Period Weeks   Status New   SLP LONG TERM GOAL #4   Title Patient will complete complex visual-spatial activities with 80% accuracy.   Time 8   Period Weeks   Status New  Plan - 03/24/15 0830    Clinical Impression Statement Pt demonstrated improvement in completing quantity and time concept tasks. Improved attention today in most tasks.   Speech Therapy Frequency 2x / week   Duration Other (comment)  8 weeks   Potential to Achieve Goals Fair   Potential Considerations Ability to learn/carryover information;Cooperation/participation level;Medical prognosis;Previous level of function;Severity of impairments;Family/community support   Consulted and Agree with Plan of Care Patient        Problem List Patient Active Problem List   Diagnosis Date Noted  . Stroke ([name]) 10/27/2014  . Lymphadenitis 04/09/2011  . HTN  (hypertension) 04/09/2011  . DM (diabetes mellitus) ([name]) 04/09/2011    [name],[name] 03/24/2015, 8:33 AM  [place] MAIN [facility] SERVICES [address] [place], [place], [address] Phone: [phone]   Fax:  [phone]   Name: Daniel [name] MRN: [id number] Date of Birth: Dec 09, 1955

## 2015-03-24 NOTE — Therapy (Signed)
[place] MAIN [facility] SERVICES [address] [place], [place], [address] Phone: [phone]   Fax:  [phone]  Physical Therapy Treatment  Patient Details  Name: Daniel [name] MRN: [id number] Date of Birth: Dec 09, 1955 Referring Provider: Nicky Pugh   Encounter Date: 03/23/2015      PT End of Session - 03/24/15 0913    Visit Number 13   Number of Visits 17   Date for PT Re-Evaluation 05/09/15   Authorization Type 4/10 g codes   PT Start Time 1600   PT Stop Time 5003   PT Time Calculation (min) 45 min   Equipment Utilized During Treatment Gait belt   Activity Tolerance Patient tolerated treatment well   Behavior During Therapy [facility] for tasks assessed/performed      Past Medical History  Diagnosis Date  . Hypertension   . Pancreatitis   . Diabetes mellitus without complication [facility])     Past Surgical History  Procedure Laterality Date  . Tee without cardioversion N/A 11/01/2014    Procedure: TRANSESOPHAGEAL ECHOCARDIOGRAM (TEE);  Surgeon: Wellington Hampshire, MD;  Location: [facility] ORS;  Service: Cardiovascular;  Laterality: N/A;    There were no vitals filed for this visit.  Visit Diagnosis:  Weakness  Unsteadiness on feet      Subjective Assessment - 03/24/15 0910    Subjective Pt relates he has had a long day today but is looking forward to working on his L LE.  pt denies any pain currently. pt has the sensation of having his eyes closed with extended standing.      Patient Stated Goals pt would like to get better use of the LLE and improve his balance.    Currently in Pain? No/denies   Pain Score 0-No pain   Pain Onset More than a month ago      Neuro re-ed Nustep level 4 x 5 min: no charge Forward walking in //bars x 1 lap with instruction to ambulate with heel toe gait pattern Side stepping in //bars x2 laps with verbal and tactile cueing to maintain his hips and torso facing forward versus in the direction of  movement, especially side stepping to the left Toe taps on 6 inch step 3x10 each LE, with instruction to perform activity as quickly as possible, pt required CGA when performing toe taps with R LE due to decreased stability on L LE Sit to stand x5 with L LE on AIREX and close supervision for safety Step ups and eccentric step downs from AIREX with L LE 2x10, pt required min A during one episode of LOB but demonstrated stepping reaction Forward/backwards/bilateral side stepping reactions x3 each side, pt demonstrates appropriate but delayed stepping reactions, especially with lateral leans Stepping forward and back x10 each LE, pt required cueing for weight shifts  Side step and back x10 each LE, pt required cueing for weight shifts  Pt ambulated x60 ft while performing vertical and horizontal head turns, pt's gait speed decreased with head turns Pt ambulated x60 ft without an assistive device while performing horizontal head turns, and pt's gait speed decreased with head turns, he also maintains his L knee flexed even with instruction to increase knee extension.                           PT Education - 03/24/15 0912    Education provided Yes   Education Details importance of improving stepping reaction  Person(s) Educated Patient   Methods Explanation   Comprehension Verbalized understanding             PT Long Term Goals - 03/15/15 7622    PT LONG TERM GOAL #1   Title pt will improve berg balance score to >46 to demonstrate lower fall risk.    Baseline 34/56 on eval; 43/56 on 10/17   Time 8   Period Weeks   Status On-going   PT LONG TERM GOAL #2   Title pt will ambulate at 0.85m/s with LRAD to appropriately navigate home distances.    Baseline 0.54 m/s on 10/17   Time 8   Period Weeks   Status On-going   PT LONG TERM GOAL #3   Title pt will preform 5xsit to stand in 15 s demonstrating improve LE strength.    Baseline 28 sec on 10/17   Time 8   Period  Weeks   Status On-going   PT LONG TERM GOAL #5   Title pt will be able to safely ambulate x 535ft using LRAD to negotiate short community distances.    Baseline 485 ft in 6 min on 10/17   Time 8   Period Weeks   Status On-going               Plan - 03/24/15 0913    Clinical Impression Statement Pt was able to perform exercises and maneuver around the gym with increased speed, which improved the quantity of exercise performed today.  Pt required occasional cues to avoid bumping into things on his left side from left sided neglect.  Pt presents with improved LE control throughout session and will benefit from continued skilled PT services to improve deficits to decrease risk of falls and improve mobility.     Pt will benefit from skilled therapeutic intervention in order to improve on the following deficits Decreased balance;Decreased activity tolerance;Decreased endurance;Decreased mobility;Decreased strength;Increased edema;Decreased safety awareness;Decreased coordination;Difficulty walking;Decreased knowledge of use of DME   Rehab Potential Good   PT Frequency 2x / week   PT Duration 8 weeks   PT Treatment/Interventions ADLs/Self Care Home Management;Electrical Stimulation;Therapeutic exercise;Therapeutic activities;Functional mobility training;Stair training;Gait training;DME Instruction;Balance training;Neuromuscular re-education;Manual techniques;Splinting;Passive range of motion;Visual/perceptual remediation/compensation   PT Next Visit Plan progress HEP   Consulted and Agree with Plan of Care Patient        Problem List Patient Active Problem List   Diagnosis Date Noted  . Stroke ([place]) 10/27/2014  . Lymphadenitis 04/09/2011  . HTN (hypertension) 04/09/2011  . DM (diabetes mellitus) ([place]) 04/09/2011   [name], SPT This entire session was performed under direct supervision and direction of a licensed therapist/therapist assistant . I have personally read, edited and  approve of the note as written. Gorden Harms. Tortorici, PT, DPT [phone]  Tortorici,Ashley 03/24/2015, 2:00 PM  [place] MAIN [facility] SERVICES [address] [place], [place], [address] Phone: [phone]   Fax:  [phone]  Name: TREVAR BOEHRINGER MRN: [id number] Date of Birth: 08/26/1955

## 2015-03-28 ENCOUNTER — Encounter: Payer: Self-pay | Admitting: Occupational Therapy

## 2015-03-28 ENCOUNTER — Encounter: Payer: Self-pay | Admitting: Speech Pathology

## 2015-03-28 ENCOUNTER — Ambulatory Visit: Payer: No Typology Code available for payment source | Admitting: Speech Pathology

## 2015-03-28 ENCOUNTER — Ambulatory Visit: Payer: No Typology Code available for payment source

## 2015-03-28 ENCOUNTER — Ambulatory Visit: Payer: No Typology Code available for payment source | Admitting: Occupational Therapy

## 2015-03-28 DIAGNOSIS — R531 Weakness: Secondary | ICD-10-CM | POA: Diagnosis not present

## 2015-03-28 DIAGNOSIS — R41841 Cognitive communication deficit: Secondary | ICD-10-CM

## 2015-03-28 DIAGNOSIS — R2681 Unsteadiness on feet: Secondary | ICD-10-CM

## 2015-03-28 DIAGNOSIS — M6281 Muscle weakness (generalized): Secondary | ICD-10-CM

## 2015-03-28 NOTE — Therapy (Signed)
[place] MAIN [facility] SERVICES [address] [place], [place], [address] Phone: [phone]   Fax:  [phone]  Speech Language Pathology Treatment  Patient Details  Name: Daniel [name] MRN: [id number] Date of Birth: 09/14/1955 No Data Recorded  Encounter Date: 03/28/2015      End of Session - 03/28/15 1616    Visit Number 10   Number of Visits 17   Date for SLP Re-Evaluation 04/24/15   SLP Start Time 1400   SLP Stop Time  1455   SLP Time Calculation (min) 55 min   Activity Tolerance Patient tolerated treatment well      Past Medical History  Diagnosis Date  . Hypertension   . Pancreatitis   . Diabetes mellitus without complication [facility])     Past Surgical History  Procedure Laterality Date  . Tee without cardioversion N/A 11/01/2014    Procedure: TRANSESOPHAGEAL ECHOCARDIOGRAM (TEE);  Surgeon: Wellington Hampshire, MD;  Location: [facility] ORS;  Service: Cardiovascular;  Laterality: N/A;    There were no vitals filed for this visit.  Visit Diagnosis: No diagnosis found.      Subjective Assessment - 03/28/15 1613    Subjective The patient reports that tasks that are difficult for others are easy for him, while tasks that come easily to others are difficult for him-prior to onset of CVA, but not helped by it.   Patient is accompained by: Family member   Pain Score 8    Pain Location Shoulder   Pain Orientation Left   Pain Descriptors / Indicators Aching;Numbness   Pain Type Chronic pain   Pain Onset More than a month ago   Pain Frequency Intermittent   Multiple Pain Sites No               ADULT SLP TREATMENT - 03/28/15 0001    General Information   Behavior/Cognition Alert;Cooperative;Impulsive;Distractible;Requires cueing;Decreased sustained attention;Confused   Treatment Provided   Treatment provided Cognitive-Linquistic   Pain Assessment   Pain Assessment No/denies pain   Cognitive-Linquistic Treatment   Treatment focused on Cognition   Skilled Treatment EXECUTIVE FUNCTION: Wrote answer to clinician time and space concept yes-no questions with 66% accuracy and independence, increasing to 95% accuracy with mod-max cueing. Used calendar to answer questions about dates and days of the week with 50% accuracy and independence, increasing to 100% accuracy with mod cueing. ATTENTION: The patient exhibited minimal difficulty alternating attention between these two tasks when requested to set aside previous activity and resume other. Some delay noted. 2 redirects needed for sustained attention during individual tasks; when task got too difficult at 35 minutes into the session, client began talking about other subjects.   Assessment / Recommendations / Plan   Plan Continue with current plan of care   Progression Toward Goals   Progression toward goals Progressing toward goals          SLP Education - 03/28/15 1615    Education provided Yes   Education Details Self-advocacy strategies when memory or attention makes answering personal ID questions difficult   Person(s) Educated Patient   Methods Explanation   Comprehension Verbalized understanding            SLP Long Term Goals - 02/22/15 1222    SLP LONG TERM GOAL #1   Title Patient will demonstrate functional cognitive-communication skills for independent completion of personal responsibilities given mod support.   Time 8   Period Weeks   Status New   SLP LONG  TERM GOAL #2   Title Patient will complete complex attention tasks with 80% accuracy.   Time 8   Period Weeks   Status New   SLP LONG TERM GOAL #3   Title Patient will complete complex executive function skills tasks with 80% accuracy.   Time 8   Period Weeks   Status New   SLP LONG TERM GOAL #4   Title Patient will complete complex visual-spatial activities with 80% accuracy.   Time 8   Period Weeks   Status New          Plan - 03/28/15 1616    Clinical Impression  Statement The patient exhibits good sustained attention when performing lower-level cognitive tasks, but still struggles with higher level cognitive tasks. Patient has difficulty understanding purpose/importance of using compensatory strategies due to some lack of awareness of deficits. Patient will continue to benefit from cognitive-communication therapy at this time.   Speech Therapy Frequency 2x / week   Duration Other (comment)  8 weeks   Potential to Achieve Goals Fair   Potential Considerations Ability to learn/carryover information;Cooperation/participation level;Medical prognosis;Previous level of function;Severity of impairments;Family/community support   Consulted and Agree with Plan of Care Patient        Problem List Patient Active Problem List   Diagnosis Date Noted  . Stroke ([place]) 10/27/2014  . Lymphadenitis 04/09/2011  . HTN (hypertension) 04/09/2011  . DM (diabetes mellitus) ([place]) 04/09/2011    [name] 03/28/2015, 4:17 PM  [place] MAIN [facility] SERVICES [address] [place], [place], [address] Phone: [phone]   Fax:  [phone]   Name: Daniel [name] MRN: [id number] Date of Birth: 12/02/1955

## 2015-03-28 NOTE — Therapy (Signed)
[name] MAIN [facility] SERVICES [address] [place], [place], [address] Phone: [phone]   Fax:  [phone]  Occupational Therapy Treatment  Patient Details  Name: Daniel [name] MRN: [id number] Date of Birth: [birth year] No Data Recorded  Encounter Date: 03/28/2015      OT End of Session - 03/28/15 1455    Visit Number 15   Number of Visits 24   Date for OT Re-Evaluation 05/02/15   OT Start Time 1415   OT Stop Time 1500   OT Time Calculation (min) 45 min      Past Medical History  Diagnosis Date  . Hypertension   . Pancreatitis   . Diabetes mellitus without complication [facility])     Past Surgical History  Procedure Laterality Date  . Tee without cardioversion N/A 11/01/2014    Procedure: TRANSESOPHAGEAL ECHOCARDIOGRAM (TEE);  Surgeon: Wellington Hampshire, MD;  Location: [facility] ORS;  Service: Cardiovascular;  Laterality: N/A;    There were no vitals filed for this visit.  Visit Diagnosis:  Muscle weakness of left upper extremity      Subjective Assessment - 03/28/15 1451    Subjective  The pain in my shoulder is less when I am on my back.   Pertinent History 59 year old male who suffered a CVA on October 28, 2014.   Pain Score 8    Pain Location Shoulder   Pain Orientation Left   Pain Type Chronic pain   Multiple Pain Sites No    Used heat in front of shoulder then in back while stretching patient in reflex inhibiting pattern. Slow stretch, very painful, unable to get too far. Mobilization to scapula and front of shoulder.                          OT Education - 03/28/15 1453    Education provided Yes   Education Details most likely reason for shoulder pain   Person(s) Educated Patient   Methods Explanation   Comprehension Verbalized understanding             OT Long Term Goals - 03/09/15 1439    OT LONG TERM GOAL #1   Title Patient will increase L UE function or compensate to be able to open jars  and bottles.   Baseline can now open bottles but not jars   Time 12   Period Weeks   Status Partially Met   OT LONG TERM GOAL #2   Title Will be able to cut his his lawn.   Baseline Unable to cut his grass at this time   Time 12   Period Weeks   Status On-going   OT LONG TERM GOAL #3   Title will be able to cut his food with or with out assistive devices.   Baseline Unable to cut his food   Time 12   Period Weeks   Status On-going   OT LONG TERM GOAL #4   Title Will improve enought to possibly return to work.   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   Time 12   Status Achieved   OT LONG TERM GOAL #6   Title Will be able to tie shoes eather with one  handed technique or 2 handed.   Baseline now ties with one handed technique   Time 12   Status Achieved   OT LONG TERM GOAL #7   Title Will control L hand edema to be equil to the right in size.   Baseline hands measure equil   Time 12   Period Weeks   Status Achieved   OT LONG TERM GOAL #8   Title Will increase L UE finger range of motion    Baseline fingers are contracte in flexion.(40%)   Time 12   Period Weeks   Status On-going               Plan - 03/28/15 1455    Clinical Impression Statement Pain continues in shoulder and is not getting much better.   Pt will benefit from skilled therapeutic intervention in order to improve on the following deficits (Retired) Abnormal gait;Decreased activity tolerance;Decreased balance;Decreased coordination;Decreased endurance;Decreased knowledge of use of DME;Decreased mobility;Decreased range of motion;Decreased strength;Difficulty walking;Increased edema;Impaired flexibility;Impaired sensation;Impaired tone;Impaired UE functional use   OT Treatment/Interventions Self-care/ADL training;Electrical Stimulation;DME and/or AE  instruction;Energy conservation;Neuromuscular education;Therapeutic exercise;Functional Mobility Training;Manual Therapy;Passive range of motion;Splinting;Therapeutic exercises;Therapeutic activities        Problem List Patient Active Problem List   Diagnosis Date Noted  . Stroke ([place]) 10/27/2014  . Lymphadenitis 04/09/2011  . HTN (hypertension) 04/09/2011  . DM (diabetes mellitus) ([place]) 04/09/2011   [name], MS/OTR/L  [name] 03/28/2015, 2:58 PM  [place] MAIN [facility] SERVICES [address] [place], [place], [address] Phone: [phone]   Fax:  [phone]  Name: Daniel [name] MRN: [id number] Date of Birth: 06/07/1955

## 2015-03-28 NOTE — Therapy (Signed)
[place] MAIN [facility] SERVICES [address] [place], [place], [address] Phone: [phone]   Fax:  [phone]  Physical Therapy Treatment  Patient Details  Name: Daniel [name] MRN: [id number] Date of Birth: January 16, 1956 Referring Provider: Nicky Pugh   Encounter Date: 03/28/2015      PT End of Session - 03/28/15 1746    Visit Number 14   Number of Visits 17   Date for PT Re-Evaluation 05/09/15   Authorization Type 5/10 g codes   PT Start Time 1600   PT Stop Time 2820   PT Time Calculation (min) 45 min   Equipment Utilized During Treatment Gait belt   Activity Tolerance Patient tolerated treatment well   Behavior During Therapy [facility] for tasks assessed/performed      Past Medical History  Diagnosis Date  . Hypertension   . Pancreatitis   . Diabetes mellitus without complication [facility])     Past Surgical History  Procedure Laterality Date  . Tee without cardioversion N/A 11/01/2014    Procedure: TRANSESOPHAGEAL ECHOCARDIOGRAM (TEE);  Surgeon: Wellington Hampshire, MD;  Location: [facility] ORS;  Service: Cardiovascular;  Laterality: N/A;    There were no vitals filed for this visit.  Visit Diagnosis:  Unsteadiness on feet  Weakness      Subjective Assessment - 03/28/15 1744    Subjective Pt relates he has not been doing well the past few days and currently has 5/10 pain in his left shoulder.  Pt feels like his left LE is slightly better but occasionally feels like his leg might "give out." Pt denies any falls or near falls since his last visit.     Patient Stated Goals pt would like to get better use of the LLE and improve his balance.    Pain Score 5    Pain Location Shoulder   Pain Orientation Left   Pain Onset More than a month ago        Neuro re-ed Toe taps on 6 inch step 2x10 each LE, pt required CGA when performing toe taps with R LE due to decreased stability on L LE Toe taps from [employer] to 6 inch step 2x10 each LE, pt  required CGA when performing toe taps with R LE due to decreased stability on L LE   There ex:  Nustep level 4 x 5 min: no charge In fitness center: Singe knee extension with 1 plate 3x10 each LE Single knee extension with 2 plates 3x10 each LE Single hamstring curl with 4 plates 3x10 each LE Pt required cueing to either fully extend each LE during knee extension or to increase knee flexion during hamstring curls.  Pt also required CGA and cueing on how to get in and out of the machine.  Pt required extra time to perform exercises                           PT Education - 03/28/15 1745    Education provided Yes   Education Details new balance and strengthe exercises    Person(s) Educated Patient   Methods Explanation   Comprehension Verbalized understanding             PT Long Term Goals - 03/15/15 6015    PT LONG TERM GOAL #1   Title pt will improve berg balance score to >46 to demonstrate lower fall risk.    Baseline 34/56 on eval; 43/56 on 10/17  Time 8   Period Weeks   Status On-going   PT LONG TERM GOAL #2   Title pt will ambulate at 0.1m/s with LRAD to appropriately navigate home distances.    Baseline 0.54 m/s on 10/17   Time 8   Period Weeks   Status On-going   PT LONG TERM GOAL #3   Title pt will preform 5xsit to stand in 15 s demonstrating improve LE strength.    Baseline 28 sec on 10/17   Time 8   Period Weeks   Status On-going   PT LONG TERM GOAL #5   Title pt will be able to safely ambulate x 5101ft using LRAD to negotiate short community distances.    Baseline 485 ft in 6 min on 10/17   Time 8   Period Weeks   Status On-going               Plan - 03/28/15 1749    Clinical Impression Statement pt did really well today and did not experience LOB and required minimal cues to attend to his L side when ambulating around the facility indicating improved left sided neglect.  Pt demonstrates improved quad control with improved  performance with exercises and control during knee extensions and toe taps.     Pt will benefit from skilled therapeutic intervention in order to improve on the following deficits Decreased balance;Decreased activity tolerance;Decreased endurance;Decreased mobility;Decreased strength;Increased edema;Decreased safety awareness;Decreased coordination;Difficulty walking;Decreased knowledge of use of DME   Rehab Potential Good   PT Frequency 2x / week   PT Duration 8 weeks   PT Treatment/Interventions ADLs/Self Care Home Management;Electrical Stimulation;Therapeutic exercise;Therapeutic activities;Functional mobility training;Stair training;Gait training;DME Instruction;Balance training;Neuromuscular re-education;Manual techniques;Splinting;Passive range of motion;Visual/perceptual remediation/compensation   PT Next Visit Plan progress HEP   Consulted and Agree with Plan of Care Patient        Problem List Patient Active Problem List   Diagnosis Date Noted  . Stroke ([place]) 10/27/2014  . Lymphadenitis 04/09/2011  . HTN (hypertension) 04/09/2011  . DM (diabetes mellitus) ([place]) 04/09/2011   Alwyn Ren This entire session was performed under direct supervision and direction of a licensed Chiropractor . I have personally read, edited and approve of the note as written. Gorden Harms. Tortorici, PT, DPT [phone]  Tortorici,Ashley 03/29/2015, 11:14 AM  [place] MAIN [facility] SERVICES [address] [place], [place], [address] Phone: [phone]   Fax:  [phone]  Name: Daniel [name] MRN: [id number] Date of Birth: 1956-01-11

## 2015-03-30 ENCOUNTER — Ambulatory Visit: Payer: No Typology Code available for payment source | Admitting: Speech Pathology

## 2015-03-30 ENCOUNTER — Encounter: Payer: Self-pay | Admitting: Speech Pathology

## 2015-03-30 ENCOUNTER — Ambulatory Visit: Payer: No Typology Code available for payment source

## 2015-03-30 ENCOUNTER — Ambulatory Visit: Payer: No Typology Code available for payment source | Attending: Internal Medicine | Admitting: Occupational Therapy

## 2015-03-30 ENCOUNTER — Encounter: Payer: Self-pay | Admitting: Occupational Therapy

## 2015-03-30 DIAGNOSIS — R2681 Unsteadiness on feet: Secondary | ICD-10-CM | POA: Insufficient documentation

## 2015-03-30 DIAGNOSIS — M6281 Muscle weakness (generalized): Secondary | ICD-10-CM | POA: Diagnosis present

## 2015-03-30 DIAGNOSIS — R531 Weakness: Secondary | ICD-10-CM

## 2015-03-30 DIAGNOSIS — R41841 Cognitive communication deficit: Secondary | ICD-10-CM | POA: Diagnosis present

## 2015-03-30 NOTE — Therapy (Signed)
[place] MAIN [facility] SERVICES [address] [place], [place], [address] Phone: [phone]   Fax:  [phone]  Speech Language Pathology Treatment  Patient Details  Name: Daniel [name] MRN: [id number] Date of Birth: 1956/02/08 No Data Recorded  Encounter Date: 03/30/2015      End of Session - 03/30/15 1654    Visit Number 11   Number of Visits 17   Date for SLP Re-Evaluation 04/24/15   SLP Start Time 1359   SLP Stop Time  1455   SLP Time Calculation (min) 56 min   Activity Tolerance Patient tolerated treatment well      Past Medical History  Diagnosis Date  . Hypertension   . Pancreatitis   . Diabetes mellitus without complication [facility])     Past Surgical History  Procedure Laterality Date  . Tee without cardioversion N/A 11/01/2014    Procedure: TRANSESOPHAGEAL ECHOCARDIOGRAM (TEE);  Surgeon: Wellington Hampshire, MD;  Location: [facility] ORS;  Service: Cardiovascular;  Laterality: N/A;    There were no vitals filed for this visit.  Visit Diagnosis: No diagnosis found.      Subjective Assessment - 03/30/15 1652    Subjective The patient reports being appreciative when he is told an aspect of communication that has improved.   Patient is accompained by: Family member   Pain Onset More than a month ago               ADULT SLP TREATMENT - 03/30/15 0001    General Information   Behavior/Cognition Alert;Cooperative;Impulsive;Distractible;Requires cueing;Decreased sustained attention;Confused   Treatment Provided   Treatment provided Cognitive-Linquistic   Pain Assessment   Pain Assessment No/denies pain   Cognitive-Linquistic Treatment   Treatment focused on Cognition   Skilled Treatment EXECUTIVE FUNCTION: Answered and wrote response to clinician telling time questions with 50% accuracy and independence, increasing to 95% accuracy with min-max cueing. Elapsed time questions required highest level of cueing and utilizing  clock model.  ATTENTION: The patient exhibited minimal difficulty alternating attention between these two tasks when requested to set aside previous activity and resume other. Some delay noted; one min prompt needed to remind him where to resume worksheet. 2 redirects needed for sustained attention during individual visuospatial task; one of these redirects was utilized less because of inattention and more because he thought he was finished with activity but had missed marking a few items. VISUOSPATIAL: Patient completed visual scanning activity by marking out items with 84% accuracy and independence, improving to 100% accuracy with moderate cueing.   Assessment / Recommendations / Plan   Plan Continue with current plan of care   Progression Toward Goals   Progression toward goals Progressing toward goals          SLP Education - 03/30/15 1653    Education provided Yes   Education Details Scanning strategies and utilizing them in real life   Person(s) Educated Patient   Methods Explanation;Demonstration   Comprehension Verbalized understanding;Returned demonstration;Need further instruction            SLP Long Term Goals - 03/30/15 1655    SLP LONG TERM GOAL #1   Title Patient will demonstrate functional cognitive-communication skills for independent completion of personal responsibilities given mod support.   Time 8   Period Weeks   Status Partially Met   SLP LONG TERM GOAL #2   Title Patient will complete complex attention tasks with 80% accuracy.   Time 8   Period Weeks  Status Partially Met   SLP LONG TERM GOAL #3   Title Patient will complete complex executive function skills tasks with 80% accuracy.   Time 8   Period Weeks   Status Partially Met   SLP LONG TERM GOAL #4   Title Patient will complete complex visual-spatial activities with 80% accuracy.   Time 8   Period Weeks   Status Partially Met          Plan - 03/30/15 1654    Clinical Impression Statement  The patient exhibits good sustained attention when performing lower-level cognitive tasks, but still struggles with higher level cognitive tasks. Patient has difficulty understanding purpose/importance of using compensatory strategies due to some lack of awareness of deficits. Patient will continue to benefit from cognitive-communication therapy at this time.   Speech Therapy Frequency 2x / week   Duration Other (comment)  8 weeks   Potential to Achieve Goals Fair   Potential Considerations Ability to learn/carryover information;Cooperation/participation level;Medical prognosis;Previous level of function;Severity of impairments;Family/community support   Consulted and Agree with Plan of Care Patient        Problem List Patient Active Problem List   Diagnosis Date Noted  . Stroke ([place]) 10/27/2014  . Lymphadenitis 04/09/2011  . HTN (hypertension) 04/09/2011  . DM (diabetes mellitus) ([place]) 04/09/2011    Daniel [name] 03/30/2015, 4:56 PM  [place] MAIN [facility] SERVICES [address] [place], [place], [address] Phone: [phone]   Fax:  [phone]   Name: Daniel [name] MRN: [id number] Date of Birth: 08-Jan-1956

## 2015-03-30 NOTE — Therapy (Signed)
[place] MAIN [facility] SERVICES [address] [place], [place], [address] Phone: [phone]   Fax:  [phone]  Occupational Therapy Treatment  Patient Details  Name: Daniel [name] MRN: [id number] Date of Birth: 15-May-1956 No Data Recorded  Encounter Date: 03/30/2015      OT End of Session - 03/30/15 1513    Visit Number 16   Number of Visits 24   Date for OT Re-Evaluation 05/02/15   OT Start Time 1415   OT Stop Time 1500   OT Time Calculation (min) 45 min      Past Medical History  Diagnosis Date  . Hypertension   . Pancreatitis   . Diabetes mellitus without complication [facility])     Past Surgical History  Procedure Laterality Date  . Tee without cardioversion N/A 11/01/2014    Procedure: TRANSESOPHAGEAL ECHOCARDIOGRAM (TEE);  Surgeon: Wellington Hampshire, MD;  Location: [facility] ORS;  Service: Cardiovascular;  Laterality: N/A;    There were no vitals filed for this visit.  Visit Diagnosis:  Muscle weakness of left upper extremity      Subjective Assessment - 03/30/15 1512    Subjective  The pain in my shoulder is less when I am on my back.   Pertinent History 59 year old male who suffered a CVA on October 28, 2014.    weight bearing and Patient stretched into reflex inhibiting pattern through out session to aid in normalizing tone.  Mobilization of scapula, shoulder, wrist and hand. Passive stretching in L upper extremity  shoulder flexion, external and internal rotation elbow flexion and extension, forearm supination and pronation, wrist flexion and extension, and hand flexion and extension. Motions of the shoulder and elbow for stretching very limited by pain.  Completed eccentric shoulder flexion in supine. Active elbow flexion and extension completed (very limited 2-/5) wrist flexion and extension (again limited 2-/5). Letter sent to Dr. Kinnie Feil possible injection.                                OT Long Term  Goals - 03/09/15 1439    OT LONG TERM GOAL #1   Title Patient will increase L UE function or compensate to be able to open jars and bottles.   Baseline can now open bottles but not jars   Time 12   Period Weeks   Status Partially Met   OT LONG TERM GOAL #2   Title Will be able to cut his his lawn.   Baseline Unable to cut his grass at this time   Time 12   Period Weeks   Status On-going   OT LONG TERM GOAL #3   Title will be able to cut his food with or with out assistive devices.   Baseline Unable to cut his food   Time 12   Period Weeks   Status On-going   OT LONG TERM GOAL #4   Title Will improve enought to possibly return to work.   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   Time 12   Status Achieved   OT LONG TERM GOAL #6   Title Will be able to tie shoes eather with one handed technique or 2  handed.   Baseline now ties with one handed technique   Time 12   Status Achieved   OT LONG TERM GOAL #7   Title Will control L hand edema to be equil to the right in size.   Baseline hands measure equil   Time 12   Period Weeks   Status Achieved   OT LONG TERM GOAL #8   Title Will increase L UE finger range of motion    Baseline fingers are contracte in flexion.(40%)   Time 12   Period Weeks   Status On-going               Plan - 03/30/15 1513    Clinical Impression Statement Today detected 2-/5 shoulder flexion, elbow fexion and extension, wrist flexion and extension and middle finger movement. supination and pronation is trace.   Pt will benefit from skilled therapeutic intervention in order to improve on the following deficits (Retired) Abnormal gait;Decreased activity tolerance;Decreased balance;Decreased coordination;Decreased endurance;Decreased knowledge of use of DME;Decreased mobility;Decreased range of  motion;Decreased strength;Difficulty walking;Increased edema;Impaired flexibility;Impaired sensation;Impaired tone;Impaired UE functional use   OT Treatment/Interventions Self-care/ADL training;Electrical Stimulation;DME and/or AE instruction;Energy conservation;Neuromuscular education;Therapeutic exercise;Functional Mobility Training;Manual Therapy;Passive range of motion;Splinting;Therapeutic exercises;Therapeutic activities        Problem List Patient Active Problem List   Diagnosis Date Noted  . Stroke ([place]) 10/27/2014  . Lymphadenitis 04/09/2011  . HTN (hypertension) 04/09/2011  . DM (diabetes mellitus) ([place]) 04/09/2011   [name], MS/OTR/L  [name] 03/30/2015, 3:17 PM  [place] MAIN [facility] SERVICES [address] [place], [place], [address] Phone: [phone]   Fax:  [phone]  Name: Daniel [name] MRN: [id number] Date of Birth: May 28, 1956

## 2015-03-31 NOTE — Therapy (Signed)
[place] MAIN [facility] SERVICES [address] [place], [place], [address] Phone: [phone]   Fax:  [phone]  Physical Therapy Treatment  Patient Details  Name: Daniel [name] MRN: [id number] Date of Birth: [birth year] Referring Provider: Nicky [name]   Encounter Date: 03/30/2015      PT End of Session - 03/30/15 1923    Visit Number 15   Number of Visits 17   Date for PT Re-Evaluation 05/09/15   Authorization Type 6/10 g codes   PT Start Time 1600   PT Stop Time 1645   PT Time Calculation (min) 45 min   Equipment Utilized During Treatment Gait belt   Activity Tolerance Patient tolerated treatment well   Behavior During Therapy [facility] for tasks assessed/performed      Past Medical History  Diagnosis Date  . Hypertension   . Pancreatitis   . Diabetes mellitus without complication [facility])     Past Surgical History  Procedure Laterality Date  . Tee without cardioversion N/A 11/01/2014    Procedure: TRANSESOPHAGEAL ECHOCARDIOGRAM (TEE);  Surgeon: Daniel Hampshire, MD;  Location: [facility] ORS;  Service: Cardiovascular;  Laterality: N/A;    There were no vitals filed for this visit.  Visit Diagnosis:  Unsteadiness on feet  Weakness      Subjective Assessment - 03/31/15 0805    Subjective pt reports he has been doing well and his main complaint is his left arm, which continues to bother him but otherwise is doing well.  He feels like his LE is getting stronger.     Patient Stated Goals pt would like to get better use of the LLE and improve his balance.    Currently in Pain? Yes   Pain Score --  no pain value provided   Pain Location Arm   Pain Orientation Left   Pain Onset More than a month ago     There ex:   In [employer] level 4 x 5 min: no charge Singe knee extension with 1 plate 3x10 left LE Single knee extension with 2 plates 2x10 R LE Single knee extension with 2 plates 2x10 with L LE Single hamstring curl with  4 plates 3x10 each LE Pt required cueing to either fully extend each LE during knee extension or to increase knee flexion during hamstring curls.  Pt also required CGA and cueing on how to get in and out of the machine.  Neuro re-ed pt ambulates at normal speed finding playing cards on wall 27ft 2x 1 - CGA for safety and cues to maintain cadence and to attend to left side Side stepping x85 ft each side, with CGA and tactile cueing to maintain hips neutral   Backwards walking x85 ft, with cues increase base of support and to increase stride length   pt requires extra time to complete exercises                         PT Education - 03/30/15 1923    Education provided Yes   Education Details balance exercise progression    Person(s) Educated Patient   Methods Explanation   Comprehension Verbalized understanding             PT Long Term Goals - 03/15/15 0922    PT LONG TERM GOAL #1   Title pt will improve berg balance score to >46 to demonstrate lower fall risk.    Baseline 34/56 on eval; 43/56 on  10/17   Time 8   Period Weeks   Status On-going   PT LONG TERM GOAL #2   Title pt will ambulate at 0.36m/s with LRAD to appropriately navigate home distances.    Baseline 0.54 m/s on 10/17   Time 8   Period Weeks   Status On-going   PT LONG TERM GOAL #3   Title pt will preform 5xsit to stand in 15 s demonstrating improve LE strength.    Baseline 28 sec on 10/17   Time 8   Period Weeks   Status On-going   PT LONG TERM GOAL #5   Title pt will be able to safely ambulate x 521ft using LRAD to negotiate short community distances.    Baseline 485 ft in 6 min on 10/17   Time 8   Period Weeks   Status On-going               Plan - 03/31/15 8206    Clinical Impression Statement Pt is progressing well with PT and is able to safely ambulate in the hallway while performing forward and sidestepping.  He requires cueing during backwards walking to increase base  of support to improve safety and steadiness.  Pt will benefit from continued skilled PT services to improve functional mobility while dual tasking to increase independence.     Pt will benefit from skilled therapeutic intervention in order to improve on the following deficits Decreased balance;Decreased activity tolerance;Decreased endurance;Decreased mobility;Decreased strength;Increased edema;Decreased safety awareness;Decreased coordination;Difficulty walking;Decreased knowledge of use of DME   Rehab Potential Good   PT Frequency 2x / week   PT Duration 8 weeks   PT Treatment/Interventions ADLs/Self Care Home Management;Electrical Stimulation;Therapeutic exercise;Therapeutic activities;Functional mobility training;Stair training;Gait training;DME Instruction;Balance training;Neuromuscular re-education;Manual techniques;Splinting;Passive range of motion;Visual/perceptual remediation/compensation   PT Next Visit Plan dual task balance activities    Consulted and Agree with Plan of Care Patient        Problem List Patient Active Problem List   Diagnosis Date Noted  . Stroke ([place]) 10/27/2014  . Lymphadenitis 04/09/2011  . HTN (hypertension) 04/09/2011  . DM (diabetes mellitus) ([place]) 04/09/2011   Daniel [name], SPT This entire session was performed under direct supervision and direction of a licensed therapist/therapist assistant . I have personally read, edited and approve of the note as written. Daniel [name], PT, DPT [phone]  Daniel [name] 03/31/2015, 9:39 AM  [place] MAIN [facility] SERVICES [address] [place], [place], [address] Phone: [phone]   Fax:  [phone]  Name: Daniel [name] MRN: [id number] Date of Birth: Apr 02, 1956

## 2015-04-04 ENCOUNTER — Ambulatory Visit: Payer: No Typology Code available for payment source

## 2015-04-04 ENCOUNTER — Encounter: Payer: Self-pay | Admitting: Occupational Therapy

## 2015-04-04 ENCOUNTER — Ambulatory Visit: Payer: No Typology Code available for payment source | Admitting: Occupational Therapy

## 2015-04-04 ENCOUNTER — Ambulatory Visit: Payer: No Typology Code available for payment source | Admitting: Speech Pathology

## 2015-04-04 DIAGNOSIS — M6281 Muscle weakness (generalized): Secondary | ICD-10-CM | POA: Diagnosis not present

## 2015-04-04 DIAGNOSIS — R2681 Unsteadiness on feet: Secondary | ICD-10-CM

## 2015-04-04 DIAGNOSIS — R41841 Cognitive communication deficit: Secondary | ICD-10-CM

## 2015-04-04 DIAGNOSIS — R531 Weakness: Secondary | ICD-10-CM

## 2015-04-04 NOTE — Therapy (Signed)
[place] MAIN [facility] SERVICES [address] [place], [place], [address] Phone: [phone]   Fax:  [phone]  Occupational Therapy Treatment  Patient Details  Name: Daniel [name] MRN: [id number] Date of Birth: 17-Oct-1955 No Data Recorded  Encounter Date: 04/04/2015      OT End of Session - 04/04/15 1517    Visit Number 17   Number of Visits 24   Date for OT Re-Evaluation 05/02/15   OT Start Time 1520   OT Stop Time 1600   OT Time Calculation (min) 40 min   Behavior During Therapy [facility] for tasks assessed/performed      Past Medical History  Diagnosis Date  . Hypertension   . Pancreatitis   . Diabetes mellitus without complication [facility])     Past Surgical History  Procedure Laterality Date  . Tee without cardioversion N/A 11/01/2014    Procedure: TRANSESOPHAGEAL ECHOCARDIOGRAM (TEE);  Surgeon: Wellington Hampshire, MD;  Location: [facility] ORS;  Service: Cardiovascular;  Laterality: N/A;    There were no vitals filed for this visit.  Visit Diagnosis:  Muscle weakness of left upper extremity      Subjective Assessment - 04/04/15 1515    Subjective  Had been telling this writer that he uses his splint, wife reports no!   Pertinent History 59 year old male who suffered a CVA on October 28, 2014.    Mobilization of scapula wrist, fingers,  and humerus, Patient stretched into reflex inhibiting pattern through out session to aid in normalizing tone. Stretching in L upper extremity  shoulder flexion, external and internal rotation elbow flexion and extension, forearm supination and pronation, wrist flexion and extension, and hand flexion and extension, but very limited by tightness and pain.  Weight bearing through out session. Cues for all activities for position and technique.                          OT Education - 04/04/15 1517    Education provided Yes   Education Details imprortance of wearing splint   Person(s)  Educated Patient;Spouse   Methods Explanation   Comprehension Verbalized understanding             OT Long Term Goals - 03/09/15 1439    OT LONG TERM GOAL #1   Title Patient will increase L UE function or compensate to be able to open jars and bottles.   Baseline can now open bottles but not jars   Time 12   Period Weeks   Status Partially Met   OT LONG TERM GOAL #2   Title Will be able to cut his his lawn.   Baseline Unable to cut his grass at this time   Time 12   Period Weeks   Status On-going   OT LONG TERM GOAL #3   Title will be able to cut his food with or with out assistive devices.   Baseline Unable to cut his food   Time 12   Period Weeks   Status On-going   OT LONG TERM GOAL #4   Title Will improve enought to possibly return to work.   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   Time 12   Status Achieved  OT LONG TERM GOAL #6   Title Will be able to tie shoes eather with one handed technique or 2 handed.   Baseline now ties with one handed technique   Time 12   Status Achieved   OT LONG TERM GOAL #7   Title Will control L hand edema to be equil to the right in size.   Baseline hands measure equil   Time 12   Period Weeks   Status Achieved   OT LONG TERM GOAL #8   Title Will increase L UE finger range of motion    Baseline fingers are contracte in flexion.(40%)   Time 12   Period Weeks   Status On-going               Plan - 04/04/15 1518    Clinical Impression Statement still detected very tiny movments in areas of L UE. also Active shoulder shrug and shoulder retraction evedent.   Pt will benefit from skilled therapeutic intervention in order to improve on the following deficits (Retired) Abnormal gait;Decreased activity tolerance;Decreased balance;Decreased coordination;Decreased  endurance;Decreased knowledge of use of DME;Decreased mobility;Decreased range of motion;Decreased strength;Difficulty walking;Increased edema;Impaired flexibility;Impaired sensation;Impaired tone;Impaired UE functional use   OT Treatment/Interventions Self-care/ADL training;Electrical Stimulation;DME and/or AE instruction;Energy conservation;Neuromuscular education;Therapeutic exercise;Functional Mobility Training;Manual Therapy;Passive range of motion;Splinting;Therapeutic exercises;Therapeutic activities        Problem List Patient Active Problem List   Diagnosis Date Noted  . Stroke ([place]) 10/27/2014  . Lymphadenitis 04/09/2011  . HTN (hypertension) 04/09/2011  . DM (diabetes mellitus) ([place]) 04/09/2011   [name], MS/OTR/L  [name] 04/04/2015, 3:21 PM  [place] MAIN [facility] SERVICES [address] [place], [place], [address] Phone: [phone]   Fax:  [phone]  Name: Daniel [name] MRN: [id number] Date of Birth: [birth year]

## 2015-04-04 NOTE — Therapy (Signed)
[place] MAIN [facility] SERVICES [address] [place], [place], [address] Phone: [phone]   Fax:  [phone]  Physical Therapy Treatment  Patient Details  Name: Daniel [name] MRN: [id number] Date of Birth: [birth year] Referring Provider: Nicky Pugh   Encounter Date: 04/04/2015      PT End of Session - 04/04/15 1850    Visit Number 16   Number of Visits 17   Date for PT Re-Evaluation 05/09/15   Authorization Type 7/10 g codes   PT Start Time 1600   PT Stop Time 1645   PT Time Calculation (min) 45 min   Equipment Utilized During Treatment Gait belt   Activity Tolerance Patient tolerated treatment well   Behavior During Therapy [facility] for tasks assessed/performed      Past Medical History  Diagnosis Date  . Hypertension   . Pancreatitis   . Diabetes mellitus without complication [facility])     Past Surgical History  Procedure Laterality Date  . Tee without cardioversion N/A 11/01/2014    Procedure: TRANSESOPHAGEAL ECHOCARDIOGRAM (TEE);  Surgeon: Wellington Hampshire, MD;  Location: [facility] ORS;  Service: Cardiovascular;  Laterality: N/A;    There were no vitals filed for this visit.  Visit Diagnosis:  Unsteadiness on feet  Weakness      Subjective Assessment - 04/04/15 1848    Subjective Pt relates for the past two days he has had >7/10 pain in his left shoulder and currently it is at a 8/10. Pt feels safer performing steps at home and relates he has not fallen or had near falls.   Patient Stated Goals pt would like to get better use of the LLE and improve his balance.    Currently in Pain? Yes   Pain Score 8    Pain Location Shoulder   Pain Orientation Left   Pain Onset More than a month ago       Neuro re-ed: Nustep level 3 x4 minutes: no charge Pt used R to move balls up and down on Multi-level tower x1 , with emphasis on looking to his left side   Pt used R to move balls up and down on Multi-level tower x3 while standing on  [employer] , with emphasis on looking to his left side, pt was able to maintain static balance without assistance Step ups/down a 6 inch step 2x10 each LE, pt requires UE assist when ascending with left and descending with right Pt ambulated around the gym placing cones in different locations on his left side x 50 meters with CGA for safety.   In the hall, pt ambulated without quad cane x80 ft while looking at cards on his left and carrying a cone.  with CGA for safety and cues to maintain cadence and improve foot clearance. In the hall, pt ambulated with quad cane x80 ft while looking at cards on his left with CGA for safety and cues to maintain cadence and improve foot clearance.  Pt reported increased pain in his left shoulder at the end of the session.                            PT Education - 04/04/15 1850    Education provided Yes   Education Details plan of care and ambulating short distances without cane and performing dual task   Person(s) Educated Patient   Methods Explanation   Comprehension Verbalized understanding  PT Long Term Goals - 03/15/15 9326    PT LONG TERM GOAL #1   Title pt will improve berg balance score to >46 to demonstrate lower fall risk.    Baseline 34/56 on eval; 43/56 on 10/17   Time 8   Period Weeks   Status On-going   PT LONG TERM GOAL #2   Title pt will ambulate at 0.33m/s with LRAD to appropriately navigate home distances.    Baseline 0.54 m/s on 10/17   Time 8   Period Weeks   Status On-going   PT LONG TERM GOAL #3   Title pt will preform 5xsit to stand in 15 s demonstrating improve LE strength.    Baseline 28 sec on 10/17   Time 8   Period Weeks   Status On-going   PT LONG TERM GOAL #5   Title pt will be able to safely ambulate x 580ft using LRAD to negotiate short community distances.    Baseline 485 ft in 6 min on 10/17   Time 8   Period Weeks   Status On-going               Plan - 04/04/15 1851     Clinical Impression Statement Session focused on performing balance activities while performing duals tasks and performing tasks on his left side to improve left sided neglect.  Pt did really well ambulating without quad cane without LOB while performing vertical horizontal head turns and carrying a cone.     Pt will benefit from skilled therapeutic intervention in order to improve on the following deficits Decreased balance;Decreased activity tolerance;Decreased endurance;Decreased mobility;Decreased strength;Increased edema;Decreased safety awareness;Decreased coordination;Difficulty walking;Decreased knowledge of use of DME   Rehab Potential Good   PT Frequency 2x / week   PT Duration 8 weeks   PT Treatment/Interventions ADLs/Self Care Home Management;Electrical Stimulation;Therapeutic exercise;Therapeutic activities;Functional mobility training;Stair training;Gait training;DME Instruction;Balance training;Neuromuscular re-education;Manual techniques;Splinting;Passive range of motion;Visual/perceptual remediation/compensation   PT Next Visit Plan dual task balance activities    Consulted and Agree with Plan of Care Patient        Problem List Patient Active Problem List   Diagnosis Date Noted  . Stroke ([place]) 10/27/2014  . Lymphadenitis 04/09/2011  . HTN (hypertension) 04/09/2011  . DM (diabetes mellitus) ([place]) 04/09/2011   [name], SPT This entire session was performed under direct supervision and direction of a licensed therapist/therapist assistant . I have personally read, edited and approve of the note as written. Gorden Harms. Tortorici, PT, DPT [phone]  Tortorici,Ashley 04/05/2015, 6:13 PM  [place] MAIN [facility] SERVICES [address] [place], [place], [address] Phone: [phone]   Fax:  [phone]  Name: Daniel [name] MRN: [id number] Date of Birth: 01/17/1956

## 2015-04-04 NOTE — Patient Instructions (Signed)
Instructed patient that he really needs to were the splint as it is hard to stretch the fingers strait, and they are all contracted.

## 2015-04-05 ENCOUNTER — Encounter: Payer: Self-pay | Admitting: Speech Pathology

## 2015-04-05 NOTE — Therapy (Signed)
[place] MAIN [facility] SERVICES [address] [place], [place], [address] Phone: [phone]   Fax:  [phone]  Speech Language Pathology Treatment  Patient Details  Name: Daniel [name] MRN: [id number] Date of Birth: [birth year] No Data Recorded  Encounter Date: 04/04/2015      End of Session - 04/05/15 1517    Visit Number 12   Number of Visits 17   Date for SLP Re-Evaluation 04/24/15   SLP Start Time 1500   SLP Stop Time  1552   SLP Time Calculation (min) 52 min   Activity Tolerance Patient tolerated treatment well      Past Medical History  Diagnosis Date  . Hypertension   . Pancreatitis   . Diabetes mellitus without complication [facility])     Past Surgical History  Procedure Laterality Date  . Tee without cardioversion N/A 11/01/2014    Procedure: TRANSESOPHAGEAL ECHOCARDIOGRAM (TEE);  Surgeon: Wellington Hampshire, MD;  Location: [facility] ORS;  Service: Cardiovascular;  Laterality: N/A;    There were no vitals filed for this visit.  Visit Diagnosis: Cognitive communication deficit      Subjective Assessment - 04/05/15 1515    Subjective "I forget a lot"               ADULT SLP TREATMENT - 04/05/15 0001    General Information   Behavior/Cognition Alert;Cooperative;Impulsive;Distractible;Requires cueing;Decreased sustained attention;Confused   Treatment Provided   Treatment provided Cognitive-Linquistic   Pain Assessment   Pain Assessment No/denies pain   Cognitive-Linquistic Treatment   Treatment focused on Cognition   Skilled Treatment ATTENTION:  Complete a variety of moderate reading/pencil and paper tasks with 70% accuracy given mod-max cues to attend and recall task rules; errors due to mis-reading word, losing place, and skipping items.  Continues to exhibit more off-task, internal distraction as complexity increases.  SAFETY AWARENESS: States pictured household hazards with 80% accuracy; errors due to  over-personalization ("I never do that activity").    Assessment / Recommendations / Plan   Plan Continue with current plan of care   Progression Toward Goals   Progression toward goals Progressing toward goals          SLP Education - 04/05/15 1515    Education provided Yes   Education Details Need to attend to detail, need to be aware of environmental hazards   Person(s) Educated Patient   Methods Explanation   Comprehension Verbalized understanding            SLP Long Term Goals - 03/30/15 1655    SLP LONG TERM GOAL #1   Title Patient will demonstrate functional cognitive-communication skills for independent completion of personal responsibilities given mod support.   Time 8   Period Weeks   Status Partially Met   SLP LONG TERM GOAL #2   Title Patient will complete complex attention tasks with 80% accuracy.   Time 8   Period Weeks   Status Partially Met   SLP LONG TERM GOAL #3   Title Patient will complete complex executive function skills tasks with 80% accuracy.   Time 8   Period Weeks   Status Partially Met   SLP LONG TERM GOAL #4   Title Patient will complete complex visual-spatial activities with 80% accuracy.   Time 8   Period Weeks   Status Partially Met          Plan - 04/05/15 1518    Clinical Impression Statement The patient is presenting with significant attention  deficits that become more disruptive as the cognitive load increases.  He is able to accurately complete simple cognitive tasks with minimal external cues.    Speech Therapy Frequency 2x / week   Duration Other (comment)   Treatment/Interventions Cognitive reorganization;Internal/external aids;Functional tasks;Compensatory strategies;Patient/family education   Potential to Achieve Goals Fair   Potential Considerations Ability to learn/carryover information;Cooperation/participation level;Medical prognosis;Previous level of function;Severity of impairments;Family/community support    Consulted and Agree with Plan of Care Patient        Problem List Patient Active Problem List   Diagnosis Date Noted  . Stroke ([place]) 10/27/2014  . Lymphadenitis 04/09/2011  . HTN (hypertension) 04/09/2011  . DM (diabetes mellitus) ([place]) 04/09/2011   [name], MS/CCC- SLP  [name] 04/05/2015, 3:19 PM  [place] MAIN [facility] SERVICES [address] [place], [place], [address] Phone: [phone]   Fax:  [phone]   Name: Daniel [name] MRN: [id number] Date of Birth: 06-Mar-1956

## 2015-04-06 ENCOUNTER — Encounter: Payer: No Typology Code available for payment source | Admitting: Occupational Therapy

## 2015-04-06 ENCOUNTER — Ambulatory Visit: Payer: No Typology Code available for payment source

## 2015-04-06 ENCOUNTER — Ambulatory Visit: Payer: No Typology Code available for payment source | Admitting: Speech Pathology

## 2015-04-06 ENCOUNTER — Encounter: Payer: Self-pay | Admitting: Speech Pathology

## 2015-04-06 ENCOUNTER — Ambulatory Visit: Payer: No Typology Code available for payment source | Admitting: Occupational Therapy

## 2015-04-06 DIAGNOSIS — R531 Weakness: Secondary | ICD-10-CM

## 2015-04-06 DIAGNOSIS — R41841 Cognitive communication deficit: Secondary | ICD-10-CM

## 2015-04-06 DIAGNOSIS — R2681 Unsteadiness on feet: Secondary | ICD-10-CM

## 2015-04-06 DIAGNOSIS — M6281 Muscle weakness (generalized): Secondary | ICD-10-CM | POA: Diagnosis not present

## 2015-04-06 NOTE — Therapy (Signed)
[place] MAIN [facility] SERVICES [address] [place], [place], [address] Phone: [phone]   Fax:  [phone]  Speech Language Pathology Treatment  Patient Details  Name: Daniel [name] MRN: [id number] Date of Birth: 05/25/1956 No Data Recorded  Encounter Date: 04/06/2015      End of Session - 04/06/15 1514    Visit Number 13   Number of Visits 17   Date for SLP Re-Evaluation 04/24/15   SLP Start Time 1405   SLP Stop Time  1500   SLP Time Calculation (min) 55 min   Activity Tolerance Patient tolerated treatment well      Past Medical History  Diagnosis Date  . Hypertension   . Pancreatitis   . Diabetes mellitus without complication [facility])     Past Surgical History  Procedure Laterality Date  . Tee without cardioversion N/A 11/01/2014    Procedure: TRANSESOPHAGEAL ECHOCARDIOGRAM (TEE);  Surgeon: Wellington Hampshire, MD;  Location: [facility] ORS;  Service: Cardiovascular;  Laterality: N/A;    There were no vitals filed for this visit.  Visit Diagnosis: Cognitive communication deficit      Subjective Assessment - 04/06/15 1513    Subjective Pt reports that he enjoys therapy because it helps him "think."   Currently in Pain? No/denies               ADULT SLP TREATMENT - 04/06/15 0001    General Information   Behavior/Cognition Alert;Cooperative;Impulsive;Distractible;Requires cueing;Decreased sustained attention;Confused   Treatment Provided   Treatment provided Cognitive-Linquistic   Pain Assessment   Pain Assessment No/denies pain   Cognitive-Linquistic Treatment   Treatment focused on Cognition   Skilled Treatment EXECUTIVE FUNCTION: Answered and wrote response to clinician telling time questions with 42% accuracy and independence, increasing to 83% accuracy with mod-max cueing. Elapsed time questions required highest level of cueing.Pt responded to questions re: a calendar with 67% acc independently, improving to 100%  w/mod verbal and visual cues. Pt grouped items into a category with explanation with 50% acc. Pt was able to group items with 90%, however required mod-max cues to provide apprpriate explanation. Pt provided solutions to simple problems with 80% acc.  ATTENTION: The patient exhibited minimal difficulty alternating attention between two tasks when requested to set aside previous activity and resume other. One re-direct needed for sustained attention during treatment session. VISUOSPATIAL: Patient completed visual activity by matching dot design in a field of ten with 75% accuracy and independence, improving to 100% accuracy with moderate cueing.   Assessment / Recommendations / Plan   Plan Continue with current plan of care   Progression Toward Goals   Progression toward goals Progressing toward goals          SLP Education - 04/06/15 1513    Education provided Yes   Education Details Scanning page, attention to detail   Person(s) Educated Patient   Methods Explanation;Demonstration   Comprehension Verbalized understanding            SLP Long Term Goals - 03/30/15 1655    SLP LONG TERM GOAL #1   Title Patient will demonstrate functional cognitive-communication skills for independent completion of personal responsibilities given mod support.   Time 8   Period Weeks   Status Partially Met   SLP LONG TERM GOAL #2   Title Patient will complete complex attention tasks with 80% accuracy.   Time 8   Period Weeks   Status Partially Met   SLP LONG TERM GOAL #3  Title Patient will complete complex executive function skills tasks with 80% accuracy.   Time 8   Period Weeks   Status Partially Met   SLP LONG TERM GOAL #4   Title Patient will complete complex visual-spatial activities with 80% accuracy.   Time 8   Period Weeks   Status Partially Met          Plan - 04/06/15 1514    Clinical Impression Statement The patient is presenting with significant attention deficits that  become more disruptive as the cognitive load increases.  He is able to accurately complete simple cognitive tasks with minimal external cues. Pt continues to require assistance with visual scanning with tasks.    Speech Therapy Frequency 2x / week   Duration Other (comment)  8 weeks   Treatment/Interventions Cognitive reorganization;Internal/external aids;Functional tasks;Compensatory strategies;Patient/family education   Potential to Achieve Goals Fair   Potential Considerations Ability to learn/carryover information;Cooperation/participation level;Medical prognosis;Previous level of function;Severity of impairments;Family/community support   SLP Home Exercise Plan To be determined   Consulted and Agree with Plan of Care Patient        Problem List Patient Active Problem List   Diagnosis Date Noted  . Stroke ([place]) 10/27/2014  . Lymphadenitis 04/09/2011  . HTN (hypertension) 04/09/2011  . DM (diabetes mellitus) ([place]) 04/09/2011    Alpha,Bentlie Catanzaro 04/06/2015, 3:16 PM  [place] MAIN [facility] SERVICES [address] [place], [place], [address] Phone: [phone]   Fax:  [phone]   Name: Daniel [name] MRN: [id number] Date of Birth: 07-Jun-1955

## 2015-04-07 NOTE — Therapy (Signed)
[place] MAIN [facility] SERVICES [address] [place], [place], [address] Phone: [phone]   Fax:  [phone]  Occupational Therapy Treatment  Patient Details  Name: Daniel [name] MRN: [id number] Date of Birth: [birth year] No Data Recorded  Encounter Date: 04/06/2015      OT End of Session - 04/06/15 1601    Visit Number 18   Number of Visits 24   Date for OT Re-Evaluation 05/02/15   OT Start Time 1500   OT Stop Time 1550   OT Time Calculation (min) 50 min   Equipment Utilized During Treatment moist heat to left shoulder for 5 minutes prior to tx ([place])   Activity Tolerance Patient tolerated treatment well;Patient limited by pain   Behavior During Therapy [facility] for tasks assessed/performed      Past Medical History  Diagnosis Date  . Hypertension   . Pancreatitis   . Diabetes mellitus without complication [facility])     Past Surgical History  Procedure Laterality Date  . Tee without cardioversion N/A 11/01/2014    Procedure: TRANSESOPHAGEAL ECHOCARDIOGRAM (TEE);  Surgeon: Wellington Hampshire, MD;  Location: [facility] ORS;  Service: Cardiovascular;  Laterality: N/A;    There were no vitals filed for this visit.  Visit Diagnosis:  Muscle weakness of left upper extremity      Subjective Assessment - 04/06/15 1559    Subjective  Patient reports he has been trying to move his arm, reports he tried sitting in front of heater on the left side and it felt better when trying to do his exercises.    Pertinent History 59 year old male who suffered a CVA on October 28, 2014.   Limitations L arm use.   Patient Stated Goals To get my left side working, be able to take care of myself.   Currently in Pain? Yes   Pain Score 5    Pain Location Arm   Pain Orientation Left   Pain Descriptors / Indicators Aching;Numbness   Pain Type Chronic pain   Pain Onset More than a month ago   Pain Frequency Intermittent   Multiple Pain Sites No                       OT Treatments/Exercises (OP) - 04/07/15 2054    ADLs   ADL Comments ABD of LUE for access to left armpit for aplication of deodorant.    Neurological Re-education Exercises   Other Exercises 1 Patient was seen for PROM of LUE in supine with mobilization to left shoulder joint for posterior and inferior glides, grade II as well as mobilization to the left scapula for elevation, depression and upwards rotation to decrease pain and increase motion performed in sidelying. PROM to LUE for shoulder flexion, ABD, ER, elbow flexion/extension, supination, wrist extension and finger flexion/extension with prolonged stretch as needed as well as distractors. AAROM with facilitation attempted for all motions, patient limited by pain. Limited elbow flexion and extension but able to demo trace extension on command and with facilitation.                OT Education - 04/06/15 1601    Education provided Yes   Education Details ROM and positioning of the left arm   Person(s) Educated Patient   Methods Explanation;Demonstration;Verbal cues   Comprehension Verbal cues required;Returned demonstration;Verbalized understanding             OT Long Term Goals - 03/09/15 1439  OT LONG TERM GOAL #1   Title Patient will increase L UE function or compensate to be able to open jars and bottles.   Baseline can now open bottles but not jars   Time 12   Period Weeks   Status Partially Met   OT LONG TERM GOAL #2   Title Will be able to cut his his lawn.   Baseline Unable to cut his grass at this time   Time 12   Period Weeks   Status On-going   OT LONG TERM GOAL #3   Title will be able to cut his food with or with out assistive devices.   Baseline Unable to cut his food   Time 12   Period Weeks   Status On-going   OT LONG TERM GOAL #4   Title Will improve enought to possibly return to work.   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   Time 12   Status Achieved   OT LONG TERM GOAL #6   Title Will be able to tie shoes eather with one handed technique or 2 handed.   Baseline now ties with one handed technique   Time 12   Status Achieved   OT LONG TERM GOAL #7   Title Will control L hand edema to be equil to the right in size.   Baseline hands measure equil   Time 12   Period Weeks   Status Achieved   OT LONG TERM GOAL #8   Title Will increase L UE finger range of motion    Baseline fingers are contracte in flexion.(40%)   Time 12   Period Weeks   Status On-going               Plan - 04/06/15 1602    Clinical Impression Statement Patient demonstrates increased tightness around scapular area of left UE, pain noted with range of motion and patient admits to not tolerating much ROM at home.  After mobilizations, patient was able to tolerate increased ROM for all directions along with slow, prolonged stretch and attempts to faciliate movements.  Able to demonstrate understanding of positioning and the need for daily ROM.     Pt will benefit from skilled therapeutic intervention in order to improve on the following deficits (Retired) Abnormal gait;Decreased activity tolerance;Decreased balance;Decreased coordination;Decreased endurance;Decreased knowledge of use of DME;Decreased mobility;Decreased range of motion;Decreased strength;Difficulty walking;Increased edema;Impaired flexibility;Impaired sensation;Impaired tone;Impaired UE functional use   Rehab Potential Good   OT Frequency 2x / week   OT Duration 12 weeks   OT Treatment/Interventions Self-care/ADL training;Electrical Stimulation;DME and/or AE instruction;Energy conservation;Neuromuscular education;Therapeutic exercise;Functional Mobility Training;Manual Therapy;Passive range of motion;Splinting;Therapeutic exercises;Therapeutic  activities   Consulted and Agree with Plan of Care Patient        Problem List Patient Active Problem List   Diagnosis Date Noted  . Stroke ([place]) 10/27/2014  . Lymphadenitis 04/09/2011  . HTN (hypertension) 04/09/2011  . DM (diabetes mellitus) ([place]) 04/09/2011   Saahil Herbster T Madelyn Tlatelpa, OTR/L, CLT  Lennis Korb 04/07/2015, 8:59 PM  [place] MAIN [facility] SERVICES [address] [place], [place], [address] Phone: [phone]   Fax:  [phone]  Name: KORE MADLOCK MRN: [id number] Date of Birth: Dec 17, 1955

## 2015-04-07 NOTE — Therapy (Signed)
[place] MAIN [facility] SERVICES [address] [place], [place], [address] Phone: [phone]   Fax:  [phone]  Physical Therapy Treatment  Patient Details  Name: Daniel [name] MRN: [id number] Date of Birth: [birth year] Referring Provider: Nicky Pugh   Encounter Date: 04/06/2015      PT End of Session - 04/07/15 1554    Visit Number 17   Number of Visits 20   Date for PT Re-Evaluation 05/09/15   Authorization Type 8/10 g codes   PT Start Time 1600   PT Stop Time [id number]   PT Time Calculation (min) 45 min   Equipment Utilized During Treatment Gait belt   Activity Tolerance Patient tolerated treatment well   Behavior During Therapy [facility] for tasks assessed/performed      Past Medical History  Diagnosis Date  . Hypertension   . Pancreatitis   . Diabetes mellitus without complication [facility])     Past Surgical History  Procedure Laterality Date  . Tee without cardioversion N/A 11/01/2014    Procedure: TRANSESOPHAGEAL ECHOCARDIOGRAM (TEE);  Surgeon: Wellington Hampshire, MD;  Location: [facility] ORS;  Service: Cardiovascular;  Laterality: N/A;    There were no vitals filed for this visit.  Visit Diagnosis:  Unsteadiness on feet  Weakness      Subjective Assessment - 04/07/15 1553    Subjective Pt reports he is doing better today.  His left shoulder is not hurting currently and is looking forward to PT.     Patient Stated Goals pt would like to get better use of the LLE and improve his balance.    Currently in Pain? No/denies   Pain Score 0-No pain   Pain Onset More than a month ago      Neuro re-ed: Multi-level Tower x5 min Obstacle course with cones, half foam and cone taps x10 min and with and without quad cane In hallway- pt ambulates at normal speed finding playing cards on wall 58ft x 1 - SBA for safety and cues to maintain cadence   In hallway- pt ambulates at normal speed finding playing cards on wall 91ft x 1 - SBA for safety and  cues to maintain cadence and without quad cane Side toe taps with LE 2x10 Ball toss on to SPT to pt's left side while standing on [employer] X4 MIN  Pt using left hand with green ball to target SPT hand/s on pt's left side Marching with cane and without in hallway 2x85 ft Stair training with verbal cues of "up with the good and down with the bad" and correct cane sequencing                           PT Education - 04/07/15 1554    Education provided Yes   Education Details new balance exercises   Person(s) Educated Patient   Methods Explanation   Comprehension Verbalized understanding             PT Long Term Goals - 03/15/15 0922    PT LONG TERM GOAL #1   Title pt will improve berg balance score to >46 to demonstrate lower fall risk.    Baseline 34/56 on eval; 43/56 on 10/17   Time 8   Period Weeks   Status On-going   PT LONG TERM GOAL #2   Title pt will ambulate at 0.57m/s with LRAD to appropriately navigate home distances.    Baseline 0.54 m/s on 10/17  Time 8   Period Weeks   Status On-going   PT LONG TERM GOAL #3   Title pt will preform 5xsit to stand in 15 s demonstrating improve LE strength.    Baseline 28 sec on 10/17   Time 8   Period Weeks   Status On-going   PT LONG TERM GOAL #5   Title pt will be able to safely ambulate x 561ft using LRAD to negotiate short community distances.    Baseline 485 ft in 6 min on 10/17   Time 8   Period Weeks   Status On-going               Plan - 04/07/15 1555    Clinical Impression Statement Pt did really well today and demonstrated improved attention to his left side after performing ball activities.  Pt demonstrates safe stair negotiation technique today after instruction on correct foot and quad cane sequencing.  Pt did not experience a LOB throughout session today.      Pt will benefit from skilled therapeutic intervention in order to improve on the following deficits Decreased balance;Decreased  activity tolerance;Decreased endurance;Decreased mobility;Decreased strength;Increased edema;Decreased safety awareness;Decreased coordination;Difficulty walking;Decreased knowledge of use of DME   Rehab Potential Good   PT Frequency 2x / week   PT Duration 8 weeks   PT Treatment/Interventions ADLs/Self Care Home Management;Electrical Stimulation;Therapeutic exercise;Therapeutic activities;Functional mobility training;Stair training;Gait training;DME Instruction;Balance training;Neuromuscular re-education;Manual techniques;Splinting;Passive range of motion;Visual/perceptual remediation/compensation   PT Next Visit Plan dual task balance activities    Consulted and Agree with Plan of Care Patient        Problem List Patient Active Problem List   Diagnosis Date Noted  . Stroke ([place]) 10/27/2014  . Lymphadenitis 04/09/2011  . HTN (hypertension) 04/09/2011  . DM (diabetes mellitus) ([place]) 04/09/2011   [name], SPT This entire session was performed under direct supervision and direction of a licensed therapist/therapist assistant . I have personally read, edited and approve of the note as written. Gorden Harms. Tortorici, PT, DPT [phone]  Tortorici,Ashley 04/07/2015, 4:50 PM  [place] MAIN [facility] SERVICES [address] [place], [place], [address] Phone: [phone]   Fax:  [phone]  Name: BAYNE BUEHRER MRN: [id number] Date of Birth: 03-Sep-1955

## 2015-04-11 ENCOUNTER — Encounter: Payer: Self-pay | Admitting: Speech Pathology

## 2015-04-11 ENCOUNTER — Encounter: Payer: No Typology Code available for payment source | Admitting: Occupational Therapy

## 2015-04-11 ENCOUNTER — Ambulatory Visit: Payer: No Typology Code available for payment source

## 2015-04-11 ENCOUNTER — Ambulatory Visit: Payer: No Typology Code available for payment source | Admitting: Occupational Therapy

## 2015-04-11 ENCOUNTER — Ambulatory Visit: Payer: No Typology Code available for payment source | Admitting: Speech Pathology

## 2015-04-11 DIAGNOSIS — M6281 Muscle weakness (generalized): Secondary | ICD-10-CM

## 2015-04-11 DIAGNOSIS — R2681 Unsteadiness on feet: Secondary | ICD-10-CM

## 2015-04-11 DIAGNOSIS — R531 Weakness: Secondary | ICD-10-CM

## 2015-04-11 DIAGNOSIS — R41841 Cognitive communication deficit: Secondary | ICD-10-CM

## 2015-04-11 NOTE — Therapy (Signed)
[place] MAIN [facility] SERVICES [address] [place], [place], [address] Phone: [phone]   Fax:  [phone]  Speech Language Pathology Treatment  Patient Details  Name: Daniel [name] MRN: [id number] Date of Birth: 03-05-1956 No Data Recorded  Encounter Date: 04/11/2015      End of Session - 04/11/15 1502    Visit Number 14   Number of Visits 17   Date for SLP Re-Evaluation 04/24/15   SLP Start Time 47   SLP Stop Time  1455   SLP Time Calculation (min) 40 min   Activity Tolerance Patient tolerated treatment well      Past Medical History  Diagnosis Date  . Hypertension   . Pancreatitis   . Diabetes mellitus without complication [facility]     Past Surgical History  Procedure Laterality Date  . Tee without cardioversion N/A 11/01/2014    Procedure: TRANSESOPHAGEAL ECHOCARDIOGRAM (TEE);  Surgeon: Wellington Hampshire, MD;  Location: [facility] ORS;  Service: Cardiovascular;  Laterality: N/A;    There were no vitals filed for this visit.  Visit Diagnosis: No diagnosis found.      Subjective Assessment - 04/11/15 1500    Subjective Patient reports that medicine he took before coming to therapy makes him drowsy. Shows interest in visiting work when he previously did not.    Patient is accompained by: Family member   Currently in Pain? No/denies   Pain Onset More than a month ago               ADULT SLP TREATMENT - 04/11/15 0001    General Information   Behavior/Cognition Alert;Cooperative;Impulsive;Distractible;Requires cueing;Decreased sustained attention;Confused   Treatment Provided   Treatment provided Cognitive-Linquistic   Pain Assessment   Pain Assessment No/denies pain   Cognitive-Linquistic Treatment   Treatment focused on Cognition   Skilled Treatment VISUOSPATIAL: Scanned worksheet and transferred information (coded letters for numbers) to designated spaces with 66% accuracy and independence increasing to 100%  accuracy with min-max cueing. Errors included using incorrect code or incorrectly matching letter to number within the correct code. SAFETY AWARENESS: States pictured household hazards with 100% accuracy; required min prompting for explanations of why the situation may be dangerous rather than just a description of the hazard.   Assessment / Recommendations / Plan   Plan Continue with current plan of care   Progression Toward Goals   Progression toward goals Progressing toward goals          SLP Education - 04/11/15 1500    Education provided Yes   Education Details Scanning information   Person(s) Educated Patient   Methods Explanation   Comprehension Verbalized understanding            SLP Long Term Goals - 03/30/15 1655    SLP LONG TERM GOAL #1   Title Patient will demonstrate functional cognitive-communication skills for independent completion of personal responsibilities given mod support.   Time 8   Period Weeks   Status Partially Met   SLP LONG TERM GOAL #2   Title Patient will complete complex attention tasks with 80% accuracy.   Time 8   Period Weeks   Status Partially Met   SLP LONG TERM GOAL #3   Title Patient will complete complex executive function skills tasks with 80% accuracy.   Time 8   Period Weeks   Status Partially Met   SLP LONG TERM GOAL #4   Title Patient will complete complex visual-spatial activities with 80% accuracy.  Time 8   Period Weeks   Status Partially Met          Plan - 04/11/15 1502    Clinical Impression Statement The patient continues to present with significant attention deficits that become more disruptive as the cognitive load increases.  He is able to accurately complete simple cognitive tasks with minimal external cues.  He demonstrates increased motivation for completing tasks when purpose of task is explained prior to its administration.   Speech Therapy Frequency 2x / week   Duration Other (comment)  8 weeks    Treatment/Interventions Cognitive reorganization;Internal/external aids;Functional tasks;Compensatory strategies;Patient/family education   Potential to Achieve Goals Fair   Potential Considerations Ability to learn/carryover information;Cooperation/participation level;Medical prognosis;Previous level of function;Severity of impairments;Family/community support   SLP Home Exercise Plan To be determined   Consulted and Agree with Plan of Care Patient        Problem List Patient Active Problem List   Diagnosis Date Noted  . Stroke ([place]) 10/27/2014  . Lymphadenitis 04/09/2011  . HTN (hypertension) 04/09/2011  . DM (diabetes mellitus) ([place]) 04/09/2011    Daniel [name] 04/11/2015, 3:03 PM  [place] MAIN [facility] SERVICES [address] [place], [place], [address] Phone: [phone]   Fax:  [phone]   Name: Daniel [name] MRN: [id number] Date of Birth: 10-28-1955

## 2015-04-11 NOTE — Therapy (Deleted)
[place] MAIN [facility] SERVICES [address] [place], [place], [address] Phone: [phone]   Fax:  [phone]  Occupational Therapy Treatment  Patient Details  Name: Daniel [name] MRN: [id number] Date of Birth: 12-Sep-1955 No Data Recorded  Encounter Date: 04/11/2015    Past Medical History  Diagnosis Date  . Hypertension   . Pancreatitis   . Diabetes mellitus without complication [facility])     Past Surgical History  Procedure Laterality Date  . Tee without cardioversion N/A 11/01/2014    Procedure: TRANSESOPHAGEAL ECHOCARDIOGRAM (TEE);  Surgeon: Wellington Hampshire, MD;  Location: [facility] ORS;  Service: Cardiovascular;  Laterality: N/A;    There were no vitals filed for this visit.  Visit Diagnosis:  Muscle weakness of left upper extremity  Weakness  Very limited stretching in shoulder flexion, external and internal rotation elbow flexion and extension, forearm supination and pronation, wrist flexion and extension, and hand flexion and extension. Mobilization to wrist and fingers. Weight bearing completed. Used heat during stretching.                               OT Long Term Goals - 05/18/15 1324    OT LONG TERM GOAL #1   Title Patient will increase L UE function or compensate to be able to open jars and bottles.   Baseline can now open bottles but not jars   Time 12   Period Weeks   Status Achieved   OT LONG TERM GOAL #2   Title Will be able to cut his his lawn.   Baseline Patient now able to mow lawn if wife starts mower.   Time 12   Period Weeks   Status Achieved   OT LONG TERM GOAL #3   Title will be able to cut his food with or with out assistive devices.   Baseline Patient now can cut food 50% of the time (softer foods)   Time 12   Period Weeks   Status Partially Met   OT LONG TERM GOAL #4   Title Will improve enought to possibly return to work.   Baseline Unable to return to work   Time 12   Period Weeks   Status On-going   OT LONG TERM GOAL #5   Title Will be able to dress himself with set up   Baseline Patient dresses himself    Time 12   Period Weeks   Status Achieved   OT LONG TERM GOAL #6   Title Will be able to tie shoes eather with one handed technique or 2 handed.   Baseline Pateint able to complete a one handed shoe tie.   Time 12   Period Weeks   Status Achieved   OT LONG TERM GOAL #7   Title Will control L hand edema to be equil to the right in size.   Baseline hands measure equil   Time 12   Period Weeks   Status Achieved   OT LONG TERM GOAL #8   Title Will increase L UE finger range of motion    Baseline fingers are contracte in flexion.(40%)   Time 12   Period Weeks   Status On-going               Problem List Patient Active Problem List   Diagnosis Date Noted  . Stroke ([place]) 10/27/2014  . Lymphadenitis 04/09/2011  . HTN (hypertension) 04/09/2011  .  DM (diabetes mellitus) ([place]) 04/09/2011   [name], MS/OTR/L  [name] 07/15/2015, 2:44 PM  [place] MAIN [facility] SERVICES [address] [place], [place], [address] Phone: [phone]   Fax:  [phone]  Name: Daniel [name] MRN: [id number] Date of Birth: Feb 26, 1956

## 2015-04-12 NOTE — Therapy (Signed)
[place] MAIN [facility] SERVICES [address] [place], [place], [address] Phone: [phone]   Fax:  [phone]  Physical Therapy Treatment  Patient Details  Name: Daniel [name] MRN: [id number] Date of Birth: 02-21-1956 Referring Provider: Nicky Pugh   Encounter Date: 04/11/2015      PT End of Session - 04/12/15 0915    Visit Number 18   Number of Visits 20   Date for PT Re-Evaluation 05/09/15   Authorization Type 9/10 g codes   PT Start Time 1500   PT Stop Time 1545   PT Time Calculation (min) 45 min   Equipment Utilized During Treatment Gait belt   Activity Tolerance Patient tolerated treatment well   Behavior During Therapy [facility] for tasks assessed/performed      Past Medical History  Diagnosis Date  . Hypertension   . Pancreatitis   . Diabetes mellitus without complication [facility])     Past Surgical History  Procedure Laterality Date  . Tee without cardioversion N/A 11/01/2014    Procedure: TRANSESOPHAGEAL ECHOCARDIOGRAM (TEE);  Surgeon: Wellington Hampshire, MD;  Location: [facility] ORS;  Service: Cardiovascular;  Laterality: N/A;    There were no vitals filed for this visit.  Visit Diagnosis:  Unsteadiness on feet  Weakness      Subjective Assessment - 04/12/15 0915    Subjective Pt notes he has been doing well ascending and descending steps at home and has not had any falls or near falls.  Pt was going to do some yard work yesterday and today but decided not to because he did not want to get tired.  Pt notes he has not been physically active the past few days   Patient Stated Goals pt would like to get better use of the LLE and improve his balance.    Currently in Pain? Yes   Pain Score --  no value provided   Pain Location Shoulder   Pain Orientation Left   Pain Onset More than a month ago      Neuro re-ed: In hallway- pt ambulates at normal speed finding playing cards on the left side 7ft x 1 - SBA for safety and cues  to maintain cadence    In hallway- pt ambulates going backwards while finding playing cards the left side 47ft x 1 - SBA for safety and cues to maintain cadence and increase left step length Forward ambulation while carrying quad cane in his R UE x6ftx 1 with SBA and cuing to maintain cadence Pt using left hand with green ball to target SPT hand/s on pt's left side  Pt using left hand with green ball to target SPT hand/s on pt's left side while standing on AIREX pad Stair training with verbal cues of "up with the good and down with the bad" with and without use of quad cane. Sit to stand with green ball in R hand x10 Sit to stand with yellow medicine ball in R hand x10 Left forward lunge on BOSU x10, pt required cuing to perform concentric phase quickly and eccentric phase slowly                           PT Education - 04/12/15 0915    Education provided Yes   Education Details stair negotiation without quad cane   Person(s) Educated Patient   Methods Explanation   Comprehension Verbalized understanding  PT Long Term Goals - 03/15/15 [id number]    PT LONG TERM GOAL #1   Title pt will improve berg balance score to >46 to demonstrate lower fall risk.    Baseline 34/56 on eval; 43/56 on 10/17   Time 8   Period Weeks   Status On-going   PT LONG TERM GOAL #2   Title pt will ambulate at 0.16m/s with LRAD to appropriately navigate home distances.    Baseline 0.54 m/s on 10/17   Time 8   Period Weeks   Status On-going   PT LONG TERM GOAL #3   Title pt will preform 5xsit to stand in 15 s demonstrating improve LE strength.    Baseline 28 sec on 10/17   Time 8   Period Weeks   Status On-going   PT LONG TERM GOAL #5   Title pt will be able to safely ambulate x 578ft using LRAD to negotiate short community distances.    Baseline 485 ft in 6 min on 10/17   Time 8   Period Weeks   Status On-going               Plan - 04/12/15 0916    Clinical  Impression Statement Pt demonstrates safe ability to perform stair negotiation with and without quad cane.  Pt did not require cues to attend to his left side while ambulating around the gym.  Pt required extra time to perform exercise and cueing to maintain the same pace/cadence throughout session.  Pt notes he has recently required more time to process instructions and perform activities.     Pt will benefit from skilled therapeutic intervention in order to improve on the following deficits Decreased balance;Decreased activity tolerance;Decreased endurance;Decreased mobility;Decreased strength;Increased edema;Decreased safety awareness;Decreased coordination;Difficulty walking;Decreased knowledge of use of DME   Rehab Potential Good   PT Frequency 2x / week   PT Duration 8 weeks   PT Treatment/Interventions ADLs/Self Care Home Management;Electrical Stimulation;Therapeutic exercise;Therapeutic activities;Functional mobility training;Stair training;Gait training;DME Instruction;Balance training;Neuromuscular re-education;Manual techniques;Splinting;Passive range of motion;Visual/perceptual remediation/compensation   PT Next Visit Plan dual task balance activities    Consulted and Agree with Plan of Care Patient        Problem List Patient Active Problem List   Diagnosis Date Noted  . Stroke ([place]) 10/27/2014  . Lymphadenitis 04/09/2011  . HTN (hypertension) 04/09/2011  . DM (diabetes mellitus) ([place]) 04/09/2011   [name], SPT This entire session was performed under direct supervision and direction of a licensed therapist/therapist assistant . I have personally read, edited and approve of the note as written. Gorden Harms. Tortorici, PT, DPT [phone]  Tortorici,Ashley 04/12/2015, 9:35 AM  [place] MAIN [facility] SERVICES [address] [place], [place], [address] Phone: [phone]   Fax:  [phone]  Name: Daniel [name] MRN: [id number] Date of  Birth: 21-Sep-1955

## 2015-04-13 ENCOUNTER — Encounter: Payer: Self-pay | Admitting: Speech Pathology

## 2015-04-13 ENCOUNTER — Ambulatory Visit: Payer: No Typology Code available for payment source

## 2015-04-13 ENCOUNTER — Ambulatory Visit: Payer: No Typology Code available for payment source | Admitting: Occupational Therapy

## 2015-04-13 ENCOUNTER — Ambulatory Visit: Payer: No Typology Code available for payment source | Admitting: Speech Pathology

## 2015-04-13 DIAGNOSIS — M6281 Muscle weakness (generalized): Secondary | ICD-10-CM | POA: Diagnosis not present

## 2015-04-13 DIAGNOSIS — R41841 Cognitive communication deficit: Secondary | ICD-10-CM

## 2015-04-13 DIAGNOSIS — R2681 Unsteadiness on feet: Secondary | ICD-10-CM

## 2015-04-13 DIAGNOSIS — R531 Weakness: Secondary | ICD-10-CM

## 2015-04-13 NOTE — Therapy (Signed)
[place] MAIN [facility] SERVICES [address] [place], [place], [address] Phone: [phone]   Fax:  [phone]  Speech Language Pathology Treatment  Patient Details  Name: Daniel [name] MRN: [id number] Date of Birth: 1955-06-27 No Data Recorded  Encounter Date: 04/13/2015      End of Session - 04/13/15 1608    Visit Number 15   Number of Visits 17   Date for SLP Re-Evaluation 04/24/15   SLP Start Time 1500   SLP Stop Time  1600   SLP Time Calculation (min) 60 min   Activity Tolerance Patient tolerated treatment well      Past Medical History  Diagnosis Date  . Hypertension   . Pancreatitis   . Diabetes mellitus without complication [facility])     Past Surgical History  Procedure Laterality Date  . Tee without cardioversion N/A 11/01/2014    Procedure: TRANSESOPHAGEAL ECHOCARDIOGRAM (TEE);  Surgeon: Wellington Hampshire, MD;  Location: [facility] ORS;  Service: Cardiovascular;  Laterality: N/A;    There were no vitals filed for this visit.  Visit Diagnosis: No diagnosis found.      Subjective Assessment - 04/13/15 1606    Subjective Patient reports that he is getting better every day; very distractible today.   Patient is accompained by: Family member               ADULT SLP TREATMENT - 04/13/15 0001    General Information   Behavior/Cognition Alert;Cooperative;Impulsive;Distractible;Requires cueing;Decreased sustained attention;Confused   Treatment Provided   Treatment provided Cognitive-Linquistic   Pain Assessment   Pain Assessment No/denies pain   Cognitive-Linquistic Treatment   Treatment focused on Cognition   Skilled Treatment Identify facial expressions' corresponding emotions from pictures with 50% accuracy, increasing to 100% with mod-max assistance. Answered qs about elapsed time using analog clock with 100% accuracy. Required 5 redirects today d/t poor sustained attention after exertion in PT/OT.    Assessment /  Recommendations / Plan   Plan Continue with current plan of care   Progression Toward Goals   Progression toward goals Progressing toward goals          SLP Education - 04/13/15 1607    Education provided Yes   Education Details Using CBT concepts when encountering facial expressions that make him "paranoid"   Person(s) Educated Patient   Methods Explanation   Comprehension Verbalized understanding            SLP Long Term Goals - 03/30/15 1655    SLP LONG TERM GOAL #1   Title Patient will demonstrate functional cognitive-communication skills for independent completion of personal responsibilities given mod support.   Time 8   Period Weeks   Status Partially Met   SLP LONG TERM GOAL #2   Title Patient will complete complex attention tasks with 80% accuracy.   Time 8   Period Weeks   Status Partially Met   SLP LONG TERM GOAL #3   Title Patient will complete complex executive function skills tasks with 80% accuracy.   Time 8   Period Weeks   Status Partially Met   SLP LONG TERM GOAL #4   Title Patient will complete complex visual-spatial activities with 80% accuracy.   Time 8   Period Weeks   Status Partially Met          Plan - 04/13/15 1609    Clinical Impression Statement The patient continues to have difficulty with insight into his own deficits. The patient also continues  to present with significant attention deficits that become more disruptive as the cognitive load increases.  He is able to accurately complete simple cognitive tasks with minimal external cues.  He demonstrates increased motivation for completing tasks when purpose of task is explained prior to its administration.   Speech Therapy Frequency 2x / week   Duration Other (comment)  8 weeks   Treatment/Interventions Cognitive reorganization;Internal/external aids;Functional tasks;Compensatory strategies;Patient/family education   Potential to Achieve Goals Fair   Potential Considerations Ability  to learn/carryover information;Cooperation/participation level;Medical prognosis;Previous level of function;Severity of impairments;Family/community support   SLP Home Exercise Plan To be determined   Consulted and Agree with Plan of Care Patient        Problem List Patient Active Problem List   Diagnosis Date Noted  . Stroke ([place]) 10/27/2014  . Lymphadenitis 04/09/2011  . HTN (hypertension) 04/09/2011  . DM (diabetes mellitus) ([place]) 04/09/2011    [name] 04/13/2015, 4:10 PM  [place] MAIN [facility] SERVICES [address] [place], [place], [address] Phone: [phone]   Fax:  [phone]   Name: Daniel [name] MRN: [id number] Date of Birth: [birth year]

## 2015-04-13 NOTE — Therapy (Signed)
[place] MAIN [facility] SERVICES [address] [place], [place], [address] Phone: [phone]   Fax:  [phone]  Occupational Therapy Treatment  Patient Details  Name: Daniel [name] MRN: [id number] Date of Birth: May 03, 1956 No Data Recorded  Encounter Date: 04/13/2015      OT End of Session - 04/13/15 1514    Visit Number 20   Number of Visits 24   Date for OT Re-Evaluation 05/02/15   OT Start Time 1330   OT Stop Time 1415   OT Time Calculation (min) 45 min   Equipment Utilized During Treatment moist heat to left shoulder during stretching.   Activity Tolerance Patient limited by pain      Past Medical History  Diagnosis Date  . Hypertension   . Pancreatitis   . Diabetes mellitus without complication [facility])     Past Surgical History  Procedure Laterality Date  . Tee without cardioversion N/A 11/01/2014    Procedure: TRANSESOPHAGEAL ECHOCARDIOGRAM (TEE);  Surgeon: Wellington Hampshire, MD;  Location: [facility] ORS;  Service: Cardiovascular;  Laterality: N/A;    There were no vitals filed for this visit.  Visit Diagnosis:  Muscle weakness of left upper extremity      Subjective Assessment - 04/13/15 1513    Subjective  Patient repeated that heat makes his shoulder feel better.   Pertinent History 59 year old male who suffered a CVA on October 28, 2014.    mobilization of scapula, shoulder, wrist and fingers. Patient stretched into reflex inhibiting pattern through out session to aid in normalizing tone. Cues for proper positioning and technique. Patient very painful. (note sent to physician earlier). Very pain limiting  stretching in  L upper extremity to shoulder flexion, external and internal rotation elbow flexion and extension, forearm supination and pronation, wrist flexion and extension, and hand flexion and extension.                               OT Long Term Goals - 03/09/15 1439    OT LONG TERM GOAL #1   Title Patient will increase L UE function or compensate to be able to open jars and bottles.   Baseline can now open bottles but not jars   Time 12   Period Weeks   Status Partially Met   OT LONG TERM GOAL #2   Title Will be able to cut his his lawn.   Baseline Unable to cut his grass at this time   Time 12   Period Weeks   Status On-going   OT LONG TERM GOAL #3   Title will be able to cut his food with or with out assistive devices.   Baseline Unable to cut his food   Time 12   Period Weeks   Status On-going   OT LONG TERM GOAL #4   Title Will improve enought to possibly return to work.   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   Time 12   Status Achieved   OT LONG TERM GOAL #6   Title Will be able to tie shoes eather with one handed technique or 2 handed.   Baseline now ties with one handed technique   Time  12   Status Achieved   OT LONG TERM GOAL #7   Title Will control L hand edema to be equil to the right in size.   Baseline hands measure equil   Time 12   Period Weeks   Status Achieved   OT LONG TERM GOAL #8   Title Will increase L UE finger range of motion    Baseline fingers are contracte in flexion.(40%)   Time 12   Period Weeks   Status On-going               Plan - 04/13/15 1515    Clinical Impression Statement Patient continues to have pain and tightness in L UE particularly at bicep tendon but also running down his left arm.   Pt will benefit from skilled therapeutic intervention in order to improve on the following deficits (Retired) Abnormal gait;Decreased activity tolerance;Decreased balance;Decreased coordination;Decreased endurance;Decreased knowledge of use of DME;Decreased mobility;Decreased range of motion;Decreased strength;Difficulty walking;Increased edema;Impaired flexibility;Impaired  sensation;Impaired tone;Impaired UE functional use   OT Treatment/Interventions Self-care/ADL training;Electrical Stimulation;DME and/or AE instruction;Energy conservation;Neuromuscular education;Therapeutic exercise;Functional Mobility Training;Manual Therapy;Passive range of motion;Splinting;Therapeutic exercises;Therapeutic activities        Problem List Patient Active Problem List   Diagnosis Date Noted  . Stroke ([place]) 10/27/2014  . Lymphadenitis 04/09/2011  . HTN (hypertension) 04/09/2011  . DM (diabetes mellitus) ([place]) 04/09/2011   [name], MS/OTR/L  [name] 04/13/2015, 3:20 PM  [place] MAIN [facility] SERVICES [address] [place], [place], [address] Phone: [phone]   Fax:  [phone]  Name: Daniel [name] MRN: [id number] Date of Birth: 05-Nov-1955

## 2015-04-13 NOTE — Patient Instructions (Signed)
To keep using heat at home

## 2015-04-13 NOTE — Therapy (Signed)
[place] MAIN [facility] SERVICES [address] [place], [place], [address] Phone: [phone]   Fax:  [phone]  Physical Therapy Treatment Physical Therapy Progress Note 03/15/15 to 04/13/15  Patient Details  Name: Daniel [name] MRN: [id number] Date of Birth: 1956/02/27 Referring Provider: Nicky Pugh   Encounter Date: 04/13/2015      PT End of Session - 04/13/15 1517    Visit Number 19   Number of Visits 20   Date for PT Re-Evaluation 05/09/15   Authorization Type 1/10   PT Start Time 1415   PT Stop Time 1500   PT Time Calculation (min) 45 min   Equipment Utilized During Treatment Gait belt   Activity Tolerance Patient tolerated treatment well   Behavior During Therapy [facility] for tasks assessed/performed      Past Medical History  Diagnosis Date  . Hypertension   . Pancreatitis   . Diabetes mellitus without complication [facility])     Past Surgical History  Procedure Laterality Date  . Tee without cardioversion N/A 11/01/2014    Procedure: TRANSESOPHAGEAL ECHOCARDIOGRAM (TEE);  Surgeon: Wellington Hampshire, MD;  Location: [facility] ORS;  Service: Cardiovascular;  Laterality: N/A;    There were no vitals filed for this visit.  Visit Diagnosis:  Weakness - Plan: PT plan of care cert/re-cert  Unsteadiness on feet - Plan: PT plan of care cert/re-cert      Subjective Assessment - 04/13/15 1515    Subjective pt not wearing shoes today- wearing slippers. reports he is having trouble getting his shoes on - PT reports to OT.    Patient Stated Goals pt would like to get better use of the LLE and improve his balance.    Currently in Pain? Yes   Pain Score --  5/10 L shoulder   Pain Onset More than a month ago     Gait training: In hallway finding cards x 38ft- cues for speed and attention to L wall, SBA for safety with quad cane In hallway retro ambulation finding playing cards x 31ft cues for step length In hallway stop/go commands x 63ft  cues for step length Around clinic- "scavenger hunt" to find cones in clinic- SBA for safety, cues for inc. Gait speed  NMR Resisted walking with matrix- 12.5lbs fwd x 3 laps  Retro 7.5lbs x 2 laps Pt needed cues for weight shift, step length, and CGA for safety On [employer] reaching outside BOS to SAEBO x  7 min - SBA for safety                            PT Education - 04/12/15 0915    Education provided Yes   Education Details stair negotiation without quad cane   Person(s) Educated Patient   Methods Explanation   Comprehension Verbalized understanding             PT Long Term Goals - 04/13/15 1521    PT LONG TERM GOAL #1   Title pt will improve berg balance score to >46 to demonstrate lower fall risk.    Baseline 34/56 on eval; 43/56 on 10/17   Time 8   Period Weeks   Status On-going   PT LONG TERM GOAL #2   Title pt will ambulate at 0.73m/s with LRAD to appropriately navigate home distances.    Baseline 0.54 m/s on 10/17   Time 8   Period Weeks   Status On-going  PT LONG TERM GOAL #3   Title pt will preform 5xsit to stand in 15 s demonstrating improve LE strength.    Baseline 28 sec on 10/17   Time 8   Period Weeks   Status On-going   PT LONG TERM GOAL #5   Title pt will be able to safely ambulate x 528ft using LRAD to negotiate short community distances.    Baseline 485 ft in 6 min on 10/17   Time 8   Period Weeks   Status On-going               Plan - 04-14-2015 1517    Clinical Impression Statement pt demonstrates improved ability to scan his envoirnment which improves safety with ambulation. pt does need extra time and has slowed gait speed to accomplish multi-tasking. pt was challenged with resisted walking as well needing guard and cues. pt would benefit from continued skilled PT services to improve gait speed, walking efficency, multitasking to promote maximal independence and progress towards goals.    Pt will benefit from  skilled therapeutic intervention in order to improve on the following deficits Decreased balance;Decreased activity tolerance;Decreased endurance;Decreased mobility;Decreased strength;Increased edema;Decreased safety awareness;Decreased coordination;Difficulty walking;Decreased knowledge of use of DME   Rehab Potential Good   PT Frequency 2x / week   PT Duration 8 weeks   PT Treatment/Interventions ADLs/Self Care Home Management;Electrical Stimulation;Therapeutic exercise;Therapeutic activities;Functional mobility training;Stair training;Gait training;DME Instruction;Balance training;Neuromuscular re-education;Manual techniques;Splinting;Passive range of motion;Visual/perceptual remediation/compensation   PT Next Visit Plan outcome measures    Consulted and Agree with Plan of Care Patient          G-Codes - 2015/04/14 1523    Functional Assessment Tool Used clinical judgement, history, outcome measures    Functional Limitation Mobility: Walking and moving around   Mobility: Walking and Moving Around Current Status [id number]) At least 20 percent but less than 40 percent impaired, limited or restricted   Mobility: Walking and Moving Around Goal Status [id number]) At least 1 percent but less than 20 percent impaired, limited or restricted      Problem List Patient Active Problem List   Diagnosis Date Noted  . Stroke ([place]) 10/27/2014  . Lymphadenitis 04/09/2011  . HTN (hypertension) 04/09/2011  . DM (diabetes mellitus) ([place]) 04/09/2011   Gorden Harms. Dina Warbington, PT, DPT [phone]'  Sharla Tankard 2015-04-14, 3:24 PM  [place] MAIN [facility] SERVICES [address] [place], [place], [address] Phone: [phone]   Fax:  [phone]  Name: Daniel [name] MRN: [id number] Date of Birth: [birth year]

## 2015-04-18 ENCOUNTER — Ambulatory Visit: Payer: No Typology Code available for payment source | Admitting: Speech Pathology

## 2015-04-18 ENCOUNTER — Encounter: Payer: Self-pay | Admitting: Speech Pathology

## 2015-04-18 ENCOUNTER — Ambulatory Visit: Payer: No Typology Code available for payment source | Admitting: Occupational Therapy

## 2015-04-18 ENCOUNTER — Ambulatory Visit: Payer: No Typology Code available for payment source

## 2015-04-18 ENCOUNTER — Encounter: Payer: Self-pay | Admitting: Occupational Therapy

## 2015-04-18 DIAGNOSIS — R531 Weakness: Secondary | ICD-10-CM

## 2015-04-18 DIAGNOSIS — M6281 Muscle weakness (generalized): Secondary | ICD-10-CM | POA: Diagnosis not present

## 2015-04-18 DIAGNOSIS — R2681 Unsteadiness on feet: Secondary | ICD-10-CM

## 2015-04-18 DIAGNOSIS — R41841 Cognitive communication deficit: Secondary | ICD-10-CM

## 2015-04-18 NOTE — Therapy (Signed)
[place] MAIN [facility] SERVICES [address] [place], [place], [address] Phone: [phone]   Fax:  [phone]  Physical Therapy Treatment  Patient Details  Name: Daniel [name] MRN: [id number] Date of Birth: [birth year] Referring Provider: Nicky Pugh   Encounter Date: 04/18/2015      PT End of Session - 04/18/15 1758    Visit Number 20   Number of Visits 20   Date for PT Re-Evaluation 05/11/15   Authorization Type 2/10   PT Start Time [id number]   PT Stop Time 1500   PT Time Calculation (min) 45 min   Equipment Utilized During Treatment Gait belt   Activity Tolerance Patient tolerated treatment well   Behavior During Therapy [facility] for tasks assessed/performed      Past Medical History  Diagnosis Date  . Hypertension   . Pancreatitis   . Diabetes mellitus without complication [facility])     Past Surgical History  Procedure Laterality Date  . Tee without cardioversion N/A 11/01/2014    Procedure: TRANSESOPHAGEAL ECHOCARDIOGRAM (TEE);  Surgeon: Wellington Hampshire, MD;  Location: [facility] ORS;  Service: Cardiovascular;  Laterality: N/A;    There were no vitals filed for this visit.  Visit Diagnosis:  Unsteadiness on feet  Weakness      Subjective Assessment - 04/18/15 1755    Subjective pt reports hes biggest problem is speech therapy. he has trouble with his memory    Patient Stated Goals pt would like to get better use of the LLE and improve his balance.    Currently in Pain? Yes   Pain Score 5    Pain Location --  L shoulder   Pain Onset More than a month ago        Nustep x 5 min L 3 no charge  Fwd/ side and retro mini lunges- no UE, CGA for safety  x10 each leg with max verbal cues and some visual cues Fwd step up with no UE x 10 each leg - 6in step mod verbal cues 1 foot on step / 1 foot on floor with trunk turns 3x10 with mod cues to stay on task Toe taps on 6in step - no UE support 3x10 Pt required quite a bit of  redirection to stay on task , CGA for safety                              PT Long Term Goals - 04/13/15 1521    PT LONG TERM GOAL #1   Title pt will improve berg balance score to >46 to demonstrate lower fall risk.    Baseline 34/56 on eval; 43/56 on 10/17   Time 8   Period Weeks   Status On-going   PT LONG TERM GOAL #2   Title pt will ambulate at 0.46m/s with LRAD to appropriately navigate home distances.    Baseline 0.54 m/s on 10/17   Time 8   Period Weeks   Status On-going   PT LONG TERM GOAL #3   Title pt will preform 5xsit to stand in 15 s demonstrating improve LE strength.    Baseline 28 sec on 10/17   Time 8   Period Weeks   Status On-going   PT LONG TERM GOAL #5   Title pt will be able to safely ambulate x 521ft using LRAD to negotiate short community distances.    Baseline 485 ft in 6 min on  10/17   Time 8   Period Weeks   Status On-going               Plan - 04/18/15 1758    Clinical Impression Statement pt had more difficutly staying directed onto task and requried more cues for exercise performance today. he has some continued difficulty with foot placement , but did well with progression of NMR today.    Pt will benefit from skilled therapeutic intervention in order to improve on the following deficits Decreased balance;Decreased activity tolerance;Decreased endurance;Decreased mobility;Decreased strength;Increased edema;Decreased safety awareness;Decreased coordination;Difficulty walking;Decreased knowledge of use of DME   Rehab Potential Good   PT Frequency 2x / week   PT Duration 8 weeks   PT Treatment/Interventions ADLs/Self Care Home Management;Electrical Stimulation;Therapeutic exercise;Therapeutic activities;Functional mobility training;Stair training;Gait training;DME Instruction;Balance training;Neuromuscular re-education;Manual techniques;Splinting;Passive range of motion;Visual/perceptual remediation/compensation   PT Next  Visit Plan outcome measures    Consulted and Agree with Plan of Care Patient        Problem List Patient Active Problem List   Diagnosis Date Noted  . Stroke ([place]) 10/27/2014  . Lymphadenitis 04/09/2011  . HTN (hypertension) 04/09/2011  . DM (diabetes mellitus) ([place]) 04/09/2011   Daniel [name], PT, DPT [phone]  Daniel [name] 04/18/2015, 6:01 PM  [place] MAIN [facility] SERVICES [address] [place], [place], [address] Phone: [phone]   Fax:  [phone]  Name: Daniel [name] MRN: [id number] Date of Birth: 1956-04-14

## 2015-04-18 NOTE — Therapy (Signed)
[place] MAIN [facility] SERVICES [address] [place], [place], [address] Phone: [phone]   Fax:  [phone]  Speech Language Pathology Treatment  Patient Details  Name: Daniel [name] MRN: [id number] Date of Birth: 02-24-1956 No Data Recorded  Encounter Date: 04/18/2015      End of Session - 04/18/15 1642    Visit Number 16   Number of Visits 17   Date for SLP Re-Evaluation 04/24/15   SLP Start Time 1501   SLP Stop Time  8588   SLP Time Calculation (min) 54 min   Activity Tolerance Patient tolerated treatment well      Past Medical History  Diagnosis Date  . Hypertension   . Pancreatitis   . Diabetes mellitus without complication [facility])     Past Surgical History  Procedure Laterality Date  . Tee without cardioversion N/A 11/01/2014    Procedure: TRANSESOPHAGEAL ECHOCARDIOGRAM (TEE);  Surgeon: Wellington Hampshire, MD;  Location: [facility] ORS;  Service: Cardiovascular;  Laterality: N/A;    There were no vitals filed for this visit.  Visit Diagnosis: No diagnosis found.      Subjective Assessment - 04/18/15 1641    Subjective Patient reports worrying that he is going to have another stroke frequently.   Patient is accompained by: Family member   Currently in Pain? No/denies               ADULT SLP TREATMENT - 04/18/15 0001    General Information   Behavior/Cognition Alert;Cooperative;Impulsive;Distractible;Requires cueing;Decreased sustained attention;Confused   Treatment Provided   Treatment provided Cognitive-Linquistic   Pain Assessment   Pain Assessment No/denies pain   Cognitive-Linquistic Treatment   Treatment focused on Cognition   Skilled Treatment Identify facial expressions' corresponding emotions from pictures with 60% accuracy, increasing to 100% with mod-max assistance. Generated 2 strategies for calming himself when anxious with 100% independence given clinician model.   Assessment / Recommendations /  Plan   Plan Continue with current plan of care   Progression Toward Goals   Progression toward goals Progressing toward goals          SLP Education - 04/18/15 1641    Education provided Yes   Education Details Emotions with corresponding facial expressions; theory of mind concepts; what to do when intent is misunderstood   Person(s) Educated Patient   Methods Explanation   Comprehension Verbalized understanding;Need further instruction            SLP Long Term Goals - 03/30/15 1655    SLP LONG TERM GOAL #1   Title Patient will demonstrate functional cognitive-communication skills for independent completion of personal responsibilities given mod support.   Time 8   Period Weeks   Status Partially Met   SLP LONG TERM GOAL #2   Title Patient will complete complex attention tasks with 80% accuracy.   Time 8   Period Weeks   Status Partially Met   SLP LONG TERM GOAL #3   Title Patient will complete complex executive function skills tasks with 80% accuracy.   Time 8   Period Weeks   Status Partially Met   SLP LONG TERM GOAL #4   Title Patient will complete complex visual-spatial activities with 80% accuracy.   Time 8   Period Weeks   Status Partially Met          Plan - 04/18/15 1643    Clinical Impression Statement The patient continues to have difficulty with insight into his own deficits. The  patient also continues to present with significant attention deficits that become more disruptive as the cognitive load increases.  He is able to accurately complete simple cognitive tasks with minimal external cues.  He demonstrates increased motivation for completing tasks when purpose of task is explained prior to its administration. The patient is approaching the final session that will be covered by insurance; this was addressed in therapy today.   Speech Therapy Frequency 2x / week   Duration Other (comment)  8 weeks   Treatment/Interventions Cognitive  reorganization;Internal/external aids;Functional tasks;Compensatory strategies;Patient/family education   Potential to Achieve Goals Fair   Potential Considerations Ability to learn/carryover information;Cooperation/participation level;Medical prognosis;Previous level of function;Severity of impairments;Family/community support   SLP Home Exercise Plan To be determined   Consulted and Agree with Plan of Care Patient        Problem List Patient Active Problem List   Diagnosis Date Noted  . Stroke ([place]) 10/27/2014  . Lymphadenitis 04/09/2011  . HTN (hypertension) 04/09/2011  . DM (diabetes mellitus) ([place]) 04/09/2011    Daniel [name] 04/18/2015, 4:45 PM  [place] MAIN [facility] SERVICES [address] [place], [place], [address] Phone: [phone]   Fax:  [phone]   Name: Daniel [name] MRN: [id number] Date of Birth: 1955/07/14

## 2015-04-19 NOTE — Therapy (Signed)
[place] [facility] MAIN REHAB SERVICES [address] [place], [place], [address] Phone: [phone]   Fax:  [phone]  Occupational Therapy Treatment  Patient Details  Name: Daniel [name] MRN: [id number] Date of Birth: 04/05/1956 No Data Recorded  Encounter Date: 04/18/2015      OT End of Session - 04/18/15 1652    Visit Number 21   Number of Visits 24   Date for OT Re-Evaluation 05/02/15   OT Start Time 1320   OT Stop Time 1414   OT Time Calculation (min) 54 min   Equipment Utilized During Treatment moist heat to left shoulder prior to tx   Activity Tolerance Patient limited by pain   Behavior During Therapy WFL for tasks assessed/performed      Past Medical History  Diagnosis Date  . Hypertension   . Pancreatitis   . Diabetes mellitus without complication ([facility])     Past Surgical History  Procedure Laterality Date  . Tee without cardioversion N/A 11/01/2014    Procedure: TRANSESOPHAGEAL ECHOCARDIOGRAM (TEE);  Surgeon: Muhammad A Arida, MD;  Location: [facility] ORS;  Service: Cardiovascular;  Laterality: N/A;    There were no vitals filed for this visit.  Visit Diagnosis:  Muscle weakness of left upper extremity      Subjective Assessment - 04/18/15 1346    Subjective  It helps alot to warm up my shoulder before we work.  Reports at rest arm doesnt hurt but cool air makes it feel achy like.   Pertinent History 59 year old male who suffered a CVA on October 28, 2014.   Patient Stated Goals To get my left side working, be able to take care of myself.                      OT Treatments/Exercises (OP) - 04/18/15 1909    Neurological Re-education Exercises   Other Exercises 1 Patient was seen for PROM of LUE in supine with mobilization to left shoulder joint for posterior and inferior glides, grade II as well as mobilization to the left scapula for elevation, depression and upwards rotation to decrease pain and increase motion  performed in sidelying. PROM to LUE for shoulder flexion, ABD, ER, elbow flexion/extension, supination, wrist extension and finger flexion/extension with prolonged stretch as needed as well as distractors. AAROM with facilitation attempted for all motions, patient limited by pain.    Other Exercises 2 Mobilization of the left scapula for ABD, ADD, depression and upwards rotation to decrease pain and increase ROM.                OT Education - 04/18/15 1651    Education provided Yes   Education Details HEP for ROM, self care   Person(s) Educated Patient   Methods Explanation;Demonstration;Verbal cues   Comprehension Verbal cues required;Returned demonstration;Verbalized understanding             OT Long Term Goals - 03/09/15 1439    OT LONG TERM GOAL #1   Title Patient will increase L UE function or compensate to be able to open jars and bottles.   Baseline can now open bottles but not jars   Time 12   Period Weeks   Status Partially Met   OT LONG TERM GOAL #2   Title Will be able to cut his his lawn.   Baseline Unable to cut his grass at this time   Time 12   Period Weeks   Status On-going     OT LONG TERM GOAL #3   Title will be able to cut his food with or with out assistive devices.   Baseline Unable to cut his food   Time 12   Period Weeks   Status On-going   OT LONG TERM GOAL #4   Title Will improve enought to possibly return to work.   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   Time 12   Status Achieved   OT LONG TERM GOAL #6   Title Will be able to tie shoes eather with one handed technique or 2 handed.   Baseline now ties with one handed technique   Time 12   Status Achieved   OT LONG TERM GOAL #7   Title Will control L hand edema to be equil to the right in size.   Baseline hands measure equil    Time 12   Period Weeks   Status Achieved   OT LONG TERM GOAL #8   Title Will increase L UE finger range of motion    Baseline fingers are contracte in flexion.(40%)   Time 12   Period Weeks   Status On-going               Plan - 04/18/15 1654    Clinical Impression Statement Patient continues to demonstrate increased pain with attempts at range of motion of the left arm.  He responds well to mobilizations of the left scapula followed by gentle, prolonged passive range of motion followed by active assistive ROM of the left UE in all joints, within pain limitations.  Continue focus on facilitation of active movements of the left UE to use as a gross assist.   Pt will benefit from skilled therapeutic intervention in order to improve on the following deficits (Retired) Abnormal gait;Decreased activity tolerance;Decreased balance;Decreased coordination;Decreased endurance;Decreased knowledge of use of DME;Decreased mobility;Decreased range of motion;Decreased strength;Difficulty walking;Increased edema;Impaired flexibility;Impaired sensation;Impaired tone;Impaired UE functional use   Rehab Potential Good   OT Frequency 2x / week   OT Duration 12 weeks   OT Treatment/Interventions Self-care/ADL training;Electrical Stimulation;DME and/or AE instruction;Energy conservation;Neuromuscular education;Therapeutic exercise;Functional Mobility Training;Manual Therapy;Passive range of motion;Splinting;Therapeutic exercises;Therapeutic activities   Consulted and Agree with Plan of Care Patient        Problem List Patient Active Problem List   Diagnosis Date Noted  . Stroke ([facility]) 10/27/2014  . Lymphadenitis 04/09/2011  . HTN (hypertension) 04/09/2011  . DM (diabetes mellitus) ([facility]) 04/09/2011   Amy T Lovett, OTR/L, CLT  Lovett,Amy 04/19/2015, 7:17 PM  [place] [facility] MAIN REHAB SERVICES [address] [place], [place], [address] Phone: [phone]    Fax:  [phone]  Name: Pike L Bublitz MRN: [id number] Date of Birth: 07/15/1955   

## 2015-04-20 ENCOUNTER — Ambulatory Visit: Payer: No Typology Code available for payment source | Admitting: Occupational Therapy

## 2015-04-20 ENCOUNTER — Ambulatory Visit: Payer: No Typology Code available for payment source | Admitting: Speech Pathology

## 2015-04-20 ENCOUNTER — Ambulatory Visit: Payer: No Typology Code available for payment source

## 2015-04-25 ENCOUNTER — Ambulatory Visit: Payer: No Typology Code available for payment source | Admitting: Occupational Therapy

## 2015-04-25 ENCOUNTER — Encounter: Payer: No Typology Code available for payment source | Admitting: Speech Pathology

## 2015-04-25 ENCOUNTER — Ambulatory Visit: Payer: No Typology Code available for payment source

## 2015-04-25 DIAGNOSIS — M6281 Muscle weakness (generalized): Secondary | ICD-10-CM | POA: Diagnosis not present

## 2015-04-25 DIAGNOSIS — R531 Weakness: Secondary | ICD-10-CM

## 2015-04-25 DIAGNOSIS — R2681 Unsteadiness on feet: Secondary | ICD-10-CM
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
## 2015-04-25 NOTE — Therapy (Signed)
[place] MAIN [facility] SERVICES [address] [place], [place], [address] Phone: [phone]   Fax:  [phone]  Physical Therapy Treatment  Patient Details  Name: Daniel [name] MRN: [id number] Date of Birth: 05-03-1956 Referring Provider: Nicky [name]   Encounter Date: 04/25/2015      PT End of Session - 04/25/15 1513    Visit Number 21   Number of Visits 28   Date for PT Re-Evaluation 05/23/15   Authorization Type 3/10   PT Start Time 1415   PT Stop Time 1500   PT Time Calculation (min) 45 min   Equipment Utilized During Treatment Gait belt   Activity Tolerance Patient tolerated treatment well   Behavior During Therapy [facility] for tasks assessed/performed      Past Medical History  Diagnosis Date  . Hypertension   . Pancreatitis   . Diabetes mellitus without complication [facility])     Past Surgical History  Procedure Laterality Date  . Tee without cardioversion N/A 11/01/2014    Procedure: TRANSESOPHAGEAL ECHOCARDIOGRAM (TEE);  Surgeon: Daniel Hampshire, MD;  Location: [facility] ORS;  Service: Cardiovascular;  Laterality: N/A;    There were no vitals filed for this visit.  Visit Diagnosis:  Weakness - Plan: PT plan of care cert/re-cert  Unsteadiness on feet - Plan: PT plan of care cert/re-cert      Subjective Assessment - 04/25/15 1511    Subjective pt reports he is having trouble getting off the commode. He is pulling hard on the sink    Patient Stated Goals pt would like to get better use of the LLE and improve his balance.    Currently in Pain? Yes   Pain Score 5    Pain Location --  L shoulder   Pain Onset More than a month ago       Therex: Nustep L 4 x 5 min no charge Leg press 2 plates LLE only 624THL, min assist needed for LE alignment Knee extension 2 plates 3x10 LLE only Hamstring curls 4 plates 3x10  sit to stand from elevated mat table RLE elevated no UE 3x5 Progressed to low mat table - feet even using quad cane  2x5 Pt requires mod cues for momentum for sit to stand and more use of LLE during sit to stand  Wife educated on sit to stand as well                          PT Education - 04/25/15 1512    Education provided Yes   Education Details sit to stand with more momentum, more use of LLE    Person(s) Educated Patient   Methods Explanation   Comprehension Verbalized understanding             PT Long Term Goals - 04/13/15 1521    PT LONG TERM GOAL #1   Title pt will improve berg balance score to >46 to demonstrate lower fall risk.    Baseline 34/56 on eval; 43/56 on 10/17   Time 8   Period Weeks   Status On-going   PT LONG TERM GOAL #2   Title pt will ambulate at 0.24m/s with LRAD to appropriately navigate home distances.    Baseline 0.54 m/s on 10/17   Time 8   Period Weeks   Status On-going   PT LONG TERM GOAL #3   Title pt will preform 5xsit to stand in 15 s demonstrating improve  LE strength.    Baseline 28 sec on 10/17   Time 8   Period Weeks   Status On-going   PT LONG TERM GOAL #5   Title pt will be able to safely ambulate x 572ft using LRAD to negotiate short community distances.    Baseline 485 ft in 6 min on 10/17   Time 8   Period Weeks   Status On-going               Plan - 04/25/15 1513    Clinical Impression Statement pt demonstrated good carry over of verbal cues and exercise performance in order to get off the commode better, using more LLE strength and less reliance on RUE . pt would benefit from continued skilled PT services to maximize independence and mobility.    Pt will benefit from skilled therapeutic intervention in order to improve on the following deficits Decreased balance;Decreased activity tolerance;Decreased endurance;Decreased mobility;Decreased strength;Increased edema;Decreased safety awareness;Decreased coordination;Difficulty walking;Decreased knowledge of use of DME   Rehab Potential Good   PT Frequency 2x / week    PT Duration 4 weeks   PT Treatment/Interventions ADLs/Self Care Home Management;Electrical Stimulation;Therapeutic exercise;Therapeutic activities;Functional mobility training;Stair training;Gait training;DME Instruction;Balance training;Neuromuscular re-education;Manual techniques;Splinting;Passive range of motion;Visual/perceptual remediation/compensation   PT Next Visit Plan outcome measures    Consulted and Agree with Plan of Care Patient        Problem List Patient Active Problem List   Diagnosis Date Noted  . Stroke ([place]) 10/27/2014  . Lymphadenitis 04/09/2011  . HTN (hypertension) 04/09/2011  . DM (diabetes mellitus) ([place]) 04/09/2011   Daniel [name], PT, DPT [phone]  Daniel [name] 04/25/2015, 3:18 PM  [place] MAIN [facility] SERVICES [address] [place], [place], [address] Phone: [phone]   Fax:  [phone]  Name: Daniel [name] MRN: [id number] Date of Birth: [birth year]

## 2015-04-27 ENCOUNTER — Encounter: Payer: No Typology Code available for payment source | Admitting: Speech Pathology

## 2015-04-27 ENCOUNTER — Ambulatory Visit: Payer: No Typology Code available for payment source

## 2015-04-27 ENCOUNTER — Ambulatory Visit: Payer: No Typology Code available for payment source | Admitting: Occupational Therapy

## 2015-04-27 ENCOUNTER — Encounter: Payer: Self-pay | Admitting: Occupational Therapy

## 2015-04-27 DIAGNOSIS — M6281 Muscle weakness (generalized): Secondary | ICD-10-CM

## 2015-04-27 DIAGNOSIS — R531 Weakness: Secondary | ICD-10-CM

## 2015-04-27 DIAGNOSIS — R2681 Unsteadiness on feet: Secondary | ICD-10-CM

## 2015-04-27 NOTE — Therapy (Signed)
[place] MAIN [facility] SERVICES [address] [place], [place], [address] Phone: [phone]   Fax:  [phone]  Occupational Therapy Treatment/Progress Note  Patient Details  Name: Daniel [name] MRN: [id number] Date of Birth: 1956-01-03 No Data Recorded  Encounter Date: 04/27/2015      OT End of Session - 04/27/15 1340    Visit Number 23   Number of Visits 24   Date for OT Re-Evaluation 05/02/15   OT Start Time 1330   OT Stop Time 1415   OT Time Calculation (min) 45 min      Past Medical History  Diagnosis Date  . Hypertension   . Pancreatitis   . Diabetes mellitus without complication [facility])     Past Surgical History  Procedure Laterality Date  . Tee without cardioversion N/A 11/01/2014    Procedure: TRANSESOPHAGEAL ECHOCARDIOGRAM (TEE);  Surgeon: Wellington Hampshire, MD;  Location: [facility] ORS;  Service: Cardiovascular;  Laterality: N/A;    There were no vitals filed for this visit.  Visit Diagnosis:  Muscle weakness of left upper extremity      Subjective Assessment - 04/27/15 1338    Subjective  I see the doctor next week.   Pertinent History 59 year old male who suffered a CVA on October 28, 2014.    L UE measurements - Passive stretching to pain shoulder flexion to 70o, elbow extension -15o, flexion 115o, wrist supination, 50o, wrist extension 22o, finger. PIP extension finger 2 -26o, finger 3. -36o, finger 4. -44o, finger 5. -56o. Passive stretching in left upper extremity  shoulder flexion, external and internal rotation elbow flexion and extension, forearm supination and pronation, wrist flexion and extension, and hand flexion and extension.   Patient can now open jars with one hand using dycem. 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 He can cut soft foods, but still needs assist with tougher cues. He has not returned to work  (goal 4).                               OT Long Term Goals - 04/27/15 1343    OT LONG TERM GOAL #1   Status Achieved   OT LONG TERM GOAL #2   Status Achieved   OT LONG TERM GOAL #3   Status Partially Met   OT LONG TERM GOAL #4   Status On-going   OT LONG TERM GOAL #5   Status Achieved   OT LONG TERM GOAL #6   Status Achieved   OT LONG TERM GOAL #7   Status Achieved   OT LONG TERM GOAL #8   Status On-going               Plan - 04/27/15 1342    Clinical Impression Statement Patient continues to have L UE pain with movement.  Patient has achieved 4 out of 7 goals. Patient's continual need for higher level activities of daily living and to address L UE deficits and pain, patient would benefit from continued Occupational Therapy.   Pt will benefit from skilled therapeutic intervention in order to improve on the following deficits (Retired) Abnormal gait;Decreased activity tolerance;Decreased balance;Decreased coordination;Decreased endurance;Decreased knowledge of use of DME;Decreased mobility;Decreased range of motion;Decreased strength;Difficulty walking;Increased edema;Impaired flexibility;Impaired sensation;Impaired tone;Impaired UE functional use   OT Treatment/Interventions Self-care/ADL training;Electrical Stimulation;DME and/or  AE instruction;Energy conservation;Neuromuscular education;Therapeutic exercise;Functional Mobility Training;Manual Therapy;Passive range of motion;Splinting;Therapeutic exercises;Therapeutic activities        Problem List Patient Active Problem List   Diagnosis Date Noted  . Stroke ([place]) 10/27/2014  . Lymphadenitis 04/09/2011  . HTN (hypertension) 04/09/2011  . DM (diabetes mellitus) ([place]) 04/09/2011   [name], MS/OTR/L  [name] 04/27/2015, 2:15 PM  [place] MAIN [facility] SERVICES [address] [place], [place], [address] Phone: [phone]   Fax:   [phone]  Name: Daniel [name] MRN: [id number] Date of Birth: 1956/02/22

## 2015-04-27 NOTE — Therapy (Signed)
[place] MAIN [facility] SERVICES [address] [place], [place], [address] Phone: [phone]   Fax:  [phone]  Physical Therapy Treatment [place] summary  Patient Details  Name: Daniel [name] MRN: [id number] Date of Birth: 1955/10/03 Referring Provider: Nicky Pugh   Encounter Date: 04/27/2015      PT End of Session - 04/27/15 1624    Visit Number 22   Number of Visits 28   Date for PT Re-Evaluation 05/23/15   Authorization Type 4/10   PT Start Time 9292   PT Stop Time 1445   PT Time Calculation (min) 30 min   Equipment Utilized During Treatment Gait belt   Activity Tolerance Patient tolerated treatment well   Behavior During Therapy [facility] for tasks assessed/performed      Past Medical History  Diagnosis Date  . Hypertension   . Pancreatitis   . Diabetes mellitus without complication [facility])     Past Surgical History  Procedure Laterality Date  . Tee without cardioversion N/A 11/01/2014    Procedure: TRANSESOPHAGEAL ECHOCARDIOGRAM (TEE);  Surgeon: Wellington Hampshire, MD;  Location: [facility] ORS;  Service: Cardiovascular;  Laterality: N/A;    There were no vitals filed for this visit.  Visit Diagnosis:  Unsteadiness on feet  Weakness      Subjective Assessment - 04/27/15 1622    Subjective pt reports he put what he learned last session into action and was able to get off the commode much better.    Patient Stated Goals pt would like to get better use of the LLE and improve his balance.    Currently in Pain? Yes   Pain Score 5    Pain Location --  shoulder (Right)   Pain Onset More than a month ago      PT reassessed outcome measures and progress towards goals :      [facility] PT Assessment - 04/27/15 0001    Standardized Balance Assessment   Standardized Balance Assessment Berg Balance Test;Five Times Sit to Stand;10 meter walk test   Five times sit to stand comments  20s   10 Meter Walk 0.61   Berg Balance Test   Sit  to Stand Able to stand without using hands and stabilize independently   Standing Unsupported Able to stand safely 2 minutes   Sitting with Back Unsupported but Feet Supported on Floor or Stool Able to sit safely and securely 2 minutes   Stand to Sit Sits safely with minimal use of hands   Transfers Able to transfer safely, definite need of hands   Standing Unsupported with Eyes Closed Able to stand 10 seconds with supervision   Standing Ubsupported with Feet Together Able to place feet together independently and stand for 1 minute with supervision   From Standing, Reach Forward with Outstretched Arm Can reach forward >5 cm safely (2")   From Standing Position, Pick up Object from Floor Able to pick up shoe, needs supervision   From Standing Position, Turn to Look Behind Over each Shoulder Looks behind one side only/other side shows less weight shift   Turn 360 Degrees Needs close supervision or verbal cueing   Standing Unsupported, Alternately Place Feet on Step/Stool Able to complete 4 steps without aid or supervision   Standing Unsupported, One Foot in Front Able to plae foot ahead of the other independently and hold 30 seconds   Standing on One Leg Able to lift leg independently and hold 5-10 seconds   Total Score 42  6 min walk test 59f                         PT Education - 112/22/161624    Education provided Yes   Education Details plan of care, reason for DC , continue HEP   Person(s) Educated Patient   Methods Explanation   Comprehension Verbalized understanding             PT Long Term Goals - 1December 22, 20161626    PT LONG TERM GOAL #1   Title pt will improve berg balance score to >46 to demonstrate lower fall risk.    Baseline 34/56 on eval; 43/56 on 10/17 42/56 122-Dec-2016  Time 8   Period Weeks   Status Partially Met   PT LONG TERM GOAL #2   Title pt will ambulate at 0.858m with LRAD to appropriately navigate home distances.    Baseline 0.54  m/s on 10/17, 0.6 1112/22/2024 Time 8   Period Weeks   Status Partially Met   PT LONG TERM GOAL #3   Title pt will preform 5xsit to stand in 15 s demonstrating improve LE strength.    Baseline 28 sec on 10/17 20s 1112/22/16 Time 8   Period Weeks   Status Partially Met   PT LONG TERM GOAL #5   Title pt will be able to safely ambulate x 50059fsing LRAD to negotiate short community distances.    Baseline 485 ft in 6 min on 10/17   Time 8   Period Weeks   Status Achieved               Plan - 11/Dec 22, 201625    Clinical Impression Statement PT reassessed outcome measures and goals today. pt has made some, but minimal progress towards goals and with functional outcome measures since 03/14/15, but great progress since initializing therapy at our clinic. pt will be DC to HEP at this time as he has not made significant progress since last recheck, however PT urged wife and pt to return if pt functional mobility declines.    Pt will benefit from skilled therapeutic intervention in order to improve on the following deficits Decreased balance;Decreased activity tolerance;Decreased endurance;Decreased mobility;Decreased strength;Increased edema;Decreased safety awareness;Decreased coordination;Difficulty walking;Decreased knowledge of use of DME   Rehab Potential Good   PT Frequency 2x / week   PT Duration 4 weeks   PT Treatment/Interventions ADLs/Self Care Home Management;Electrical Stimulation;Therapeutic exercise;Therapeutic activities;Functional mobility training;Stair training;Gait training;DME Instruction;Balance training;Neuromuscular re-education;Manual techniques;Splinting;Passive range of motion;Visual/perceptual remediation/compensation   PT Next Visit Plan outcome measures    Consulted and Agree with Plan of Care Patient          G-Codes - 11/Dec 22, 201627    Functional Assessment Tool Used clinical judgement, history, outcome measures    Functional Limitation Mobility: Walking and  moving around   Mobility: Walking and Moving Around Current Status ([id number] least 20 percent but less than 40 percent impaired, limited or restricted   Mobility: Walking and Moving Around Goal Status ([phone] least 20 percent but less than 40 percent impaired, limited or restricted   Mobility: Walking and Moving Around Discharge Status ([id number] least 20 percent but less than 40 percent impaired, limited or restricted      Problem List Patient Active Problem List   Diagnosis Date Noted  . [place] ([place]/05/2014  . Lymphadenitis 04/09/2011  . HTN (hypertension) 04/09/2011  . DM (diabetes mellitus) ([place]  04/09/2011   Gorden Harms. Sahirah Rudell, PT, DPT [phone]  Jennetta Flood 04/27/2015, 4:28 PM  [place] MAIN [facility] SERVICES [address] [place], [place], [address] Phone: [phone]   Fax:  [phone]  Name: GOVANNI PLEMONS MRN: [id number] Date of Birth: 02-07-1956

## 2015-04-27 NOTE — Patient Instructions (Signed)
Continue with program to keep left arm away from body.

## 2015-05-02 ENCOUNTER — Encounter: Payer: Self-pay | Admitting: Occupational Therapy

## 2015-05-02 ENCOUNTER — Ambulatory Visit: Payer: No Typology Code available for payment source | Attending: Internal Medicine | Admitting: Occupational Therapy

## 2015-05-02 DIAGNOSIS — M6281 Muscle weakness (generalized): Secondary | ICD-10-CM | POA: Insufficient documentation

## 2015-05-02 NOTE — Therapy (Signed)
[place] MAIN [facility] SERVICES [address] [place], [place], [address] Phone: [phone]   Fax:  [phone]  Occupational Therapy Treatment  Patient Details  Name: Daniel [name] MRN: [id number] Date of Birth: [birth year] No Data Recorded  Encounter Date: 05/02/2015      OT End of Session - 05/02/15 1349    Visit Number 1   Number of Visits 7   Date for OT Re-Evaluation 05/22/15      Past Medical History  Diagnosis Date  . Hypertension   . Pancreatitis   . Diabetes mellitus without complication [facility])     Past Surgical History  Procedure Laterality Date  . Tee without cardioversion N/A 11/01/2014    Procedure: TRANSESOPHAGEAL ECHOCARDIOGRAM (TEE);  Surgeon: Wellington Hampshire, MD;  Location: [facility] ORS;  Service: Cardiovascular;  Laterality: N/A;    There were no vitals filed for this visit.  Visit Diagnosis:  Muscle weakness of left upper extremity      Subjective Assessment - 05/02/15 1347    Subjective  I want to see my family doctor   Pertinent History 59 year old male who suffered a CVA on October 28, 2014.     weight bearing through out session to reduce tone. Patient stretched into reflex inhibiting pattern through out session to aid in normalizing tone.  Very slow stretch in reflex inhibiting pattern to degrease pain and increase range while large hot pack over pecs and across elbow. Cues for positioning and technique.                          OT Education - 05/02/15 1349    Education provided Yes   Education Details education on why not to do too much elbow flexion.             OT Long Term Goals - 04/27/15 1343    OT LONG TERM GOAL #1   Status Achieved   OT LONG TERM GOAL #2   Status Achieved   OT LONG TERM GOAL #3   Status Partially Met   OT LONG TERM GOAL #4   Status On-going   OT LONG TERM GOAL #5   Status Achieved   OT LONG TERM GOAL #6   Status Achieved   OT LONG TERM GOAL #7   Status Achieved   OT LONG TERM GOAL #8   Status On-going               Plan - 05/02/15 1354    Clinical Impression Statement Patient is doing more at home.  Will work a few more weeks on th L UE to minimize tone and maximize function.   Pt will benefit from skilled therapeutic intervention in order to improve on the following deficits (Retired) Abnormal gait;Decreased activity tolerance;Decreased balance;Decreased coordination;Decreased endurance;Decreased knowledge of use of DME;Decreased mobility;Decreased range of motion;Decreased strength;Difficulty walking;Increased edema;Impaired flexibility;Impaired sensation;Impaired tone;Impaired UE functional use   OT Treatment/Interventions Self-care/ADL training;Electrical Stimulation;DME and/or AE instruction;Energy conservation;Neuromuscular education;Therapeutic exercise;Functional Mobility Training;Manual Therapy;Passive range of motion;Splinting;Therapeutic exercises;Therapeutic activities        Problem List Patient Active Problem List   Diagnosis Date Noted  . Stroke ([place]) 10/27/2014  . Lymphadenitis 04/09/2011  . HTN (hypertension) 04/09/2011  . DM (diabetes mellitus) ([place]) 04/09/2011   [name], MS/OTR/L  [name] 05/02/2015, 1:57 PM  [place] MAIN [facility] SERVICES [address] [place], [place], [address] Phone: [phone]  Fax:  [phone]  Name: Daniel [name] MRN: [id number] Date of Birth: 1956/02/10

## 2015-05-02 NOTE — Patient Instructions (Signed)
Instructed in new home program for active range.

## 2015-05-04 ENCOUNTER — Ambulatory Visit: Payer: No Typology Code available for payment source | Admitting: Occupational Therapy

## 2015-05-04 DIAGNOSIS — M6281 Muscle weakness (generalized): Secondary | ICD-10-CM

## 2015-05-04 NOTE — Therapy (Signed)
[place] MAIN [facility] SERVICES [address] [place], [place], [address] Phone: [phone]   Fax:  [phone]  Occupational Therapy Treatment  Patient Details  Name: Daniel [name] MRN: [id number] Date of Birth: 07/19/1955 No Data Recorded  Encounter Date: 05/04/2015      OT End of Session - 05/04/15 1536    Visit Number 2   Number of Visits 7   Date for OT Re-Evaluation 05/22/15   OT Start Time 1301   OT Stop Time 1345   OT Time Calculation (min) 44 min   Equipment Utilized During Treatment Large moist heat on left shoulder and bicep throug out the stretching.   Activity Tolerance Patient limited by pain   Behavior During Therapy [facility] for tasks assessed/performed      Past Medical History  Diagnosis Date  . Hypertension   . Pancreatitis   . Diabetes mellitus without complication [facility])     Past Surgical History  Procedure Laterality Date  . Tee without cardioversion N/A 11/01/2014    Procedure: TRANSESOPHAGEAL ECHOCARDIOGRAM (TEE);  Surgeon: Wellington Hampshire, MD;  Location: [facility] ORS;  Service: Cardiovascular;  Laterality: N/A;    There were no vitals filed for this visit.  Visit Diagnosis:  Muscle weakness of left upper extremity      Subjective Assessment - 05/04/15 1535    Subjective  I think I am getting better.   Pertinent History 59 year old male who suffered a CVA on October 28, 2014.    Mobilization of shoulder, wrist and fingers.  In supine, heat placed on left shoulder and bicep during stretching in reflex inhibiting pattern.  Stretch very slow and prolonged. Stretched (limited) L upper extremity in shoulder flexion, external and internal rotation elbow flexion and extension, forearm supination and pronation, wrist flexion and extension, and hand flexion and extension.  Now showing  Very minimal active motions in the following planes. Shoulder flexion, abduction elbow flexion and extension, forearm pronation, wrist  extension, all motions at the 2-/5 strength level.                                OT Long Term Goals - 04/27/15 1343    OT LONG TERM GOAL #1   Status Achieved   OT LONG TERM GOAL #2   Status Achieved   OT LONG TERM GOAL #3   Status Partially Met   OT LONG TERM GOAL #4   Status On-going   OT LONG TERM GOAL #5   Status Achieved   OT LONG TERM GOAL #6   Status Achieved   OT LONG TERM GOAL #7   Status Achieved   OT LONG TERM GOAL #8   Status On-going               Plan - 05/04/15 1537    Clinical Impression Statement With heat, able to stretch L UE more.   Pt will benefit from skilled therapeutic intervention in order to improve on the following deficits (Retired) Abnormal gait;Decreased activity tolerance;Decreased balance;Decreased coordination;Decreased endurance;Decreased knowledge of use of DME;Decreased mobility;Decreased range of motion;Decreased strength;Difficulty walking;Increased edema;Impaired flexibility;Impaired sensation;Impaired tone;Impaired UE functional use   OT Treatment/Interventions Self-care/ADL training;Electrical Stimulation;DME and/or AE instruction;Energy conservation;Neuromuscular education;Therapeutic exercise;Functional Mobility Training;Manual Therapy;Passive range of motion;Splinting;Therapeutic exercises;Therapeutic activities   Consulted and Agree with Plan of Care Patient        Problem List Patient Active Problem List   Diagnosis  Date Noted  . Stroke ([place]) 10/27/2014  . Lymphadenitis 04/09/2011  . HTN (hypertension) 04/09/2011  . DM (diabetes mellitus) ([place]) 04/09/2011   [name], MS/OTR/L  [name] 05/04/2015, 3:39 PM  [place] MAIN [facility] SERVICES [address] [place], [place], [address] Phone: [phone]   Fax:  [phone]  Name: Daniel [name] MRN: [id number] Date of Birth: 1955-09-01

## 2015-05-09 ENCOUNTER — Ambulatory Visit: Payer: No Typology Code available for payment source | Admitting: Occupational Therapy

## 2015-05-09 DIAGNOSIS — M6281 Muscle weakness (generalized): Secondary | ICD-10-CM

## 2015-05-09 NOTE — Therapy (Signed)
[place] MAIN [facility] SERVICES [address] [place], [place], [address] Phone: [phone]   Fax:  [phone]  Occupational Therapy Treatment  Patient Details  Name: Daniel [name] MRN: [id number] Date of Birth: 1955-10-19 No Data Recorded  Encounter Date: 05/09/2015      OT End of Session - 05/09/15 1546    Visit Number 3   Number of Visits 7   Date for OT Re-Evaluation 05/22/15      Past Medical History  Diagnosis Date  . Hypertension   . Pancreatitis   . Diabetes mellitus without complication [facility])     Past Surgical History  Procedure Laterality Date  . Tee without cardioversion N/A 11/01/2014    Procedure: TRANSESOPHAGEAL ECHOCARDIOGRAM (TEE);  Surgeon: Wellington Hampshire, MD;  Location: [facility] ORS;  Service: Cardiovascular;  Laterality: N/A;    There were no vitals filed for this visit.  Visit Diagnosis:  Muscle weakness of left upper extremity      Subjective Assessment - 05/09/15 1546    Subjective  You are doing good.   Pertinent History 59 year old male who suffered a CVA on October 28, 2014.    Mobilization of scapula, wrist and fingers. In supine heat applied to shoulder very slow stretch to shoulder in abduction and flexion elbow extension, forearm supination wrist and finger extension. (stretch is very slow secondary to pain) Active movement (very minimal in shoulder flexion, elbow extension and flexion forearm supination and pronation and wrist extension. All movements at the 2-/5 level.                               OT Long Term Goals - 04/27/15 1343    OT LONG TERM GOAL #1   Status Achieved   OT LONG TERM GOAL #2   Status Achieved   OT LONG TERM GOAL #3   Status Partially Met   OT LONG TERM GOAL #4   Status On-going   OT LONG TERM GOAL #5   Status Achieved   OT LONG TERM GOAL #6   Status Achieved   OT LONG TERM GOAL #7   Status Achieved   OT LONG TERM GOAL #8   Status On-going                Plan - 05/09/15 1547    Clinical Impression Statement Heat continues to help improve range.   Pt will benefit from skilled therapeutic intervention in order to improve on the following deficits (Retired) Abnormal gait;Decreased activity tolerance;Decreased balance;Decreased coordination;Decreased endurance;Decreased knowledge of use of DME;Decreased mobility;Decreased range of motion;Decreased strength;Difficulty walking;Increased edema;Impaired flexibility;Impaired sensation;Impaired tone;Impaired UE functional use   OT Treatment/Interventions Self-care/ADL training;Electrical Stimulation;DME and/or AE instruction;Energy conservation;Neuromuscular education;Therapeutic exercise;Functional Mobility Training;Manual Therapy;Passive range of motion;Splinting;Therapeutic exercises;Therapeutic activities        Problem List Patient Active Problem List   Diagnosis Date Noted  . Stroke ([place]) 10/27/2014  . Lymphadenitis 04/09/2011  . HTN (hypertension) 04/09/2011  . DM (diabetes mellitus) ([place]) 04/09/2011   [name], MS/OTR/L  [name] 05/09/2015, 3:49 PM  [place] MAIN [facility] SERVICES [address] [place], [place], [address] Phone: [phone]   Fax:  [phone]  Name: Daniel [name] MRN: [id number] Date of Birth: 1956-05-08

## 2015-05-11 ENCOUNTER — Ambulatory Visit: Payer: No Typology Code available for payment source | Admitting: Occupational Therapy

## 2015-05-11 DIAGNOSIS — M6281 Muscle weakness (generalized): Secondary | ICD-10-CM

## 2015-05-11 NOTE — Therapy (Signed)
[place] MAIN [facility] SERVICES [address] [place], [place], [address] Phone: [phone]   Fax:  [phone]  Occupational Therapy Treatment  Patient Details  Name: Daniel [name] MRN: [id number] Date of Birth: 25-May-1956 No Data Recorded  Encounter Date: 05/11/2015      OT End of Session - 05/11/15 1357    Visit Number 4   Number of Visits 7   Date for OT Re-Evaluation 05/22/15   OT Start Time 1330   OT Stop Time 1414   OT Time Calculation (min) 44 min   Equipment Utilized During Treatment Large moist heat on left shoulder and bicep throug out the stretching.      Past Medical History  Diagnosis Date  . Hypertension   . Pancreatitis   . Diabetes mellitus without complication [facility])     Past Surgical History  Procedure Laterality Date  . Tee without cardioversion N/A 11/01/2014    Procedure: TRANSESOPHAGEAL ECHOCARDIOGRAM (TEE);  Surgeon: Wellington Hampshire, MD;  Location: [facility] ORS;  Service: Cardiovascular;  Laterality: N/A;    There were no vitals filed for this visit.  Visit Diagnosis:  Muscle weakness of left upper extremity      Subjective Assessment - 05/11/15 1355    Subjective  Reports that he does not were the splint.   Pertinent History 59 year old male who suffered a CVA on October 28, 2014.    Mobilization of scapula, shoulder, wrist and fingers. Patient stretched into reflex inhibiting pattern through out session to aid in normalizing tone. Weight bearing through out session. Limited (by pain) stretching in L upper extremity  shoulder flexion, external and internal rotation elbow flexion and extension, forearm supination and pronation, wrist flexion and extension, and hand flexion and extension very slow. Then active (very limited to 2-/5) in same set of movements actively (shoulder flexion and abduction eccentric. Cues needed for positioning and safety.                          OT Education -  05/11/15 1356    Education provided Yes   Education Details purpose for wearing splint   Person(s) Educated Patient   Methods Explanation             OT Long Term Goals - 04/27/15 1343    OT LONG TERM GOAL #1   Status Achieved   OT LONG TERM GOAL #2   Status Achieved   OT LONG TERM GOAL #3   Status Partially Met   OT LONG TERM GOAL #4   Status On-going   OT LONG TERM GOAL #5   Status Achieved   OT LONG TERM GOAL #6   Status Achieved   OT LONG TERM GOAL #7   Status Achieved   OT LONG TERM GOAL #8   Status On-going               Plan - 05/11/15 1358    Clinical Impression Statement Feel that not wering splint is affecting hand contractures.   Pt will benefit from skilled therapeutic intervention in order to improve on the following deficits (Retired) Abnormal gait;Decreased activity tolerance;Decreased balance;Decreased coordination;Decreased endurance;Decreased knowledge of use of DME;Decreased mobility;Decreased range of motion;Decreased strength;Difficulty walking;Increased edema;Impaired flexibility;Impaired sensation;Impaired tone;Impaired UE functional use   OT Treatment/Interventions Self-care/ADL training;Electrical Stimulation;DME and/or AE instruction;Energy conservation;Neuromuscular education;Therapeutic exercise;Functional Mobility Training;Manual Therapy;Passive range of motion;Splinting;Therapeutic exercises;Therapeutic activities        Problem List Patient  Active Problem List   Diagnosis Date Noted  . Stroke ([place]) 10/27/2014  . Lymphadenitis 04/09/2011  . HTN (hypertension) 04/09/2011  . DM (diabetes mellitus) ([place]) 04/09/2011    [name] 05/11/2015, 2:35 PM  [place] MAIN [facility] SERVICES [address] [place], [place], [address] Phone: [phone]   Fax:  [phone]  Name: Daniel [name] MRN: [id number] Date of Birth: Sep 03, 1955

## 2015-05-11 NOTE — Patient Instructions (Signed)
Instructed to were his splint.

## 2015-05-16 ENCOUNTER — Ambulatory Visit: Payer: No Typology Code available for payment source | Admitting: Occupational Therapy

## 2015-05-18 ENCOUNTER — Encounter: Payer: Self-pay | Admitting: Occupational Therapy

## 2015-05-18 ENCOUNTER — Ambulatory Visit: Payer: No Typology Code available for payment source | Admitting: Occupational Therapy

## 2015-05-18 DIAGNOSIS — M6281 Muscle weakness (generalized): Secondary | ICD-10-CM

## 2015-05-18 NOTE — Therapy (Signed)
[place] MAIN [facility] SERVICES [address] [place], [place], [address] Phone: [phone]   Fax:  [phone]  Occupational Therapy Treatment/Discharge Summary  Patient Details  Name: Daniel [name] MRN: [id number] Date of Birth: December 16, 1955 No Data Recorded  Encounter Date: 05/18/2015      OT End of Session - 05/18/15 1322    Visit Number 4   Number of Visits 7   Date for OT Re-Evaluation 05/22/15   OT Start Time 1301   OT Stop Time 1345   OT Time Calculation (min) 44 min      Past Medical History  Diagnosis Date  . Hypertension   . Pancreatitis   . Diabetes mellitus without complication [facility])     Past Surgical History  Procedure Laterality Date  . Tee without cardioversion N/A 11/01/2014    Procedure: TRANSESOPHAGEAL ECHOCARDIOGRAM (TEE);  Surgeon: Wellington Hampshire, MD;  Location: [facility] ORS;  Service: Cardiovascular;  Laterality: N/A;    There were no vitals filed for this visit.  Visit Diagnosis:  Muscle weakness of left upper extremity      Subjective Assessment - 05/18/15 1315    Subjective  Patient agrees that this is a good time to stop OT   Pertinent History 59 year old male who suffered a CVA on October 28, 2014.   Pain Score --  reports no pain when arm at rest and now no more than a 2 with movement. as compaired to 3 weeks ago when he reported a 5/10   Pain Location Arm   Pain Orientation Left   Pain Descriptors / Indicators Throbbing   Pain Type Chronic pain   Pain Radiating Towards Pain at pectoralis and bicep tendon.   Multiple Pain Sites No    Patient has achieved 5 of 8 goals and progress on 2 more. Goals listed below he can now open bottles, mow lawn (if mower started for him), he can cut softer foods, he dresses himself, he can perform a one handed shoe tie, his hand edema is resolved (measurements equil). His fingers are still contracted, but patient is not compliant with his hand splint program. MCP  measurements for extension (passive) finger 2 -20o, finger 3 -30o, finger 4 -35o, finger 5 -60o.                           OT Education - 05/18/15 1321    Education provided Yes   Education Details Educated why it is important to were splint as fingers are tight.   Person(s) Educated Patient   Methods Explanation   Comprehension Verbalized understanding             OT Long Term Goals - 05/18/15 1324    OT LONG TERM GOAL #1   Title Patient will increase L UE function or compensate to be able to open jars and bottles.   Baseline can now open bottles but not jars   Time 12   Period Weeks   Status Achieved   OT LONG TERM GOAL #2   Title Will be able to cut his his lawn.   Baseline Patient now able to mow lawn if wife starts mower.   Time 12   Period Weeks   Status Achieved   OT LONG TERM GOAL #3   Title will be able to cut his food with or with out assistive devices.   Baseline Patient now can cut food 50% of  the time (softer foods)   Time 12   Period Weeks   Status Partially Met   OT LONG TERM GOAL #4   Title Will improve enought to possibly return to work.   Baseline Unable to return to work   Time 12   Period Weeks   Status On-going   OT LONG TERM GOAL #5   Title Will be able to dress himself with set up   Baseline Patient dresses himself    Time 12   Period Weeks   Status Achieved   OT LONG TERM GOAL #6   Title Will be able to tie shoes eather with one handed technique or 2 handed.   Baseline Pateint able to complete a one handed shoe tie.   Time 12   Period Weeks   Status Achieved   OT LONG TERM GOAL #7   Title Will control L hand edema to be equil to the right in size.   Baseline hands measure equil   Time 12   Period Weeks   Status Achieved   OT LONG TERM GOAL #8   Title Will increase L UE finger range of motion    Baseline fingers are contracte in flexion.(40%)   Time 12   Period Weeks   Status On-going                Plan - 05/18/15 1353    Clinical Impression Statement Patient has achieved 5 of 8 and made progress with 2 more. Progress with the left upper extremity has peaked. He reports that pain is from 0 to 2 dependeng on movement, this therapist thinks it is more than a 2 with stretching. Patient not compliant with hand splint use. No further OT indecated at this time.   Pt will benefit from skilled therapeutic intervention in order to improve on the following deficits (Retired) Abnormal gait;Decreased activity tolerance;Decreased balance;Decreased coordination;Decreased endurance;Decreased knowledge of use of DME;Decreased mobility;Decreased range of motion;Decreased strength;Difficulty walking;Increased edema;Impaired flexibility;Impaired sensation;Impaired tone;Impaired UE functional use. Patient has peeked in his progress and no further Occupational Therapy needed at this time.   OT Treatment/Interventions Self-care/ADL training;Electrical Stimulation;DME and/or AE instruction;Energy conservation;Neuromuscular education;Therapeutic exercise;Functional Mobility Training;Manual Therapy;Passive range of motion;Splinting;Therapeutic exercises;Therapeutic activities        Problem List Patient Active Problem List   Diagnosis Date Noted  . Stroke ([place]) 10/27/2014  . Lymphadenitis 04/09/2011  . HTN (hypertension) 04/09/2011  . DM (diabetes mellitus) ([place]) 04/09/2011   [name], MS/OTR/L  [name] 05/18/2015, 2:07 PM  [place] MAIN [facility] SERVICES [address] [place], [place], [address] Phone: [phone]   Fax:  [phone]  Name: Daniel [name] MRN: [id number] Date of Birth: 1955-06-17

## 2015-05-18 NOTE — Patient Instructions (Signed)
Reinforced that he should were his splint (he does not) as fingers continue to be tight.

## 2015-05-24 ENCOUNTER — Ambulatory Visit: Payer: No Typology Code available for payment source | Admitting: Occupational Therapy

## 2015-05-26 ENCOUNTER — Encounter: Payer: No Typology Code available for payment source | Admitting: Occupational Therapy

## 2015-07-15 NOTE — Therapy (Signed)
[place] MAIN [facility] SERVICES [address] [place], [place], [address] Phone: [phone]   Fax:  [phone]  Occupational Therapy Treatment  Patient Details  Name: Daniel [name] MRN: [id number] Date of Birth: 07-Apr-1956 No Data Recorded  Encounter Date: 04/11/2015      OT End of Session - 07/15/15 1446    Visit Number 19   Number of Visits 24   Date for OT Re-Evaluation 05/02/15   Equipment Utilized During Treatment moist heat to left shoulder during stretching.   Activity Tolerance Patient limited by pain   Behavior During Therapy [facility] for tasks assessed/performed      Past Medical History  Diagnosis Date  . Hypertension   . Pancreatitis   . Diabetes mellitus without complication [facility])     Past Surgical History  Procedure Laterality Date  . Tee without cardioversion N/A 11/01/2014    Procedure: TRANSESOPHAGEAL ECHOCARDIOGRAM (TEE);  Surgeon: Wellington Hampshire, MD;  Location: [facility] ORS;  Service: Cardiovascular;  Laterality: N/A;    There were no vitals filed for this visit.  Visit Diagnosis:  Muscle weakness of left upper extremity  Weakness      Subjective Assessment - 07/15/15 1446    Subjective  Patient reports that heat makes his shoulder feel better.   Pertinent History 60 year old male who suffered a CVA on October 28, 2014.                       Weakness  Very limited stretching in shoulder flexion, external and internal rotation elbow flexion and extension, forearm supination and pronation, wrist flexion and extension, and hand flexion and extension. Mobilization to wrist and fingers. Weight bearing completed. Used heat during stretching.                  OT Long Term Goals - 05/18/15 1324    OT LONG TERM GOAL #1   Title Patient will increase L UE function or compensate to be able to open jars and bottles.   Baseline can now open bottles but not jars   Time 12   Period Weeks   Status  Achieved   OT LONG TERM GOAL #2   Title Will be able to cut his his lawn.   Baseline Patient now able to mow lawn if wife starts mower.   Time 12   Period Weeks   Status Achieved   OT LONG TERM GOAL #3   Title will be able to cut his food with or with out assistive devices.   Baseline Patient now can cut food 50% of the time (softer foods)   Time 12   Period Weeks   Status Partially Met   OT LONG TERM GOAL #4   Title Will improve enought to possibly return to work.   Baseline Unable to return to work   Time 12   Period Weeks   Status On-going   OT LONG TERM GOAL #5   Title Will be able to dress himself with set up   Baseline Patient dresses himself    Time 12   Period Weeks   Status Achieved   OT LONG TERM GOAL #6   Title Will be able to tie shoes eather with one handed technique or 2 handed.   Baseline Pateint able to complete a one handed shoe tie.   Time 12   Period Weeks   Status Achieved   OT LONG TERM GOAL #7  Title Will control L hand edema to be equil to the right in size.   Baseline hands measure equil   Time 12   Period Weeks   Status Achieved   OT LONG TERM GOAL #8   Title Will increase L UE finger range of motion    Baseline fingers are contracte in flexion.(40%)   Time 12   Period Weeks   Status On-going               Plan - 07/15/15 1446    Clinical Impression Statement Patient achieving functional goals, upper extremity continues to be painful when bringing arm out of reflex inhibiting pattern. Encourage patient to bring arm away from body as much as possible.    Pt will benefit from skilled therapeutic intervention in order to improve on the following deficits (Retired) Abnormal gait;Decreased activity tolerance;Decreased balance;Decreased coordination;Decreased endurance;Decreased knowledge of use of DME;Decreased mobility;Decreased range of motion;Decreased strength;Difficulty walking;Increased edema;Impaired flexibility;Impaired  sensation;Impaired tone;Impaired UE functional use   OT Frequency 2x / week   OT Duration 12 weeks   OT Treatment/Interventions Self-care/ADL training;Electrical Stimulation;DME and/or AE instruction;Energy conservation;Neuromuscular education;Therapeutic exercise;Functional Mobility Training;Manual Therapy;Passive range of motion;Splinting;Therapeutic exercises;Therapeutic activities   Consulted and Agree with Plan of Care Patient        Problem List Patient Active Problem List   Diagnosis Date Noted  . Stroke ([place]) 10/27/2014  . Lymphadenitis 04/09/2011  . HTN (hypertension) 04/09/2011  . DM (diabetes mellitus) ([place]) 04/09/2011   [name], MS/OTR/L  [name] 07/15/2015, 2:50 PM  [place] MAIN [facility] SERVICES [name] [place], [place], [address] Phone: [phone]   Fax:  [phone]  Name: Daniel [name] MRN: [id number] Date of Birth: [birth year]

## 2015-08-06 ENCOUNTER — Emergency Department: Payer: BLUE CROSS/BLUE SHIELD

## 2015-08-06 ENCOUNTER — Emergency Department
Admission: EM | Admit: 2015-08-06 | Discharge: 2015-08-07 | Disposition: A | Discharge status: Home / Self Care | Payer: BLUE CROSS/BLUE SHIELD | Attending: Emergency Medicine | Admitting: Emergency Medicine

## 2015-08-06 DIAGNOSIS — I10 Essential (primary) hypertension: Secondary | ICD-10-CM | POA: Diagnosis not present

## 2015-08-06 DIAGNOSIS — E119 Type 2 diabetes mellitus without complications: Secondary | ICD-10-CM | POA: Insufficient documentation

## 2015-08-06 DIAGNOSIS — Z87891 Personal history of nicotine dependence: Secondary | ICD-10-CM | POA: Insufficient documentation

## 2015-08-06 DIAGNOSIS — R410 Disorientation, unspecified: Secondary | ICD-10-CM | POA: Insufficient documentation

## 2015-08-06 DIAGNOSIS — R569 Unspecified convulsions: Secondary | ICD-10-CM | POA: Insufficient documentation

## 2015-08-06 LAB — CBC WITH DIFFERENTIAL/PLATELET
BASOS ABS: 0 10*3/uL (ref 0–0.1)
Basophils Relative: 0 %
EOS PCT: 3 %
Eosinophils Absolute: 0.1 10*3/uL (ref 0–0.7)
HEMATOCRIT: 42.3 % (ref 40.0–52.0)
Hemoglobin: 14.1 g/dL (ref 13.0–18.0)
LYMPHS ABS: 0.8 10*3/uL — AB (ref 1.0–3.6)
LYMPHS PCT: 18 %
MCH: 26.8 pg (ref 26.0–34.0)
MCHC: 33.3 g/dL (ref 32.0–36.0)
MCV: 80.3 fL (ref 80.0–100.0)
MONO ABS: 0.3 10*3/uL (ref 0.2–1.0)
MONOS PCT: 7 %
Neutro Abs: 3.4 10*3/uL (ref 1.4–6.5)
Neutrophils Relative %: 72 %
PLATELETS: 138 10*3/uL — AB (ref 150–440)
RBC: 5.27 MIL/uL (ref 4.40–5.90)
RDW: 13.8 % (ref 11.5–14.5)
WBC: 4.7 10*3/uL (ref 3.8–10.6)

## 2015-08-06 LAB — COMPREHENSIVE METABOLIC PANEL
ALT: 13 U/L — ABNORMAL LOW (ref 17–63)
ANION GAP: 7 (ref 5–15)
AST: 19 U/L (ref 15–41)
Albumin: 4 g/dL (ref 3.5–5.0)
Alkaline Phosphatase: 61 U/L (ref 38–126)
BILIRUBIN TOTAL: 0.8 mg/dL (ref 0.3–1.2)
BUN: 13 mg/dL (ref 6–20)
CHLORIDE: 107 mmol/L (ref 101–111)
CO2: 27 mmol/L (ref 22–32)
Calcium: 9.2 mg/dL (ref 8.9–10.3)
Creatinine, Ser: 1.2 mg/dL (ref 0.61–1.24)
Glucose, Bld: 103 mg/dL — ABNORMAL HIGH (ref 65–99)
POTASSIUM: 3.4 mmol/L — AB (ref 3.5–5.1)
Sodium: 141 mmol/L (ref 135–145)
TOTAL PROTEIN: 7.1 g/dL (ref 6.5–8.1)

## 2015-08-06 LAB — TROPONIN I

## 2015-08-06 MED ORDER — LEVETIRACETAM 500 MG PO TABS: 500.0000 mg | ORAL_TABLET | Freq: Two times a day (BID) | ORAL | Status: DC

## 2015-08-06 MED ORDER — SODIUM CHLORIDE 0.9 % IV SOLN
1000.0000 mg | Freq: Once | INTRAVENOUS | Status: AC
  Administered 2015-08-06: 1000 mg via INTRAVENOUS
  Filled 2015-08-06: qty 10

## 2015-08-06 NOTE — Discharge Instructions (Signed)

## 2015-08-06 NOTE — ED Provider Notes (Addendum)
[facility] Emergency Department Provider Note     Time seen: ----------------------------------------- 8:52 PM on 08/06/2015 -----------------------------------------  L5 caveat: Review of systems and history is limited by altered metal status and postictal state  I have reviewed the triage vital signs and the nursing notes.   HISTORY  Chief Complaint Seizures    HPI Daniel [name] is a 60 y.o. male who presents to ER after seizure. EMS reports from the wife that he was shaking uncontrollably for several minutes of foaming at the mouth. His also incontinent, he does not have a history of seizures. Patient arrives confused but has no complaints.   Past Medical History  Diagnosis Date  . Hypertension   . Pancreatitis   . Diabetes mellitus without complication [facility])     Patient Active Problem List   Diagnosis Date Noted  . Stroke ([place]) 10/27/2014  . Lymphadenitis 04/09/2011  . HTN (hypertension) 04/09/2011  . DM (diabetes mellitus) ([place]) 04/09/2011    Past Surgical History  Procedure Laterality Date  . Tee without cardioversion N/A 11/01/2014    Procedure: TRANSESOPHAGEAL ECHOCARDIOGRAM (TEE);  Surgeon: Wellington Hampshire, MD;  Location: [facility] ORS;  Service: Cardiovascular;  Laterality: N/A;    Allergies Review of patient's allergies indicates no known allergies.  Social History Social History  Substance Use Topics  . Smoking status: Former Smoker    Quit date: 10/28/2014  . Smokeless tobacco: None  . Alcohol Use: Yes    Review of Systems Constitutional: Negative for fever. Eyes: Negative for visual changes. ENT: Negative for sore throat. Cardiovascular: Negative for chest pain. Respiratory: Negative for shortness of breath. Gastrointestinal: Negative for abdominal pain, vomiting and diarrhea. Genitourinary: Negative for dysuria. Musculoskeletal: Negative for back pain. Skin: Negative for rash. Neurological: Negative for headaches,  focal weakness or numbness.Positive for seizure  10-point ROS otherwise negative.  ____________________________________________   PHYSICAL EXAM:  VITAL SIGNS: ED Triage Vitals  Enc Vitals Group     BP --      Pulse --      Resp --      Temp --      Temp src --      SpO2 --      Weight --      Height --      Head Cir --      Peak Flow --      Pain Score --      Pain Loc --      Pain Edu? --      Excl. in [place]? --     Constitutional: Alert.  Well appearing and in no distress. Eyes: Conjunctivae are normal. PERRL. Normal extraocular movements. ENT   Head: Normocephalic and atraumatic.   Nose: No congestion/rhinnorhea.   Mouth/Throat: Mucous membranes are moist. No intraoral lacerations or tongue lacerations   Neck: No stridor. Cardiovascular: Normal rate, regular rhythm. Normal and symmetric distal pulses are present in all extremities. No murmurs, rubs, or gallops. Respiratory: Normal respiratory effort without tachypnea nor retractions. Breath sounds are clear and equal bilaterally. No wheezes/rales/rhonchi. Gastrointestinal: Soft and nontender. No distention. No abdominal bruits.  Musculoskeletal: Nontender with normal range of motion in all extremities. No joint effusions.  No lower extremity tenderness nor edema. Neurologic:  Normal speech and language. No gross focal neurologic deficits are appreciated.  Skin:  Skin is warm, dry and intact. No rash noted. Psychiatric: Mood and affect are normal. Speech and behavior are normal. Patient exhibits appropriate insight and judgment. ____________________________________________  EKG: Interpreted by me. Normal sinus rhythm with a rate of 67 bpm, normal PR interval, normal QRS, normal QT interval. Normal axis.  ____________________________________________  ED COURSE:  Pertinent labs & imaging results that were available during my care of the patient were reviewed by me and considered in my medical decision making  (see chart for details). Patient with new onset seizure, we'll obtain basic labs and CT imaging. ____________________________________________    LABS (pertinent positives/negatives)  Labs Reviewed  CBC WITH DIFFERENTIAL/PLATELET - Abnormal; Notable for the following:    Platelets 138 (*)    Lymphs Abs 0.8 (*)    All other components within normal limits  COMPREHENSIVE METABOLIC PANEL - Abnormal; Notable for the following:    Potassium 3.4 (*)    Glucose, Bld 103 (*)    ALT 13 (*)    All other components within normal limits  TROPONIN I  URINALYSIS COMPLETEWITH MICROSCOPIC ([facility] ONLY)    RADIOLOGY Images were viewed by me  CT head IMPRESSION: Sequela of right MCA distribution infarct with encephalomalacia, may be source of seizure. No acute intracranial abnormality. ____________________________________________  FINAL ASSESSMENT AND PLAN  Seizure  Plan: Patient with labs and imaging as dictated above. Patient was seizure likely secondary to the previous MCA stroke that he has had. He'll be started on Keppra and encouraged to have close follow-up with his neurologist and primary care doctor for reevaluation. Earleen Newport, MD   Earleen Newport, MD 08/06/15 [place], MD 08/06/15 [phone]

## 2015-08-06 NOTE — ED Notes (Signed)
Pt arrived via EMS c/o seizure. Per EMS wife reported pt shaking uncontrollably for several minutes, foaming at the mouth, as well as being incontinent. Pt does not have a hx of seizures.

## 2015-08-06 NOTE — ED Notes (Signed)
Patient had possible seizure per wife. Patient was lying in bed, suddenly seized, foamed at mouth and lost control of bladder. Patient has no history of seizures but does has history of stroke.

## 2015-08-07 NOTE — ED Notes (Signed)
Pt denies needs, keppra running without difficulty. Call bell at side.

## 2015-08-07 NOTE — ED Notes (Signed)
Report received from terry, rn. Pt resting denies complaints of pain. Seizure pads in place to protect pt. Cardiac monitor in place.

## 2016-12-24 ENCOUNTER — Emergency Department
Admission: EM | Admit: 2016-12-24 | Discharge: 2016-12-24 | Disposition: A | Discharge status: Home / Self Care | Payer: PRIVATE HEALTH INSURANCE | Attending: Emergency Medicine | Admitting: Emergency Medicine

## 2016-12-24 ENCOUNTER — Encounter: Payer: Self-pay | Admitting: *Deleted

## 2016-12-24 ENCOUNTER — Emergency Department: Payer: PRIVATE HEALTH INSURANCE

## 2016-12-24 DIAGNOSIS — Z7982 Long term (current) use of aspirin: Secondary | ICD-10-CM | POA: Insufficient documentation

## 2016-12-24 DIAGNOSIS — Z8673 Personal history of transient ischemic attack (TIA), and cerebral infarction without residual deficits: Secondary | ICD-10-CM | POA: Insufficient documentation

## 2016-12-24 DIAGNOSIS — I10 Essential (primary) hypertension: Secondary | ICD-10-CM | POA: Insufficient documentation

## 2016-12-24 DIAGNOSIS — Z79899 Other long term (current) drug therapy: Secondary | ICD-10-CM | POA: Insufficient documentation

## 2016-12-24 DIAGNOSIS — R569 Unspecified convulsions: Secondary | ICD-10-CM

## 2016-12-24 DIAGNOSIS — Z87891 Personal history of nicotine dependence: Secondary | ICD-10-CM | POA: Insufficient documentation

## 2016-12-24 DIAGNOSIS — E119 Type 2 diabetes mellitus without complications: Secondary | ICD-10-CM | POA: Insufficient documentation

## 2016-12-24 HISTORY — DX: Cerebral infarction, unspecified: I63.9

## 2016-12-24 LAB — BASIC METABOLIC PANEL
ANION GAP: 20 — AB (ref 5–15)
ANION GAP: 9 (ref 5–15)
BUN: 17 mg/dL (ref 6–20)
BUN: 16 mg/dL (ref 6–20)
CALCIUM: 9.5 mg/dL (ref 8.9–10.3)
CO2: 17 mmol/L — AB (ref 22–32)
CO2: 25 mmol/L (ref 22–32)
Calcium: 8.9 mg/dL (ref 8.9–10.3)
Chloride: 104 mmol/L (ref 101–111)
Chloride: 108 mmol/L (ref 101–111)
Creatinine, Ser: 1.96 mg/dL — ABNORMAL HIGH (ref 0.61–1.24)
Creatinine, Ser: 1.66 mg/dL — ABNORMAL HIGH (ref 0.61–1.24)
GFR calc Af Amer: 41 mL/min — ABNORMAL LOW (ref >60)
GFR calc Af Amer: 50 mL/min — ABNORMAL LOW (ref >60)
GFR calc non Af Amer: 35 mL/min — ABNORMAL LOW (ref >60)
GFR calc non Af Amer: 43 mL/min — ABNORMAL LOW (ref >60)
GLUCOSE: 157 mg/dL — AB (ref 65–99)
GLUCOSE: 128 mg/dL — AB (ref 65–99)
Potassium: 3 mmol/L — ABNORMAL LOW (ref 3.5–5.1)
Potassium: 2.8 mmol/L — ABNORMAL LOW (ref 3.5–5.1)
Sodium: 141 mmol/L (ref 135–145)
Sodium: 142 mmol/L (ref 135–145)

## 2016-12-24 LAB — CBC
HCT: 41.8 % (ref 40.0–52.0)
Hemoglobin: 13.9 g/dL (ref 13.0–18.0)
MCH: 27 pg (ref 26.0–34.0)
MCHC: 33.3 g/dL (ref 32.0–36.0)
MCV: 81 fL (ref 80.0–100.0)
Platelets: 175 10*3/uL (ref 150–440)
RBC: 5.16 MIL/uL (ref 4.40–5.90)
RDW: 14.8 % — AB (ref 11.5–14.5)
WBC: 6.6 10*3/uL (ref 3.8–10.6)

## 2016-12-24 MED ORDER — SODIUM CHLORIDE 0.9 % IV BOLUS (SEPSIS)
1000.0000 mL | Freq: Once | INTRAVENOUS | Status: AC
  Administered 2016-12-24: 1000 mL via INTRAVENOUS

## 2016-12-24 MED ORDER — GABAPENTIN 300 MG PO CAPS: 300.0000 mg | ORAL_CAPSULE | Freq: Three times a day (TID) | ORAL | 0 refills | Status: DC

## 2016-12-24 MED ORDER — LEVETIRACETAM 500 MG PO TABS
1000.0000 mg | ORAL_TABLET | Freq: Once | ORAL | Status: AC
  Administered 2016-12-24: 1000 mg via ORAL
  Filled 2016-12-24: qty 2

## 2016-12-24 MED ORDER — LEVETIRACETAM 500 MG PO TABS: 500.0000 mg | ORAL_TABLET | Freq: Two times a day (BID) | ORAL | 0 refills | Status: DC

## 2016-12-24 NOTE — ED Notes (Signed)
Patient transported to CT 

## 2016-12-24 NOTE — ED Triage Notes (Signed)
Pt arrives via EMS, pt was walking down the street and bystanders witnessed a seizure, hx of seizures, pt is on keppra but states he has been out of keppra for 1 week, arrives postictal, EDP at bedside

## 2016-12-24 NOTE — ED Notes (Signed)
Pt resting in bed, family at bedside.

## 2016-12-24 NOTE — ED Provider Notes (Signed)
[facility] Emergency Department Provider Note  Time seen: 4:55 PM  I have reviewed the triage vital signs and the nursing notes.   HISTORY  Chief Complaint Seizures    HPI Daniel [name] is a 61 y.o. male with a past medical history of diabetes, hypertension, CVA, seizure disorder, presents to the emergency department after a seizure. According to report patient was witnessed by a bystander falling to the ground with generalized tonic-clonic activity. Patient has a history of a seizure disorder takes Keppra 500 mg twice a day. Per family the patient has been out of his Lena for the past 7 days as he did not have any more refills and they would not give him more medication until he saw his doctor which he does not have an appointment until this coming Wednesday. Patient denies any alcohol use. Patient states he feels tired currently, with a mild headache but no other complaints.  Past Medical History:  Diagnosis Date  . Diabetes mellitus without complication ([place])   . Hypertension   . Pancreatitis   . Stroke [facility])     Patient Active Problem List   Diagnosis Date Noted  . Stroke ([place]) 10/27/2014  . Lymphadenitis 04/09/2011  . HTN (hypertension) 04/09/2011  . DM (diabetes mellitus) ([name]) 04/09/2011    Past Surgical History:  Procedure Laterality Date  . TEE WITHOUT CARDIOVERSION N/A 11/01/2014   Procedure: TRANSESOPHAGEAL ECHOCARDIOGRAM (TEE);  Surgeon: Wellington Hampshire, MD;  Location: [facility] ORS;  Service: Cardiovascular;  Laterality: N/A;    Prior to Admission medications   Medication Sig Start Date End Date Taking? Authorizing Provider  amLODipine (NORVASC) 10 MG tablet Take 10 mg by mouth daily.    [provider]  aspirin 325 MG tablet Take 1 tablet (325 mg total) by mouth daily. 11/02/14   Henreitta Leber, MD  gemfibrozil (LOPID) 600 MG tablet Take 600 mg by mouth 2 (two) times daily before a meal.    [provider]   levETIRAcetam (KEPPRA) 500 MG tablet Take 1 tablet (500 mg total) by mouth 2 (two) times daily. 08/06/15   Earleen Newport, MD  meloxicam (MOBIC) 15 MG tablet Take 15 mg by mouth daily.    [provider]  nystatin (MYCOSTATIN) 100000 UNIT/ML suspension Take 5 mLs (500,000 Units total) by mouth 4 (four) times daily. 11/02/14   Henreitta Leber, MD  rosuvastatin (CRESTOR) 10 MG tablet Take 1 tablet (10 mg total) by mouth daily at 6 PM. 11/02/14   Sainani, Belia Heman, MD  senna-docusate (SENOKOT-S) 8.6-50 MG per tablet Take 1 tablet by mouth at bedtime as needed for mild constipation. 11/02/14   Henreitta Leber, MD    No Known Allergies  Family History  Problem Relation Age of Onset  . CAD Sister   . Hypertension Sister     Social History Social History  Substance Use Topics  . Smoking status: Former Smoker    Quit date: 10/28/2014  . Smokeless tobacco: Not on file  . Alcohol use Yes    Review of Systems Constitutional: Negative for fever.Positive for generalized fatigue. Cardiovascular: Negative for chest pain. Respiratory: Negative for shortness of breath. Gastrointestinal: Negative for abdominal pain Genitourinary: Negative for dysuria. Musculoskeletal: Negative for back pain. Negative for neck pain. Skin: Abrasion to the back of the scalp. Neurological: Mild headache without focal weakness or numbness. All other ROS negative  ____________________________________________   PHYSICAL EXAM:  VITAL SIGNS: ED Triage Vitals  Enc Vitals Group  BP 12/24/16 1648 (!) 129/91     Pulse Rate 12/24/16 1648 (!) 103     Resp 12/24/16 1648 16     Temp 12/24/16 1648 97.8 F (36.6 C)     Temp Source 12/24/16 1648 Oral     SpO2 12/24/16 1648 91 %     Weight 12/24/16 1650 220 lb (99.8 kg)     Height 12/24/16 1650 5\' 8"  (1.727 m)     Head Circumference --      Peak Flow --      Pain Score --      Pain Loc --      Pain Edu? --      Excl. in [place]? --     Constitutional:  Alert, somewhat somnolent but does awaken to voice and answer questions appropriately. Follows commands. Eyes: Normal exam ENT   Head: Small 2 x 2 centimeters abrasion to occipital scalp, no laceration. Hemostatic.   Mouth/Throat: Mucous membranes are moist. Cardiovascular: Normal rate, regular rhythm. No murmur Respiratory: Normal respiratory effort without tachypnea nor retractions. Breath sounds are clear  Gastrointestinal: Soft and nontender. No distention. Musculoskeletal: Nontender with normal range of motion in all extremities. Neurologic:  Mildly slurred speech however the patient is likely postictal at this point. Normal language. Patient is able to move all extremities. Has a prior CVA, uses a cane to ambulate. Skin:  Skin is warm, dry and intact.  Psychiatric: Mood and affect are normal.   ____________________________________________   RADIOLOGY  CT head negative for acute abnormality remote infarct.  ____________________________________________   INITIAL IMPRESSION / ASSESSMENT AND PLAN / ED COURSE  Pertinent labs & imaging results that were available during my care of the patient were reviewed by me and considered in my medical decision making (see chart for details).  Patient presents to the emergency department after a seizure. Patient is on Keppra 500 mg twice daily but has been off this medication for the past 1 week due to a miscommunication with his primary care doctor. Has an appointment this coming Wednesday. Patient normally takes gabapentin 300 mg 3 times a day as well as Keppra 500 mg twice a day. Overall the patient appears well but does appear to be postictal. Small abrasion to the back of the scalp. We'll obtain a CT scan, labs, loaded with oral Keppra, IV hydrate and continue to closely monitor.  Labs shows some mild acute renal insufficiency with an elevated anion gap, we will IV hydrate and repeat a BMP was 2 L has infused.  BMP is much improved  improving creatinine as well as anion gap. We will discharge home with prescriptions for gabapentin as well as Keppra to cover until this week when he sees his doctor.  ____________________________________________   FINAL CLINICAL IMPRESSION(S) / ED DIAGNOSES  Seizure    Harvest Dark, MD 12/24/16 2107

## 2017-01-30 ENCOUNTER — Ambulatory Visit: Payer: PRIVATE HEALTH INSURANCE | Admitting: Pharmacy Technician

## 2017-01-30 ENCOUNTER — Encounter (INDEPENDENT_AMBULATORY_CARE_PROVIDER_SITE_OTHER): Payer: Self-pay

## 2017-01-30 DIAGNOSIS — Z79899 Other long term (current) drug therapy: Secondary | ICD-10-CM

## 2017-01-31 NOTE — Progress Notes (Signed)
Completed [facility] application and contract.  Patient agreed to all terms of the [facility] contract.  Explained to patient that [facility] unable to provide additional medication assistance until proof of income information provided.  Patient acknowledged that he understood.  Patient to provide Social Security Award letter from both himself and spouse and current checking account statement.  Patient eligible for Medicare Parts A, B & D beginning April 27, 2017.  Explained that [facility] would no longer be able to provide medication assistance after 04/27/17 because patient would have Medicare Part D to cover the cost of his medications.  Patient acknowledged that he understood.  Referred patient for Medicare counseling and MTM with pharmacist at [facility]  Patient refused.    Provided patient with Civil engineer, contracting based on his particular needs.    [place] [facility]

## 2017-04-22 ENCOUNTER — Telehealth: Payer: Self-pay | Admitting: Pharmacy Technician

## 2017-04-22 NOTE — Telephone Encounter (Signed)
Patient failed to provide 2018 Social Security Award Letter and last 30 days of checking account statements.  No additional medication assistance will be provided by [facility] without the required proof of income documentation.  Patient notified by letter.  [place] [facility]

## 2017-05-13 DIAGNOSIS — Z8673 Personal history of transient ischemic attack (TIA), and cerebral infarction without residual deficits: Secondary | ICD-10-CM | POA: Diagnosis not present

## 2017-05-13 DIAGNOSIS — I69354 Hemiplegia and hemiparesis following cerebral infarction affecting left non-dominant side: Secondary | ICD-10-CM | POA: Diagnosis not present

## 2017-05-13 DIAGNOSIS — Z8659 Personal history of other mental and behavioral disorders: Secondary | ICD-10-CM | POA: Diagnosis not present

## 2017-05-13 DIAGNOSIS — R569 Unspecified convulsions: Secondary | ICD-10-CM | POA: Diagnosis not present

## 2017-05-13 DIAGNOSIS — F0151 Vascular dementia with behavioral disturbance: Secondary | ICD-10-CM | POA: Diagnosis not present

## 2017-06-06 DIAGNOSIS — E7800 Pure hypercholesterolemia, unspecified: Secondary | ICD-10-CM | POA: Diagnosis not present

## 2017-06-06 DIAGNOSIS — F0151 Vascular dementia with behavioral disturbance: Secondary | ICD-10-CM | POA: Diagnosis not present

## 2017-06-06 DIAGNOSIS — I10 Essential (primary) hypertension: Secondary | ICD-10-CM | POA: Diagnosis not present

## 2017-06-06 DIAGNOSIS — R569 Unspecified convulsions: Secondary | ICD-10-CM | POA: Diagnosis not present

## 2017-06-06 DIAGNOSIS — Z0000 Encounter for general adult medical examination without abnormal findings: Secondary | ICD-10-CM | POA: Diagnosis not present

## 2017-06-06 DIAGNOSIS — I69354 Hemiplegia and hemiparesis following cerebral infarction affecting left non-dominant side: Secondary | ICD-10-CM | POA: Diagnosis not present

## 2017-06-10 ENCOUNTER — Encounter: Payer: Self-pay | Admitting: Occupational Therapy

## 2017-06-10 ENCOUNTER — Ambulatory Visit: Payer: Medicare HMO | Attending: Neurology | Admitting: Occupational Therapy

## 2017-06-10 ENCOUNTER — Other Ambulatory Visit: Payer: Self-pay

## 2017-06-10 DIAGNOSIS — R278 Other lack of coordination: Secondary | ICD-10-CM | POA: Diagnosis not present

## 2017-06-10 DIAGNOSIS — I69354 Hemiplegia and hemiparesis following cerebral infarction affecting left non-dominant side: Secondary | ICD-10-CM

## 2017-06-10 DIAGNOSIS — M6281 Muscle weakness (generalized): Secondary | ICD-10-CM | POA: Insufficient documentation

## 2017-06-14 ENCOUNTER — Ambulatory Visit: Payer: Medicare HMO | Admitting: Occupational Therapy

## 2017-06-14 ENCOUNTER — Encounter: Payer: Self-pay | Admitting: Occupational Therapy

## 2017-06-14 DIAGNOSIS — R278 Other lack of coordination: Secondary | ICD-10-CM | POA: Diagnosis not present

## 2017-06-14 DIAGNOSIS — I69354 Hemiplegia and hemiparesis following cerebral infarction affecting left non-dominant side: Secondary | ICD-10-CM | POA: Diagnosis not present

## 2017-06-14 DIAGNOSIS — M6281 Muscle weakness (generalized): Secondary | ICD-10-CM

## 2017-06-14 NOTE — Therapy (Signed)
[place] MAIN [facility] SERVICES [address] [place], [place], [address] Phone: [phone]   Fax:  [phone]  Occupational Therapy Treatment  Patient Details  Name: Daniel [name] MRN: [id number] Date of Birth: 1955-11-29 Referring Provider: Nicky Pugh   Encounter Date: 06/14/2017  OT End of Session - 06/14/17 1835    Visit Number  2    Number of Visits  16    Date for OT Re-Evaluation  08/09/17    OT Start Time  1019    OT Stop Time  1100    OT Time Calculation (min)  41 min    Activity Tolerance  Patient tolerated treatment well    Behavior During Therapy  [facility] for tasks assessed/performed       Past Medical History:  Diagnosis Date  . Diabetes mellitus without complication ([place])   . Hypertension   . Pancreatitis   . Stroke [facility])     Past Surgical History:  Procedure Laterality Date  . TEE WITHOUT CARDIOVERSION N/A 11/01/2014   Procedure: TRANSESOPHAGEAL ECHOCARDIOGRAM (TEE);  Surgeon: Wellington Hampshire, MD;  Location: [facility] ORS;  Service: Cardiovascular;  Laterality: N/A;    There were no vitals filed for this visit.  Subjective Assessment - 06/14/17 1834    Subjective   Pt. brought in the resting hand splint    Pertinent History  62 year old male who suffered a CVA on October 28, 2014.    Limitations  L arm use.    Currently in Pain?  Yes    Pain Score  6     Pain Location  Shoulder    Pain Orientation  Left    Pain Descriptors / Indicators  Aching    Pain Type  Chronic pain    Pain Frequency  Intermittent       OT TREATMENT    Therapeutic Exercise:  Pt. Tolerated PROM with slow prolonged gentle stretching in all joint ranges of the LUE, and hand.  Selfcare:  Hand hygiene was performed to the Left hand. Pt. education was thoroughly provided about the importance of hand hygiene to promote good skin integrity.   Orthotic Fitting:   Pt. resting hand splint fit was assessed, and the distal portion was  adjusted/modified to accommodate flexor tone in the digits. Pt. and wife were educated about the splint application, care, recommended wearing schedule,and well as skin integrity checks.                     OT Education - 06/14/17 1835    Education provided  Yes    Education Details  LUE ROM, Resting hand splint    Person(s) Educated  Patient    Methods  Explanation    Comprehension  Verbalized understanding                 Plan - 06/14/17 1836    Clinical Impression Statement  Pt. continues to present with increased tone, and tightness throughout his LUE, and hand. Education was provided to the pt. and his wife about the importance of thorough hand hygiene, anmd keeping his fingers nails clipped short in order to promote good skin integrity through the palmar surface of his hand, and decrease the risk of skin irritation, breakdown, or infection. Pt. resting hand splint fit was assessed, and the distal portion was adjusted/modified to accommodate flexor tone in the digits. Pt. and wife were educated about the splint application, care, recommended wearing schedule,and well as  skin integrity checks.     Occupational Profile and client history currently impacting functional performance  lasting effects from past CVA, contracture of left hand, patient with no current home program, noncompliance with splint wear, hand hygiene with long nails and tends to hold hand in flexed position.     Occupational performance deficits (Please refer to evaluation for details):  ADL's;IADL's;Rest and Sleep;Leisure    Rehab Potential  Fair    Current Impairments/barriers affecting progress:  Decreased ROM of left hand both active and passive, decreased functional use of left UE, decreased compliance with splint wear and care    OT Frequency  2x / week    OT Duration  8 weeks    OT Treatment/Interventions  Self-care/ADL training;Moist Heat;DME and/or AE instruction;Splinting;Balance  training;Therapeutic activities;Therapeutic exercise;Cognitive remediation/compensation;Neuromuscular education;Passive range of motion;Manual Therapy;Patient/family education    Clinical Decision Making  Several treatment options, min-mod task modification necessary    Consulted and Agree with Plan of Care  Patient;Family member/caregiver    Family Member Consulted  wife       Patient will benefit from skilled therapeutic intervention in order to improve the following deficits and impairments:  Decreased cognition, Decreased knowledge of use of DME, Decreased skin integrity, Impaired flexibility, Pain, Decreased coordination, Decreased activity tolerance, Decreased endurance, Decreased range of motion, Decreased strength, Decreased balance, Difficulty walking, Impaired UE functional use  Visit Diagnosis: Muscle weakness (generalized)    Problem List Patient Active Problem List   Diagnosis Date Noted  . Stroke ([place]) 10/27/2014  . Lymphadenitis 04/09/2011  . HTN (hypertension) 04/09/2011  . DM (diabetes mellitus) ([place]) 04/09/2011    Harrel Carina, MS, OTR/L 06/14/2017, 6:45 PM  [place] MAIN [facility] SERVICES [address] [place], [place], [address] Phone: [phone]   Fax:  [phone]  Name: Daniel [name] MRN: [id number] Date of Birth: Oct 28, 1955
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
## 2017-06-17 ENCOUNTER — Ambulatory Visit: Payer: Medicare HMO | Admitting: Occupational Therapy

## 2017-06-17 DIAGNOSIS — I69354 Hemiplegia and hemiparesis following cerebral infarction affecting left non-dominant side: Secondary | ICD-10-CM | POA: Diagnosis not present

## 2017-06-17 DIAGNOSIS — R278 Other lack of coordination: Secondary | ICD-10-CM | POA: Diagnosis not present

## 2017-06-17 DIAGNOSIS — M6281 Muscle weakness (generalized): Secondary | ICD-10-CM

## 2017-06-19 ENCOUNTER — Ambulatory Visit: Payer: Medicare HMO | Admitting: Occupational Therapy

## 2017-06-19 DIAGNOSIS — M6281 Muscle weakness (generalized): Secondary | ICD-10-CM

## 2017-06-19 DIAGNOSIS — R278 Other lack of coordination: Secondary | ICD-10-CM

## 2017-06-19 DIAGNOSIS — I69354 Hemiplegia and hemiparesis following cerebral infarction affecting left non-dominant side: Secondary | ICD-10-CM | POA: Diagnosis not present

## 2017-06-20 ENCOUNTER — Encounter: Payer: Self-pay | Admitting: Occupational Therapy

## 2017-06-20 NOTE — Therapy (Signed)
[place] MAIN [facility] SERVICES [address] [place], [place], [address] Phone: [phone]   Fax:  [phone]  Occupational Therapy Treatment  Patient Details  Name: Daniel [name] MRN: [id number] Date of Birth: 1955/09/12 Referring Provider: Nicky Pugh   Encounter Date: 06/19/2017  OT End of Session - 06/20/17 1247    Visit Number  4    Number of Visits  16    Date for OT Re-Evaluation  08/09/17    OT Start Time  1015    OT Stop Time  1100    OT Time Calculation (min)  45 min    Activity Tolerance  Patient tolerated treatment well    Behavior During Therapy  [facility] for tasks assessed/performed       Past Medical History:  Diagnosis Date  . Diabetes mellitus without complication ([place])   . Hypertension   . Pancreatitis   . Stroke [facility])     Past Surgical History:  Procedure Laterality Date  . TEE WITHOUT CARDIOVERSION N/A 11/01/2014   Procedure: TRANSESOPHAGEAL ECHOCARDIOGRAM (TEE);  Surgeon: Wellington Hampshire, MD;  Location: [facility] ORS;  Service: Cardiovascular;  Laterality: N/A;    There were no vitals filed for this visit.  Subjective Assessment - 06/20/17 1244    Subjective   Patient reports he still has not trimmed nails, does not trust his wife to do it states, "She twists my fingers and it hurts."  Patient afraid of other people touching his arm or hand.     Limitations  L arm use.    Patient Stated Goals  Patient wants to be able to open and close his left hand, use it for daily tasks. Decrease pain in shoulder.     Currently in Pain?  Yes    Pain Score  5     Pain Location  Shoulder    Pain Orientation  Left    Pain Descriptors / Indicators  Aching    Pain Type  Chronic pain    Pain Onset  More than a month ago    Pain Frequency  Intermittent    Multiple Pain Sites  No                   OT Treatments/Exercises (OP) - 06/20/17 1249      ADLs   ADL Comments  Patient seen for hand hygiene and nail care.   Patient does not trust anyone to cut his nails.  Therapist worked with patient to trim nails using one handed trimmer, required max assist.  Assist for positioning of hand for optimal use of clippers, wife present and educated on how to keep nails trimmed, filing nails with assist.  Patient and wife reeducated on purpose of keeping nails trim and clean to avoid breakdown in skin with increased spasticity and decrease of bacterial overgrowth in palm.       Neurological Re-education Exercises   Other Exercises 1  Patient seen for PROM of left UE digits, hand, elbow and shoulder on left.  Slow prolonged stretch to left elbow, able to achieve 2 inches from chin today.  Patient instructed on home program for range of motion of left arm.  Cues required for proper technique.  Patient also reeducated on donning of splint with cues for proper positioning.              OT Education - 06/20/17 1247    Education provided  Yes    Education Details  splint  donning, nail care, positioning of hand for nail care    Person(s) Educated  Patient    Methods  Explanation;Demonstration;Verbal cues    Comprehension  Verbal cues required;Returned demonstration;Verbalized understanding          OT Long Term Goals - 06/14/17 1518      OT LONG TERM GOAL #1   Title  Patient will demonstrate ability to perform self directed ROM of LUE to prepare hand for splint use.     Baseline  patient currently not wearing splint, contractures forming in left hand    Time  8    Period  Weeks    Status  New    Target Date  08/09/17      OT LONG TERM GOAL #2   Title  Patient will comply with splint wear, care and schedule with assist to don and doff splint.     Baseline  unable to don splint.     Time  8    Period  Weeks    Status  New    Target Date  08/09/17      OT LONG TERM GOAL #3   Title  Patient will demonstrate understanding of hand hygiene and perform daily with cues.     Baseline  poor hand hygiene at eval      Time  8    Period  Weeks    Status  New    Target Date  08/09/17      OT LONG TERM GOAL #4   Title  Patient will demonstrate UB dressing with modified independence.     Baseline  moderate assist at eval    Time  6    Period  Weeks    Status  New    Target Date  07/25/17            Plan - 06/20/17 1248    Clinical Impression Statement  Patient seen for nail care, was happy that therapist trimmed his nails since he reports not trusting anyone else with his hand especially with increased spasticity.  Patient working on self directed ROM, home program with cues.  Patient planning to focus on elbow flexion/extension over the next few days along with finger extension.  Continue to work towards goals.    Occupational Profile and client history currently impacting functional performance  lasting effects from past CVA, contracture of left hand, patient with no current home program, noncompliance with splint wear, hand hygiene with long nails and tends to hold hand in flexed position.     Occupational performance deficits (Please refer to evaluation for details):  ADL's;IADL's;Rest and Sleep;Leisure    Rehab Potential  Fair    Current Impairments/barriers affecting progress:  Decreased ROM of left hand both active and passive, decreased functional use of left UE, decreased compliance with splint wear and care    OT Frequency  2x / week    OT Duration  8 weeks    OT Treatment/Interventions  Self-care/ADL training;Moist Heat;DME and/or AE instruction;Splinting;Balance training;Therapeutic activities;Therapeutic exercise;Cognitive remediation/compensation;Neuromuscular education;Passive range of motion;Manual Therapy;Patient/family education    Consulted and Agree with Plan of Care  Patient;Family member/caregiver    Family Member Consulted  wife       Patient will benefit from skilled therapeutic intervention in order to improve the following deficits and impairments:  Decreased cognition,  Decreased knowledge of use of DME, Decreased skin integrity, Impaired flexibility, Pain, Decreased coordination, Decreased activity tolerance, Decreased endurance, Decreased range of motion, Decreased strength,  Decreased balance, Difficulty walking, Impaired UE functional use  Visit Diagnosis: Muscle weakness (generalized)  Other lack of coordination  Hemiplegia and hemiparesis following cerebral infarction affecting left non-dominant side [facility])    Problem List Patient Active Problem List   Diagnosis Date Noted  . Stroke ([place]) 10/27/2014  . Lymphadenitis 04/09/2011  . HTN (hypertension) 04/09/2011  . DM (diabetes mellitus) ([place]) 04/09/2011   Muhanad Torosyan T Aleila Syverson, OTR/L, CLT  Kendel Bessey 06/20/2017, 12:56 PM  [place] MAIN [facility] SERVICES [address] [place], [place], [address] Phone: [phone]   Fax:  [phone]  Name: Daniel [name] MRN: [id number] Date of Birth: 1955/11/09

## 2017-06-20 NOTE — Therapy (Signed)
[place] MAIN [facility] SERVICES [address] [place], [place], [address] Phone: [phone]   Fax:  [phone]  Occupational Therapy Treatment  Patient Details  Name: Daniel [name] MRN: [id number] Date of Birth: 07-13-1955 Referring Provider: Nicky Pugh   Encounter Date: 06/17/2017  OT End of Session - 06/20/17 1049    Visit Number  3    Number of Visits  16    Date for OT Re-Evaluation  08/09/17    OT Start Time  0930    OT Stop Time  1015    OT Time Calculation (min)  45 min    Activity Tolerance  Patient tolerated treatment well    Behavior During Therapy  [facility] for tasks assessed/performed       Past Medical History:  Diagnosis Date  . Diabetes mellitus without complication ([place])   . Hypertension   . Pancreatitis   . Stroke [facility])     Past Surgical History:  Procedure Laterality Date  . TEE WITHOUT CARDIOVERSION N/A 11/01/2014   Procedure: TRANSESOPHAGEAL ECHOCARDIOGRAM (TEE);  Surgeon: Wellington Hampshire, MD;  Location: [facility] ORS;  Service: Cardiovascular;  Laterality: N/A;    There were no vitals filed for this visit.  Subjective Assessment - 06/20/17 1048    Subjective   Patient reports he has been trying to wear splint at home but has a hard time getting it off and on despite color coded straps.     Pertinent History  62 year old male who suffered a CVA on October 28, 2014.    Limitations  L arm use.    Patient Stated Goals  Patient wants to be able to open and close his left hand, use it for daily tasks. Decrease pain in shoulder.     Currently in Pain?  Yes    Pain Score  5     Pain Location  Shoulder    Pain Orientation  Left    Pain Descriptors / Indicators  Aching    Pain Type  Chronic pain    Pain Onset  More than a month ago    Pain Frequency  Intermittent    Multiple Pain Sites  No           Reeducated on wear, care and schedule as well as donning and doffing with and without assistance.  Patient has  difficulty with self donning but can remove independently.  Patient seen for nail and hand hygiene and educated on the importance of getting his nails trimmed so they don't interfere with skin integrity.  Patient instructed on range of motion of left UE with emphasis on hand, digits and elbow and patient able to demo with cues.                  OT Education - 06/20/17 1049    Education provided  Yes    Education Details  splint wear, care and schedule, need for nail care    Person(s) Educated  Patient    Methods  Explanation;Demonstration;Verbal cues    Comprehension  Verbal cues required;Returned demonstration;Verbalized understanding          OT Long Term Goals - 06/14/17 1518      OT LONG TERM GOAL #1   Title  Patient will demonstrate ability to perform self directed ROM of LUE to prepare hand for splint use.     Baseline  patient currently not wearing splint, contractures forming in left hand    Time  8    Period  Weeks    Status  New    Target Date  08/09/17      OT LONG TERM GOAL #2   Title  Patient will comply with splint wear, care and schedule with assist to don and doff splint.     Baseline  unable to don splint.     Time  8    Period  Weeks    Status  New    Target Date  08/09/17      OT LONG TERM GOAL #3   Title  Patient will demonstrate understanding of hand hygiene and perform daily with cues.     Baseline  poor hand hygiene at eval     Time  8    Period  Weeks    Status  New    Target Date  08/09/17      OT LONG TERM GOAL #4   Title  Patient will demonstrate UB dressing with modified independence.     Baseline  moderate assist at eval    Time  6    Period  Weeks    Status  New    Target Date  07/25/17            Plan - 06/20/17 1050    Clinical Impression Statement  Patient demonstrates increased passive range of motion with decreased spasticity this date after implementing splint wear last session.  Reeducated on wear, care and  schedule as well as donning and doffing with and without assistance.  Patient has difficulty with self donning but can remove independently.  Patient seen for nail and hand hygiene and educated on the importance of getting his nails trimmed so they don't interfere with skin integrity.  Patient instructed on range of motion of left UE with emphasis on hand, digits and elbow and patient able to demo with cues. Continue to work towards goals to improve LUE functional use, ROM.    Occupational Profile and client history currently impacting functional performance  lasting effects from past CVA, contracture of left hand, patient with no current home program, noncompliance with splint wear, hand hygiene with long nails and tends to hold hand in flexed position.     Occupational performance deficits (Please refer to evaluation for details):  ADL's;IADL's;Rest and Sleep;Leisure    Rehab Potential  Fair    Current Impairments/barriers affecting progress:  Decreased ROM of left hand both active and passive, decreased functional use of left UE, decreased compliance with splint wear and care    OT Frequency  2x / week    OT Duration  8 weeks    OT Treatment/Interventions  Self-care/ADL training;Moist Heat;DME and/or AE instruction;Splinting;Balance training;Therapeutic activities;Therapeutic exercise;Cognitive remediation/compensation;Neuromuscular education;Passive range of motion;Manual Therapy;Patient/family education    Consulted and Agree with Plan of Care  Patient;Family member/caregiver    Family Member Consulted  wife       Patient will benefit from skilled therapeutic intervention in order to improve the following deficits and impairments:  Decreased cognition, Decreased knowledge of use of DME, Decreased skin integrity, Impaired flexibility, Pain, Decreased coordination, Decreased activity tolerance, Decreased endurance, Decreased range of motion, Decreased strength, Decreased balance, Difficulty walking,  Impaired UE functional use  Visit Diagnosis: Muscle weakness (generalized)  Other lack of coordination  Hemiplegia and hemiparesis following cerebral infarction affecting left non-dominant side [facility])    Problem List Patient Active Problem List   Diagnosis Date Noted  . Stroke ([place]) 10/27/2014  . Lymphadenitis 04/09/2011  .  HTN (hypertension) 04/09/2011  . DM (diabetes mellitus) ([place]) 04/09/2011   Daniel [name], OTR/L, CLT  Daniel [name] 06/20/2017, 11:00 AM  [place] MAIN [facility] SERVICES [address] [place], [place], [address] Phone: [phone]   Fax:  [phone]  Name: Daniel [name] MRN: [id number] Date of Birth: 11/08/1955

## 2017-06-26 ENCOUNTER — Encounter: Payer: Medicare HMO | Admitting: Occupational Therapy

## 2017-06-28 ENCOUNTER — Ambulatory Visit: Payer: Medicare HMO | Attending: Neurology | Admitting: Occupational Therapy

## 2017-06-28 DIAGNOSIS — M6281 Muscle weakness (generalized): Secondary | ICD-10-CM

## 2017-06-28 DIAGNOSIS — R278 Other lack of coordination: Secondary | ICD-10-CM | POA: Insufficient documentation

## 2017-06-28 DIAGNOSIS — I69354 Hemiplegia and hemiparesis following cerebral infarction affecting left non-dominant side: Secondary | ICD-10-CM | POA: Insufficient documentation

## 2017-06-29 ENCOUNTER — Encounter: Payer: Self-pay | Admitting: Occupational Therapy

## 2017-06-29 NOTE — Therapy (Signed)
[place] MAIN [facility] SERVICES [address] [place], [place], [address] Phone: [phone]   Fax:  [phone]  Occupational Therapy Treatment  Patient Details  Name: Daniel [name] MRN: [id number] Date of Birth: 02/09/1956 Referring Provider: Nicky Pugh   Encounter Date: 06/28/2017  OT End of Session - 06/29/17 1657    Visit Number  5    Number of Visits  16    Date for OT Re-Evaluation  08/09/17    OT Start Time  1015    OT Stop Time  1100    OT Time Calculation (min)  45 min    Activity Tolerance  Patient tolerated treatment well    Behavior During Therapy  [facility] for tasks assessed/performed       Past Medical History:  Diagnosis Date  . Diabetes mellitus without complication ([place])   . Hypertension   . Pancreatitis   . Stroke [facility])     Past Surgical History:  Procedure Laterality Date  . TEE WITHOUT CARDIOVERSION N/A 11/01/2014   Procedure: TRANSESOPHAGEAL ECHOCARDIOGRAM (TEE);  Surgeon: Wellington Hampshire, MD;  Location: [facility] ORS;  Service: Cardiovascular;  Laterality: N/A;    There were no vitals filed for this visit.  Subjective Assessment - 06/29/17 1655    Subjective   Patient states, "My hand is feeling a lot looser."    Pertinent History  62 year old male who suffered a CVA on October 28, 2014.    Patient Stated Goals  Patient wants to be able to open and close his left hand, use it for daily tasks. Decrease pain in shoulder.     Currently in Pain?  No/denies    Pain Score  0-No pain                   OT Treatments/Exercises (OP) - 06/29/17 1656      Neurological Re-education Exercises   Other Exercises 1  Patient seen for PROM of left UE digits, hand, elbow and shoulder on left.  Slow prolonged stretch to left elbow, able to achieve 2 inches from chin today.  Patient instructed on home program for range of motion of left arm.  Cues required for proper technique.              OT Education - 06/29/17  1657    Education provided  Yes    Education Details  ROM, HEP    Person(s) Educated  Patient;Spouse    Methods  Explanation;Demonstration;Verbal cues    Comprehension  Verbal cues required;Returned demonstration;Verbalized understanding          OT Long Term Goals - 06/14/17 1518      OT LONG TERM GOAL #1   Title  Patient will demonstrate ability to perform self directed ROM of LUE to prepare hand for splint use.     Baseline  patient currently not wearing splint, contractures forming in left hand    Time  8    Period  Weeks    Status  New    Target Date  08/09/17      OT LONG TERM GOAL #2   Title  Patient will comply with splint wear, care and schedule with assist to don and doff splint.     Baseline  unable to don splint.     Time  8    Period  Weeks    Status  New    Target Date  08/09/17      OT  LONG TERM GOAL #3   Title  Patient will demonstrate understanding of hand hygiene and perform daily with cues.     Baseline  poor hand hygiene at eval     Time  8    Period  Weeks    Status  New    Target Date  08/09/17      OT LONG TERM GOAL #4   Title  Patient will demonstrate UB dressing with modified independence.     Baseline  moderate assist at eval    Time  6    Period  Weeks    Status  New    Target Date  07/25/17            Plan - 06/29/17 1657    Clinical Impression Statement  Patient continues to demonstrate progress towards goals.     Occupational Profile and client history currently impacting functional performance  lasting effects from past CVA, contracture of left hand, patient with no current home program, noncompliance with splint wear, hand hygiene with long nails and tends to hold hand in flexed position.     Occupational performance deficits (Please refer to evaluation for details):  ADL's;IADL's;Rest and Sleep;Leisure    Rehab Potential  Fair    Current Impairments/barriers affecting progress:  Decreased ROM of left hand both active and  passive, decreased functional use of left UE, decreased compliance with splint wear and care    OT Frequency  2x / week    OT Duration  8 weeks    OT Treatment/Interventions  Self-care/ADL training;Moist Heat;DME and/or AE instruction;Splinting;Balance training;Therapeutic activities;Therapeutic exercise;Cognitive remediation/compensation;Neuromuscular education;Passive range of motion;Manual Therapy;Patient/family education    Consulted and Agree with Plan of Care  Patient;Family member/caregiver    Family Member Consulted  wife       Patient will benefit from skilled therapeutic intervention in order to improve the following deficits and impairments:  Decreased cognition, Decreased knowledge of use of DME, Decreased skin integrity, Impaired flexibility, Pain, Decreased coordination, Decreased activity tolerance, Decreased endurance, Decreased range of motion, Decreased strength, Decreased balance, Difficulty walking, Impaired UE functional use  Visit Diagnosis: Muscle weakness (generalized)  Other lack of coordination  Hemiplegia and hemiparesis following cerebral infarction affecting left non-dominant side [facility])    Problem List Patient Active Problem List   Diagnosis Date Noted  . Stroke ([place]) 10/27/2014  . Lymphadenitis 04/09/2011  . HTN (hypertension) 04/09/2011  . DM (diabetes mellitus) ([place]) 04/09/2011   Amy T Lovett, OTR/L, CLT  Lovett,Amy 06/29/2017, 4:59 PM  [place] MAIN [facility] SERVICES [address] [place], [place], [address] Phone: [phone]   Fax:  [phone]  Name: Daniel [name] MRN: [id number] Date of Birth: [birth year]

## 2017-07-01 ENCOUNTER — Ambulatory Visit: Payer: Medicare HMO | Admitting: Occupational Therapy

## 2017-07-03 ENCOUNTER — Encounter: Payer: Self-pay | Admitting: Occupational Therapy

## 2017-07-03 ENCOUNTER — Ambulatory Visit: Payer: Medicare HMO | Admitting: Occupational Therapy

## 2017-07-03 DIAGNOSIS — I69354 Hemiplegia and hemiparesis following cerebral infarction affecting left non-dominant side: Secondary | ICD-10-CM | POA: Diagnosis not present

## 2017-07-03 DIAGNOSIS — R278 Other lack of coordination: Secondary | ICD-10-CM | POA: Diagnosis not present

## 2017-07-03 DIAGNOSIS — M6281 Muscle weakness (generalized): Secondary | ICD-10-CM | POA: Diagnosis not present

## 2017-07-03 NOTE — Therapy (Signed)
[place] MAIN [facility] SERVICES [address] [place], [place], [address] Phone: [phone]   Fax:  [phone]  Occupational Therapy Treatment  Patient Details  Name: Daniel [name] MRN: [id number] Date of Birth: 22-Jan-1956 Referring Provider: Nicky Pugh   Encounter Date: 07/03/2017  OT End of Session - 07/03/17 1247    Visit Number  6    Number of Visits  16    Date for OT Re-Evaluation  08/09/17    OT Start Time  1015    OT Stop Time  1105    OT Time Calculation (min)  50 min    Activity Tolerance  Patient tolerated treatment well    Behavior During Therapy  [facility] for tasks assessed/performed       Past Medical History:  Diagnosis Date  . Diabetes mellitus without complication ([place])   . Hypertension   . Pancreatitis   . Stroke [facility])     Past Surgical History:  Procedure Laterality Date  . TEE WITHOUT CARDIOVERSION N/A 11/01/2014   Procedure: TRANSESOPHAGEAL ECHOCARDIOGRAM (TEE);  Surgeon: Wellington Hampshire, MD;  Location: [facility] ORS;  Service: Cardiovascular;  Laterality: N/A;    There were no vitals filed for this visit.  Subjective Assessment - 07/03/17 1245    Subjective   Pt. reports his left hand is feeling better.    Pertinent History  62 year old male who suffered a CVA on October 28, 2014.    Limitations  L arm use.    Patient Stated Goals  Patient wants to be able to open and close his left hand, use it for daily tasks. Decrease pain in shoulder.     Currently in Pain?  No/denies       OT TREATMENT    Neuro muscular re-education:  Pt. Tolerated weightbearing, and proprioceptive input through the LUE to normalize tone in preparation for ROM.   Therapeutic Exercise:  Pt. Tolerated PROM in all joint ranges of the LUE, and hand. Pt. Responded well to slow gentle stretching.                         OT Education - 07/03/17 1246    Education provided  Yes    Education Details  ROM, HEP    Person(s)  Educated  Patient;Spouse    Methods  Demonstration;Explanation;Verbal cues    Comprehension  Verbalized understanding;Returned demonstration;Verbal cues required          OT Long Term Goals - 06/14/17 1518      OT LONG TERM GOAL #1   Title  Patient will demonstrate ability to perform self directed ROM of LUE to prepare hand for splint use.     Baseline  patient currently not wearing splint, contractures forming in left hand    Time  8    Period  Weeks    Status  New    Target Date  08/09/17      OT LONG TERM GOAL #2   Title  Patient will comply with splint wear, care and schedule with assist to don and doff splint.     Baseline  unable to don splint.     Time  8    Period  Weeks    Status  New    Target Date  08/09/17      OT LONG TERM GOAL #3   Title  Patient will demonstrate understanding of hand hygiene and perform daily with cues.  Baseline  poor hand hygiene at eval     Time  8    Period  Weeks    Status  New    Target Date  08/09/17      OT LONG TERM GOAL #4   Title  Patient will demonstrate UB dressing with modified independence.     Baseline  moderate assist at eval    Time  6    Period  Weeks    Status  New    Target Date  07/25/17            Plan - 07/03/17 1248    Clinical Impression Statement Pt. education was provided about splinting. Pt. is making progress  with his LUE, and hand.  Pt. continues to present with increased flexor tone, and tightness which hinders facilitation of any active movement. Pt. Education was provided about self ROM.   Occupational Profile and client history currently impacting functional performance  lasting effects from past CVA, contracture of left hand, patient with no current home program, noncompliance with splint wear, hand hygiene with long nails and tends to hold hand in flexed position.     Occupational performance deficits (Please refer to evaluation for details):  ADL's;IADL's;Rest and Sleep;Leisure    Rehab  Potential  Fair    Current Impairments/barriers affecting progress:  Decreased ROM of left hand both active and passive, decreased functional use of left UE, decreased compliance with splint wear and care    OT Frequency  2x / week    OT Duration  8 weeks    OT Treatment/Interventions  Self-care/ADL training;Moist Heat;DME and/or AE instruction;Splinting;Balance training;Therapeutic activities;Therapeutic exercise;Cognitive remediation/compensation;Neuromuscular education;Passive range of motion;Manual Therapy;Patient/family education    Clinical Decision Making  Several treatment options, min-mod task modification necessary    Consulted and Agree with Plan of Care  Patient;Family member/caregiver    Family Member Consulted  wife       Patient will benefit from skilled therapeutic intervention in order to improve the following deficits and impairments:  Decreased cognition, Decreased knowledge of use of DME, Decreased skin integrity, Impaired flexibility, Pain, Decreased coordination, Decreased activity tolerance, Decreased endurance, Decreased range of motion, Decreased strength, Decreased balance, Difficulty walking, Impaired UE functional use  Visit Diagnosis: Muscle weakness (generalized)    Problem List Patient Active Problem List   Diagnosis Date Noted  . Stroke ([place]) 10/27/2014  . Lymphadenitis 04/09/2011  . HTN (hypertension) 04/09/2011  . DM (diabetes mellitus) ([place]) 04/09/2011    Harrel Carina, MS, OTR/L 07/03/2017, 1:03 PM  [place] MAIN [facility] SERVICES [address] [place], [place], [address] Phone: [phone]   Fax:  [phone]  Name: TYAIRE ODEM MRN: [id number] Date of Birth: August 04, 1955

## 2017-07-08 ENCOUNTER — Ambulatory Visit: Payer: Medicare HMO | Admitting: Occupational Therapy

## 2017-07-08 DIAGNOSIS — Z0000 Encounter for general adult medical examination without abnormal findings: Secondary | ICD-10-CM | POA: Diagnosis not present

## 2017-07-10 ENCOUNTER — Ambulatory Visit: Payer: Medicare HMO | Admitting: Occupational Therapy

## 2017-07-10 ENCOUNTER — Encounter: Payer: Self-pay | Admitting: Occupational Therapy

## 2017-07-10 DIAGNOSIS — R278 Other lack of coordination: Secondary | ICD-10-CM | POA: Diagnosis not present

## 2017-07-10 DIAGNOSIS — M6281 Muscle weakness (generalized): Secondary | ICD-10-CM | POA: Diagnosis not present

## 2017-07-10 DIAGNOSIS — I69354 Hemiplegia and hemiparesis following cerebral infarction affecting left non-dominant side: Secondary | ICD-10-CM | POA: Diagnosis not present

## 2017-07-10 NOTE — Therapy (Signed)
[place] MAIN [facility] SERVICES [address] [place], [place], [address] Phone: [phone]   Fax:  [phone]  Occupational Therapy Treatment  Patient Details  Name: Daniel [name] MRN: [id number] Date of Birth: 04-Feb-1956 Referring Provider: Nicky Pugh   Encounter Date: 07/10/2017  OT End of Session - 07/10/17 1108    Visit Number  7    Number of Visits  16    Date for OT Re-Evaluation  08/09/17    OT Start Time  1018    OT Stop Time  1100    OT Time Calculation (min)  42 min    Activity Tolerance  Patient tolerated treatment well    Behavior During Therapy  [facility] for tasks assessed/performed       Past Medical History:  Diagnosis Date  . Diabetes mellitus without complication ([place])   . Hypertension   . Pancreatitis   . Stroke [facility])     Past Surgical History:  Procedure Laterality Date  . TEE WITHOUT CARDIOVERSION N/A 11/01/2014   Procedure: TRANSESOPHAGEAL ECHOCARDIOGRAM (TEE);  Surgeon: Wellington Hampshire, MD;  Location: [facility] ORS;  Service: Cardiovascular;  Laterality: N/A;    There were no vitals filed for this visit.  Subjective Assessment - 07/10/17 1106    Subjective   Pt. reports his left hand is feeling better.    Pertinent History  62 year old male who suffered a CVA on October 28, 2014.    Limitations  L arm use.    Patient Stated Goals  Patient wants to be able to open and close his left hand, use it for daily tasks. Decrease pain in shoulder.     Currently in Pain?  No/denies      OT TREATMENT    Neuro muscular re-education:  Pt. Tolerated weightbearing, and proprioceptive input through the LUE to normalize tone in preparation for ROM.   Therapeutic Exercise:  Pt. Tolerated PROM in all joint ranges of the LUE, and hand. Pt. Responded well to slow gentle stretching, with an inhibitive affect on the tone. Pt., and wife were educated about  self-ROM.                              OT Education - 07/10/17 1107    Education provided  Yes    Education Details  ROM, UE ther. ex.    Person(s) Educated  Patient;Spouse    Methods  Demonstration;Verbal cues    Comprehension  Verbalized understanding;Returned demonstration;Verbal cues required          OT Long Term Goals - 06/14/17 1518      OT LONG TERM GOAL #1   Title  Patient will demonstrate ability to perform self directed ROM of LUE to prepare hand for splint use.     Baseline  patient currently not wearing splint, contractures forming in left hand    Time  8    Period  Weeks    Status  New    Target Date  08/09/17      OT LONG TERM GOAL #2   Title  Patient will comply with splint wear, care and schedule with assist to don and doff splint.     Baseline  unable to don splint.     Time  8    Period  Weeks    Status  New    Target Date  08/09/17      OT LONG  TERM GOAL #3   Title  Patient will demonstrate understanding of hand hygiene and perform daily with cues.     Baseline  poor hand hygiene at eval     Time  8    Period  Weeks    Status  New    Target Date  08/09/17      OT LONG TERM GOAL #4   Title  Patient will demonstrate UB dressing with modified independence.     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 Pt. reports he started on the generic of symbalta, and feels better. Pt. continues to present with tightness, and increased tone throughout the LUE, and hand. Pt. was able to tolerate PROM.    Occupational Profile and client history currently impacting functional performance  lasting effects from past CVA, contracture of left hand, patient with no current home program, noncompliance with  splint wear, hand hygiene with long nails and tends to hold hand in flexed position.     Occupational performance deficits (Please refer to evaluation for details):  ADL's;IADL's;Rest and Sleep;Leisure    Rehab Potential  Fair    Current Impairments/barriers affecting progress:  Decreased ROM of left hand both active and passive, decreased functional use of left UE, decreased compliance with splint wear and care    OT Frequency  2x / week    OT Duration  8 weeks    OT Treatment/Interventions  Self-care/ADL training;Moist Heat;DME and/or AE instruction;Splinting;Balance training;Therapeutic activities;Therapeutic exercise;Cognitive remediation/compensation;Neuromuscular education;Passive range of motion;Manual Therapy;Patient/family education    Clinical Decision Making  Several treatment options, min-mod task modification necessary    Consulted and Agree with Plan of Care  Patient;Family member/caregiver    Family Member Consulted  wife       Patient will benefit from skilled therapeutic intervention in order to improve the following deficits and impairments:  Decreased cognition, Decreased knowledge of use of DME, Decreased skin integrity, Impaired flexibility, Pain, Decreased coordination, Decreased activity tolerance, Decreased endurance, Decreased range of motion, Decreased strength, Decreased balance, Difficulty walking, Impaired UE functional use  Visit Diagnosis: Muscle weakness (generalized)    Problem List Patient Active Problem List   Diagnosis Date Noted  . Stroke ([place]) 10/27/2014  . Lymphadenitis 04/09/2011  . HTN (hypertension) 04/09/2011  . DM (diabetes mellitus) ([place]) 04/09/2011    Harrel Carina, MS, OTR/L 07/10/2017, 1:29 PM  [place] MAIN [facility] SERVICES [address] [place], [place], [address] Phone: [phone]   Fax:  [phone]  Name: Daniel [name] MRN: [id number] Date of Birth: [birth year]

## 2017-07-15 ENCOUNTER — Encounter: Payer: Self-pay | Admitting: Occupational Therapy

## 2017-07-15 ENCOUNTER — Ambulatory Visit: Payer: Medicare HMO | Admitting: Occupational Therapy

## 2017-07-15 DIAGNOSIS — M6281 Muscle weakness (generalized): Secondary | ICD-10-CM

## 2017-07-15 DIAGNOSIS — R278 Other lack of coordination: Secondary | ICD-10-CM | POA: Diagnosis not present

## 2017-07-15 DIAGNOSIS — I69354 Hemiplegia and hemiparesis following cerebral infarction affecting left non-dominant side: Secondary | ICD-10-CM | POA: Diagnosis not present

## 2017-07-15 NOTE — Therapy (Addendum)
[place] MAIN [facility] SERVICES [address] [place], [place], [address] Phone: [phone]   Fax:  [phone]  Occupational Therapy Treatment  Patient Details  Name: Daniel [name] MRN: [id number] Date of Birth: 02/28/1956 Referring Provider: Nicky Pugh   Encounter Date: 07/15/2017  OT End of Session - 07/15/17 1154    Visit Number  8    Number of Visits  16    Date for OT Re-Evaluation  08/09/17    OT Start Time  1145    OT Stop Time  1230    OT Time Calculation (min)  45 min    Activity Tolerance  Patient tolerated treatment well    Behavior During Therapy  [facility] for tasks assessed/performed       Past Medical History:  Diagnosis Date  . Diabetes mellitus without complication ([place])   . Hypertension   . Pancreatitis   . Stroke [facility])     Past Surgical History:  Procedure Laterality Date  . TEE WITHOUT CARDIOVERSION N/A 11/01/2014   Procedure: TRANSESOPHAGEAL ECHOCARDIOGRAM (TEE);  Surgeon: Wellington Hampshire, MD;  Location: [facility] ORS;  Service: Cardiovascular;  Laterality: N/A;    There were no vitals filed for this visit.  Subjective Assessment - 07/15/17 1153    Subjective   Pt. is present with his wife    Pertinent History  62 year old male who suffered a CVA on October 28, 2014.    Limitations  L arm use.    Patient Stated Goals  Patient wants to be able to open and close his left hand, use it for daily tasks. Decrease pain in shoulder.     Currently in Pain?  No/denies       OT TREATMENT    Therapeutic Exercise:   Therapeutic Exercise:  Pt. Tolerated PROM in all joint ranges of the LUE, and hand. Pt. Responded well to slow gentle stretching, with an inhibitive affect on the tone. Pt., and wife were educated about self-ROM.   Manual Therapy:  Pt. Tolerated soft tissue, and joint mobilizations for forearm radius on ulnar for supination, metacarpal spread stretches to prepare the LUE for  ROM.                        OT Education - 07/15/17 1154    Education provided  Yes    Education Details  ROM    Person(s) Educated  Patient;Spouse    Methods  Explanation;Verbal cues    Comprehension  Returned demonstration;Verbal cues required          OT Long Term Goals - 06/14/17 1518      OT LONG TERM GOAL #1   Title  Patient will demonstrate ability to perform self directed ROM of LUE to prepare hand for splint use.     Baseline  patient currently not wearing splint, contractures forming in left hand    Time  8    Period  Weeks    Status  New    Target Date  08/09/17      OT LONG TERM GOAL #2   Title  Patient will comply with splint wear, care and schedule with assist to don and doff splint.     Baseline  unable to don splint.     Time  8    Period  Weeks    Status  New    Target Date  08/09/17      OT LONG TERM  GOAL #3   Title  Patient will demonstrate understanding of hand hygiene and perform daily with cues.     Baseline  poor hand hygiene at eval     Time  8    Period  Weeks    Status  New    Target Date  08/09/17      OT LONG TERM GOAL #4   Title  Patient will demonstrate UB dressing with modified independence.     Baseline  moderate assist at eval    Time  6    Period  Weeks    Status  New    Target Date  07/25/17            Plan - 07/15/17 1154    Clinical Impression Statement  Pt. reports he used his left hand to turn a light switch, and to carry a coffee cup to, and from the kitchen. Pt. conitnues to present with increased flexor tone, and tightness through the LUE, and hand.     Occupational Profile and client history currently impacting functional performance  lasting effects from past CVA, contracture of left hand, patient with no current home program, noncompliance with splint wear, hand hygiene with long nails and tends to hold hand in flexed position.     Occupational performance deficits (Please refer to evaluation  for details):  ADL's;IADL's;Rest and Sleep;Leisure    Current Impairments/barriers affecting progress:  Decreased ROM of left hand both active and passive, decreased functional use of left UE, decreased compliance with splint wear and care    OT Frequency  2x / week    OT Duration  8 weeks    OT Treatment/Interventions  Self-care/ADL training;Moist Heat;DME and/or AE instruction;Splinting;Balance training;Therapeutic activities;Therapeutic exercise;Cognitive remediation/compensation;Neuromuscular education;Passive range of motion;Manual Therapy;Patient/family education    Clinical Decision Making  Several treatment options, min-mod task modification necessary    Consulted and Agree with Plan of Care  Patient;Family member/caregiver    Family Member Consulted  wife       Patient will benefit from skilled therapeutic intervention in order to improve the following deficits and impairments:  Decreased cognition, Decreased knowledge of use of DME, Decreased skin integrity, Impaired flexibility, Pain, Decreased coordination, Decreased activity tolerance, Decreased endurance, Decreased range of motion, Decreased strength, Decreased balance, Difficulty walking, Impaired UE functional use  Visit Diagnosis: Muscle weakness (generalized)    Problem List Patient Active Problem List   Diagnosis Date Noted  . Stroke ([place]) 10/27/2014  . Lymphadenitis 04/09/2011  . HTN (hypertension) 04/09/2011  . DM (diabetes mellitus) ([place]) 04/09/2011    Harrel Carina, MS, OTR/L 07/15/2017, 12:43 PM  [place] MAIN [facility] SERVICES [address] [place], [place], [address] Phone: [phone]   Fax:  [phone]  Name: Daniel [name] MRN: [id number] Date of Birth: [birth year]

## 2017-07-17 ENCOUNTER — Ambulatory Visit: Payer: Medicare HMO | Admitting: Occupational Therapy

## 2017-07-18 DIAGNOSIS — Z125 Encounter for screening for malignant neoplasm of prostate: Secondary | ICD-10-CM | POA: Diagnosis not present

## 2017-07-18 DIAGNOSIS — Z0000 Encounter for general adult medical examination without abnormal findings: Secondary | ICD-10-CM | POA: Diagnosis not present

## 2017-07-18 DIAGNOSIS — E7800 Pure hypercholesterolemia, unspecified: Secondary | ICD-10-CM | POA: Diagnosis not present

## 2017-07-18 DIAGNOSIS — I10 Essential (primary) hypertension: Secondary | ICD-10-CM | POA: Diagnosis not present

## 2017-07-18 DIAGNOSIS — R739 Hyperglycemia, unspecified: Secondary | ICD-10-CM | POA: Diagnosis not present

## 2017-07-19 ENCOUNTER — Other Ambulatory Visit: Payer: Self-pay

## 2017-07-19 ENCOUNTER — Other Ambulatory Visit: Payer: Self-pay | Admitting: Internal Medicine

## 2017-07-19 DIAGNOSIS — N19 Unspecified kidney failure: Secondary | ICD-10-CM

## 2017-07-22 ENCOUNTER — Ambulatory Visit: Payer: Medicare HMO | Admitting: Occupational Therapy

## 2017-07-22 ENCOUNTER — Encounter: Payer: Self-pay | Admitting: Occupational Therapy

## 2017-07-22 DIAGNOSIS — I69354 Hemiplegia and hemiparesis following cerebral infarction affecting left non-dominant side: Secondary | ICD-10-CM | POA: Diagnosis not present

## 2017-07-22 DIAGNOSIS — M6281 Muscle weakness (generalized): Secondary | ICD-10-CM

## 2017-07-22 DIAGNOSIS — N19 Unspecified kidney failure: Secondary | ICD-10-CM | POA: Diagnosis not present

## 2017-07-22 DIAGNOSIS — R278 Other lack of coordination: Secondary | ICD-10-CM | POA: Diagnosis not present

## 2017-07-22 NOTE — Therapy (Signed)
[place] MAIN [facility] SERVICES [address] [place], [place], [address] Phone: [phone]   Fax:  [phone]  Occupational Therapy Treatment  Patient Details  Name: Daniel [name] MRN: [id number] Date of Birth: 1955/11/20 Referring Provider: Nicky Pugh   Encounter Date: 07/22/2017  OT End of Session - 07/22/17 1157    Visit Number  8    Number of Visits  16    Date for OT Re-Evaluation  08/09/17    OT Start Time  1145    OT Stop Time  1230    OT Time Calculation (min)  45 min    Activity Tolerance  Patient tolerated treatment well    Behavior During Therapy  [facility] for tasks assessed/performed       Past Medical History:  Diagnosis Date  . Diabetes mellitus without complication ([place])   . Hypertension   . Pancreatitis   . Stroke [facility])     Past Surgical History:  Procedure Laterality Date  . TEE WITHOUT CARDIOVERSION N/A 11/01/2014   Procedure: TRANSESOPHAGEAL ECHOCARDIOGRAM (TEE);  Surgeon: Wellington Hampshire, MD;  Location: [facility] ORS;  Service: Cardiovascular;  Laterality: N/A;    There were no vitals filed for this visit.  Subjective Assessment - 07/22/17 1154    Subjective   Pt. was very early for session. Pt. reports he doesnt like being so early.    Pertinent History  62 year old male who suffered a CVA on October 28, 2014.    Limitations  L arm use.    Currently in Pain?  No/denies         Therapeutic Exercise:  Pt. Tolerated PROM in all joint ranges of the LUE, and hand. Pt. Responded well to slow gentle stretching, with an inhibitive affect on the tone. Pt., and wife were educated about self-ROM.   Manual Therapy:  Pt. Tolerated soft tissue, and joint mobilizations for forearm radius on ulnar for supination, metacarpal spread stretches to prepare the LUE for ROM.                         OT Education - 07/22/17 1156    Education provided  Yes    Education Details  ROM    Person(s)  Educated  Patient;Spouse    Methods  Verbal cues    Comprehension  Returned demonstration          OT Long Term Goals - 06/14/17 1518      OT LONG TERM GOAL #1   Title  Patient will demonstrate ability to perform self directed ROM of LUE to prepare hand for splint use.     Baseline  patient currently not wearing splint, contractures forming in left hand    Time  8    Period  Weeks    Status  New    Target Date  08/09/17      OT LONG TERM GOAL #2   Title  Patient will comply with splint wear, care and schedule with assist to don and doff splint.     Baseline  unable to don splint.     Time  8    Period  Weeks    Status  New    Target Date  08/09/17      OT LONG TERM GOAL #3   Title  Patient will demonstrate understanding of hand hygiene and perform daily with cues.     Baseline  poor hand hygiene at eval  Time  8    Period  Weeks    Status  New    Target Date  08/09/17      OT LONG TERM GOAL #4   Title  Patient will demonstrate UB dressing with modified independence.     Baseline  moderate assist at eval    Time  6    Period  Weeks    Status  New    Target Date  07/25/17            Plan - 07/22/17 1157    Clinical Impression Statement Pt. is attempting to use his left hand more fore tasks at home. Pt. reports he tried to use his cane with his Left hand. Pt. continues to present with increased tone, and tightness throughout his LUE, and hand. Pt. continues to work on improving tone, increasing ROM, and preparing the LUE for improved functional use during ADLs, and IADL tasks.     Occupational performance deficits (Please refer to evaluation for details):  ADL's;IADL's;Rest and Sleep;Leisure    Rehab Potential  Fair    Current Impairments/barriers affecting progress:  Decreased ROM of left hand both active and passive, decreased functional use of left UE, decreased compliance with splint wear and care    OT Frequency  2x / week    OT Duration  8 weeks    OT  Treatment/Interventions  Self-care/ADL training;Moist Heat;DME and/or AE instruction;Splinting;Balance training;Therapeutic activities;Therapeutic exercise;Cognitive remediation/compensation;Neuromuscular education;Passive range of motion;Manual Therapy;Patient/family education    Clinical Decision Making  Several treatment options, min-mod task modification necessary    Consulted and Agree with Plan of Care  Patient;Family member/caregiver    Family Member Consulted  wife       Patient will benefit from skilled therapeutic intervention in order to improve the following deficits and impairments:  Decreased cognition, Decreased knowledge of use of DME, Decreased skin integrity, Impaired flexibility, Pain, Decreased coordination, Decreased activity tolerance, Decreased endurance, Decreased range of motion, Decreased strength, Decreased balance, Difficulty walking, Impaired UE functional use  Visit Diagnosis: Muscle weakness (generalized)    Problem List Patient Active Problem List   Diagnosis Date Noted  . Stroke ([place]) 10/27/2014  . Lymphadenitis 04/09/2011  . HTN (hypertension) 04/09/2011  . DM (diabetes mellitus) ([place]) 04/09/2011    Harrel Carina, MS, OTR/L 07/22/2017, 5:39 PM  [place] MAIN [facility] SERVICES [address] [place], [place], [address] Phone: [phone]   Fax:  [phone]  Name: LUIZ TRUMPOWER MRN: [id number] Date of Birth: [birth year]

## 2017-07-24 ENCOUNTER — Ambulatory Visit (INDEPENDENT_AMBULATORY_CARE_PROVIDER_SITE_OTHER): Payer: Medicare HMO | Admitting: Urology

## 2017-07-24 ENCOUNTER — Encounter: Payer: Self-pay | Admitting: Occupational Therapy

## 2017-07-24 ENCOUNTER — Encounter: Payer: Self-pay | Admitting: Urology

## 2017-07-24 ENCOUNTER — Ambulatory Visit: Payer: Medicare HMO | Admitting: Occupational Therapy

## 2017-07-24 VITALS — BP 98/63 | HR 72 | Ht 68.0 in | Wt 211.1 lb

## 2017-07-24 DIAGNOSIS — M6281 Muscle weakness (generalized): Secondary | ICD-10-CM | POA: Diagnosis not present

## 2017-07-24 DIAGNOSIS — R972 Elevated prostate specific antigen [PSA]: Secondary | ICD-10-CM | POA: Diagnosis not present

## 2017-07-24 DIAGNOSIS — R278 Other lack of coordination: Secondary | ICD-10-CM | POA: Diagnosis not present

## 2017-07-24 DIAGNOSIS — N138 Other obstructive and reflux uropathy: Secondary | ICD-10-CM

## 2017-07-24 DIAGNOSIS — N401 Enlarged prostate with lower urinary tract symptoms: Secondary | ICD-10-CM

## 2017-07-24 DIAGNOSIS — I69354 Hemiplegia and hemiparesis following cerebral infarction affecting left non-dominant side: Secondary | ICD-10-CM | POA: Diagnosis not present

## 2017-07-24 LAB — BLADDER SCAN AMB NON-IMAGING: Scan Result: 2

## 2017-07-24 MED ORDER — TAMSULOSIN HCL 0.4 MG PO CAPS: 0.4000 mg | ORAL_CAPSULE | Freq: Every day | ORAL | 11 refills | Status: AC

## 2017-07-24 NOTE — Therapy (Deleted)
[place] MAIN [facility] SERVICES [address] [place], [place], [address] Phone: [phone]   Fax:  [phone]  Occupational Therapy Treatment/Recertification Note  Patient Details  Name: Daniel [name] MRN: [id number] Date of Birth: 1955/08/25 Referring Provider: Nicky Pugh   Encounter Date: 07/24/2017  OT End of Session - 07/24/17 1451    Visit Number  9    Number of Visits  16    Date for OT Re-Evaluation  08/09/17    OT Start Time  1435    OT Stop Time  1520    OT Time Calculation (min)  45 min    Activity Tolerance  Patient tolerated treatment well    Behavior During Therapy  [facility] for tasks assessed/performed       Past Medical History:  Diagnosis Date  . Diabetes mellitus without complication ([place])   . Hypertension   . Pancreatitis   . Stroke [facility])     Past Surgical History:  Procedure Laterality Date  . TEE WITHOUT CARDIOVERSION N/A 11/01/2014   Procedure: TRANSESOPHAGEAL ECHOCARDIOGRAM (TEE);  Surgeon: Wellington Hampshire, MD;  Location: [facility] ORS;  Service: Cardiovascular;  Laterality: N/A;    There were no vitals filed for this visit.  Subjective Assessment - 07/24/17 1450    Subjective   Pt. reports watches a lot of T.V. at home.    Pertinent History  62 year old male who suffered a CVA on October 28, 2014.    Limitations  L arm use.    Currently in Pain?  No/denies      OT TREATMENT    Pt. Education was provided about the importance of keeping nails clipped, and thoroughly washing hs hand to promote, and maintan good skin integrity through the palmar aspect of his left hand.  Therapeutic Exercise:  Pt. Tolerated PROM in all joint ranges of the LUE, and hand. Pt. Responded well to slow gentle stretching, with an inhibitive affect on the tone. Pt., and wife were educated about self-ROM techniques.  Manual Therapy:  Pt. Tolerated soft tissue, and joint mobilizations for radius on ulnar for forearm supination, and  metacarpal spread stretches to prepare the LUE for ROM.                       OT Education - 07/24/17 1451    Education provided  Yes    Education Details  LUE ROM    Person(s) Educated  Patient;Spouse    Methods  Verbal cues    Comprehension  Returned demonstration          OT Long Term Goals - 06/14/17 1518      OT LONG TERM GOAL #1   Title  Patient will demonstrate ability to perform self directed ROM of LUE to prepare hand for splint use.     Baseline  patient currently not wearing splint, contractures forming in left hand    Time  8    Period  Weeks    Status  New    Target Date  08/09/17      OT LONG TERM GOAL #2   Title  Patient will comply with splint wear, care and schedule with assist to don and doff splint.     Baseline  unable to don splint.     Time  8    Period  Weeks    Status  New    Target Date  08/09/17      OT LONG  TERM GOAL #3   Title  Patient will demonstrate understanding of hand hygiene and perform daily with cues.     Baseline  poor hand hygiene at eval     Time  8    Period  Weeks    Status  New    Target Date  08/09/17      OT LONG TERM GOAL #4   Title  Patient will demonstrate UB dressing with modified independence.     Baseline  moderate assist at eval    Time  6    Period  Weeks    Status  New    Target Date  07/25/17            Plan - 07/24/17 1452    Clinical Impression Statement  Pt. reports that he is using his left hand to hold his cane. Pt. continues to present with increased tone, and tightness through his LUE, and hand. Pt. requires verbal cues, and visual demonstration, and consistent verbal cues for slow gentle stretching the LUE, and hand.    Occupational Profile and client history currently impacting functional performance  lasting effects from past CVA, contracture of left hand, patient with no current home program, noncompliance with splint wear, hand hygiene with long nails and tends to hold  hand in flexed position.     Occupational performance deficits (Please refer to evaluation for details):  ADL's;IADL's;Rest and Sleep;Leisure    Rehab Potential  Fair    Current Impairments/barriers affecting progress:  Decreased ROM of left hand both active and passive, decreased functional use of left UE, decreased compliance with splint wear and care    OT Frequency  2x / week    OT Duration  8 weeks    OT Treatment/Interventions  Self-care/ADL training;Moist Heat;DME and/or AE instruction;Splinting;Balance training;Therapeutic activities;Therapeutic exercise;Cognitive remediation/compensation;Neuromuscular education;Passive range of motion;Manual Therapy;Patient/family education    Clinical Decision Making  Several treatment options, min-mod task modification necessary    Consulted and Agree with Plan of Care  Patient;Family member/caregiver    Family Member Consulted  wife       Patient will benefit from skilled therapeutic intervention in order to improve the following deficits and impairments:  Decreased cognition, Decreased knowledge of use of DME, Decreased skin integrity, Impaired flexibility, Pain, Decreased coordination, Decreased activity tolerance, Decreased endurance, Decreased range of motion, Decreased strength, Decreased balance, Difficulty walking, Impaired UE functional use  Visit Diagnosis: Muscle weakness (generalized)  Other lack of coordination    Problem List Patient Active Problem List   Diagnosis Date Noted  . Stroke ([place]) 10/27/2014  . Lymphadenitis 04/09/2011  . HTN (hypertension) 04/09/2011  . DM (diabetes mellitus) ([place]) 04/09/2011    Harrel Carina, MS, OTR/L 07/24/2017, 3:54 PM  [place] MAIN [facility] SERVICES [address] [place], [place], [address] Phone: [phone]   Fax:  [phone]  Name: Daniel [name] MRN: [id number] Date of Birth: Nov 16, 1955

## 2017-07-24 NOTE — Progress Notes (Signed)
07/24/2017 5:03 PM   Daniel [name] 1955-10-14 [id number]  Referring provider: Marden Noble, MD [address] [place], [place] [address]  Chief Complaint  Patient presents with  . Elevated PSA    HPI: 62 yo M referred for further evaluation of elevated PSA.    He recently established care with a new PCP, Dr. Ouida Sills after his previous primary care, Dr. Brynda Greathouse retired.  As part of his routine labs, his PSA was noted to be elevated to 4.93 on 07/18/2017 from a unknown baseline.  He believes his previous physician did perform PSA screening and rectal exams but he cannot recall ever being told in the past was elevated.  We have no previous labs for comparison.  He was also noted to have a rise in his Cr to 1.6 on 07/18/17 from 1.3 on 10/02/17.  It is been elevated as high as 1.66 in the past.  Renal ultrasound was ordered and is pending.    He does have multiple voiding complaints today.  He primarily complains of weak stream, sensation of incomplete emptying, difficulty starting his stream.  IPSS as below.  PVR 2 cc.    He has multiple medical problems including history of diabetes, stroke with left hemiparesis, seizure disorder amongst others.  IPSS    Row Name 07/24/17 1300         International Prostate Symptom Score   How often have you had the sensation of not emptying your bladder?  Less than half the time     How often have you had to urinate less than every two hours?  Less than half the time     How often have you found you stopped and started again several times when you urinated?  About half the time     How often have you found it difficult to postpone urination?  Not at All     How often have you had a weak urinary stream?  Not at All     How often have you had to strain to start urination?  Less than 1 in 5 times     How many times did you typically get up at night to urinate?  1 Time     Total IPSS Score  9       Quality of Life due to urinary symptoms   If you  were to spend the rest of your life with your urinary condition just the way it is now how would you feel about that?  Mixed        Score:  1-7 Mild 8-19 Moderate 20-35 Severe  PMH: Past Medical History:  Diagnosis Date  . Diabetes mellitus without complication ([place])   . Hypertension   . Pancreatitis   . Stroke [facility])     Surgical History: Past Surgical History:  Procedure Laterality Date  . TEE WITHOUT CARDIOVERSION N/A 11/01/2014   Procedure: TRANSESOPHAGEAL ECHOCARDIOGRAM (TEE);  Surgeon: Wellington Hampshire, MD;  Location: [facility] ORS;  Service: Cardiovascular;  Laterality: N/A;    Home Medications:  Allergies as of 07/24/2017   No Known Allergies     Medication List        Accurate as of 07/24/17  5:03 PM. Always use your most recent med list.          amLODipine 10 MG tablet Commonly known as:  NORVASC Take 10 mg by mouth daily.   aspirin 325 MG tablet Take 1 tablet (325 mg total) by mouth daily.  DULoxetine 60 MG capsule Commonly known as:  CYMBALTA   gabapentin 300 MG capsule Commonly known as:  NEURONTIN Take 1 capsule (300 mg total) by mouth 3 (three) times daily.   gemfibrozil 600 MG tablet Commonly known as:  LOPID Take 600 mg by mouth 2 (two) times daily before a meal.   lamoTRIgine 100 MG tablet Commonly known as:  LAMICTAL   lisinopril-hydrochlorothiazide 20-25 MG tablet Commonly known as:  PRINZIDE,ZESTORETIC   meloxicam 15 MG tablet Commonly known as:  MOBIC Take 15 mg by mouth daily.   rosuvastatin 10 MG tablet Commonly known as:  CRESTOR Take 1 tablet (10 mg total) by mouth daily at 6 PM.   tamsulosin 0.4 MG Caps capsule Commonly known as:  FLOMAX Take 1 capsule (0.4 mg total) by mouth daily.       Allergies: No Known Allergies  Family History: Family History  Problem Relation Age of Onset  . CAD Sister   . Hypertension Sister   . Prostate cancer Father   . Bladder Cancer Neg Hx   . Kidney cancer Neg Hx     Social  History:  reports that he quit smoking about 2 years ago. he has never used smokeless tobacco. He reports that he drinks alcohol. He reports that he does not use drugs.  ROS: UROLOGY Frequent Urination?: No Hard to postpone urination?: No Burning/pain with urination?: No Get up at night to urinate?: Yes Leakage of urine?: Yes Urine stream starts and stops?: No Trouble starting stream?: No Do you have to strain to urinate?: No Blood in urine?: No Urinary tract infection?: No Sexually transmitted disease?: No Injury to kidneys or bladder?: No Painful intercourse?: No Weak stream?: No Erection problems?: No Penile pain?: No  Gastrointestinal Nausea?: No Vomiting?: No Indigestion/heartburn?: Yes Diarrhea?: No Constipation?: No  Constitutional Fever: No Night sweats?: No Weight loss?: No Fatigue?: No  Skin Skin rash/lesions?: No Itching?: No  Eyes Blurred vision?: No Double vision?: No  Ears/Nose/Throat Sore throat?: No Sinus problems?: No  Hematologic/Lymphatic Swollen glands?: No Easy bruising?: No  Cardiovascular Leg swelling?: No Chest pain?: No  Respiratory Cough?: No Shortness of breath?: No  Endocrine Excessive thirst?: No  Musculoskeletal Back pain?: No Joint pain?: No  Neurological Headaches?: No Dizziness?: No  Psychologic Depression?: No Anxiety?: No  Physical Exam: BP 98/63 (BP Location: Right Arm, Patient Position: Sitting, Cuff Size: Large)   Pulse 72   Ht 5\' 8"  (1.727 m)   Wt 211 lb 1.6 oz (95.8 kg)   BMI 32.10 kg/m   Constitutional:  Alert and oriented, No acute distress.  Accompanied by wife today. HEENT: [place] AT, moist mucus membranes.  Trachea midline, no masses. Cardiovascular: No clubbing, cyanosis, or edema. Respiratory: Normal respiratory effort, no increased work of breathing. GI: Abdomen is soft, nontender, nondistended, no abdominal masses Rectal: Normal sphincter tone.  Rectal exam somewhat limited due to patient  habitus and mobility.  Apex of prostate normal without nodules, unable to palpate base or estimate size. Skin: No rashes, bruises or suspicious lesions. Neurologic: Left upper extremity hemiparesis appreciated.  Ambulating with cane. Psychiatric: Normal mood and affect.  Laboratory Data: PSA as above  Additional labs from 07/18/2017 reviewed Creatinine 1.6 Hemoglobin A1c 6.6 Hemoglobin 13.1  Urinalysis   07/22/17 Component Name 07/22/2017   Yellow  Clear  1.025  5.5  Negative  Negative  Negative  Negative  Negative  Negative  None Seen  None Seen  None Seen  None Seen   Pertinent  Imaging: Results for orders placed or performed in visit on 07/24/17  Bladder Scan (Post Void Residual) in office  Result Value Ref Range   Scan Result 2     Assessment & Plan:    1. Elevated PSA  We reviewed the implications of an elevated PSA and the uncertainty surrounding it. In general, a man's PSA increases with age and is produced by both normal and cancerous prostate tissue. Differential for elevated PSA is BPH, prostate cancer, infection, recent intercourse/ejaculation, prostate infarction, recent urethroscopic manipulation (foley placement/cystoscopy) and prostatitis. Management of an elevated PSA can include observation or prostate biopsy and wediscussed this in detail. We discussed that indications for prostate biopsy are defined by age and race specific PSA cutoffs as well as a PSA velocity of 0.75/year.  Given that we have no previous PSAs for comparison, I would like to get his PSA in about 3 months.  Rectal exam somewhat limited today due to habitus and positioning.  Given his multiple medical comorbidities, I am hesitant to consider proceeding with any prostate biopsy unless his PSA continues to rise.  All questions answered today - Bladder Scan (Post Void Residual) in office  2. BPH with urinary obstruction Obstructive voiding symptoms concerning for underlying BPH We  will start Flomax 0.4 mg and reassess next visit   Return in about 3 months (around 10/21/2017) for PSA/ IPSS.  Hollice Espy, MD  [facility] [address], [place], [place] [address] [phone]

## 2017-07-29 ENCOUNTER — Ambulatory Visit: Payer: Medicare HMO | Admitting: Occupational Therapy

## 2017-07-31 ENCOUNTER — Encounter: Payer: Self-pay | Admitting: Occupational Therapy

## 2017-07-31 ENCOUNTER — Ambulatory Visit: Payer: Medicare HMO | Attending: Neurology | Admitting: Occupational Therapy

## 2017-07-31 DIAGNOSIS — M6281 Muscle weakness (generalized): Secondary | ICD-10-CM | POA: Diagnosis not present

## 2017-07-31 DIAGNOSIS — R278 Other lack of coordination: Secondary | ICD-10-CM | POA: Diagnosis not present

## 2017-07-31 NOTE — Therapy (Signed)
[place] MAIN [facility] SERVICES [address] [place], [place], [address] Phone: [phone]   Fax:  [phone]  Occupational Therapy Treatment  Patient Details  Name: Daniel [name] MRN: [id number] Date of Birth: 1956/01/22 Referring Provider: Nicky Pugh   Encounter Date: 07/31/2017  OT End of Session - 07/31/17 1649    Visit Number  10    Number of Visits  16    Date for OT Re-Evaluation  08/09/17    OT Start Time  1145    OT Stop Time  1230    OT Time Calculation (min)  45 min    Activity Tolerance  Patient tolerated treatment well    Behavior During Therapy  [facility] for tasks assessed/performed       Past Medical History:  Diagnosis Date  . Diabetes mellitus without complication ([place])   . Hypertension   . Pancreatitis   . Stroke [facility])     Past Surgical History:  Procedure Laterality Date  . TEE WITHOUT CARDIOVERSION N/A 11/01/2014   Procedure: TRANSESOPHAGEAL ECHOCARDIOGRAM (TEE);  Surgeon: Wellington Hampshire, MD;  Location: [facility] ORS;  Service: Cardiovascular;  Laterality: N/A;    There were no vitals filed for this visit.  Subjective Assessment - 07/31/17 1648    Subjective   Pt. reports watches a lot of T.V. at home.    Pertinent History  62 year old male who suffered a CVA on October 28, 2014.    Limitations  L arm use.    Currently in Pain?  No/denies       OT TREATMENT    Pt. Education was provided about the importance of keeping nails clipped, and thoroughly washing hs hand to promote, and maintan good skin integrity through the palmar aspect of his left hand.  Therapeutic Exercise:  Pt. Tolerated PROM in all joint ranges of the LUE, and hand. Pt. Responded well to slow gentle stretching, with an inhibitive affect on the tone. Pt., and wife were educated about self-ROM techniques. Pt. Tolerated moist heat modality to the Left shoulder during ROM.                     OT Education - 07/31/17 1648    Education provided  Yes    Education Details  LUE ROM    Person(s) Educated  Patient;Spouse    Methods  Verbal cues    Comprehension  Returned demonstration          OT Long Term Goals - 06/14/17 1518      OT LONG TERM GOAL #1   Title  Patient will demonstrate ability to perform self directed ROM of LUE to prepare hand for splint use.     Baseline  patient currently not wearing splint, contractures forming in left hand    Time  8    Period  Weeks    Status  New    Target Date  08/09/17      OT LONG TERM GOAL #2   Title  Patient will comply with splint wear, care and schedule with assist to don and doff splint.     Baseline  unable to don splint.     Time  8    Period  Weeks    Status  New    Target Date  08/09/17      OT LONG TERM GOAL #3   Title  Patient will demonstrate understanding of hand hygiene and perform daily with cues.  Baseline  poor hand hygiene at eval     Time  8    Period  Weeks    Status  New    Target Date  08/09/17      OT LONG TERM GOAL #4   Title  Patient will demonstrate UB dressing with modified independence.     Baseline  moderate assist at eval    Time  6    Period  Weeks    Status  New    Target Date  07/25/17            Plan - 07/31/17 1649    Clinical Impression Statement  Pt. continues to present with increased tone, and tightness. Pt. reports having Left shoulder, and rib soreness, and responded well to heat for the left shoulder. Pt. continues to work on improving tone thorughout the LUE, and hand in order to prepare for engagement in functional tasks.    Occupational Profile and client history currently impacting functional performance  lasting effects from past CVA, contracture of left hand, patient with no current home program, noncompliance with splint wear, hand hygiene with long nails and tends to hold hand in flexed position.     Occupational performance deficits (Please refer to evaluation for details):   ADL's;IADL's;Rest and Sleep;Leisure    Rehab Potential  Fair    Current Impairments/barriers affecting progress:  Decreased ROM of left hand both active and passive, decreased functional use of left UE, decreased compliance with splint wear and care    OT Frequency  2x / week    OT Duration  8 weeks    OT Treatment/Interventions  Self-care/ADL training;Moist Heat;DME and/or AE instruction;Splinting;Balance training;Therapeutic activities;Therapeutic exercise;Cognitive remediation/compensation;Neuromuscular education;Passive range of motion;Manual Therapy;Patient/family education    Clinical Decision Making  Several treatment options, min-mod task modification necessary    Consulted and Agree with Plan of Care  Patient;Family member/caregiver       Patient will benefit from skilled therapeutic intervention in order to improve the following deficits and impairments:  Decreased cognition, Decreased knowledge of use of DME, Decreased skin integrity, Impaired flexibility, Pain, Decreased coordination, Decreased activity tolerance, Decreased endurance, Decreased range of motion, Decreased strength, Decreased balance, Difficulty walking, Impaired UE functional use  Visit Diagnosis: Muscle weakness (generalized)  Other lack of coordination    Problem List Patient Active Problem List   Diagnosis Date Noted  . Stroke ([place]) 10/27/2014  . Lymphadenitis 04/09/2011  . HTN (hypertension) 04/09/2011  . DM (diabetes mellitus) ([place]) 04/09/2011    Harrel Carina 07/31/2017, 4:56 PM  [place] MAIN [facility] SERVICES [address] [place], [place], [address] Phone: [phone]   Fax:  [phone]  Name: Daniel [name] MRN: [id number] Date of Birth: 1955/09/18

## 2017-08-05 ENCOUNTER — Ambulatory Visit: Payer: Medicare HMO | Admitting: Occupational Therapy

## 2017-08-05 ENCOUNTER — Encounter: Payer: Self-pay | Admitting: Occupational Therapy

## 2017-08-05 ENCOUNTER — Other Ambulatory Visit: Payer: Self-pay

## 2017-08-05 DIAGNOSIS — M6281 Muscle weakness (generalized): Secondary | ICD-10-CM

## 2017-08-05 DIAGNOSIS — R278 Other lack of coordination: Secondary | ICD-10-CM | POA: Diagnosis not present

## 2017-08-05 NOTE — Therapy (Signed)
[place] MAIN [facility] SERVICES [place], [place], [address] Phone: [phone]   Fax:  [phone]  Occupational Therapy Treatment  Patient Details  Name: Daniel [name] MRN: [id number] Date of Birth: February 21, 1956 Referring Provider: Nicky Pugh   Encounter Date: 08/05/2017    Past Medical History:  Diagnosis Date  . Diabetes mellitus without complication ([place])   . Hypertension   . Pancreatitis   . Stroke [facility])     Past Surgical History:  Procedure Laterality Date  . TEE WITHOUT CARDIOVERSION N/A 11/01/2014   Procedure: TRANSESOPHAGEAL ECHOCARDIOGRAM (TEE);  Surgeon: Wellington Hampshire, MD;  Location: [facility] ORS;  Service: Cardiovascular;  Laterality: N/A;    There were no vitals filed for this visit.    OT TREATMENT    Pt. Education was provided about the importance of keeping nails clipped, and thoroughly washing his hand with soap and water, trying to promote, and maintan good skin integrity through the palmar aspect of his left hand.  Therapeutic Exercise:  Pt. Tolerated PROM in all joint ranges of the LUE, and hand. Pt. Responded well to slow gentle stretching, with an inhibitive affect on the tone. Pt., and wife were educated about self-ROM techniques.  Manual Therapy:  Pt. Tolerated soft tissue, and joint mobilizations for radius on ulnar for forearm supination, and metacarpal spread stretches to prepare the LUE for ROM.                             OT Long Term Goals - 06/14/17 1518      OT LONG TERM GOAL #1   Title  Patient will demonstrate ability to perform self directed ROM of LUE to prepare hand for splint use.     Baseline  patient currently not wearing splint, contractures forming in left hand    Time  8    Period  Weeks    Status  New    Target Date  08/09/17      OT LONG TERM GOAL #2   Title  Patient will comply with splint wear, care and schedule with assist to don and  doff splint.     Baseline  unable to don splint.     Time  8    Period  Weeks    Status  New    Target Date  08/09/17      OT LONG TERM GOAL #3   Title  Patient will demonstrate understanding of hand hygiene and perform daily with cues.     Baseline  poor hand hygiene at eval     Time  8    Period  Weeks    Status  New    Target Date  08/09/17      OT LONG TERM GOAL #4   Title  Patient will demonstrate UB dressing with modified independence.     Baseline  moderate assist at eval    Time  6    Period  Weeks    Status  New    Target Date  07/25/17              Patient will benefit from skilled therapeutic intervention in order to improve the following deficits and impairments:     Visit Diagnosis: No diagnosis found.    Problem List Patient Active Problem List   Diagnosis Date Noted  . Stroke ([place]) 10/27/2014  . Lymphadenitis 04/09/2011  . HTN (hypertension) 04/09/2011  .  DM (diabetes mellitus) ([place]) 04/09/2011    Harrel Carina, MS, OTR/L 08/05/2017, 3:15 PM  [place] MAIN [facility] SERVICES [address] [place], [place], [address] Phone: [phone]   Fax:  [phone]  Name: [name] MRN: [id number] Date of Birth: December 18, 1955

## 2017-08-06 NOTE — Therapy (Signed)
[place] MAIN [facility] SERVICES [address] [place], [place], [address] Phone: [phone]   Fax:  [phone]  Occupational Therapy Treatment  Patient Details  Name: Daniel [name] MRN: [id number] Date of Birth: Apr 06, 1956 Referring Provider: Nicky Pugh   Encounter Date: 07/24/2017  OT End of Session - 08/05/17 1600    Visit Number  11    Number of Visits  16    Date for OT Re-Evaluation  08/09/17    OT Start Time  1304    OT Stop Time  1345    OT Time Calculation (min)  41 min    Activity Tolerance  Patient tolerated treatment well    Behavior During Therapy  [facility] for tasks assessed/performed       Past Medical History:  Diagnosis Date  . Diabetes mellitus without complication ([place])   . Hypertension   . Pancreatitis   . Stroke [facility])     Past Surgical History:  Procedure Laterality Date  . TEE WITHOUT CARDIOVERSION N/A 11/01/2014   Procedure: TRANSESOPHAGEAL ECHOCARDIOGRAM (TEE);  Surgeon: Wellington Hampshire, MD;  Location: [facility] ORS;  Service: Cardiovascular;  Laterality: N/A;    There were no vitals filed for this visit.  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    Pertinent History  62 year old male who suffered a CVA on October 28, 2014.    Limitations  L arm use.    Patient Stated Goals  Patient wants to be able to open and close his left hand, use it for daily tasks. Decrease pain in shoulder.     Currently in Pain?  No/denies       OT TREATMENT    Pt. Education was provided about the importance of keeping nails clipped, and thoroughly washing hs hand to promote, and maintan good skin integrity through the palmar aspect of his left hand.  Therapeutic Exercise:  Pt. Tolerated PROM in all joint ranges of the LUE, and hand. Pt. Responded well to slow gentle stretching, with an inhibitive affect on the tone. Pt., and  wife were educated about self-ROM techniques.  Manual Therapy:  Pt. Tolerated soft tissue, and joint mobilizations for radius on ulnar for forearm supination, and metacarpal spread stretches to prepare the LUE for ROM.                         OT Education - 08/05/17 1600    Education provided  Yes    Education Details  LUE ROM    Person(s) Educated  Patient;Spouse    Methods  Verbal cues    Comprehension  Returned demonstration          OT Long Term Goals - 06/14/17 1518      OT LONG TERM GOAL #1   Title  Patient will demonstrate ability to perform self directed ROM of LUE to prepare hand for splint use.     Baseline  patient currently not wearing splint, contractures forming in left hand    Time  8    Period  Weeks    Status  New    Target Date  08/09/17      OT LONG TERM GOAL #2   Title  Patient will comply with splint wear, care and schedule with assist to don and doff splint.  Baseline  unable to don splint.     Time  8    Period  Weeks    Status  New    Target Date  08/09/17      OT LONG TERM GOAL #3   Title  Patient will demonstrate understanding of hand hygiene and perform daily with cues.     Baseline  poor hand hygiene at eval     Time  8    Period  Weeks    Status  New    Target Date  08/09/17      OT LONG TERM GOAL #4   Title  Patient will demonstrate UB dressing with modified independence.     Baseline  moderate assist at eval    Time  6    Period  Weeks    Status  New    Target Date  07/25/17            Plan - 08/05/17 1601    Clinical Impression Statement  Pt. continues to present with increased tone, and tightness through the LUE, and hand. Pt. continues to work onnormalizing tone thorughout the LUE, and hand in order to prepare for engagement in functional tasks.    Occupational Profile and client history currently impacting functional performance  lasting effects from past CVA, contracture of left hand,  patient with no current home program, noncompliance with splint wear, hand hygiene with long nails and tends to hold hand in flexed position.     Occupational performance deficits (Please refer to evaluation for details):  ADL's;IADL's;Rest and Sleep;Leisure    Rehab Potential  Fair    Current Impairments/barriers affecting progress:  Decreased ROM of left hand both active and passive, decreased functional use of left UE, decreased compliance with splint wear and care    OT Frequency  2x / week    OT Duration  8 weeks    OT Treatment/Interventions  Self-care/ADL training;Moist Heat;DME and/or AE instruction;Splinting;Balance training;Therapeutic activities;Therapeutic exercise;Cognitive remediation/compensation;Neuromuscular education;Passive range of motion;Manual Therapy;Patient/family education    Clinical Decision Making  Several treatment options, min-mod task modification necessary    Consulted and Agree with Plan of Care  Patient;Family member/caregiver    Family Member Consulted  wife       Patient will benefit from skilled therapeutic intervention in order to improve the following deficits and impairments:  Decreased cognition, Decreased knowledge of use of DME, Decreased skin integrity, Impaired flexibility, Pain, Decreased coordination, Decreased activity tolerance, Decreased endurance, Decreased range of motion, Decreased strength, Decreased balance, Difficulty walking, Impaired UE functional use  Visit Diagnosis: Muscle weakness (generalized)  Other lack of coordination    Problem List Patient Active Problem List   Diagnosis Date Noted  . Stroke ([place]) 10/27/2014  . Lymphadenitis 04/09/2011  . HTN (hypertension) 04/09/2011  . DM (diabetes mellitus) ([place]) 04/09/2011    Harrel Carina, MS, OTR/L 08/06/2017, 10:57 AM  [place] MAIN [facility] SERVICES [address] [place], [place], [address] Phone: [phone]   Fax:   [phone]  Name: Daniel [name] MRN: [id number] Date of Birth: [birth year]

## 2017-08-07 ENCOUNTER — Ambulatory Visit: Payer: Medicare HMO | Admitting: Occupational Therapy

## 2017-08-07 DIAGNOSIS — M6281 Muscle weakness (generalized): Secondary | ICD-10-CM

## 2017-08-07 DIAGNOSIS — R278 Other lack of coordination: Secondary | ICD-10-CM | POA: Diagnosis not present

## 2017-08-08 ENCOUNTER — Encounter: Payer: Self-pay | Admitting: Occupational Therapy

## 2017-08-08 NOTE — Therapy (Signed)
[place] MAIN [facility] SERVICES [address] [place], [place], [address] Phone: [phone]   Fax:  [phone]  Occupational Therapy Treatment  Patient Details  Name: Daniel [name] MRN: [id number] Date of Birth: 04-20-1956 Referring Provider: Nicky Pugh   Encounter Date: 08/07/2017  OT End of Session - 08/08/17 0906    Visit Number  12    Number of Visits  16    Date for OT Re-Evaluation  08/09/17    OT Start Time  2831    OT Stop Time  1345    OT Time Calculation (min)  38 min    Activity Tolerance  Patient tolerated treatment well    Behavior During Therapy  [facility] for tasks assessed/performed       Past Medical History:  Diagnosis Date  . Diabetes mellitus without complication ([place])   . Hypertension   . Pancreatitis   . Stroke [facility])     Past Surgical History:  Procedure Laterality Date  . TEE WITHOUT CARDIOVERSION N/A 11/01/2014   Procedure: TRANSESOPHAGEAL ECHOCARDIOGRAM (TEE);  Surgeon: Wellington Hampshire, MD;  Location: [facility] ORS;  Service: Cardiovascular;  Laterality: N/A;    There were no vitals filed for this visit.  Subjective Assessment - 08/08/17 0904    Subjective   Pt. reports being irritated with his wife today.    Pertinent History  62 year old male who suffered a CVA on October 28, 2014.    Limitations  L arm use.    Patient Stated Goals  Patient wants to be able to open and close his left hand, use it for daily tasks. Decrease pain in shoulder.     Currently in Pain?  Yes    Pain Score  4     Pain Location  Shoulder    Pain Orientation  Left    Pain Descriptors / Indicators  Aching    Pain Type  Chronic pain    Pain Onset  More than a month ago    Pain Frequency  Intermittent         OT TREATMENT  Pt. Education was provided about the importance of keeping nails clipped, and thoroughly washing his hand with soap and water, trying to promote, and maintan good skin integrity through the palmar aspect of his  left hand.  Therapeutic Exercise:  Pt. Tolerated PROM in all joint ranges of the LUE, and hand. Pt. Responded well to slow gentle stretching, with an inhibitive affect on the tone. Pt., and wife were educated about self-ROMtechniques.  Manual Therapy:  Pt. Tolerated soft tissue, and joint mobilizations for radius on ulnar forforearmsupination, andmetacarpal spread stretches to prepare the LUE for ROM.                             OT Education - 08/08/17 0906    Education provided  Yes    Education Details  LUE ROM    Person(s) Educated  Patient;Spouse    Methods  Verbal cues    Comprehension  Returned demonstration          OT Long Term Goals - 06/14/17 1518      OT LONG TERM GOAL #1   Title  Patient will demonstrate ability to perform self directed ROM of LUE to prepare hand for splint use.     Baseline  patient currently not wearing splint, contractures forming in left hand    Time  8  Period  Weeks    Status  New    Target Date  08/09/17      OT LONG TERM GOAL #2   Title  Patient will comply with splint wear, care and schedule with assist to don and doff splint.     Baseline  unable to don splint.     Time  8    Period  Weeks    Status  New    Target Date  08/09/17      OT LONG TERM GOAL #3   Title  Patient will demonstrate understanding of hand hygiene and perform daily with cues.     Baseline  poor hand hygiene at eval     Time  8    Period  Weeks    Status  New    Target Date  08/09/17      OT LONG TERM GOAL #4   Title  Patient will demonstrate UB dressing with modified independence.     Baseline  moderate assist at eval    Time  6    Period  Weeks    Status  New    Target Date  07/25/17            Plan - 08/08/17 0907    Clinical Impression Statement  Pt. was late for the session today as he went to buy a bag of chips. Pt. continues to present with increased tone, and tightness throughout his LUE, and hand  which hinders his ability to engage it in functional ADL, and IADL tasks. Pt. was distracted today, reports being aggravated with his wife.     Occupational Profile and client history currently impacting functional performance  lasting effects from past CVA, contracture of left hand, patient with no current home program, noncompliance with splint wear, hand hygiene with long nails and tends to hold hand in flexed position.     Occupational performance deficits (Please refer to evaluation for details):  ADL's;IADL's;Rest and Sleep;Leisure    Rehab Potential  Fair    Current Impairments/barriers affecting progress:  Decreased ROM of left hand both active and passive, decreased functional use of left UE, decreased compliance with splint wear and care    OT Frequency  2x / week    OT Duration  8 weeks    OT Treatment/Interventions  Self-care/ADL training;Moist Heat;DME and/or AE instruction;Splinting;Balance training;Therapeutic activities;Therapeutic exercise;Cognitive remediation/compensation;Neuromuscular education;Passive range of motion;Manual Therapy;Patient/family education    Clinical Decision Making  Several treatment options, min-mod task modification necessary    Consulted and Agree with Plan of Care  Patient;Family member/caregiver    Family Member Consulted  wife       Patient will benefit from skilled therapeutic intervention in order to improve the following deficits and impairments:  Decreased cognition, Decreased knowledge of use of DME, Decreased skin integrity, Impaired flexibility, Pain, Decreased coordination, Decreased activity tolerance, Decreased endurance, Decreased range of motion, Decreased strength, Decreased balance, Difficulty walking, Impaired UE functional use  Visit Diagnosis: Muscle weakness (generalized)    Problem List Patient Active Problem List   Diagnosis Date Noted  . Stroke ([place]) 10/27/2014  . Lymphadenitis 04/09/2011  . HTN (hypertension) 04/09/2011  .  DM (diabetes mellitus) ([place]) 04/09/2011    Harrel Carina, MS, OTR/L 08/08/2017, 10:48 AM  [place] MAIN [facility] SERVICES [address] [place], [place], [address] Phone: [phone]   Fax:  [phone]  Name: Daniel [name] MRN: [id number] Date of Birth: Aug 02, 1955

## 2017-08-12 ENCOUNTER — Encounter: Payer: Self-pay | Admitting: Occupational Therapy

## 2017-08-12 ENCOUNTER — Ambulatory Visit: Payer: Medicare HMO | Admitting: Occupational Therapy

## 2017-08-12 DIAGNOSIS — M6281 Muscle weakness (generalized): Secondary | ICD-10-CM | POA: Diagnosis not present

## 2017-08-12 DIAGNOSIS — R278 Other lack of coordination: Secondary | ICD-10-CM | POA: Diagnosis not present

## 2017-08-12 NOTE — Therapy (Addendum)
[place] MAIN [facility] SERVICES [address] [place], [place], [address] Phone: [phone]   Fax:  [phone]  Occupational Therapy Treatment/Recertification Note  Patient Details  Name: Daniel [name] MRN: [id number] Date of Birth: [birth year] Referring Provider: Nicky Pugh   Encounter Date: 08/12/2017  OT End of Session - 08/12/17 1304    Visit Number  13    Number of Visits  16    Date for OT Re-Evaluation  10/07/17    OT Start Time  2423    OT Stop Time  1315    OT Time Calculation (min)  40 min    Activity Tolerance  Patient tolerated treatment well    Behavior During Therapy  [facility] for tasks assessed/performed       Past Medical History:  Diagnosis Date  . Diabetes mellitus without complication ([place])   . Hypertension   . Pancreatitis   . Stroke [facility])     Past Surgical History:  Procedure Laterality Date  . TEE WITHOUT CARDIOVERSION N/A 11/01/2014   Procedure: TRANSESOPHAGEAL ECHOCARDIOGRAM (TEE);  Surgeon: Wellington Hampshire, MD;  Location: [facility] ORS;  Service: Cardiovascular;  Laterality: N/A;    There were no vitals filed for this visit.  Subjective Assessment - 08/12/17 1745    Subjective   Pt. reports his neighbor was upset with him this morning for mowing the lawn early in the morning.    Pertinent History  62 year old male who suffered a CVA on October 28, 2014.    Limitations  L arm use.    Patient Stated Goals  Patient wants to be able to open and close his left hand, use it for daily tasks. Decrease pain in shoulder.     Currently in Pain?  Yes    Pain Score  4     Pain Location  Shoulder    Pain Descriptors / Indicators  Aching    Pain Type  Acute pain    Pain Onset  More than a month ago    Multiple Pain Sites  No       OT TREATMENT    Pt. goals were reviewed and modified with pt.   There. Ex.:  Pt. tolerated PROM in all joint ranges of motion for the RUE and  hand.                       OT Education - 08/12/17 1747    Education provided  Yes    Education Details  LUE ROM    Person(s) Educated  Patient;Spouse    Methods  Verbal cues    Comprehension  Returned demonstration          OT Long Term Goals - 08/12/17 1801      OT LONG TERM GOAL #1   Title  Patient will demonstrate ability to perform self directed ROM of LUE to prepare hand for splint use.     Baseline  patient currently not wearing splint, contractures forming in left hand 08/12/2016: Pt. intermittently wears splint.    Time  8    Period  Weeks    Status  On-going    Target Date  10/07/17      OT LONG TERM GOAL #2   Title  Patient will comply with splint wear, care and schedule with assist to don and doff splint.     Baseline  unable to don splint. 08/12/2017: Pt. requires assist    Time  8    Period  Weeks    Status  On-going    Target Date  10/07/17      OT LONG TERM GOAL #3   Title  Patient will demonstrate understanding of hand hygiene and perform daily with cues.     Baseline  poor hand hygiene at eval     Time  8    Period  Weeks    Status  On-going    Target Date  10/07/17      OT LONG TERM GOAL #4   Title  Patient will demonstrate UB dressing with modified independence.     Baseline  moderate assist at eval, 08/12/2017: Pt. able to donn pullover shirt     Time  6    Period  Weeks    Status  Achieved    Target Date  10/07/17      OT LONG TERM GOAL #5   Title  Pt. will be able to button a shirt with modified independence    Baseline  08/12/2017: Pt. is unable to perform    Time  12    Period  Weeks    Status  New    Target Date  10/07/17            Plan - 08/12/17 1306    Clinical Impression Statement  Pt. reports he gets paranoid sometimes, since having the stroke. Pt. reports he wished more people understood about the paranoia. Pt. and wife were given information about the Stroke support group. Pt. continues to present with  increased tone, and tightness through his LUE, and hand.  Pt. is now able to donn a shirt, however has difficulty  managing buttons. Pt. is able to perform light meal prep.     Occupational performance deficits (Please refer to evaluation for details):  ADL's;IADL's;Rest and Sleep;Leisure    Rehab Potential  Fair    Current Impairments/barriers affecting progress:  Decreased ROM of left hand both active and passive, decreased functional use of left UE, decreased compliance with splint wear and care    OT Frequency  2x / week    OT Duration  8 weeks    OT Treatment/Interventions  Self-care/ADL training;Moist Heat;DME and/or AE instruction;Splinting;Balance training;Therapeutic activities;Therapeutic exercise;Cognitive remediation/compensation;Neuromuscular education;Passive range of motion;Manual Therapy;Patient/family education    Clinical Decision Making  Several treatment options, min-mod task modification necessary    Consulted and Agree with Plan of Care  Patient;Family member/caregiver       Patient will benefit from skilled therapeutic intervention in order to improve the following deficits and impairments:  Decreased cognition, Decreased knowledge of use of DME, Decreased skin integrity, Impaired flexibility, Pain, Decreased coordination, Decreased activity tolerance, Decreased endurance, Decreased range of motion, Decreased strength, Decreased balance, Difficulty walking, Impaired UE functional use  Visit Diagnosis: Muscle weakness (generalized)    Problem List Patient Active Problem List   Diagnosis Date Noted  . Stroke ([place]) 10/27/2014  . Lymphadenitis 04/09/2011  . HTN (hypertension) 04/09/2011  . DM (diabetes mellitus) ([place]) 04/09/2011    Harrel Carina, MS, OTR/L 08/12/2017, 6:08 PM  [place] MAIN [facility] SERVICES [address] [place], [place], [address] Phone: [phone]   Fax:  [phone]  Name: Daniel [name] MRN:  [id number] Date of Birth: 10/09/1955

## 2017-08-13 NOTE — Addendum Note (Signed)
Addended by: Lucia Bitter on: 08/13/2017 08:27 AM   Modules accepted: Orders

## 2017-08-13 NOTE — Addendum Note (Signed)
Addended by: Lucia Bitter on: 08/13/2017 10:49 AM   Modules accepted: Orders

## 2017-08-14 ENCOUNTER — Ambulatory Visit: Payer: Medicare HMO | Admitting: Occupational Therapy

## 2017-08-19 ENCOUNTER — Other Ambulatory Visit: Payer: Self-pay

## 2017-08-19 ENCOUNTER — Ambulatory Visit: Payer: Medicare HMO | Admitting: Occupational Therapy

## 2017-08-19 ENCOUNTER — Encounter: Payer: Self-pay | Admitting: Occupational Therapy

## 2017-08-19 DIAGNOSIS — M6281 Muscle weakness (generalized): Secondary | ICD-10-CM | POA: Diagnosis not present

## 2017-08-19 DIAGNOSIS — R278 Other lack of coordination: Secondary | ICD-10-CM | POA: Diagnosis not present

## 2017-08-19 NOTE — Therapy (Addendum)
[place] MAIN [facility] SERVICES [address] [place], [place], [address] Phone: [phone]   Fax:  [phone]  Occupational Therapy Treatment  Patient Details  Name: Daniel [name] MRN: [id number] Date of Birth: [birth year] Referring Provider: Nicky Pugh   Encounter Date: 08/19/2017  OT End of Session - 08/19/17 1309    Visit Number  14    Number of Visits  16    Date for OT Re-Evaluation  10/07/17    OT Start Time  1300    OT Stop Time  1345    OT Time Calculation (min)  45 min    Activity Tolerance  Patient tolerated treatment well    Behavior During Therapy  [facility] for tasks assessed/performed       Past Medical History:  Diagnosis Date  . Diabetes mellitus without complication ([place])   . Hypertension   . Pancreatitis   . Stroke [facility])     Past Surgical History:  Procedure Laterality Date  . TEE WITHOUT CARDIOVERSION N/A 11/01/2014   Procedure: TRANSESOPHAGEAL ECHOCARDIOGRAM (TEE);  Surgeon: Wellington Hampshire, MD;  Location: [facility] ORS;  Service: Cardiovascular;  Laterality: N/A;    There were no vitals filed for this visit.  Subjective Assessment - 08/19/17 1307    Subjective   Pt. reports he walked three times around the block.     Pertinent History  62 year old male who suffered a CVA on October 28, 2014.    Limitations  L arm use.    Patient Stated Goals  Patient wants to be able to open and close his left hand, use it for daily tasks. Decrease pain in shoulder.     Currently in Pain?  Yes    Pain Score  2     Pain Location  Shoulder    Pain Orientation  Left    Pain Descriptors / Indicators  Aching    Pain Type  Chronic pain    Pain Onset  More than a month ago    Multiple Pain Sites  No         OT TREATMENT  Pt. Education was provided about the importance of keeping nails clipped, and thoroughly washing hishand with soap and water, trying to promote, and maintan good skin integrity through the palmar aspect of his  left hand.  Therapeutic Exercise:  Pt. Tolerated PROM in all joint ranges of the LUE, and hand. Pt. responded well to slow gentle stretching, with an inhibitive affect on the tone. ROM was performed in supine and sitting. Pt., and wife were educated about self-ROMtechniques. A Mirror was utilized for for scapular elevation.                         OT Education - 08/19/17 1308    Education provided  Yes    Education Details  LUE functioning, self-ROM    Person(s) Educated  Patient;Spouse    Methods  Verbal cues    Comprehension  Returned demonstration          OT Long Term Goals - 08/12/17 1801      OT LONG TERM GOAL #1   Title  Patient will demonstrate ability to perform self directed ROM of LUE to prepare hand for splint use.     Baseline  patient currently not wearing splint, contractures forming in left hand 08/12/2016: Pt. intermittently wears splint.    Time  8    Period  Weeks  Status  On-going    Target Date  10/07/17      OT LONG TERM GOAL #2   Title  Patient will comply with splint wear, care and schedule with assist to don and doff splint.     Baseline  unable to don splint. 08/12/2017: Pt. requires assist    Time  8    Period  Weeks    Status  On-going    Target Date  10/07/17      OT LONG TERM GOAL #3   Title  Patient will demonstrate understanding of hand hygiene and perform daily with cues.     Baseline  poor hand hygiene at eval     Time  8    Period  Weeks    Status  On-going    Target Date  10/07/17      OT LONG TERM GOAL #4   Title  Patient will demonstrate UB dressing with modified independence.     Baseline  moderate assist at eval, 08/12/2017: Pt. able to donn pullover shirt     Time  6    Period  Weeks    Status  Achieved    Target Date  10/07/17      OT LONG TERM GOAL #5   Title  Pt. will be able to button a shirt with modified independence    Baseline  08/12/2017: Pt. is unable to perform    Time  12    Period   Weeks    Status  New    Target Date  10/07/17            Plan - 08/19/17 1310    Clinical Impression Statement Pt. continues to present with increased tone, and tightness in the LUE, and hand. Pt. education was provided about hand hygiene, and self-ROM. Pt. requires verbal cues, visual demonstration, and reinforcement self-ROM technique. Pt. conitnues to be more active, and has been mowing his lawn, as well as the lawn of several neighbors. 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   Occupational Profile and client history currently impacting functional performance  lasting effects from past CVA, contracture of left hand, patient with no current home program, noncompliance with splint wear, hand hygiene with long nails and tends to hold hand in flexed position.     Occupational performance deficits (Please refer to evaluation for details):  ADL's;IADL's;Rest and Sleep;Leisure    Rehab Potential  Fair    Current Impairments/barriers affecting progress:  Decreased ROM of left hand both active and passive, decreased functional use of left UE, decreased compliance with splint wear and care    OT Frequency  2x / week    OT Duration  8 weeks    OT Treatment/Interventions  Self-care/ADL training;Moist Heat;DME and/or AE instruction;Splinting;Balance training;Therapeutic activities;Therapeutic exercise;Cognitive remediation/compensation;Neuromuscular education;Passive range of motion;Manual Therapy;Patient/family education    Clinical Decision Making  Several treatment options, min-mod task modification necessary    Consulted and Agree with Plan of Care  Patient;Family member/caregiver    Family Member Consulted  wife       Patient will benefit from skilled therapeutic intervention in order to improve the following deficits and impairments:  Decreased cognition, Decreased knowledge of use of DME, Decreased skin integrity, Impaired flexibility, Pain,  Decreased coordination, Decreased activity tolerance, Decreased endurance, Decreased range of motion, Decreased strength, Decreased balance, Difficulty walking, Impaired UE functional use  Visit Diagnosis: Muscle weakness (generalized)    Problem List Patient  Active Problem List   Diagnosis Date Noted  . Stroke ([place]) 10/27/2014  . Lymphadenitis 04/09/2011  . HTN (hypertension) 04/09/2011  . DM (diabetes mellitus) ([place]) 04/09/2011    Luberta Mutter, OTR/L 08/19/2017, 2:25 PM  [place] MAIN [facility] SERVICES [address] [place], [place], [address] Phone: [phone]   Fax:  [phone]  Name: Daniel [name] MRN: [id number] Date of Birth: 1955-05-31

## 2017-08-21 ENCOUNTER — Ambulatory Visit: Payer: Medicare HMO | Admitting: Occupational Therapy

## 2017-08-28 ENCOUNTER — Ambulatory Visit: Payer: Medicare HMO | Admitting: Occupational Therapy

## 2017-08-29 ENCOUNTER — Ambulatory Visit
Admission: RE | Admit: 2017-08-29 | Discharge: 2017-08-29 | Disposition: A | Discharge status: Home / Self Care | Payer: Medicare HMO | Source: Ambulatory Visit | Attending: Internal Medicine | Admitting: Internal Medicine

## 2017-08-29 DIAGNOSIS — N19 Unspecified kidney failure: Secondary | ICD-10-CM | POA: Diagnosis not present

## 2017-09-02 ENCOUNTER — Encounter: Payer: Medicare HMO | Admitting: Occupational Therapy

## 2017-09-04 ENCOUNTER — Encounter: Payer: Medicare HMO | Admitting: Occupational Therapy

## 2017-09-10 ENCOUNTER — Encounter: Payer: Medicare HMO | Admitting: Occupational Therapy

## 2017-09-12 ENCOUNTER — Ambulatory Visit: Payer: Medicare HMO | Attending: Neurology | Admitting: Occupational Therapy

## 2017-09-17 ENCOUNTER — Ambulatory Visit: Payer: Medicare HMO | Admitting: Occupational Therapy

## 2017-09-19 ENCOUNTER — Ambulatory Visit: Payer: Medicare HMO | Admitting: Occupational Therapy

## 2017-09-24 ENCOUNTER — Encounter: Payer: Medicare HMO | Admitting: Occupational Therapy

## 2017-09-26 ENCOUNTER — Encounter: Payer: Medicare HMO | Admitting: Occupational Therapy

## 2017-10-01 ENCOUNTER — Encounter: Payer: Medicare HMO | Admitting: Occupational Therapy

## 2017-10-03 ENCOUNTER — Encounter: Payer: Medicare HMO | Admitting: Occupational Therapy

## 2017-10-07 ENCOUNTER — Encounter: Payer: Medicare HMO | Admitting: Occupational Therapy

## 2017-10-09 ENCOUNTER — Encounter: Payer: Medicare HMO | Admitting: Occupational Therapy

## 2017-10-23 ENCOUNTER — Ambulatory Visit: Payer: Medicare HMO | Admitting: Urology

## 2017-11-27 ENCOUNTER — Ambulatory Visit: Payer: Medicare HMO | Admitting: Urology

## 2017-12-31 ENCOUNTER — Ambulatory Visit: Payer: Medicare HMO | Admitting: Urology

## 2017-12-31 ENCOUNTER — Encounter: Payer: Self-pay | Admitting: Urology

## 2018-03-12 DIAGNOSIS — Z0000 Encounter for general adult medical examination without abnormal findings: Secondary | ICD-10-CM | POA: Diagnosis not present

## 2018-03-12 DIAGNOSIS — E1159 Type 2 diabetes mellitus with other circulatory complications: Secondary | ICD-10-CM | POA: Diagnosis not present

## 2018-03-12 DIAGNOSIS — Z125 Encounter for screening for malignant neoplasm of prostate: Secondary | ICD-10-CM | POA: Diagnosis not present

## 2018-03-12 DIAGNOSIS — Z23 Encounter for immunization: Secondary | ICD-10-CM | POA: Diagnosis not present

## 2018-03-12 DIAGNOSIS — R569 Unspecified convulsions: Secondary | ICD-10-CM | POA: Diagnosis not present

## 2018-03-12 DIAGNOSIS — I10 Essential (primary) hypertension: Secondary | ICD-10-CM | POA: Diagnosis not present

## 2018-03-12 DIAGNOSIS — F1020 Alcohol dependence, uncomplicated: Secondary | ICD-10-CM | POA: Diagnosis not present

## 2018-03-12 DIAGNOSIS — I69354 Hemiplegia and hemiparesis following cerebral infarction affecting left non-dominant side: Secondary | ICD-10-CM | POA: Diagnosis not present

## 2018-03-12 DIAGNOSIS — F0151 Vascular dementia with behavioral disturbance: Secondary | ICD-10-CM | POA: Diagnosis not present

## 2018-03-20 ENCOUNTER — Encounter: Payer: Self-pay | Admitting: Occupational Therapy

## 2018-03-20 DIAGNOSIS — M6281 Muscle weakness (generalized): Secondary | ICD-10-CM

## 2018-03-20 NOTE — Therapy (Signed)
[place] MAIN [facility] SERVICES [address] [place], [place], [address] Phone: [phone]   Fax:  [phone]  March 20, 2018      Occupational Therapy Discharge Summary   Patient: Daniel [name] MRN: [id number] Date of Birth: Oct 30, 1955  Diagnosis: Muscle weakness (generalized)  No data recorded  The above patient had been seen in Occupational Therapy for 14 scheduled OT visits with multiple no shows, and cancellations.   The treatment consisted of ADL training, A/E training, UE there. Ex, neuro muscular re-education, and pt./family education. The patient is: improved   Subjective: Pt. has not returned to therapy since the last treatment visit date.   OT Long Term Goals - 08/12/17 1801      OT LONG TERM GOAL #1   Title  Patient will demonstrate ability to perform self directed ROM of LUE to prepare hand for splint use.     Baseline  patient currently not wearing splint, contractures forming in left hand 08/12/2016: Pt. intermittently wears splint.    Time  8    Period  Weeks    Status  On-going    Target Date  10/07/17      OT LONG TERM GOAL #2   Title  Patient will comply with splint wear, care and schedule with assist to don and doff splint.     Baseline  unable to don splint. 08/12/2017: Pt. requires assist    Time  8    Period  Weeks    Status  On-going    Target Date  10/07/17      OT LONG TERM GOAL #3   Title  Patient will demonstrate understanding of hand hygiene and perform daily with cues.     Baseline  poor hand hygiene at eval     Time  8    Period  Weeks    Status  On-going    Target Date  10/07/17      OT LONG TERM GOAL #4   Title  Patient will demonstrate UB dressing with modified independence.     Baseline  moderate assist at eval, 08/12/2017: Pt. able to donn pullover shirt     Time  6    Period  Weeks    Status  Achieved    Target Date  10/07/17      OT LONG TERM GOAL #5   Title  Pt. will be able to  button a shirt with modified independence    Baseline  08/12/2017: Pt. is unable to perform    Time  12    Period  Weeks    Status  New    Target Date  10/07/17              Harrel Carina, MS, OTR/L  CC @CCLISTRESTNAME @  [facility] [address] [place], [place], [address] Phone: [phone]   Fax:  [phone]  Patient: [name] MRN: [id number] Date of Birth: 21-Jun-1955

## 2018-07-15 DIAGNOSIS — N183 Chronic kidney disease, stage 3 (moderate): Secondary | ICD-10-CM | POA: Diagnosis not present

## 2018-07-15 DIAGNOSIS — R569 Unspecified convulsions: Secondary | ICD-10-CM | POA: Diagnosis not present

## 2018-07-15 DIAGNOSIS — E7800 Pure hypercholesterolemia, unspecified: Secondary | ICD-10-CM | POA: Diagnosis not present

## 2018-07-15 DIAGNOSIS — I10 Essential (primary) hypertension: Secondary | ICD-10-CM | POA: Diagnosis not present

## 2018-07-15 DIAGNOSIS — I129 Hypertensive chronic kidney disease with stage 1 through stage 4 chronic kidney disease, or unspecified chronic kidney disease: Secondary | ICD-10-CM | POA: Diagnosis not present

## 2018-07-15 DIAGNOSIS — I69354 Hemiplegia and hemiparesis following cerebral infarction affecting left non-dominant side: Secondary | ICD-10-CM | POA: Diagnosis not present

## 2018-07-15 DIAGNOSIS — F0151 Vascular dementia with behavioral disturbance: Secondary | ICD-10-CM | POA: Diagnosis not present

## 2018-07-15 DIAGNOSIS — F1020 Alcohol dependence, uncomplicated: Secondary | ICD-10-CM | POA: Diagnosis not present

## 2018-10-08 DIAGNOSIS — R569 Unspecified convulsions: Secondary | ICD-10-CM | POA: Diagnosis not present

## 2018-10-08 DIAGNOSIS — I69354 Hemiplegia and hemiparesis following cerebral infarction affecting left non-dominant side: Secondary | ICD-10-CM | POA: Diagnosis not present

## 2018-10-08 DIAGNOSIS — I63511 Cerebral infarction due to unspecified occlusion or stenosis of right middle cerebral artery: Secondary | ICD-10-CM | POA: Diagnosis not present

## 2018-11-30 ENCOUNTER — Other Ambulatory Visit: Payer: Self-pay

## 2018-11-30 ENCOUNTER — Inpatient Hospital Stay
Admission: EM | Admit: 2018-11-30 | Discharge: 2018-12-02 | DRG: 101 | Disposition: A | Discharge status: Home Health | Payer: Medicare Other | Attending: Internal Medicine | Admitting: Internal Medicine

## 2018-11-30 ENCOUNTER — Inpatient Hospital Stay: Payer: Medicare Other

## 2018-11-30 DIAGNOSIS — F10129 Alcohol abuse with intoxication, unspecified: Secondary | ICD-10-CM | POA: Diagnosis present

## 2018-11-30 DIAGNOSIS — N179 Acute kidney failure, unspecified: Secondary | ICD-10-CM | POA: Diagnosis present

## 2018-11-30 DIAGNOSIS — Z79899 Other long term (current) drug therapy: Secondary | ICD-10-CM | POA: Diagnosis not present

## 2018-11-30 DIAGNOSIS — I63511 Cerebral infarction due to unspecified occlusion or stenosis of right middle cerebral artery: Secondary | ICD-10-CM | POA: Diagnosis not present

## 2018-11-30 DIAGNOSIS — Z1159 Encounter for screening for other viral diseases: Secondary | ICD-10-CM | POA: Diagnosis not present

## 2018-11-30 DIAGNOSIS — R531 Weakness: Secondary | ICD-10-CM | POA: Diagnosis not present

## 2018-11-30 DIAGNOSIS — G40909 Epilepsy, unspecified, not intractable, without status epilepticus: Secondary | ICD-10-CM | POA: Diagnosis not present

## 2018-11-30 DIAGNOSIS — K7010 Alcoholic hepatitis without ascites: Secondary | ICD-10-CM | POA: Diagnosis not present

## 2018-11-30 DIAGNOSIS — E785 Hyperlipidemia, unspecified: Secondary | ICD-10-CM | POA: Diagnosis not present

## 2018-11-30 DIAGNOSIS — I69359 Hemiplegia and hemiparesis following cerebral infarction affecting unspecified side: Secondary | ICD-10-CM | POA: Diagnosis not present

## 2018-11-30 DIAGNOSIS — E872 Acidosis: Secondary | ICD-10-CM | POA: Diagnosis not present

## 2018-11-30 DIAGNOSIS — I69354 Hemiplegia and hemiparesis following cerebral infarction affecting left non-dominant side: Secondary | ICD-10-CM

## 2018-11-30 DIAGNOSIS — R569 Unspecified convulsions: Secondary | ICD-10-CM | POA: Diagnosis not present

## 2018-11-30 DIAGNOSIS — E8729 Other acidosis: Secondary | ICD-10-CM

## 2018-11-30 DIAGNOSIS — G9341 Metabolic encephalopathy: Secondary | ICD-10-CM | POA: Diagnosis present

## 2018-11-30 DIAGNOSIS — R404 Transient alteration of awareness: Secondary | ICD-10-CM | POA: Diagnosis not present

## 2018-11-30 DIAGNOSIS — G8190 Hemiplegia, unspecified affecting unspecified side: Secondary | ICD-10-CM | POA: Diagnosis not present

## 2018-11-30 DIAGNOSIS — Z03818 Encounter for observation for suspected exposure to other biological agents ruled out: Secondary | ICD-10-CM | POA: Diagnosis not present

## 2018-11-30 DIAGNOSIS — G9340 Encephalopathy, unspecified: Secondary | ICD-10-CM | POA: Diagnosis present

## 2018-11-30 DIAGNOSIS — N183 Chronic kidney disease, stage 3 (moderate): Secondary | ICD-10-CM | POA: Diagnosis present

## 2018-11-30 HISTORY — DX: Unspecified convulsions: R56.9

## 2018-11-30 LAB — COMPREHENSIVE METABOLIC PANEL
ALT: 81 U/L — ABNORMAL HIGH (ref 0–44)
AST: 80 U/L — ABNORMAL HIGH (ref 15–41)
Albumin: 4.7 g/dL (ref 3.5–5.0)
Alkaline Phosphatase: 80 U/L (ref 38–126)
Anion gap: 22 — ABNORMAL HIGH (ref 5–15)
BUN: 26 mg/dL — ABNORMAL HIGH (ref 8–23)
CO2: 12 mmol/L — ABNORMAL LOW (ref 22–32)
Calcium: 9.3 mg/dL (ref 8.9–10.3)
Chloride: 107 mmol/L (ref 98–111)
Creatinine, Ser: 2.47 mg/dL — ABNORMAL HIGH (ref 0.61–1.24)
GFR calc Af Amer: 31 mL/min — ABNORMAL LOW (ref >60)
GFR calc non Af Amer: 27 mL/min — ABNORMAL LOW (ref >60)
Glucose, Bld: 196 mg/dL — ABNORMAL HIGH (ref 70–99)
Potassium: 3.6 mmol/L (ref 3.5–5.1)
Sodium: 141 mmol/L (ref 135–145)
Total Bilirubin: 1.3 mg/dL — ABNORMAL HIGH (ref 0.3–1.2)
Total Protein: 8.8 g/dL — ABNORMAL HIGH (ref 6.5–8.1)

## 2018-11-30 LAB — CBC WITH DIFFERENTIAL/PLATELET
Abs Immature Granulocytes: 0.02 10*3/uL (ref 0.00–0.07)
Basophils Absolute: 0.1 10*3/uL (ref 0.0–0.1)
Basophils Relative: 1 %
Eosinophils Absolute: 0.4 10*3/uL (ref 0.0–0.5)
Eosinophils Relative: 5 %
HCT: 44 % (ref 39.0–52.0)
Hemoglobin: 13.3 g/dL (ref 13.0–17.0)
Immature Granulocytes: 0 %
Lymphocytes Relative: 47 %
Lymphs Abs: 3.9 10*3/uL (ref 0.7–4.0)
MCH: 27.2 pg (ref 26.0–34.0)
MCHC: 30.2 g/dL (ref 30.0–36.0)
MCV: 90 fL (ref 80.0–100.0)
Monocytes Absolute: 0.9 10*3/uL (ref 0.1–1.0)
Monocytes Relative: 11 %
Neutro Abs: 3 10*3/uL (ref 1.7–7.7)
Neutrophils Relative %: 36 %
Platelets: 203 10*3/uL (ref 150–400)
RBC: 4.89 MIL/uL (ref 4.22–5.81)
RDW: 13.3 % (ref 11.5–15.5)
WBC: 8.3 10*3/uL (ref 4.0–10.5)
nRBC: 0 % (ref 0.0–0.2)

## 2018-11-30 MED ORDER — ONDANSETRON HCL 4 MG PO TABS: 4.0000 mg | ORAL_TABLET | Freq: Four times a day (QID) | ORAL | Status: DC | PRN

## 2018-11-30 MED ORDER — DEXTROSE-NACL 5-0.45 % IV SOLN: Freq: Once | INTRAVENOUS | Status: DC

## 2018-11-30 MED ORDER — DULOXETINE HCL 30 MG PO CPEP
60.0000 mg | ORAL_CAPSULE | Freq: Every day | ORAL | Status: DC
  Administered 2018-12-01 – 2018-12-02 (×2): 60 mg via ORAL
  Filled 2018-11-30 (×2): qty 2

## 2018-11-30 MED ORDER — LORAZEPAM 2 MG/ML IJ SOLN
INTRAMUSCULAR | Status: AC
  Administered 2018-11-30: 13:00:00 4 mg via INTRAVENOUS
  Filled 2018-11-30: qty 2

## 2018-11-30 MED ORDER — ACETAMINOPHEN 650 MG RE SUPP: 650.0000 mg | Freq: Four times a day (QID) | RECTAL | Status: DC | PRN

## 2018-11-30 MED ORDER — ACETAMINOPHEN 325 MG PO TABS
650.0000 mg | ORAL_TABLET | Freq: Four times a day (QID) | ORAL | Status: DC | PRN
  Administered 2018-11-30 – 2018-12-01 (×2): 650 mg via ORAL
  Filled 2018-11-30 (×2): qty 2

## 2018-11-30 MED ORDER — NORTRIPTYLINE HCL 25 MG PO CAPS
50.0000 mg | ORAL_CAPSULE | Freq: Every day | ORAL | Status: DC
  Administered 2018-12-01: 50 mg via ORAL
  Filled 2018-11-30 (×3): qty 2

## 2018-11-30 MED ORDER — DULOXETINE HCL 30 MG PO CPEP
30.0000 mg | ORAL_CAPSULE | Freq: Every day | ORAL | Status: DC
  Administered 2018-12-01 – 2018-12-02 (×2): 30 mg via ORAL
  Filled 2018-11-30 (×2): qty 1

## 2018-11-30 MED ORDER — SODIUM CHLORIDE 0.9 % IV BOLUS
1000.0000 mL | Freq: Once | INTRAVENOUS | Status: AC
  Administered 2018-11-30: 1000 mL via INTRAVENOUS

## 2018-11-30 MED ORDER — DEXTROSE 5 % AND 0.45 % NACL IV BOLUS
1000.0000 mL | Freq: Once | INTRAVENOUS | Status: AC
  Administered 2018-11-30: 14:00:00 1000 mL via INTRAVENOUS
  Filled 2018-11-30: qty 1000

## 2018-11-30 MED ORDER — HEPARIN SODIUM (PORCINE) 5000 UNIT/ML IJ SOLN
5000.0000 [IU] | Freq: Three times a day (TID) | INTRAMUSCULAR | Status: DC
  Administered 2018-11-30 – 2018-12-02 (×5): 5000 [IU] via SUBCUTANEOUS
  Filled 2018-11-30 (×5): qty 1

## 2018-11-30 MED ORDER — GABAPENTIN 600 MG PO TABS
600.0000 mg | ORAL_TABLET | Freq: Three times a day (TID) | ORAL | Status: DC
  Administered 2018-12-01: 600 mg via ORAL
  Filled 2018-11-30 (×2): qty 1

## 2018-11-30 MED ORDER — LORAZEPAM 2 MG/ML IJ SOLN
4.0000 mg | Freq: Once | INTRAMUSCULAR | Status: AC
  Administered 2018-11-30: 13:00:00 4 mg via INTRAVENOUS

## 2018-11-30 MED ORDER — TAMSULOSIN HCL 0.4 MG PO CAPS
0.4000 mg | ORAL_CAPSULE | Freq: Every day | ORAL | Status: DC
  Administered 2018-11-30 – 2018-12-01 (×2): 0.4 mg via ORAL
  Filled 2018-11-30 (×2): qty 1

## 2018-11-30 MED ORDER — ATORVASTATIN CALCIUM 20 MG PO TABS
40.0000 mg | ORAL_TABLET | Freq: Every day | ORAL | Status: DC
  Administered 2018-12-01 – 2018-12-02 (×2): 40 mg via ORAL
  Filled 2018-11-30 (×2): qty 2

## 2018-11-30 MED ORDER — SODIUM CHLORIDE 0.9 % IV SOLN
INTRAVENOUS | Status: DC
  Administered 2018-11-30 – 2018-12-02 (×4): via INTRAVENOUS

## 2018-11-30 MED ORDER — LAMOTRIGINE 100 MG PO TABS
100.0000 mg | ORAL_TABLET | Freq: Two times a day (BID) | ORAL | Status: DC
  Administered 2018-11-30 – 2018-12-02 (×4): 100 mg via ORAL
  Filled 2018-11-30 (×4): qty 1

## 2018-11-30 MED ORDER — ONDANSETRON HCL 4 MG/2ML IJ SOLN: 4.0000 mg | Freq: Four times a day (QID) | INTRAMUSCULAR | Status: DC | PRN

## 2018-11-30 NOTE — Progress Notes (Signed)
Family Meeting Note  Advance Directive:no  Today a meeting took place with the wife Mrs Bellissimo on phone is being admitted for breakthrough seizures in the setting of alcohol intoxication and alcoholic ketoacidosis patient has history of previous stroke. He has history of seizures thereafter since 2016. Last major she seizure was about a year and a half ago according to the wife. I discuss code status with Mrs. Steinke. She said she hasn't really talked with patient about that. She wants him to be a full code for now.  Time spent 16 minutes  Fritzi Mandes, MD

## 2018-11-30 NOTE — ED Notes (Signed)
ED TO INPATIENT HANDOFF REPORT  ED Nurse Name and Phone #: Janett Billow [phone]  S Name/Age/Gender Daniel [name] 63 y.o. male Room/Bed: ED13A/ED13A  Code Status   Code Status: Not on file  Home/SNF/Other Home Patient oriented to: self Is this baseline? No   Triage Complete: Triage complete  Chief Complaint Seizures   Triage Note Pt comes from home with possible seizure. Pt was sitting on toliet "out of it" per wife. Had some incontinence with episode. Pt oriented to place, person, time, but doesn't remember situation. Pt has hx of seizures and takes lamitical. Hx of stroke in 2016. Pt has left sided deficits and reports the current deficits are his baseline. CBG 176.    Allergies No Known Allergies  Level of Care/Admitting Diagnosis ED Disposition    ED Disposition Condition [facility] Area: [place] [100120]  Level of Care: Med-Surg [16]  Covid Evaluation: Asymptomatic Screening Protocol (No Symptoms)  Diagnosis: Acute encephalopathy [245809]  Admitting Physician: Odessa Fleming  Attending Physician: Odessa Fleming  Estimated length of stay: past midnight tomorrow  Certification:: I certify this patient will need inpatient services for at least 2 midnights  PT Class (Do Not Modify): Inpatient [101]  PT Acc Code (Do Not Modify): Private [1]       B Medical/Surgery History Past Medical History:  Diagnosis Date  . Seizures ([place])   . Stroke [facility]) 2016   History reviewed. No pertinent surgical history.   A IV Location/Drains/Wounds Patient Lines/Drains/Airways Status   Active Line/Drains/Airways    Name:   Placement date:   Placement time:   Site:   Days:   Peripheral IV 11/30/18 Left Hand   11/30/18    1230    Hand   less than 1          Intake/Output Last 24 hours No intake or output data in the 24 hours ending 11/30/18 1507  Labs/Imaging Results for orders placed or performed during the hospital encounter of  11/30/18 (from the past 48 hour(s))  Comprehensive metabolic panel     Status: Abnormal   Collection Time: 11/30/18 12:44 PM  Result Value Ref Range   Sodium 141 135 - 145 mmol/L    Comment: LYTES RERUN  MLK   Potassium 3.6 3.5 - 5.1 mmol/L    Comment: HEMOLYSIS AT THIS LEVEL MAY AFFECT RESULT   Chloride 107 98 - 111 mmol/L   CO2 12 (L) 22 - 32 mmol/L   Glucose, Bld 196 (H) 70 - 99 mg/dL   BUN 26 (H) 8 - 23 mg/dL   Creatinine, Ser 2.47 (H) 0.61 - 1.24 mg/dL   Calcium 9.3 8.9 - 10.3 mg/dL   Total Protein 8.8 (H) 6.5 - 8.1 g/dL   Albumin 4.7 3.5 - 5.0 g/dL   AST 80 (H) 15 - 41 U/L    Comment: HEMOLYSIS AT THIS LEVEL MAY AFFECT RESULT   ALT 81 (H) 0 - 44 U/L   Alkaline Phosphatase 80 38 - 126 U/L   Total Bilirubin 1.3 (H) 0.3 - 1.2 mg/dL    Comment: HEMOLYSIS AT THIS LEVEL MAY AFFECT RESULT   GFR calc non Af Amer 27 (L) >60 mL/min   GFR calc Af Amer 31 (L) >60 mL/min   Anion gap 22 (H) 5 - 15    Comment: Performed at [facility], [address], [place], [place] [address]  CBC with Differential     Status: None   Collection  Time: 11/30/18 12:44 PM  Result Value Ref Range   WBC 8.3 4.0 - 10.5 K/uL   RBC 4.89 4.22 - 5.81 MIL/uL   Hemoglobin 13.3 13.0 - 17.0 g/dL   HCT 44.0 39.0 - 52.0 %   MCV 90.0 80.0 - 100.0 fL   MCH 27.2 26.0 - 34.0 pg   MCHC 30.2 30.0 - 36.0 g/dL   RDW 13.3 11.5 - 15.5 %   Platelets 203 150 - 400 K/uL   nRBC 0.0 0.0 - 0.2 %   Neutrophils Relative % 36 %   Neutro Abs 3.0 1.7 - 7.7 K/uL   Lymphocytes Relative 47 %   Lymphs Abs 3.9 0.7 - 4.0 K/uL   Monocytes Relative 11 %   Monocytes Absolute 0.9 0.1 - 1.0 K/uL   Eosinophils Relative 5 %   Eosinophils Absolute 0.4 0.0 - 0.5 K/uL   Basophils Relative 1 %   Basophils Absolute 0.1 0.0 - 0.1 K/uL   Immature Granulocytes 0 %   Abs Immature Granulocytes 0.02 0.00 - 0.07 K/uL    Comment: Performed at [facility], [address] [place], [place] [address]   No results found.  Pending  Labs Unresulted Labs (From admission, onward)    Start     Ordered   11/30/18 1441  Novel Coronavirus,NAA,(SEND-OUT TO REF LAB - TAT 24-48 hrs); Hosp Order  (Asymptomatic Patients Labs)  ONCE - STAT,   STAT    Question:  Rule Out  Answer:  Yes   11/30/18 1440   11/30/18 1417  Urinalysis, Complete w Microscopic  ONCE - STAT,   STAT     11/30/18 1416   Signed and Held  CBC  (heparin)  Once,   R    Comments: Baseline for heparin therapy IF NOT ALREADY DRAWN.  Notify MD if PLT < 100 K.    Signed and Held   Signed and Held  Creatinine, serum  (heparin)  Once,   R    Comments: Baseline for heparin therapy IF NOT ALREADY DRAWN.    Signed and Held          Vitals/Pain Today's Vitals   11/30/18 1300 11/30/18 1330 11/30/18 1400 11/30/18 1430  BP: (!) 90/59 105/75 125/89 (!) 128/91  Pulse: (!) 118 (!) 110 (!) 109   Resp: 19 18 18    Temp:      TempSrc:      SpO2: 99% 91% 95%   Weight:      Height:      PainSc:        Isolation Precautions No active isolations  Medications Medications  LORazepam (ATIVAN) injection 4 mg (4 mg Intravenous Given 11/30/18 1234)  sodium chloride 0.9 % bolus 1,000 mL (1,000 mLs Intravenous Bolus 11/30/18 1246)  dextrose 5 % and 0.45% NaCl 5-0.45 % bolus 1,000 mL (1,000 mLs Intravenous Bolus 11/30/18 1415)    Mobility non-ambulatory (due to postictial status at this time) High fall risk   Focused Assessments Neuro Assessment Handoff:  Swallow screen pass? Not able to be done Cardiac Rhythm: Sinus tachycardia, Normal sinus rhythm   Last date known well: 11/30/18   Neuro Assessment: Exceptions to WDL       R Recommendations: See Admitting Provider Note  Report given to:   Additional Notes:

## 2018-11-30 NOTE — ED Notes (Signed)
Patient transported to CT 

## 2018-11-30 NOTE — ED Provider Notes (Signed)
[facility] Emergency Department Provider Note  ____________________________________________  Time seen: Approximately 2:04 PM  I have reviewed the triage vital signs and the nursing notes.   HISTORY  Chief Complaint Seizures    Level 5 Caveat: Portions of the History and Physical including HPI and review of systems are unable to be completely obtained due to patient being altered mental status  HPI Daniel [name] is a 63 y.o. male with a history of seizures and stroke who is sent to the ED today by EMS after having a suspected seizure at home.  He has a history of seizures, is on Lamictal twice daily which he normally takes, but wife is reported that last night the patient was drinking heavily and so she did not give him his Lamictal because she is worried about interaction.  No falls or head trauma.  Previously he was in his usual state of health.  Sees Dr. Manuella Ghazi with neurology and Dr. Ouida Sills her primary care.      Past Medical History:  Diagnosis Date  . Seizures ([place])   . Stroke [facility]) 2016     There are no active problems to display for this patient.    History reviewed. No pertinent surgical history.   Prior to Admission medications   Medication Sig Start Date End Date Taking? Authorizing Provider  atorvastatin (LIPITOR) 40 MG tablet Take 40 mg by mouth daily. 08/17/18  Yes [provider]  DULoxetine (CYMBALTA) 30 MG capsule Take 30 mg by mouth daily. Take with 60 mg 10/21/18  Yes [provider]  DULoxetine (CYMBALTA) 60 MG capsule Take 60 mg by mouth daily. Takes with 30 mg 10/24/18  Yes [provider]  gabapentin (NEURONTIN) 600 MG tablet Take 600 mg by mouth 3 (three) times daily. 08/17/18  Yes [provider]  lamoTRIgine (LAMICTAL) 100 MG tablet Take 100 mg by mouth 2 (two) times a day. 08/17/18  Yes [provider]  lisinopril-hydrochlorothiazide (ZESTORETIC) 20-25 MG tablet Take 1 tablet by  mouth daily. 08/17/18  Yes [provider]  nortriptyline (PAMELOR) 50 MG capsule Take 50 mg by mouth at bedtime. 11/04/18  Yes [provider]  tamsulosin (FLOMAX) 0.4 MG CAPS capsule Take 0.4 mg by mouth at bedtime. 08/17/18  Yes [provider]     Allergies Patient has no known allergies.   History reviewed. No pertinent family history.  Social History Social History   Tobacco Use  . Smoking status: Not on file  Substance Use Topics  . Alcohol use: Not on file  . Drug use: Not on file  Positive for occasional smoking and heavy alcohol use  Review of Systems Level 5 Caveat: Portions of the History and Physical including HPI and review of systems are unable to be completely obtained due to patient being a poor historian   Constitutional:   No known fever.  ENT:   No rhinorrhea. Cardiovascular:   No chest pain or syncope. Respiratory:   No dyspnea or cough. Gastrointestinal:   Negative for abdominal pain, vomiting and diarrhea.  Musculoskeletal:   Negative for focal pain or swelling ____________________________________________   PHYSICAL EXAM:  VITAL SIGNS: ED Triage Vitals  Enc Vitals Group     BP 11/30/18 1225 138/87     Pulse Rate 11/30/18 1225 100     Resp 11/30/18 1225 18     Temp 11/30/18 1225 98 F (36.7 C)     Temp Source 11/30/18 1225 Axillary     SpO2  11/30/18 1225 93 %     Weight 11/30/18 1231 200 lb (90.7 kg)     Height 11/30/18 1231 6' (1.829 m)     Head Circumference --      Peak Flow --      Pain Score 11/30/18 1231 0     Pain Loc --      Pain Edu? --      Excl. in [place]? --     Vital signs reviewed, nursing assessments reviewed.   Constitutional:   Alert and oriented. Non-toxic appearance. Eyes:   Conjunctivae are normal. EOMI. PERRL.  Left pupil postsurgical, irregular shaped ENT      Head:   Normocephalic and atraumatic.      Nose:   No congestion/rhinnorhea.       Mouth/Throat:   MMM, no pharyngeal erythema. No  peritonsillar mass.       Neck:   No meningismus. Full ROM. Hematological/Lymphatic/Immunilogical:   No cervical lymphadenopathy. Cardiovascular:   Tachycardia heart rate 120. Symmetric bilateral radial and DP pulses.  No murmurs. Cap refill less than 2 seconds. Respiratory:   Normal respiratory effort without tachypnea/retractions. Breath sounds are clear and equal bilaterally. No wheezes/rales/rhonchi. Gastrointestinal:   Soft and nontender. Non distended. There is no CVA tenderness.  No rebound, rigidity, or guarding.  Musculoskeletal:   Normal range of motion in all extremities. No joint effusions.  No lower extremity tenderness.  No edema. Neurologic:   Normal speech and language.  Motor grossly intact. No acute focal neurologic deficits are appreciated.  Skin:    Skin is warm, dry and intact. No rash noted.  No petechiae, purpura, or bullae.  ____________________________________________    LABS (pertinent positives/negatives) (all labs ordered are listed, but only abnormal results are displayed) Labs Reviewed  COMPREHENSIVE METABOLIC PANEL - Abnormal; Notable for the following components:      Result Value   CO2 12 (*)    Glucose, Bld 196 (*)    BUN 26 (*)    Creatinine, Ser 2.47 (*)    Total Protein 8.8 (*)    AST 80 (*)    ALT 81 (*)    Total Bilirubin 1.3 (*)    GFR calc non Af Amer 27 (*)    GFR calc Af Amer 31 (*)    Anion gap 22 (*)    All other components within normal limits  CBC WITH DIFFERENTIAL/PLATELET  URINALYSIS, COMPLETE (UACMP) WITH MICROSCOPIC  CBG MONITORING, ED    Labs from care everywhere: 07/18/2017 10/02/2016 11/26/2014 11/24/2014 11/22/2014 11/15/2014 11/03/2014   142 140 140   142 142  3.7 3.5 (L) 3.6   4.1 3.7  104 100 105   108 107  35.6 (H) 32.9 (H) 25   24 24  14 12 18   17 22  (H)  1.6 (H) 1.3 1   1.1 1.2  106 146 (H) 84   101 102  9.5 9.2 9.4   9.3 8.9    95 (H) 128 (H) 169 (H) 82 (H) 23    353 (H) 432 (H) 393 (H) 175  (H) 26  1.0 1.0 0.9 0.9 0.9 0.8 1.5    79 79 81 67 56  4.4 4.2 3.9 3.8 3.8 3.8 4  7.4 7.1 7.6 7.3 7.7 7.7 7.8    10   10 11    18   16 18   53 (L) 68 >60   >60 >60  0.20   0.2 0.1    33  18       107 (H) 30       65 51        1.4       8.8        6.1            ____________________________________________   EKG  Interpreted by me Sinus tachycardia rate 124, normal axis intervals QRS ST segments and T waves.  ____________________________________________    RADIOLOGY  No results found.  ____________________________________________   PROCEDURES .Critical Care Performed by: Carrie Mew, MD Authorized by: Carrie Mew, MD   Critical care provider statement:    Critical care time (minutes):  35   Critical care time was exclusive of:  Separately billable procedures and treating other patients   Critical care was necessary to treat or prevent imminent or life-threatening deterioration of the following conditions:  Metabolic crisis, dehydration and CNS failure or compromise   Critical care was time spent personally by me on the following activities:  Development of treatment plan with patient or surrogate, discussions with consultants, evaluation of patient's response to treatment, examination of patient, obtaining history from patient or surrogate, ordering and performing treatments and interventions, ordering and review of laboratory studies, ordering and review of radiographic studies, pulse oximetry, re-evaluation of patient's condition and review of old charts    ____________________________________________  DIFFERENTIAL DIAGNOSIS   Alcohol intoxication, seizure due to medication noncompliance.  Doubt intracranial hemorrhage or acute stroke  CLINICAL IMPRESSION / ASSESSMENT AND PLAN / ED COURSE  Pertinent labs & imaging results that were available during my care of the patient were reviewed by me and considered  in my medical decision making (see chart for details).   Isabella Ida was evaluated in Emergency Department on 11/30/2018 for the symptoms described in the history of present illness. He was evaluated in the context of the global COVID-19 pandemic, which necessitated consideration that the patient might be at risk for infection with the SARS-CoV-2 virus that causes COVID-19. Institutional protocols and algorithms that pertain to the evaluation of patients at risk for COVID-19 are in a state of rapid change based on information released by regulatory bodies including the CDC and federal and state organizations. These policies and algorithms were followed during the patient's care in the ED.   Patient sent to the ED after a suspected seizure at home after heavy drinking yesterday and missing his antiepileptic medicine dose last night.  On arrival to the ED he is alert and oriented.  Will check labs.  Clinical Course as of Nov 29 1437  Sun Nov 30, 2018  1240 Pt had seizure, generalized tonic clonic with eyes and head fixed to right.  Clonus    [PS]  2595 Labs show alcoholic ketoacidosis.  I will add on a urinalysis, give D5 half-normal saline for hydration and reversal of his acidosis.  Given the complicated picture, he will need to be hospitalized to ensure stabilization after hydration and continuing medications.   [PS]    Clinical Course User Index [PS] Carrie Mew, MD      ____________________________________________   FINAL CLINICAL IMPRESSION(S) / ED DIAGNOSES    Final diagnoses:  Seizure [facility])  Alcoholic ketoacidosis     ED Discharge Orders    None      Portions of this note were generated with dragon dictation software. Dictation errors may occur despite best attempts at proofreading.   Carrie Mew, MD 11/30/18 [phone]

## 2018-11-30 NOTE — ED Notes (Signed)
Pt's wife back at bedside. Pt remains sleeping with even respirations and arousable to heaving shaking. Pt's wife tells this RN that pt was drinking heavily last night and skipped his night time dose of seizure medication.

## 2018-11-30 NOTE — ED Triage Notes (Signed)
Pt comes from home with possible seizure. Pt was sitting on toliet "out of it" per wife. Had some incontinence with episode. Pt oriented to place, person, time, but doesn't remember situation. Pt has hx of seizures and takes lamitical. Hx of stroke in 2016. Pt has left sided deficits and reports the current deficits are his baseline. CBG 176.

## 2018-11-30 NOTE — H&P (Signed)
[place] [facility] [place] NAME: Daniel [name]    MR#:  [id number]  DATE OF BIRTH:  Aug 17, 1955  DATE OF ADMISSION:  11/30/2018  PRIMARY CARE PHYSICIAN: Kirk Ruths, MD   REQUESTING/REFERRING PHYSICIAN: Dr. Joni Fears  CHIEF COMPLAINT:  according to the wife patient had a seizure this morning. Patient is sedated from IV Ativan 4 mg that he received prior to my evaluation  HISTORY OF PRESENT ILLNESS:  Daniel [name]  is a 63 y.o. male with a known history of seizure disorder, history of stroke with left sided weakness chronic in 2016, hyperlipidemia comes from home after wife found patient down on the floor in the bathroom when she heard a " loud thud". According to the wife patient had been drinking very heavy yesterday due to the holiday weekend. He did take his antiseizure meds in the morning of yesterday. Missed his medication today.  CT head pending. Patient was found to have acute renal failure with elevated LFTs and anion gap suggestive of alcoholic ketoacidosis and acute renal failure.  PAST MEDICAL HISTORY:   Past Medical History:  Diagnosis Date  . Seizures ([place])   . Stroke [facility]) 2016    PAST SURGICAL HISTOIRY:  History reviewed. No pertinent surgical history.  SOCIAL HISTORY:   Social History   Tobacco Use  . Smoking status: Not on file  Substance Use Topics  . Alcohol use: Not on file    FAMILY HISTORY:  History reviewed. No pertinent family history.  DRUG ALLERGIES:  No Known Allergies  REVIEW OF SYSTEMS:  Review of Systems  Unable to perform ROS: Mental acuity     MEDICATIONS AT HOME:   Prior to Admission medications   Medication Sig Start Date End Date Taking? Authorizing Provider  atorvastatin (LIPITOR) 40 MG tablet Take 40 mg by mouth daily. 08/17/18  Yes [provider]  DULoxetine (CYMBALTA) 30 MG capsule Take 30 mg by mouth daily. Take with 60 mg 10/21/18  Yes [provider]   DULoxetine (CYMBALTA) 60 MG capsule Take 60 mg by mouth daily. Takes with 30 mg 10/24/18  Yes [provider]  gabapentin (NEURONTIN) 600 MG tablet Take 600 mg by mouth 3 (three) times daily. 08/17/18  Yes [provider]  lamoTRIgine (LAMICTAL) 100 MG tablet Take 100 mg by mouth 2 (two) times a day. 08/17/18  Yes [provider]  lisinopril-hydrochlorothiazide (ZESTORETIC) 20-25 MG tablet Take 1 tablet by mouth daily. 08/17/18  Yes [provider]  nortriptyline (PAMELOR) 50 MG capsule Take 50 mg by mouth at bedtime. 11/04/18  Yes [provider]  tamsulosin (FLOMAX) 0.4 MG CAPS capsule Take 0.4 mg by mouth at bedtime. 08/17/18  Yes [provider]      VITAL SIGNS:  Blood pressure 118/81, pulse 94, temperature 98 F (36.7 C), temperature source Axillary, resp. rate 19, height 6' (1.829 m), weight 90.7 kg, SpO2 97 %.  PHYSICAL EXAMINATION:  GENERAL:  63 y.o.-year-old patient lying in the bed with no acute distress.  EYES: Pupils equal, round, reactive to light and accommodation. No scleral icterus. Extraocular muscles intact.  HEENT: Head atraumatic, normocephalic. Oropharynx and nasopharynx clear.  NECK:  Supple, no jugular venous distention. No thyroid enlargement, no tenderness.  LUNGS: Normal breath sounds bilaterally, no wheezing, rales,rhonchi or crepitation. No use of accessory muscles of respiration.  CARDIOVASCULAR: S1, S2 normal. No murmurs, rubs, or gallops.  ABDOMEN: Soft, nontender, nondistended. Bowel sounds present. No organomegaly or mass.  EXTREMITIES: No pedal edema, cyanosis, or clubbing.  NEUROLOGIC: patient has chronic left sided hemiparesis. Grossly otherwise nonfocal. No head trauma noted physically.  PSYCHIATRIC: The patient is sedated with Ativan. Opens eyes to verbal commands however falls back to sleep immediately SKIN: No obvious rash, lesion, or ulcer.   LABORATORY PANEL:   CBC Recent Labs  Lab 11/30/18 1244   WBC 8.3  HGB 13.3  HCT 44.0  PLT 203   ------------------------------------------------------------------------------------------------------------------  Chemistries  Recent Labs  Lab 11/30/18 1244  NA 141  K 3.6  CL 107  CO2 12*  GLUCOSE 196*  BUN 26*  CREATININE 2.47*  CALCIUM 9.3  AST 80*  ALT 81*  ALKPHOS 80  BILITOT 1.3*   ------------------------------------------------------------------------------------------------------------------  Cardiac Enzymes No results for input(s): TROPONINI in the last 168 hours. ------------------------------------------------------------------------------------------------------------------  RADIOLOGY:  No results found.  EKG:    IMPRESSION AND PLAN:  Daniel [name]  is a 63 y.o. male with a known history of seizure disorder, history of stroke with left sided weakness chronic in 2016, hyperlipidemia comes from home after wife found patient down on the floor in the bathroom when she heard a " loud thud". According to the wife patient had been drinking very heavy yesterday due to the holiday weekend. He did take his antiseizure meds in the morning of yesterday. Missed his medication today.  1. Altered mental status with seizures in the setting of known history of chronic seizure disorder with alcohol intoxication/abuse and alcoholic ketoacidosis -admit to medical floor -seizure precautions -IV Ativan PRN -CT had ordered results pending -neurology consultation. Message sent to Dr. Doy Mince -continue Lamictal and Neurontin  2. Alcoholic ketoacidosis -watch for withdrawal -IV fluids -keep patient NPO till he is awake  3. Acute on chronic renal failure -baseline creatinine around 1.25 -came in with creatinine of 2.47 -continue IV hydration, monitor input output -avoid nephrotoxins  4. Hyperlipidemia  -continue atorvastatin  5. DVT prophylaxis subcu heparin  Spoke with patient's wife Mrs. Colleran on the phone. Patient is  a full code. Her phone number is [phone]    All the records are reviewed and case discussed with ED provider.   CODE STATUS: full  TOTAL TIME TAKING CARE OF THIS PATIENT: *50* minutes.    Fritzi Mandes M.D on 11/30/2018 at 3:33 PM  Between 7am to 6pm - Pager - [phone]  After 6pm go to [email or web address] - password [email or web address]  [facility]  Office  [phone]  CC: Primary care physician; Kirk Ruths, MD

## 2018-12-01 DIAGNOSIS — E785 Hyperlipidemia, unspecified: Secondary | ICD-10-CM | POA: Diagnosis not present

## 2018-12-01 DIAGNOSIS — N179 Acute kidney failure, unspecified: Secondary | ICD-10-CM | POA: Diagnosis not present

## 2018-12-01 DIAGNOSIS — Z1159 Encounter for screening for other viral diseases: Secondary | ICD-10-CM | POA: Diagnosis not present

## 2018-12-01 DIAGNOSIS — Z79899 Other long term (current) drug therapy: Secondary | ICD-10-CM | POA: Diagnosis not present

## 2018-12-01 DIAGNOSIS — N183 Chronic kidney disease, stage 3 (moderate): Secondary | ICD-10-CM | POA: Diagnosis not present

## 2018-12-01 DIAGNOSIS — E872 Acidosis: Secondary | ICD-10-CM | POA: Diagnosis not present

## 2018-12-01 DIAGNOSIS — G40909 Epilepsy, unspecified, not intractable, without status epilepticus: Secondary | ICD-10-CM | POA: Diagnosis not present

## 2018-12-01 DIAGNOSIS — F10129 Alcohol abuse with intoxication, unspecified: Secondary | ICD-10-CM | POA: Diagnosis not present

## 2018-12-01 DIAGNOSIS — I69354 Hemiplegia and hemiparesis following cerebral infarction affecting left non-dominant side: Secondary | ICD-10-CM | POA: Diagnosis not present

## 2018-12-01 DIAGNOSIS — R569 Unspecified convulsions: Secondary | ICD-10-CM | POA: Diagnosis not present

## 2018-12-01 DIAGNOSIS — G9341 Metabolic encephalopathy: Secondary | ICD-10-CM | POA: Diagnosis not present

## 2018-12-01 LAB — BASIC METABOLIC PANEL
Anion gap: 7 (ref 5–15)
BUN: 17 mg/dL (ref 8–23)
CO2: 24 mmol/L (ref 22–32)
Calcium: 7.9 mg/dL — ABNORMAL LOW (ref 8.9–10.3)
Chloride: 108 mmol/L (ref 98–111)
Creatinine, Ser: 1.88 mg/dL — ABNORMAL HIGH (ref 0.61–1.24)
GFR calc Af Amer: 43 mL/min — ABNORMAL LOW (ref >60)
GFR calc non Af Amer: 37 mL/min — ABNORMAL LOW (ref >60)
Glucose, Bld: 99 mg/dL (ref 70–99)
Potassium: 3.4 mmol/L — ABNORMAL LOW (ref 3.5–5.1)
Sodium: 139 mmol/L (ref 135–145)

## 2018-12-01 MED ORDER — POTASSIUM CHLORIDE CRYS ER 20 MEQ PO TBCR
40.0000 meq | EXTENDED_RELEASE_TABLET | Freq: Once | ORAL | Status: AC
  Administered 2018-12-01: 40 meq via ORAL
  Filled 2018-12-01: qty 2

## 2018-12-01 MED ORDER — GABAPENTIN 600 MG PO TABS
300.0000 mg | ORAL_TABLET | Freq: Three times a day (TID) | ORAL | Status: DC
  Administered 2018-12-01 – 2018-12-02 (×3): 300 mg via ORAL
  Filled 2018-12-01 (×3): qty 1

## 2018-12-01 NOTE — Consult Note (Signed)
Reason for Consult:seizure Referring Physician: dr. Posey Pronto  CC: seizure [name] not a good historian. HP per chart  HPI: Daniel [name] is an 63 y.o. male admitted with seziure 63 y.o. male with a history of seizures on lamictal and left MCA stroke with residual left spastic hemiparesis  sent to the ED today by EMS after having a suspected seizure at home.   Per patient, he was in bathroom and stated having blurry/dble vision. Per notes, wife reportt heavy drinking, did not take his meds and found him in bathroom floor. Head ct : large chronic right MCA infarct.  No other abnormalities. Patient was found to have acute renal failure with elevated LFTs and anion gap suggestive of alcoholic ketoacidosis and acute renal failure. No ha. No nausea/no vting, no motor deficit   Past Medical History:  Diagnosis Date  . Seizures ([place])   . Stroke [facility]) 2016    History reviewed. No pertinent surgical history.  History reviewed. No pertinent family history.  Social History:  has no history on file for tobacco, alcohol, and drug.  No Known Allergies  Medications: I have reviewed the patient's current medications.  ROS: As per HPI Physical Examination: Blood pressure 113/84, pulse 78, temperature 98.1 F (36.7 C), temperature source Oral, resp. rate 18, height 5\' 5"  (1.651 m), weight 101.2 kg, SpO2 97 %.  Neurologic Examination  Patient lying on bed comfortably, talking on the phone  Patient is alert, awake, oriented x3, speech is dysarthric, follows commands CN: PERLA, EOMI, no nystagmus,LHH 9 questionable, left facial, decrease sensation to touch on left face but difficult to ascertain, tongue and plate midline Motor: 4/5 on LUEX/LLEX, 5//5 on RUEX/R:EX. He is spatic on the LUEX/LLEX with nle bulk  decrease sensation to touch on th left side but difficult to ascertain  Results for orders placed or performed during the hospital encounter of 11/30/18 (from the past 48 hour(s))   Comprehensive metabolic panel     Status: Abnormal   Collection Time: 11/30/18 12:44 PM  Result Value Ref Range   Sodium 141 135 - 145 mmol/L    Comment: LYTES RERUN  MLK   Potassium 3.6 3.5 - 5.1 mmol/L    Comment: HEMOLYSIS AT THIS LEVEL MAY AFFECT RESULT   Chloride 107 98 - 111 mmol/L   CO2 12 (L) 22 - 32 mmol/L   Glucose, Bld 196 (H) 70 - 99 mg/dL   BUN 26 (H) 8 - 23 mg/dL   Creatinine, Ser 2.47 (H) 0.61 - 1.24 mg/dL   Calcium 9.3 8.9 - 10.3 mg/dL   Total Protein 8.8 (H) 6.5 - 8.1 g/dL   Albumin 4.7 3.5 - 5.0 g/dL   AST 80 (H) 15 - 41 U/L    Comment: HEMOLYSIS AT THIS LEVEL MAY AFFECT RESULT   ALT 81 (H) 0 - 44 U/L   Alkaline Phosphatase 80 38 - 126 U/L   Total Bilirubin 1.3 (H) 0.3 - 1.2 mg/dL    Comment: HEMOLYSIS AT THIS LEVEL MAY AFFECT RESULT   GFR calc non Af Amer 27 (L) >60 mL/min   GFR calc Af Amer 31 (L) >60 mL/min   Anion gap 22 (H) 5 - 15    Comment: Performed at [facility], [address] [place], [place] [address]  CBC with Differential     Status: None   Collection Time: 11/30/18 12:44 PM  Result Value Ref Range   WBC 8.3 4.0 - 10.5 K/uL   RBC 4.89 4.22 - 5.81 MIL/uL  Hemoglobin 13.3 13.0 - 17.0 g/dL   HCT 44.0 39.0 - 52.0 %   MCV 90.0 80.0 - 100.0 fL   MCH 27.2 26.0 - 34.0 pg   MCHC 30.2 30.0 - 36.0 g/dL   RDW 13.3 11.5 - 15.5 %   Platelets 203 150 - 400 K/uL   nRBC 0.0 0.0 - 0.2 %   Neutrophils Relative % 36 %   Neutro Abs 3.0 1.7 - 7.7 K/uL   Lymphocytes Relative 47 %   Lymphs Abs 3.9 0.7 - 4.0 K/uL   Monocytes Relative 11 %   Monocytes Absolute 0.9 0.1 - 1.0 K/uL   Eosinophils Relative 5 %   Eosinophils Absolute 0.4 0.0 - 0.5 K/uL   Basophils Relative 1 %   Basophils Absolute 0.1 0.0 - 0.1 K/uL   Immature Granulocytes 0 %   Abs Immature Granulocytes 0.02 0.00 - 0.07 K/uL    Comment: Performed at [facility], [address] [place], [place] [address]  Basic metabolic panel     Status: Abnormal   Collection Time:  12/01/18  7:41 AM  Result Value Ref Range   Sodium 139 135 - 145 mmol/L   Potassium 3.4 (L) 3.5 - 5.1 mmol/L   Chloride 108 98 - 111 mmol/L   CO2 24 22 - 32 mmol/L   Glucose, Bld 99 70 - 99 mg/dL   BUN 17 8 - 23 mg/dL   Creatinine, Ser 1.88 (H) 0.61 - 1.24 mg/dL   Calcium 7.9 (L) 8.9 - 10.3 mg/dL   GFR calc non Af Amer 37 (L) >60 mL/min   GFR calc Af Amer 43 (L) >60 mL/min   Anion gap 7 5 - 15    Comment: Performed at [facility], [address] [place], [place] [address]    No results found for this or any previous visit (from the past 240 hour(s)).  Ct Head Wo Contrast  Result Date: 11/30/2018 CLINICAL DATA:  Suspected seizure EXAM: CT HEAD WITHOUT CONTRAST TECHNIQUE: Contiguous axial images were obtained from the base of the skull through the vertex without intravenous contrast. COMPARISON:  None. FINDINGS: Brain: No subdural, epidural, or subarachnoid hemorrhage. Cerebellum, brainstem, and basal cisterns are normal. There is a right MCA infarct, chronic in appearance, large. There is mild ex vacuo dilatation of the right lateral ventricle. Ventricles are otherwise normal. No mass effect or midline shift. No acute cortical ischemia identified. Vascular: No hyperdense vessel or unexpected calcification. Skull: Normal. Negative for fracture or focal lesion. Sinuses/Orbits: No acute finding. Other: None. IMPRESSION: 1. Large chronic right MCA infarct.  No other abnormalities. Electronically Signed   By: Dorise Bullion III M.D   On: 11/30/2018 16:07     Assessment/Plan: 62 y.o. male with a history of seizures on lamictal and left MCA stroke with residual left spastic hemiparesis. Patient neuro exam seems to be at baseline. Given his h/o of sz he most likely experienced a breakhthru sz in a setting f alcohol drinking combined with his metabolic derangements.  Recs: Neuro protectives measures including normothermia, normoglycemia, correct electrolytes/metabolic abnlites Continue  currents home AEDs: will not change his current regimen (likely provoked sz) unless he experiences more sz. Would not pursue any other explorations for time being since patient seems to be at baseline. PT/OT Discussed case with primary team at bedisde 12/01/2018, 11:22 AM

## 2018-12-01 NOTE — Plan of Care (Signed)

## 2018-12-01 NOTE — Evaluation (Signed)
Physical Therapy Evaluation Patient Details Name: Daniel [name] MRN: [id number] DOB: 08/21/1955 Today's Date: 12/01/2018   History of Present Illness  Patient is a 63 year old male admitted with alcohol induced seizure, acute on chronic renal failure. Patient has PMH to include: seizures, ETOH, HLD, CVA with L side weakness  Clinical Impression  Patient received in bed, agreeable to PT evaluation. Patient requires min assist and cues for safety with supine to sit transfer. Assistance needed to manage lines. Patient slightly impulsive getting up before instructed and before PT ready. Patient required guarding at edge of bed so that he would not slip off and cues to push back onto bed. He then ambulated 6 feet with IV pole for support, another 6 feet without IV pole. Slight unsteadiness and fatigued after. Patient will benefit from continued skilled PT to address his weakness, decreased safety awareness, decreased balance.        Follow Up Recommendations Home health PT    Equipment Recommendations    to be determined   Recommendations for Other Services       Precautions / Restrictions Precautions Precautions: Fall Restrictions Weight Bearing Restrictions: No      Mobility  Bed Mobility Overal bed mobility: Modified Independent             General bed mobility comments: heavy use of rail to perform supine to sit. Cues for safety, patient required R LE guarding once seated as he was sitting on the very edge of the bed and had potential to slide off.  Transfers Overall transfer level: Needs assistance Equipment used: None Transfers: Sit to/from Stand Sit to Stand: Min guard         General transfer comment: impulsive with transfer  Ambulation/Gait Ambulation/Gait assistance: Min guard Gait Distance (Feet): 12 Feet Assistive device: IV Pole Gait Pattern/deviations: Drifts right/left Gait velocity: decreased   General Gait Details: slightly unsteady, ambulated 6 feet  holding IV pole, 6 feet without IV pole.  Stairs            Wheelchair Mobility    Modified Rankin (Stroke Patients Only)       Balance Overall balance assessment: Mild deficits observed, not formally tested                                           Pertinent Vitals/Pain      Home Living Family/patient expects to be discharged to:: Private residence Living Arrangements: Spouse/significant other Available Help at Discharge: Family Type of Home: Apartment Home Access: Level entry     Home Layout: One level Home Equipment: None Additional Comments: unsure of history as patient is poor historian    Prior Function Level of Independence: Independent         Comments: says he was walking outside without AD prior to admission     Hand Dominance        Extremity/Trunk Assessment   Upper Extremity Assessment Upper Extremity Assessment: LUE deficits/detail LUE Deficits / Details: prior CVA affecting left UE LUE Coordination: decreased gross motor    Lower Extremity Assessment Lower Extremity Assessment: Generalized weakness       Communication   Communication: Other (comment)(garbled speech)  Cognition Arousal/Alertness: Awake/alert Behavior During Therapy: Impulsive Overall Cognitive Status: No family/caregiver present to determine baseline cognitive functioning  General Comments      Exercises     Assessment/Plan    PT Assessment Patient needs continued PT services  PT Problem List Decreased strength;Decreased mobility;Decreased safety awareness;Decreased activity tolerance;Decreased balance       PT Treatment Interventions Therapeutic activities;Gait training;Therapeutic exercise;Patient/family education;Balance training;Functional mobility training;Neuromuscular re-education    PT Goals (Current goals can be found in the Care Plan section)  Acute Rehab PT  Goals Patient Stated Goal: to return home at discharge PT Goal Formulation: With patient Time For Goal Achievement: 12/06/18 Potential to Achieve Goals: Good    Frequency Min 2X/week   Barriers to discharge        Co-evaluation               AM-PAC PT "6 Clicks" Mobility  Outcome Measure Help needed turning from your back to your side while in a flat bed without using bedrails?: A Little Help needed moving from lying on your back to sitting on the side of a flat bed without using bedrails?: A Little Help needed moving to and from a bed to a chair (including a wheelchair)?: A Little Help needed standing up from a chair using your arms (e.g., wheelchair or bedside chair)?: A Little Help needed to walk in hospital room?: A Little Help needed climbing 3-5 steps with a railing? : A Lot 6 Click Score: 17    End of Session Equipment Utilized During Treatment: Gait belt Activity Tolerance: Patient limited by fatigue Patient left: in bed;with bed alarm set Nurse Communication: Mobility status PT Visit Diagnosis: Unsteadiness on feet (R26.81);Muscle weakness (generalized) (M62.81);Difficulty in walking, not elsewhere classified (R26.2);History of falling (Z91.81)    Time: 9728-2060 PT Time Calculation (min) (ACUTE ONLY): 17 min   Charges:   PT Evaluation $PT Eval Low Complexity: 1 Low PT Treatments $Gait Training: 8-22 mins        Toinette Lackie, PT, GCS 12/01/18,2:30 PM

## 2018-12-01 NOTE — Progress Notes (Signed)
Patient ID: Daniel [name], male   DOB: 04/04/1956, 63 y.o.   MRN: [id number]  [employer] PROGRESS NOTE  Daniel [name] QMG:[id number] DOB: 1955-10-12 DOA: 11/30/2018 PCP: Kirk Ruths, MD  HPI/Subjective: Patient feeling okay.  Patient brought in after a seizure.  Mental status back to baseline.  Patient has a history of stroke and left-sided weakness.  Objective: Vitals:   12/01/18 0415 12/01/18 1540  BP: 113/84 116/77  Pulse: 78 83  Resp: 18 20  Temp: 98.1 F (36.7 C) 98.5 F (36.9 C)  SpO2: 97% 96%    Intake/Output Summary (Last 24 hours) at 12/01/2018 1541 Last data filed at 12/01/2018 0900 Gross per 24 hour  Intake 240 ml  Output 225 ml  Net 15 ml   Filed Weights   11/30/18 1231 11/30/18 1829  Weight: 90.7 kg 101.2 kg    ROS: Review of Systems  Constitutional: Negative for chills and fever.  Eyes: Negative for blurred vision.  Respiratory: Negative for cough and shortness of breath.   Cardiovascular: Negative for chest pain.  Gastrointestinal: Negative for abdominal pain, constipation, diarrhea, nausea and vomiting.  Genitourinary: Negative for dysuria.  Musculoskeletal: Negative for joint pain.  Neurological: Positive for focal weakness. Negative for dizziness and headaches.   Exam: Physical Exam  HENT:  Nose: No mucosal edema.  Mouth/Throat: No oropharyngeal exudate or posterior oropharyngeal edema.  Eyes: Pupils are equal, round, and reactive to light. Conjunctivae, EOM and lids are normal.  Neck: No JVD present. Carotid bruit is not present. No edema present. No thyroid mass and no thyromegaly present.  Cardiovascular: S1 normal and S2 normal. Exam reveals no gallop.  No murmur heard. Pulses:      Dorsalis pedis pulses are 2+ on the right side and 2+ on the left side.  Respiratory: No respiratory distress. He has no wheezes. He has no rhonchi. He has no rales.  GI: Soft. Bowel sounds are normal. There is no abdominal tenderness.  Musculoskeletal:      Right shoulder: He exhibits no swelling.  Lymphadenopathy:    He has no cervical adenopathy.  Neurological: He is alert.  Left hand contracted and unable to extend his fingers.  Able to straight leg raise bilaterally.  Able to lift arms up off the bed.  Power is better on the right side than the left.  Skin: Skin is warm. No rash noted. Nails show no clubbing.  Psychiatric: He has a normal mood and affect.      Data Reviewed: Basic Metabolic Panel: Recent Labs  Lab 11/30/18 1244 12/01/18 0741  NA 141 139  K 3.6 3.4*  CL 107 108  CO2 12* 24  GLUCOSE 196* 99  BUN 26* 17  CREATININE 2.47* 1.88*  CALCIUM 9.3 7.9*   Liver Function Tests: Recent Labs  Lab 11/30/18 1244  AST 80*  ALT 81*  ALKPHOS 80  BILITOT 1.3*  PROT 8.8*  ALBUMIN 4.7   CBC: Recent Labs  Lab 11/30/18 1244  WBC 8.3  NEUTROABS 3.0  HGB 13.3  HCT 44.0  MCV 90.0  PLT 203     Studies: Ct Head Wo Contrast  Result Date: 11/30/2018 CLINICAL DATA:  Suspected seizure EXAM: CT HEAD WITHOUT CONTRAST TECHNIQUE: Contiguous axial images were obtained from the base of the skull through the vertex without intravenous contrast. COMPARISON:  None. FINDINGS: Brain: No subdural, epidural, or subarachnoid hemorrhage. Cerebellum, brainstem, and basal cisterns are normal. There is a right MCA infarct, chronic in appearance, large. There is mild  ex vacuo dilatation of the right lateral ventricle. Ventricles are otherwise normal. No mass effect or midline shift. No acute cortical ischemia identified. Vascular: No hyperdense vessel or unexpected calcification. Skull: Normal. Negative for fracture or focal lesion. Sinuses/Orbits: No acute finding. Other: None. IMPRESSION: 1. Large chronic right MCA infarct.  No other abnormalities. Electronically Signed   By: Dorise Bullion III M.D   On: 11/30/2018 16:07    Scheduled Meds: . atorvastatin  40 mg Oral Daily  . DULoxetine  30 mg Oral Daily  . DULoxetine  60 mg Oral Daily   . gabapentin  300 mg Oral TID  . heparin  5,000 Units Subcutaneous Q8H  . lamoTRIgine  100 mg Oral BID  . nortriptyline  50 mg Oral QHS  . tamsulosin  0.4 mg Oral QHS   Continuous Infusions: . sodium chloride 50 mL/hr at 12/01/18 1157    Assessment/Plan:  1. Acute kidney injury.  Continue IV fluid hydration.  Creatinine came down from 2.47 down to 1.88.  Hopefully will be able to go home tomorrow. 2. Acute metabolic encephalopathy secondary to seizures.  Mental status back to baseline.  Case discussed with neurology and no changes in seizure medications at this time.  I will decrease dose of gabapentin with acute kidney injury.  Hopefully will go back up on the gabapentin dose tomorrow. 3. Alcoholic ketoacidosis.  Continue IV fluids. 4. Hyperlipidemia unspecified on atorvastatin 5. History of stroke and left-sided weakness.  Get physical therapy evaluation   Code Status:     Code Status Orders  (From admission, onward)         Start     Ordered   11/30/18 1708  Full code  Continuous     11/30/18 1707        Code Status History    This patient has a current code status but no historical code status.   Advance Care Planning Activity     Family Communication: Spoke with wife on the phone Disposition Plan: Hopefully will be able to go home tomorrow  Consultants:  Neurology  Time spent: 28 minutes  [place]

## 2018-12-01 NOTE — TOC Initial Note (Signed)
Transition of Care [facility]) - Initial/Assessment Note    Patient Details  Name: Daniel [name] MRN: [id number] Date of Birth: 1955/06/05  Transition of Care [facility]) CM/SW Contact:    , Daniel [name] Phone Number: [phone]  12/01/2018, 3:46 PM  Clinical Narrative: Clinical Social Worker (CSW) met with patient to discuss D/C plan. PT is recommending home health. Patient is alert and oriented X4 and was laying in the bed. CSW introduced self and explained role of CSW department. Per patient he lives in [place] with his wife Daniel [name] and his PCP is Dr. Ouida Sills [facility]. Per patient he has a walker at home. Patient has no DME needs. CSW explained home health and provided home health list from medicare.gov to patient. Per patient he does not have a preference of a home health agency. Per Daniel [name] home health agency representative they can accept patient. Patient is agreeable to [facility] home health. CSW will continue to follow and assist as needed.                   Expected Discharge Plan: [place] Barriers to Discharge: Continued Medical Work up   Patient Goals and CMS Choice Patient states their goals for this hospitalization and ongoing recovery are:: To feel better. CMS Medicare.gov Compare Post Acute Care list provided to:: Patient Choice offered to / list presented to : Patient  Expected Discharge Plan and Services Expected Discharge Plan: [place] In-house Referral: Clinical Social Work Discharge Planning Services: CM Consult Post Acute Care Choice: [place] arrangements for the past 2 months: Single Family Home Expected Discharge Date: 12/02/18                         HH Arranged: PT HH Agency: [facility] Date [place]: 12/01/18   Representative spoke with at [place]: [place] Arrangements/Services Living arrangements for the past 2 months: [place] with:: Spouse Patient language and need for interpreter reviewed:: No Do you feel safe going back to the place where you live?: Yes      Need for Family Participation in Patient Care: Yes (Comment) Care giver support system in place?: Yes (comment) Current home services: DME(Patient has walker at home.) Criminal Activity/Legal Involvement Pertinent to Current Situation/Hospitalization: No - Comment as needed  Activities of Daily Living Home Assistive Devices/Equipment: Cane (specify quad or straight) ADL Screening (condition at time of admission) Patient's cognitive ability adequate to safely complete daily activities?: Yes Is the patient deaf or have difficulty hearing?: Yes Does the patient have difficulty seeing, even when wearing glasses/contacts?: No Does the patient have difficulty concentrating, remembering, or making decisions?: No Patient able to express need for assistance with ADLs?: Yes Does the patient have difficulty dressing or bathing?: No Independently performs ADLs?: Yes (appropriate for developmental age) Does the patient have difficulty walking or climbing stairs?: No Weakness of Legs: None Weakness of Arms/Hands: Left  Permission Sought/Granted Permission sought to share information with : [facility] agency) Permission granted to share information with : Yes, Verbal Permission Granted              Emotional Assessment Appearance:: Appears stated age   Affect (typically observed): Pleasant, Calm Orientation: : Oriented to Self, Oriented to Place, Oriented to  Time, Oriented to Situation Alcohol / Substance Use: Not Applicable Psych Involvement: No (comment)  Admission diagnosis:  Alcoholic ketoacidosis [  E87.2] Seizure [facility]) [R56.9] Patient Active Problem List   Diagnosis Date Noted  . Acute encephalopathy 11/30/2018   PCP:  Kirk Ruths, MD Pharmacy:  No Pharmacies Listed    Social Determinants of Health (SDOH) Interventions     Readmission Risk Interventions No flowsheet data found.

## 2018-12-02 DIAGNOSIS — G9341 Metabolic encephalopathy: Secondary | ICD-10-CM | POA: Diagnosis not present

## 2018-12-02 DIAGNOSIS — R569 Unspecified convulsions: Secondary | ICD-10-CM | POA: Diagnosis not present

## 2018-12-02 DIAGNOSIS — N183 Chronic kidney disease, stage 3 (moderate): Secondary | ICD-10-CM | POA: Diagnosis not present

## 2018-12-02 DIAGNOSIS — E785 Hyperlipidemia, unspecified: Secondary | ICD-10-CM | POA: Diagnosis not present

## 2018-12-02 DIAGNOSIS — E872 Acidosis: Secondary | ICD-10-CM | POA: Diagnosis not present

## 2018-12-02 DIAGNOSIS — G40909 Epilepsy, unspecified, not intractable, without status epilepticus: Secondary | ICD-10-CM | POA: Diagnosis not present

## 2018-12-02 DIAGNOSIS — F10129 Alcohol abuse with intoxication, unspecified: Secondary | ICD-10-CM | POA: Diagnosis not present

## 2018-12-02 DIAGNOSIS — Z79899 Other long term (current) drug therapy: Secondary | ICD-10-CM | POA: Diagnosis not present

## 2018-12-02 DIAGNOSIS — Z1159 Encounter for screening for other viral diseases: Secondary | ICD-10-CM | POA: Diagnosis not present

## 2018-12-02 DIAGNOSIS — N179 Acute kidney failure, unspecified: Secondary | ICD-10-CM | POA: Diagnosis not present

## 2018-12-02 DIAGNOSIS — I69354 Hemiplegia and hemiparesis following cerebral infarction affecting left non-dominant side: Secondary | ICD-10-CM | POA: Diagnosis not present

## 2018-12-02 LAB — BASIC METABOLIC PANEL
Anion gap: 6 (ref 5–15)
BUN: 14 mg/dL (ref 8–23)
CO2: 25 mmol/L (ref 22–32)
Calcium: 8.2 mg/dL — ABNORMAL LOW (ref 8.9–10.3)
Chloride: 109 mmol/L (ref 98–111)
Creatinine, Ser: 1.87 mg/dL — ABNORMAL HIGH (ref 0.61–1.24)
GFR calc Af Amer: 43 mL/min — ABNORMAL LOW (ref >60)
GFR calc non Af Amer: 37 mL/min — ABNORMAL LOW (ref >60)
Glucose, Bld: 117 mg/dL — ABNORMAL HIGH (ref 70–99)
Potassium: 3.7 mmol/L (ref 3.5–5.1)
Sodium: 140 mmol/L (ref 135–145)

## 2018-12-02 LAB — NOVEL CORONAVIRUS, NAA (HOSP ORDER, SEND-OUT TO REF LAB; TAT 18-24 HRS): SARS-CoV-2, NAA: NOT DETECTED

## 2018-12-02 MED ORDER — GABAPENTIN 300 MG PO CAPS: 300.0000 mg | ORAL_CAPSULE | Freq: Three times a day (TID) | ORAL | 0 refills | Status: DC

## 2018-12-02 NOTE — TOC Transition Note (Signed)
Transition of Care [facility]) - CM/SW Discharge Note   Patient Details  Name: Daniel [name] MRN: [id number] Date of Birth: Dec 14, 1955  Transition of Care [facility]) CM/SW Contact:  Damichael Hofman, Lenice Llamas Phone Number: [phone]  12/02/2018, 8:54 AM   Clinical Narrative: Clinical Social Worker (CSW) notified Lauretta Chester home health representative that patient will D/C home today. Patient has no DME needs. Please reconsult if future social work needs arise. CSW signing off.       Final next level of care: [place] Barriers to Discharge: Barriers Resolved   Patient Goals and CMS Choice Patient states their goals for this hospitalization and ongoing recovery are:: To feel better. CMS Medicare.gov Compare Post Acute Care list provided to:: Patient Choice offered to / list presented to : Patient  Discharge Placement                       Discharge Plan and Services In-house Referral: Clinical Social Work Discharge Planning Services: CM Consult Post Acute Care Choice: Home Health                    HH Arranged: PT, OT, RN, Nurse's Aide [place] Agency: [facility] Date [facility] Agency Contacted: 12/02/18 Time [place]: [phone] Representative spoke with at [place]: [place] ([place]) Interventions     Readmission Risk Interventions No flowsheet data found.

## 2018-12-02 NOTE — Discharge Summary (Signed)
[place] NAME: Daniel [name]    MR#:  [id number]  DATE OF BIRTH:  26-Oct-1955  DATE OF ADMISSION:  11/30/2018 ADMITTING PHYSICIAN: Fritzi Mandes, MD  DATE OF DISCHARGE: 12/02/2018 11:18 AM  PRIMARY CARE PHYSICIAN: Kirk Ruths, MD    ADMISSION DIAGNOSIS:  Alcoholic ketoacidosis [G29.5] Seizure ([place]) [R56.9]  DISCHARGE DIAGNOSIS:  Acute kidney injury Seizure  SECONDARY DIAGNOSIS:   Past Medical History:  Diagnosis Date  . Seizures ([place])   . Stroke [facility]) 2016    HOSPITAL COURSE:   1.  Acute kidney injury, likely on chronic kidney disease stage III.  I do not have a baseline creatinine to go by.  Creatinine when he came in was up at 2.47 and on discharge 1.87. 2.  Acute metabolic encephalopathy secondary to seizures.  Case discussed with neurology and no changes in seizure medication at this time.  I did decrease the dose of gabapentin secondary to the patient's chronic kidney disease.  Continue Lamictal.  I advised that he must not drink alcohol. 3.  Alcoholic ketoacidosis.  Patient was given IV fluids.  I advised that he must stop alcohol.  Alcohol will lessen his threshold for another seizure. 4.  Hyperlipidemia unspecified on atorvastatin 5.  History of stroke and left-sided weakness.  Physical therapy recommended home with home health which I set up  DISCHARGE CONDITIONS:   Satisfactory  CONSULTS OBTAINED:  Treatment Team:  Alexis Goodell, MD  DRUG ALLERGIES:  No Known Allergies  DISCHARGE MEDICATIONS:   Allergies as of 12/02/2018   No Known Allergies     Medication List    STOP taking these medications   gabapentin 600 MG tablet Commonly known as: NEURONTIN Replaced by: gabapentin 300 MG capsule   lisinopril-hydrochlorothiazide 20-25 MG tablet Commonly known as: ZESTORETIC     TAKE these medications   atorvastatin 40 MG tablet Commonly known as: LIPITOR Take 40 mg by mouth daily.    DULoxetine 30 MG capsule Commonly known as: CYMBALTA Take 30 mg by mouth daily. Take with 60 mg   DULoxetine 60 MG capsule Commonly known as: CYMBALTA Take 60 mg by mouth daily. Takes with 30 mg   gabapentin 300 MG capsule Commonly known as: Neurontin Take 1 capsule (300 mg total) by mouth 3 (three) times daily. Replaces: gabapentin 600 MG tablet   lamoTRIgine 100 MG tablet Commonly known as: LAMICTAL Take 100 mg by mouth 2 (two) times a day.   nortriptyline 50 MG capsule Commonly known as: PAMELOR Take 50 mg by mouth at bedtime.   tamsulosin 0.4 MG Caps capsule Commonly known as: FLOMAX Take 0.4 mg by mouth at bedtime.        DISCHARGE INSTRUCTIONS:   Follow-up PMD 5 days  If you experience worsening of your admission symptoms, develop shortness of breath, life threatening emergency, suicidal or homicidal thoughts you must seek medical attention immediately by calling 911 or calling your MD immediately  if symptoms less severe.  You Must read complete instructions/literature along with all the possible adverse reactions/side effects for all the Medicines you take and that have been prescribed to you. Take any new Medicines after you have completely understood and accept all the possible adverse reactions/side effects.   Please note  You were cared for by a hospitalist during your hospital stay. If you have any questions about your discharge medications or the care you received while you were in the hospital after you are discharged, you  can call the unit and asked to speak with the hospitalist on call if the hospitalist that took care of you is not available. Once you are discharged, your primary care physician will handle any further medical issues. Please note that NO REFILLS for any discharge medications will be authorized once you are discharged, as it is imperative that you return to your primary care physician (or establish a relationship with a primary care physician  if you do not have one) for your aftercare needs so that they can reassess your need for medications and monitor your lab values.    Today   CHIEF COMPLAINT:   Chief Complaint  Patient presents with  . Seizures    HISTORY OF PRESENT ILLNESS:  Daniel [name]  is a 63 y.o. male presented with altered mental status and seizure   VITAL SIGNS:  Blood pressure 127/78, pulse 77, temperature 97.8 F (36.6 C), temperature source Oral, resp. rate 19, height 5\' 5"  (1.651 m), weight 101.2 kg, SpO2 98 %.    PHYSICAL EXAMINATION:  GENERAL:  63 y.o.-year-old patient lying in the bed with no acute distress.  EYES: Pupils equal, round, reactive to light and accommodation. No scleral icterus. Extraocular muscles intact.  HEENT: Head atraumatic, normocephalic. Oropharynx and nasopharynx clear.  NECK:  Supple, no jugular venous distention. No thyroid enlargement, no tenderness.  LUNGS: Normal breath sounds bilaterally, no wheezing, rales,rhonchi or crepitation. No use of accessory muscles of respiration.  CARDIOVASCULAR: S1, S2 normal. No murmurs, rubs, or gallops.  ABDOMEN: Soft, non-tender, non-distended. Bowel sounds present. No organomegaly or mass.  EXTREMITIES: No pedal edema, cyanosis, or clubbing.  NEUROLOGIC: Chronic left-sided weakness.  Left hand contracted and unable to extend his fingers.  Little more strength out of his left leg.  He is able to straight leg raise bilaterally. PSYCHIATRIC: The patient is alert and oriented x 3.  SKIN: No obvious rash, lesion, or ulcer.   DATA REVIEW:   CBC Recent Labs  Lab 11/30/18 1244  WBC 8.3  HGB 13.3  HCT 44.0  PLT 203    Chemistries  Recent Labs  Lab 11/30/18 1244  12/02/18 0508  NA 141   < > 140  K 3.6   < > 3.7  CL 107   < > 109  CO2 12*   < > 25  GLUCOSE 196*   < > 117*  BUN 26*   < > 14  CREATININE 2.47*   < > 1.87*  CALCIUM 9.3   < > 8.2*  AST 80*  --   --   ALT 81*  --   --   ALKPHOS 80  --   --   BILITOT 1.3*  --    --    < > = values in this interval not displayed.    Microbiology Results  Results for orders placed or performed during the hospital encounter of 11/30/18  Novel Coronavirus,NAA,(SEND-OUT TO REF LAB - TAT 24-48 hrs); Hosp Order     Status: None   Collection Time: 11/30/18  2:43 PM   Specimen: Nasopharyngeal Swab; Respiratory  Result Value Ref Range Status   SARS-CoV-2, NAA NOT DETECTED NOT DETECTED Final    Comment: (NOTE) This test was developed and its performance characteristics determined by [employer]. This test has not been FDA cleared or approved. This test has been authorized by FDA under an Emergency Use Authorization (EUA). This test is only authorized for the duration of time the declaration that circumstances exist justifying  the authorization of the emergency use of in vitro diagnostic tests for detection of SARS-CoV-2 virus and/or diagnosis of COVID-19 infection under section 564(b)(1) of the Act, 21 U.S.C. 358IPP-8(F)(8), unless the authorization is terminated or revoked sooner. When diagnostic testing is negative, the possibility of a false negative result should be considered in the context of a patient's recent exposures and the presence of clinical signs and symptoms consistent with COVID-19. An individual without symptoms of COVID-19 and who is not shedding SARS-CoV-2 virus would expect to have a negative (not detected) result in this assay. Performed  At: [facility] [place], [place] [id number] Rush Farmer MD VW:[id number]    [place]  Final    Comment: Performed at [facility], [address] [place], [place] [address]     Management plans discussed with the patient, and he is in agreement.  I spoke with the patient's wife yesterday afternoon about the plan.  I tried to reach her this morning but unable to do so.  CODE STATUS:  Code Status History    Date Active Date Inactive  Code Status Order ID Comments User Context   11/30/2018 1707 12/02/2018 1423 Full Code [id number]  Fritzi Mandes, MD Inpatient   Advance Care Planning Activity      TOTAL TIME TAKING CARE OF THIS PATIENT: 35 minutes.    Loletha Grayer M.D on 12/02/2018 at 4:30 PM  Between 7am to 6pm - Pager - [phone]  After 6pm go to [email or web address] - password [email or web address]  [employer] Office  [phone]  CC: Primary care physician; Kirk Ruths, MD

## 2018-12-02 NOTE — Discharge Instructions (Signed)
Seizure, Adult A seizure is a sudden burst of abnormal electrical activity in the brain. Seizures usually last from 30 seconds to 2 minutes. They can cause many different symptoms. Usually, seizures are not harmful unless they last a long time. What are the causes? Common causes of this condition include:  Fever or infection.  Conditions that affect the brain, such as: ? A brain abnormality that you were born with. ? A brain or head injury. ? Bleeding in the brain. ? A tumor. ? Stroke. ? Brain disorders such as autism or cerebral palsy.  Low blood sugar.  Conditions that are passed from parent to child (are inherited).  Problems with substances, such as: ? Having a reaction to a drug or a medicine. ? Suddenly stopping the use of a substance (withdrawal). In some cases, the cause may not be known. A person who has repeated seizures over time without a clear cause has a condition called epilepsy. What increases the risk? You are more likely to get this condition if you have:  A family history of epilepsy.  Had a seizure in the past.  A brain disorder.  A history of head injury, lack of oxygen at birth, or strokes. What are the signs or symptoms? There are many types of seizures. The symptoms vary depending on the type of seizure you have. Examples of symptoms during a seizure include:  Shaking (convulsions).  Stiffness in the body.  Passing out (losing consciousness).  Head nodding.  Staring.  Not responding to sound or touch.  Loss of bladder control and bowel control. Some people have symptoms right before and right after a seizure happens. Symptoms before a seizure may include:  Fear.  Worry (anxiety).  Feeling like you may vomit (nauseous).  Feeling like the room is spinning (vertigo).  Feeling like you saw or heard something before (dj vu).  Odd tastes or smells.  Changes in how you see. You may see flashing lights or spots. Symptoms after a  seizure happens can include:  Confusion.  Sleepiness.  Headache.  Weakness on one side of the body. How is this treated? Most seizures will stop on their own in under 5 minutes. In these cases, no treatment is needed. Seizures that last longer than 5 minutes will usually need treatment. Treatment can include:  Medicines given through an IV tube.  Avoiding things that are known to cause your seizures. These can include medicines that you take for another condition.  Medicines to treat epilepsy.  Surgery to stop the seizures. This may be needed if medicines do not help. Follow these instructions at home: Medicines  Take over-the-counter and prescription medicines only as told by your doctor.  Do not eat or drink anything that may keep your medicine from working, such as alcohol. Activity  Do not do any activities that would be dangerous if you had another seizure, like driving or swimming. Wait until your doctor says it is safe for you to do them.  If you live in the [place] ask your local DMV (department of motor vehicles) when you can drive.  Get plenty of rest. Teaching others Teach friends and family what to do when you have a seizure. They should:  Lay you on the ground.  Protect your head and body.  Loosen any tight clothing around your neck.  Turn you on your side.  Not hold you down.  Not put anything into your mouth.  Know whether or not you need emergency care.  Stay   with you until you are better.  General instructions  Contact your doctor each time you have a seizure.  Avoid anything that gives you seizures.  Keep a seizure diary. Write down: ? What you think caused each seizure. ? What you remember about each seizure.  Keep all follow-up visits as told by your doctor. This is important. Contact a doctor if:  You have another seizure.  You have seizures more often.  There is any change in what happens during your seizures.  You keep having  seizures with treatment.  You have symptoms of being sick or having an infection. Get help right away if:  You have a seizure that: ? Lasts longer than 5 minutes. ? Is different than seizures you had before. ? Makes it harder to breathe. ? Happens after you hurt your head.  You have any of these symptoms after a seizure: ? Not being able to speak. ? Not being able to use a part of your body. ? Confusion. ? A bad headache.  You have two or more seizures in a row.  You do not wake up right after a seizure.  You get hurt during a seizure. These symptoms may be an emergency. Do not wait to see if the symptoms will go away. Get medical help right away. Call your local emergency services (911 in the [place] Do not drive yourself to the hospital. Summary  Seizures usually last from 30 seconds to 2 minutes. Usually, they are not harmful unless they last a long time.  Do not eat or drink anything that may keep your medicine from working, such as alcohol.  Teach friends and family what to do when you have a seizure.  Contact your doctor each time you have a seizure. This information is not intended to replace advice given to you by your health care provider. Make sure you discuss any questions you have with your health care provider. Document Released: 10/31/2007 Document Revised: 08/01/2018 Document Reviewed: 08/01/2018 Elsevier Patient Education  2020 Elsevier Inc.  

## 2018-12-02 NOTE — Plan of Care (Signed)
Pt is d/ced home.  Back to baseline.  No seizures since admission.  Have been educating importance of taking his seizure medicine as ordered and not drinking.  Patient states he's depressed about going home.  States he doesn't get along with his wife.  Will review new medications and f/u appts.  Pt will have Home Health PT for strength and balance.  Wife will transport him home.

## 2018-12-03 ENCOUNTER — Encounter: Payer: Self-pay | Admitting: Occupational Therapy

## 2018-12-05 ENCOUNTER — Other Ambulatory Visit: Payer: Self-pay

## 2018-12-05 NOTE — Patient Outreach (Signed)
[place] [facility]) Care Management  12/05/2018  Daniel [name] 1956/02/25 [id number]    EMMI-General Discharge RED ON EMMI ALERT Day # 1 Date: 12/04/2018 Red Alert Reason: "Unfilled prescriptions? Yes"   Outreach attempt # 1 to patient. Spoke with patient who was hard of hearing and bad phone connection. He denies any acute issues or concerns at this time. Reviewed and addressed red alert with patient. He confirms that he has all his meds in the home at present. He denies any issues or concerns regarding them. He states that his spouse helps him keep track of meds. Patient asked which med he could take for "nerve pain" and RN CM reviewed with patient per med list meds that help with issue. He voiced understanding. He confirmed that he has MD appt on 12/08/2018 at 10:15am. No issues with transportation. [place] services in place and has been in contact with patient. No further RN CM needs or concerns at this time.     Plan: RN CM will close case as no further interventions needed at this time.   Enzo Montgomery, RN,BSN,CCM [place] Management Telephonic Care Management Coordinator Direct Phone: [phone] Toll Free: [phone] Fax: [phone]

## 2018-12-05 NOTE — TOC Progression Note (Addendum)
Transition of Care [facility]) - Progression Note    Patient Details  Name: Daniel [name] MRN: [id number] Date of Birth: December 25, 1955  Transition of Care [facility]) CM/SW Contact  Louisa Favaro, Lenice Llamas Phone Number: [phone]  12/05/2018, 9:44 AM  Clinical Narrative: 7/10: Clinical Social Worker ([place]) received a message from [place] home health representative stating that [facility] has been unable to get in touch with patient. CSW contacted [place] and left her a voicemail making her aware of above. A few minutes later [place] confirmed that she was able to get in touch with patient via telephone.     Expected Discharge Plan: [place] Barriers to Discharge: Barriers Resolved  Expected Discharge Plan and Services Expected Discharge Plan: [place] In-house Referral: Clinical Social Work Discharge Planning Services: CM Consult Post Acute Care Choice: [place] arrangements for the past 2 months: Single Family Home Expected Discharge Date: 12/02/18                         HH Arranged: PT, OT, RN, Nurse's Aide HH Agency: [facility] Date [place] Agency Contacted: 12/02/18 Time [place]: [phone] Representative spoke with at [place]: [place] ([place]) Interventions    Readmission Risk Interventions No flowsheet data found.

## 2018-12-06 DIAGNOSIS — I69354 Hemiplegia and hemiparesis following cerebral infarction affecting left non-dominant side: Secondary | ICD-10-CM | POA: Diagnosis not present

## 2018-12-06 DIAGNOSIS — G4089 Other seizures: Secondary | ICD-10-CM | POA: Diagnosis not present

## 2018-12-06 DIAGNOSIS — N189 Chronic kidney disease, unspecified: Secondary | ICD-10-CM | POA: Diagnosis not present

## 2018-12-06 DIAGNOSIS — E785 Hyperlipidemia, unspecified: Secondary | ICD-10-CM | POA: Diagnosis not present

## 2018-12-06 DIAGNOSIS — Z9181 History of falling: Secondary | ICD-10-CM | POA: Diagnosis not present

## 2018-12-06 DIAGNOSIS — G40409 Other generalized epilepsy and epileptic syndromes, not intractable, without status epilepticus: Secondary | ICD-10-CM | POA: Diagnosis not present

## 2018-12-08 DIAGNOSIS — R569 Unspecified convulsions: Secondary | ICD-10-CM | POA: Diagnosis not present

## 2018-12-08 DIAGNOSIS — I69354 Hemiplegia and hemiparesis following cerebral infarction affecting left non-dominant side: Secondary | ICD-10-CM | POA: Diagnosis not present

## 2018-12-08 DIAGNOSIS — N183 Chronic kidney disease, stage 3 (moderate): Secondary | ICD-10-CM | POA: Diagnosis not present

## 2018-12-08 DIAGNOSIS — F1020 Alcohol dependence, uncomplicated: Secondary | ICD-10-CM | POA: Diagnosis not present

## 2018-12-09 DIAGNOSIS — G4089 Other seizures: Secondary | ICD-10-CM | POA: Diagnosis not present

## 2018-12-09 DIAGNOSIS — E785 Hyperlipidemia, unspecified: Secondary | ICD-10-CM | POA: Diagnosis not present

## 2018-12-09 DIAGNOSIS — G40409 Other generalized epilepsy and epileptic syndromes, not intractable, without status epilepticus: Secondary | ICD-10-CM | POA: Diagnosis not present

## 2018-12-09 DIAGNOSIS — N189 Chronic kidney disease, unspecified: Secondary | ICD-10-CM | POA: Diagnosis not present

## 2018-12-09 DIAGNOSIS — I69354 Hemiplegia and hemiparesis following cerebral infarction affecting left non-dominant side: Secondary | ICD-10-CM | POA: Diagnosis not present

## 2018-12-09 DIAGNOSIS — Z9181 History of falling: Secondary | ICD-10-CM | POA: Diagnosis not present

## 2018-12-10 DIAGNOSIS — G40409 Other generalized epilepsy and epileptic syndromes, not intractable, without status epilepticus: Secondary | ICD-10-CM | POA: Diagnosis not present

## 2018-12-10 DIAGNOSIS — I69354 Hemiplegia and hemiparesis following cerebral infarction affecting left non-dominant side: Secondary | ICD-10-CM | POA: Diagnosis not present

## 2018-12-10 DIAGNOSIS — N189 Chronic kidney disease, unspecified: Secondary | ICD-10-CM | POA: Diagnosis not present

## 2018-12-10 DIAGNOSIS — Z9181 History of falling: Secondary | ICD-10-CM | POA: Diagnosis not present

## 2018-12-10 DIAGNOSIS — G4089 Other seizures: Secondary | ICD-10-CM | POA: Diagnosis not present

## 2018-12-10 DIAGNOSIS — E785 Hyperlipidemia, unspecified: Secondary | ICD-10-CM | POA: Diagnosis not present

## 2018-12-11 DIAGNOSIS — G4089 Other seizures: Secondary | ICD-10-CM | POA: Diagnosis not present

## 2018-12-11 DIAGNOSIS — E785 Hyperlipidemia, unspecified: Secondary | ICD-10-CM | POA: Diagnosis not present

## 2018-12-11 DIAGNOSIS — N189 Chronic kidney disease, unspecified: Secondary | ICD-10-CM | POA: Diagnosis not present

## 2018-12-11 DIAGNOSIS — I69354 Hemiplegia and hemiparesis following cerebral infarction affecting left non-dominant side: Secondary | ICD-10-CM | POA: Diagnosis not present

## 2018-12-11 DIAGNOSIS — G40409 Other generalized epilepsy and epileptic syndromes, not intractable, without status epilepticus: Secondary | ICD-10-CM | POA: Diagnosis not present

## 2018-12-11 DIAGNOSIS — Z9181 History of falling: Secondary | ICD-10-CM | POA: Diagnosis not present

## 2018-12-15 DIAGNOSIS — N189 Chronic kidney disease, unspecified: Secondary | ICD-10-CM | POA: Diagnosis not present

## 2018-12-15 DIAGNOSIS — I69354 Hemiplegia and hemiparesis following cerebral infarction affecting left non-dominant side: Secondary | ICD-10-CM | POA: Diagnosis not present

## 2018-12-15 DIAGNOSIS — E785 Hyperlipidemia, unspecified: Secondary | ICD-10-CM | POA: Diagnosis not present

## 2018-12-15 DIAGNOSIS — G4089 Other seizures: Secondary | ICD-10-CM | POA: Diagnosis not present

## 2018-12-15 DIAGNOSIS — G40409 Other generalized epilepsy and epileptic syndromes, not intractable, without status epilepticus: Secondary | ICD-10-CM | POA: Diagnosis not present

## 2018-12-15 DIAGNOSIS — Z9181 History of falling: Secondary | ICD-10-CM | POA: Diagnosis not present

## 2018-12-16 DIAGNOSIS — Z9181 History of falling: Secondary | ICD-10-CM | POA: Diagnosis not present

## 2018-12-16 DIAGNOSIS — G40409 Other generalized epilepsy and epileptic syndromes, not intractable, without status epilepticus: Secondary | ICD-10-CM | POA: Diagnosis not present

## 2018-12-16 DIAGNOSIS — E785 Hyperlipidemia, unspecified: Secondary | ICD-10-CM | POA: Diagnosis not present

## 2018-12-16 DIAGNOSIS — I69354 Hemiplegia and hemiparesis following cerebral infarction affecting left non-dominant side: Secondary | ICD-10-CM | POA: Diagnosis not present

## 2018-12-16 DIAGNOSIS — N189 Chronic kidney disease, unspecified: Secondary | ICD-10-CM | POA: Diagnosis not present

## 2018-12-16 DIAGNOSIS — G4089 Other seizures: Secondary | ICD-10-CM | POA: Diagnosis not present

## 2018-12-18 DIAGNOSIS — G4089 Other seizures: Secondary | ICD-10-CM | POA: Diagnosis not present

## 2018-12-18 DIAGNOSIS — I69354 Hemiplegia and hemiparesis following cerebral infarction affecting left non-dominant side: Secondary | ICD-10-CM | POA: Diagnosis not present

## 2018-12-18 DIAGNOSIS — E785 Hyperlipidemia, unspecified: Secondary | ICD-10-CM | POA: Diagnosis not present

## 2018-12-18 DIAGNOSIS — G40409 Other generalized epilepsy and epileptic syndromes, not intractable, without status epilepticus: Secondary | ICD-10-CM | POA: Diagnosis not present

## 2018-12-18 DIAGNOSIS — Z9181 History of falling: Secondary | ICD-10-CM | POA: Diagnosis not present

## 2018-12-18 DIAGNOSIS — N189 Chronic kidney disease, unspecified: Secondary | ICD-10-CM | POA: Diagnosis not present

## 2018-12-19 DIAGNOSIS — Z9181 History of falling: Secondary | ICD-10-CM | POA: Diagnosis not present

## 2018-12-19 DIAGNOSIS — I69354 Hemiplegia and hemiparesis following cerebral infarction affecting left non-dominant side: Secondary | ICD-10-CM | POA: Diagnosis not present

## 2018-12-19 DIAGNOSIS — G4089 Other seizures: Secondary | ICD-10-CM | POA: Diagnosis not present

## 2018-12-19 DIAGNOSIS — G40409 Other generalized epilepsy and epileptic syndromes, not intractable, without status epilepticus: Secondary | ICD-10-CM | POA: Diagnosis not present

## 2018-12-19 DIAGNOSIS — N189 Chronic kidney disease, unspecified: Secondary | ICD-10-CM | POA: Diagnosis not present

## 2018-12-19 DIAGNOSIS — E785 Hyperlipidemia, unspecified: Secondary | ICD-10-CM | POA: Diagnosis not present

## 2018-12-22 DIAGNOSIS — Z9181 History of falling: Secondary | ICD-10-CM | POA: Diagnosis not present

## 2018-12-22 DIAGNOSIS — I69354 Hemiplegia and hemiparesis following cerebral infarction affecting left non-dominant side: Secondary | ICD-10-CM | POA: Diagnosis not present

## 2018-12-22 DIAGNOSIS — G40409 Other generalized epilepsy and epileptic syndromes, not intractable, without status epilepticus: Secondary | ICD-10-CM | POA: Diagnosis not present

## 2018-12-22 DIAGNOSIS — E785 Hyperlipidemia, unspecified: Secondary | ICD-10-CM | POA: Diagnosis not present

## 2018-12-22 DIAGNOSIS — G4089 Other seizures: Secondary | ICD-10-CM | POA: Diagnosis not present

## 2018-12-22 DIAGNOSIS — N189 Chronic kidney disease, unspecified: Secondary | ICD-10-CM | POA: Diagnosis not present

## 2018-12-23 DIAGNOSIS — N189 Chronic kidney disease, unspecified: Secondary | ICD-10-CM | POA: Diagnosis not present

## 2018-12-23 DIAGNOSIS — I69354 Hemiplegia and hemiparesis following cerebral infarction affecting left non-dominant side: Secondary | ICD-10-CM | POA: Diagnosis not present

## 2018-12-23 DIAGNOSIS — Z9181 History of falling: Secondary | ICD-10-CM | POA: Diagnosis not present

## 2018-12-23 DIAGNOSIS — E785 Hyperlipidemia, unspecified: Secondary | ICD-10-CM | POA: Diagnosis not present

## 2018-12-23 DIAGNOSIS — G4089 Other seizures: Secondary | ICD-10-CM | POA: Diagnosis not present

## 2018-12-23 DIAGNOSIS — G40409 Other generalized epilepsy and epileptic syndromes, not intractable, without status epilepticus: Secondary | ICD-10-CM | POA: Diagnosis not present

## 2018-12-25 DIAGNOSIS — G40409 Other generalized epilepsy and epileptic syndromes, not intractable, without status epilepticus: Secondary | ICD-10-CM | POA: Diagnosis not present

## 2018-12-25 DIAGNOSIS — E785 Hyperlipidemia, unspecified: Secondary | ICD-10-CM | POA: Diagnosis not present

## 2018-12-25 DIAGNOSIS — Z9181 History of falling: Secondary | ICD-10-CM | POA: Diagnosis not present

## 2018-12-25 DIAGNOSIS — I69354 Hemiplegia and hemiparesis following cerebral infarction affecting left non-dominant side: Secondary | ICD-10-CM | POA: Diagnosis not present

## 2018-12-25 DIAGNOSIS — G4089 Other seizures: Secondary | ICD-10-CM | POA: Diagnosis not present

## 2018-12-25 DIAGNOSIS — N189 Chronic kidney disease, unspecified: Secondary | ICD-10-CM | POA: Diagnosis not present

## 2019-01-06 DIAGNOSIS — E1122 Type 2 diabetes mellitus with diabetic chronic kidney disease: Secondary | ICD-10-CM | POA: Diagnosis not present

## 2019-01-06 DIAGNOSIS — F0151 Vascular dementia with behavioral disturbance: Secondary | ICD-10-CM | POA: Diagnosis not present

## 2019-01-06 DIAGNOSIS — N183 Chronic kidney disease, stage 3 (moderate): Secondary | ICD-10-CM | POA: Diagnosis not present

## 2019-01-06 DIAGNOSIS — I10 Essential (primary) hypertension: Secondary | ICD-10-CM | POA: Diagnosis not present

## 2019-01-06 DIAGNOSIS — E7800 Pure hypercholesterolemia, unspecified: Secondary | ICD-10-CM | POA: Diagnosis not present

## 2019-01-13 DIAGNOSIS — N183 Chronic kidney disease, stage 3 (moderate): Secondary | ICD-10-CM | POA: Diagnosis not present

## 2019-01-13 DIAGNOSIS — Z125 Encounter for screening for malignant neoplasm of prostate: Secondary | ICD-10-CM | POA: Diagnosis not present

## 2019-01-13 DIAGNOSIS — R569 Unspecified convulsions: Secondary | ICD-10-CM | POA: Diagnosis not present

## 2019-01-13 DIAGNOSIS — F1020 Alcohol dependence, uncomplicated: Secondary | ICD-10-CM | POA: Diagnosis not present

## 2019-01-13 DIAGNOSIS — Z0000 Encounter for general adult medical examination without abnormal findings: Secondary | ICD-10-CM | POA: Diagnosis not present

## 2019-01-13 DIAGNOSIS — I69354 Hemiplegia and hemiparesis following cerebral infarction affecting left non-dominant side: Secondary | ICD-10-CM | POA: Diagnosis not present

## 2019-01-13 DIAGNOSIS — I129 Hypertensive chronic kidney disease with stage 1 through stage 4 chronic kidney disease, or unspecified chronic kidney disease: Secondary | ICD-10-CM | POA: Diagnosis not present

## 2019-01-13 DIAGNOSIS — E1122 Type 2 diabetes mellitus with diabetic chronic kidney disease: Secondary | ICD-10-CM | POA: Diagnosis not present

## 2019-01-13 DIAGNOSIS — F0151 Vascular dementia with behavioral disturbance: Secondary | ICD-10-CM | POA: Diagnosis not present

## 2019-02-04 ENCOUNTER — Other Ambulatory Visit: Payer: Self-pay

## 2019-02-04 ENCOUNTER — Encounter: Payer: Self-pay | Admitting: *Deleted

## 2019-02-04 ENCOUNTER — Encounter: Payer: Medicare HMO | Attending: Internal Medicine | Admitting: *Deleted

## 2019-02-04 VITALS — BP 100/60 | Ht 67.5 in | Wt 223.3 lb

## 2019-02-04 DIAGNOSIS — E1122 Type 2 diabetes mellitus with diabetic chronic kidney disease: Secondary | ICD-10-CM | POA: Diagnosis not present

## 2019-02-04 DIAGNOSIS — N183 Chronic kidney disease, stage 3 (moderate): Secondary | ICD-10-CM | POA: Insufficient documentation

## 2019-02-04 DIAGNOSIS — E119 Type 2 diabetes mellitus without complications: Secondary | ICD-10-CM

## 2019-02-04 NOTE — Patient Instructions (Addendum)
Check blood sugars 2 x day before breakfast and 2 hrs after supper - 3 times a week Bring blood sugar records to the next appointment  Call your doctor for a prescription for:  1. Meter strips (type) Accu-Chek Guide checking  3 times per week  2. Lancets (type) Accu-Chek Fastclix checking  3  times per week  Exercise: Continue walking for   30  minutes   7 days a week  Eat 3 meals day,  1-2  snacks a day Space meals 4-6 hours apart Avoid sugar sweetened drinks (soda, juices)  Make an eye doctor appointment  Return for appointment on:  Tuesday February 17, 2019 at 1:30 pm with Freda Munro (nurse)

## 2019-02-05 NOTE — Progress Notes (Signed)
Diabetes Self-Management Education  Visit Type: First/Initial  Appt. Start Time: 1350 Appt. End Time: [id number]  02/04/2019  [name], identified by name and date of birth, is a 63 y.o. male with a diagnosis of Diabetes: Type 2.   ASSESSMENT  Blood pressure 100/60, height 5' 7.5" (1.715 m), weight 223 lb 4.8 oz (101.3 kg). Body mass index is 34.46 kg/m.  Diabetes Self-Management Education - 02/04/19 1542      Visit Information   Visit Type  First/Initial      Initial Visit   Diabetes Type  Type 2    Are you currently following a meal plan?  No    Are you taking your medications as prescribed?  Yes    Date Diagnosed  1 month ago      Health Coping   How would you rate your overall health?  Fair      Psychosocial Assessment   Patient Belief/Attitude about Diabetes  Afraid   "like I am going to die"   Self-care barriers  None    Self-management support  Doctor's office;Family    Other persons present  Spouse/SO    Patient Concerns  Nutrition/Meal planning;Glycemic Control;Weight Control;Monitoring;Healthy Lifestyle    Special Needs  Instruct caregiver   past CVA; pt reports it takes time to process what people are saying especially with masks on"   Preferred Learning Style  Auditory;Visual    Learning Readiness  Ready    How often do you need to have someone help you when you read instructions, pamphlets, or other written materials from your doctor or pharmacy?  3 - Sometimes    What is the last grade level you completed in school?  9th      Pre-Education Assessment   Patient understands the diabetes disease and treatment process.  Needs Instruction    Patient understands incorporating nutritional management into lifestyle.  Needs Instruction    Patient undertands incorporating physical activity into lifestyle.  Needs Instruction    Patient understands using medications safely.  Needs Instruction    Patient understands monitoring blood glucose, interpreting and using  results  Needs Instruction    Patient understands prevention, detection, and treatment of acute complications.  Needs Instruction    Patient understands prevention, detection, and treatment of chronic complications.  Needs Instruction    Patient understands how to develop strategies to address psychosocial issues.  Needs Instruction    Patient understands how to develop strategies to promote health/change behavior.  Needs Instruction      Complications   Last HgB A1C per patient/outside source  7.5 %   01/06/2019   How often do you check your blood sugar? 0 times/day (not testing) Provided Accu-Chek Guide Me meter and instructed pt and wife on use. BG upon return demonstration was 150 mg/dL at 3:00 pm - 2 hrs pp.    Have you had a dilated eye exam in the past 12 months?  No    Have you had a dental exam in the past 12 months?  No    Are you checking your feet?  No      Dietary Intake   Breakfast  sausage, eggs, grits, milk; country fried steak biscuit    Lunch  ham and cheese sandwich and fruit    Snack (afternoon)  fruit (apples, pears, grapes, peach, bananas)    Dinner  chicken, fish, beef, pork, potatoes, bread, rice, pasta, corn, peas, beans, green beans, salads    Snack (evening)  fruit  Beverage(s)  water, fruit juice, soda      Exercise   Exercise Type  Light (walking / raking leaves)    How many days per week to you exercise?  7    How many minutes per day do you exercise?  30    Total minutes per week of exercise  210      Patient Education   Previous Diabetes Education  No    Disease state   Definition of diabetes, type 1 and 2, and the diagnosis of diabetes;Factors that contribute to the development of diabetes    Nutrition management   Role of diet in the treatment of diabetes and the relationship between the three main macronutrients and blood glucose level;Food label reading, portion sizes and measuring food.;Reviewed blood glucose goals for pre and post meals and how to  evaluate the patients' food intake on their blood glucose level.    Physical activity and exercise   Role of exercise on diabetes management, blood pressure control and cardiac health.    Medications  --    Monitoring  Taught/evaluated SMBG meter.;Purpose and frequency of SMBG.;Taught/discussed recording of test results and interpretation of SMBG.;Identified appropriate SMBG and/or A1C goals.    Chronic complications  Relationship between chronic complications and blood glucose control    Psychosocial adjustment  Identified and addressed patients feelings and concerns about diabetes      Individualized Goals (developed by patient)   Reducing Risk  Improve blood sugars Prevent diabetes complications Lose weight Lead a healthier lifestyle     Outcomes   Expected Outcomes  Demonstrated interest in learning. Expect positive outcomes    Future DMSE  2 wks       Individualized Plan for Diabetes Self-Management Training:   Learning Objective:  Patient will have a greater understanding of diabetes self-management. Patient education plan is to attend individual and/or group sessions per assessed needs and concerns.   Plan:   Patient Instructions  Check blood sugars 2 x day before breakfast and 2 hrs after supper - 3 times a week Bring blood sugar records to the next appointment Call your doctor for a prescription for:  1. Meter strips (type) Accu-Chek Guide checking  3 times per week  2. Lancets (type) Accu-Chek Fastclix checking  3  times per week Exercise: Continue walking for   30  minutes   7 days a week Eat 3 meals day,  1-2  snacks a day Space meals 4-6 hours apart Avoid sugar sweetened drinks (soda, juices) Make an eye doctor appointment Return for appointment on:  Tuesday February 17, 2019 at 1:30 pm with Daniel [name] (nurse)  Expected Outcomes:  Demonstrated interest in learning. Expect positive outcomes  Education material provided:  General Meal Planning Guidelines Simple Meal  Plan Meter = Accu-Chek Guide Me  If problems or questions, patient to contact team via:  Daniel [name], [place], [place], CDE [phone]  Future DSME appointment: 2 wks  The patient didn't want to attend classes but agreed to come to the  2 Hour Refresher Program. His next appointment is scheduled for February 17, 2019 with the nurse.

## 2019-02-17 ENCOUNTER — Encounter: Payer: Self-pay | Admitting: *Deleted

## 2019-02-17 ENCOUNTER — Encounter: Payer: Medicare HMO | Admitting: *Deleted

## 2019-02-17 ENCOUNTER — Other Ambulatory Visit: Payer: Self-pay

## 2019-02-17 VITALS — BP 100/70 | Wt 224.5 lb

## 2019-02-17 DIAGNOSIS — N183 Chronic kidney disease, stage 3 (moderate): Secondary | ICD-10-CM | POA: Diagnosis not present

## 2019-02-17 DIAGNOSIS — E119 Type 2 diabetes mellitus without complications: Secondary | ICD-10-CM

## 2019-02-17 DIAGNOSIS — E1122 Type 2 diabetes mellitus with diabetic chronic kidney disease: Secondary | ICD-10-CM | POA: Diagnosis not present

## 2019-02-17 NOTE — Progress Notes (Signed)
Diabetes Self-Management Education  Visit Type:  Follow-up  Appt. Start Time: 1320 Appt. End Time: [id number]  02/17/2019  [name], identified by name and date of birth, is a 63 y.o. male with a diagnosis of Diabetes: Type 2.   ASSESSMENT  Blood pressure 100/70, weight 224 lb 8 oz (101.8 kg). Body mass index is 34.64 kg/m.   Diabetes Self-Management Education - [id number]      Complications   How often do you check your blood sugar?  3-4 times / week    Fasting Blood glucose range (mg/dL)  130-179;>200   3 readings of 166, 155, 216 mg/dL. Pt also reports eating during the middle of the night sometimes.   Postprandial Blood glucose range (mg/dL)  70-129;130-179;180-200   3 pp's 190, 124 and 173 mg/dL.   Have you had a dilated eye exam in the past 12 months?  No    Have you had a dental exam in the past 12 months?  No    Are you checking your feet?  No      Dietary Intake   Breakfast  Pt reports eating 3 meals and multiple snacks    Beverage(s)  He reports that he has stopped drinking sugar sweetened drinks.      Exercise   Exercise Type  Light (walking / raking leaves)    How many days per week to you exercise?  7    How many minutes per day do you exercise?  45    Total minutes per week of exercise  315      Patient Education   Nutrition management   Food label reading, portion sizes and measuring food.;Carbohydrate counting;Reviewed blood glucose goals for pre and post meals and how to evaluate the patients' food intake on their blood glucose level.    Physical activity and exercise   Role of exercise on diabetes management, blood pressure control and cardiac health.    Monitoring  Purpose and frequency of SMBG.;Taught/discussed recording of test results and interpretation of SMBG.;Identified appropriate SMBG and/or A1C goals.;Daily foot exams;Yearly dilated eye exam    Chronic complications  Relationship between chronic complications and blood glucose  control;Identified and discussed with patient  current chronic complications      Individualized Goals (developed by patient)   Nutrition  Follow meal plan discussed    Physical Activity  30 minutes per day    Monitoring   test my blood glucose as discussed    Reducing Risk  do foot checks daily      Post-Education Assessment   Patient understands the diabetes disease and treatment process.  Demonstrates understanding / competency    Patient understands incorporating nutritional management into lifestyle.  Needs Review    Patient undertands incorporating physical activity into lifestyle.  Demonstrates understanding / competency    Patient understands using medications safely.  Needs Review    Patient understands monitoring blood glucose, interpreting and using results  Demonstrates understanding / competency    Patient understands prevention, detection, and treatment of acute complications.  Needs Review    Patient understands prevention, detection, and treatment of chronic complications.  Demonstrates understanding / competency    Patient understands how to develop strategies to address psychosocial issues.  Demonstrates understanding / competency    Patient understands how to develop strategies to promote health/change behavior.  Demonstrates understanding / competency      Outcomes   Program Status  Completed      Subsequent Visit  Since your last visit have you continued or begun to take your medications as prescribed?  Yes    Since your last visit have you had your blood pressure checked?  No    Since your last visit have you experienced any weight changes?  Gain    Weight Gain (lbs)  1    Since your last visit, are you checking your blood glucose at least once a day?  No   3 x week - ran out of strips and waiting for mail order pharmacy to send more      Learning Objective:  Patient will have a greater understanding of diabetes self-management. Patient education plan is to  attend individual and/or group sessions per assessed needs and concerns.   Plan:   Patient Instructions  Check blood sugars 2 x day before breakfast and 2 hrs after supper 3 x week Bring blood sugar records to the next MD appointment Continue walking daily for 30-60 minutes Eat 3 meals day,   1-2 snacks a day Space meals 4-6 hours apart Allow 2-3 hours between meals and snacks Include 1 serving of protein when having fruit as a snack Continue to avoid sugar sweetened drinks Make an eye doctor appointment Call for any questions  Expected Outcomes:  Demonstrated interest in learning. Expect positive outcomes  Education material provided:  Planning a Balanced Meal Using the Plate Method for a Balanced Meal ([employer]) How to Thrive: A Guide for Your Journey with Diabetes (ADA)  If problems or questions, patient to contact team via:  Daniel Drilling, RN, [place], CDE [phone]  Future DSME appointment: - PRN

## 2019-02-17 NOTE — Patient Instructions (Signed)
Check blood sugars 2 x day before breakfast and 2 hrs after supper 3 x week Bring blood sugar records to the next MD appointment  Continue walking daily for 30-60 minutes  Eat 3 meals day,   1-2 snacks a day Space meals 4-6 hours apart Allow 2-3 hours between meals and snacks Include 1 serving of protein when having fruit as a snack Continue to avoid sugar sweetened drinks  Make an eye doctor appointment  Call for any questions

## 2019-02-26 ENCOUNTER — Encounter: Payer: Self-pay | Admitting: Emergency Medicine

## 2019-02-26 ENCOUNTER — Emergency Department
Admission: EM | Admit: 2019-02-26 | Discharge: 2019-02-26 | Disposition: A | Discharge status: Home / Self Care | Payer: Medicare HMO | Attending: Emergency Medicine | Admitting: Emergency Medicine

## 2019-02-26 ENCOUNTER — Other Ambulatory Visit: Payer: Self-pay

## 2019-02-26 DIAGNOSIS — Z87898 Personal history of other specified conditions: Secondary | ICD-10-CM

## 2019-02-26 DIAGNOSIS — R569 Unspecified convulsions: Secondary | ICD-10-CM | POA: Diagnosis not present

## 2019-02-26 DIAGNOSIS — I129 Hypertensive chronic kidney disease with stage 1 through stage 4 chronic kidney disease, or unspecified chronic kidney disease: Secondary | ICD-10-CM | POA: Diagnosis not present

## 2019-02-26 DIAGNOSIS — G40909 Epilepsy, unspecified, not intractable, without status epilepticus: Secondary | ICD-10-CM | POA: Diagnosis not present

## 2019-02-26 DIAGNOSIS — Z7982 Long term (current) use of aspirin: Secondary | ICD-10-CM | POA: Insufficient documentation

## 2019-02-26 DIAGNOSIS — N189 Chronic kidney disease, unspecified: Secondary | ICD-10-CM | POA: Diagnosis not present

## 2019-02-26 DIAGNOSIS — E1122 Type 2 diabetes mellitus with diabetic chronic kidney disease: Secondary | ICD-10-CM | POA: Diagnosis not present

## 2019-02-26 DIAGNOSIS — Z87891 Personal history of nicotine dependence: Secondary | ICD-10-CM | POA: Insufficient documentation

## 2019-02-26 DIAGNOSIS — R0902 Hypoxemia: Secondary | ICD-10-CM | POA: Diagnosis not present

## 2019-02-26 DIAGNOSIS — Z79899 Other long term (current) drug therapy: Secondary | ICD-10-CM | POA: Insufficient documentation

## 2019-02-26 LAB — CBC WITH DIFFERENTIAL/PLATELET
Abs Immature Granulocytes: 0.01 10*3/uL (ref 0.00–0.07)
Basophils Absolute: 0.1 10*3/uL (ref 0.0–0.1)
Basophils Relative: 1 %
Eosinophils Absolute: 0.3 10*3/uL (ref 0.0–0.5)
Eosinophils Relative: 8 %
HCT: 40 % (ref 39.0–52.0)
Hemoglobin: 12.8 g/dL — ABNORMAL LOW (ref 13.0–17.0)
Immature Granulocytes: 0 %
Lymphocytes Relative: 34 %
Lymphs Abs: 1.5 10*3/uL (ref 0.7–4.0)
MCH: 26.7 pg (ref 26.0–34.0)
MCHC: 32 g/dL (ref 30.0–36.0)
MCV: 83.5 fL (ref 80.0–100.0)
Monocytes Absolute: 0.5 10*3/uL (ref 0.1–1.0)
Monocytes Relative: 10 %
Neutro Abs: 2.1 10*3/uL (ref 1.7–7.7)
Neutrophils Relative %: 47 %
Platelets: 167 10*3/uL (ref 150–400)
RBC: 4.79 MIL/uL (ref 4.22–5.81)
RDW: 13.7 % (ref 11.5–15.5)
WBC: 4.4 10*3/uL (ref 4.0–10.5)
nRBC: 0 % (ref 0.0–0.2)

## 2019-02-26 LAB — BASIC METABOLIC PANEL
Anion gap: 14 (ref 5–15)
BUN: 31 mg/dL — ABNORMAL HIGH (ref 8–23)
CO2: 24 mmol/L (ref 22–32)
Calcium: 9.7 mg/dL (ref 8.9–10.3)
Chloride: 99 mmol/L (ref 98–111)
Creatinine, Ser: 2.21 mg/dL — ABNORMAL HIGH (ref 0.61–1.24)
GFR calc Af Amer: 35 mL/min — ABNORMAL LOW (ref >60)
GFR calc non Af Amer: 31 mL/min — ABNORMAL LOW (ref >60)
Glucose, Bld: 131 mg/dL — ABNORMAL HIGH (ref 70–99)
Potassium: 4.2 mmol/L (ref 3.5–5.1)
Sodium: 137 mmol/L (ref 135–145)

## 2019-02-26 LAB — ETHANOL: Alcohol, Ethyl (B): <10 mg/dL (ref <10)

## 2019-02-26 MED ORDER — LAMOTRIGINE 100 MG PO TABS
100.0000 mg | ORAL_TABLET | Freq: Once | ORAL | Status: AC
  Administered 2019-02-26: 100 mg via ORAL
  Filled 2019-02-26: qty 1

## 2019-02-26 NOTE — ED Provider Notes (Signed)
[facility] Emergency Department Provider Note   ____________________________________________   First MD Initiated Contact with Patient 02/26/19 1940     (approximate)  I have reviewed the triage vital signs and the nursing notes.   HISTORY  Chief Complaint Seizures    HPI Daniel [name] is a 63 y.o. male with past medical history of hypertension, diabetes, seizures, and stroke who presents to the ED following seizure.  Patient states that he was on the commode when he began to feel like he was about to have a seizure.  He states he was able to lower himself to the ground, was then witnessed to have a generalized tonic-clonic seizure by his wife that lasted approximately 1 minute.  Upon EMS arrival, he was noted to be postictal, subsequently had gradual improvement to his mental status and is now awake and alert.  Patient reports he has been compliant with his Lamictal, took his morning dose but has not yet had his evening dose.  He states he has been feeling well recently with no fevers, chills, cough, chest pain, shortness of breath.  He denies any alcohol or drug abuse, states he stopped drinking after his admission in July.        Past Medical History:  Diagnosis Date  . Diabetes mellitus without complication ([place])   . Hypertension   . Pancreatitis   . Seizures ([place])   . Stroke ([place])   . Stroke [facility]) 2016    Patient Active Problem List   Diagnosis Date Noted  . Acute encephalopathy 11/30/2018  . Stroke ([place]) 10/27/2014  . Lymphadenitis 04/09/2011  . HTN (hypertension) 04/09/2011  . DM (diabetes mellitus) ([place]) 04/09/2011    Past Surgical History:  Procedure Laterality Date  . TEE WITHOUT CARDIOVERSION N/A 11/01/2014   Procedure: TRANSESOPHAGEAL ECHOCARDIOGRAM (TEE);  Surgeon: Wellington Hampshire, MD;  Location: [facility] ORS;  Service: Cardiovascular;  Laterality: N/A;    Prior to Admission medications   Medication Sig Start Date End Date  Taking? Authorizing Provider  aspirin 325 MG tablet Take 1 tablet (325 mg total) by mouth daily. Patient not taking: Reported on 06/10/2017 11/02/14   Henreitta Leber, MD  atorvastatin (LIPITOR) 40 MG tablet Take 40 mg by mouth daily. 08/17/18   [provider]  DULoxetine (CYMBALTA) 30 MG capsule Take 30 mg by mouth daily. Take with 60 mg 10/21/18   [provider]  DULoxetine (CYMBALTA) 60 MG capsule Take 60 mg by mouth daily. Takes with 30 mg 10/24/18   [provider]  gabapentin (NEURONTIN) 300 MG capsule Take 1 capsule (300 mg total) by mouth 3 (three) times daily. 12/02/18 12/02/19  Loletha Grayer, MD  lamoTRIgine (LAMICTAL) 100 MG tablet Take 100 mg by mouth 2 (two) times a day. 08/17/18   [provider]  lisinopril-hydrochlorothiazide (PRINZIDE,ZESTORETIC) 20-25 MG tablet Take 1 tablet by mouth daily.  06/06/17   [provider]  mirtazapine (REMERON) 7.5 MG tablet Take 7.5 mg by mouth at bedtime. 01/13/19 01/13/20  [provider]  nortriptyline (PAMELOR) 50 MG capsule Take 50 mg by mouth at bedtime.    [provider]  tamsulosin (FLOMAX) 0.4 MG CAPS capsule Take 1 capsule (0.4 mg total) by mouth daily. 07/24/17   Hollice Espy, MD    Allergies Patient has no known allergies.  Family History  Problem Relation Age of Onset  . CAD Sister   . Diabetes Sister   . Hypertension Sister   . Diabetes Sister   .  Prostate cancer Father   . Diabetes Mother   . Bladder Cancer Neg Hx   . Kidney cancer Neg Hx     Social History Social History   Tobacco Use  . Smoking status: Former Smoker    Packs/day: 0.50    Years: 45.00    Pack years: 22.50    Types: Cigarettes    Quit date: 10/28/2014    Years since quitting: 4.3  . Smokeless tobacco: Never Used  Substance Use Topics  . Alcohol use: Never    Frequency: Never  . Drug use: No    Review of Systems  Constitutional: No fever/chills Eyes: No visual changes. ENT: No sore  throat. Cardiovascular: Denies chest pain. Respiratory: Denies shortness of breath. Gastrointestinal: No abdominal pain.  No nausea, no vomiting.  No diarrhea.  No constipation. Genitourinary: Negative for dysuria. Musculoskeletal: Negative for back pain. Skin: Negative for rash. Neurological: Negative for headaches, focal weakness or numbness.  Positive for seizure.  ____________________________________________   PHYSICAL EXAM:  VITAL SIGNS: ED Triage Vitals  Enc Vitals Group     BP      Pulse      Resp      Temp      Temp src      SpO2      Weight      Height      Head Circumference      Peak Flow      Pain Score      Pain Loc      Pain Edu?      Excl. in [place]?     Constitutional: Alert and oriented. Eyes: Conjunctivae are normal. Head: Atraumatic. Nose: No congestion/rhinnorhea. Mouth/Throat: Mucous membranes are moist. Neck: Normal ROM Cardiovascular: Normal rate, regular rhythm. Grossly normal heart sounds. Respiratory: Normal respiratory effort.  No retractions. Lungs CTAB. Gastrointestinal: Soft and nontender. No distention. Genitourinary: deferred Musculoskeletal: No lower extremity tenderness nor edema. Neurologic:  Normal speech and language.  Residual left-sided weakness from prior stroke, otherwise no new focal deficits. Skin:  Skin is warm, dry and intact. No rash noted. Psychiatric: Mood and affect are normal. Speech and behavior are normal.  ____________________________________________   LABS (all labs ordered are listed, but only abnormal results are displayed)  Labs Reviewed  BASIC METABOLIC PANEL - Abnormal; Notable for the following components:      Result Value   Glucose, Bld 131 (*)    BUN 31 (*)    Creatinine, Ser 2.21 (*)    GFR calc non Af Amer 31 (*)    GFR calc Af Amer 35 (*)    All other components within normal limits  CBC WITH DIFFERENTIAL/PLATELET - Abnormal; Notable for the following components:   Hemoglobin 12.8 (*)    All  other components within normal limits  ETHANOL     PROCEDURES  Procedure(s) performed (including Critical Care):  Procedures   ____________________________________________   INITIAL IMPRESSION / ASSESSMENT AND PLAN / ED COURSE       63 year old male with history of seizures presents to the ED following witnessed tonic-clonic seizure lasting approximately 1 minute.  He has back to his baseline mental status upon arrival in the ED and denies any complaints.  He denies any head trauma and has no findings consistent with trauma on exam.  Will check labs and give his evening dose of Lamictal, continue to monitor for any recurrent seizure activity.  Labs unremarkable, patient remained seizure-free here in the ED with no complaints.  Counseled patient to follow-up with neurology for any potential medication changes and otherwise return to the ED for new or worsening symptoms.  Patient agrees with plan.      ____________________________________________   FINAL CLINICAL IMPRESSION(S) / ED DIAGNOSES  Final diagnoses:  Seizure ([place])  History of seizures  Chronic kidney disease, unspecified CKD stage     ED Discharge Orders    None       Note:  This document was prepared using Dragon voice recognition software and may include unintentional dictation errors.   Blake Divine, MD 02/26/19 [phone]

## 2019-02-26 NOTE — ED Triage Notes (Signed)
Pt arrived via EMS from home post witnessed seizure, lasting approx. 30 seconds. Pt with hx/o seizures, compliant with daily medications. Last seizure 3 months ago. Pt was post ictal on scene with EMS but on arrival to ED, pt is A&O x4, able to answer questions and follow commands appropriately. Pt had previous stroke with left sided deficit.

## 2019-03-02 DIAGNOSIS — E7800 Pure hypercholesterolemia, unspecified: Secondary | ICD-10-CM | POA: Diagnosis not present

## 2019-03-02 DIAGNOSIS — I69354 Hemiplegia and hemiparesis following cerebral infarction affecting left non-dominant side: Secondary | ICD-10-CM | POA: Diagnosis not present

## 2019-03-02 DIAGNOSIS — E1122 Type 2 diabetes mellitus with diabetic chronic kidney disease: Secondary | ICD-10-CM | POA: Diagnosis not present

## 2019-03-02 DIAGNOSIS — F0151 Vascular dementia with behavioral disturbance: Secondary | ICD-10-CM | POA: Diagnosis not present

## 2019-03-02 DIAGNOSIS — N1831 Chronic kidney disease, stage 3a: Secondary | ICD-10-CM | POA: Diagnosis not present

## 2019-03-02 DIAGNOSIS — N183 Chronic kidney disease, stage 3 unspecified: Secondary | ICD-10-CM | POA: Diagnosis not present

## 2019-03-02 DIAGNOSIS — F1020 Alcohol dependence, uncomplicated: Secondary | ICD-10-CM | POA: Diagnosis not present

## 2019-03-02 DIAGNOSIS — I129 Hypertensive chronic kidney disease with stage 1 through stage 4 chronic kidney disease, or unspecified chronic kidney disease: Secondary | ICD-10-CM | POA: Diagnosis not present

## 2019-03-02 DIAGNOSIS — R569 Unspecified convulsions: Secondary | ICD-10-CM | POA: Diagnosis not present

## 2019-03-18 DIAGNOSIS — N183 Chronic kidney disease, stage 3 unspecified: Secondary | ICD-10-CM | POA: Diagnosis not present

## 2019-03-18 DIAGNOSIS — E1122 Type 2 diabetes mellitus with diabetic chronic kidney disease: Secondary | ICD-10-CM | POA: Diagnosis not present

## 2019-03-18 DIAGNOSIS — Z23 Encounter for immunization: Secondary | ICD-10-CM | POA: Diagnosis not present

## 2019-03-18 DIAGNOSIS — F1020 Alcohol dependence, uncomplicated: Secondary | ICD-10-CM | POA: Diagnosis not present

## 2019-03-18 DIAGNOSIS — R569 Unspecified convulsions: Secondary | ICD-10-CM | POA: Diagnosis not present

## 2019-03-18 DIAGNOSIS — F0151 Vascular dementia with behavioral disturbance: Secondary | ICD-10-CM | POA: Diagnosis not present

## 2019-03-18 DIAGNOSIS — I129 Hypertensive chronic kidney disease with stage 1 through stage 4 chronic kidney disease, or unspecified chronic kidney disease: Secondary | ICD-10-CM | POA: Diagnosis not present

## 2019-03-18 DIAGNOSIS — N1832 Chronic kidney disease, stage 3b: Secondary | ICD-10-CM | POA: Diagnosis not present

## 2019-03-18 DIAGNOSIS — I69354 Hemiplegia and hemiparesis following cerebral infarction affecting left non-dominant side: Secondary | ICD-10-CM | POA: Diagnosis not present

## 2019-04-07 DIAGNOSIS — I69354 Hemiplegia and hemiparesis following cerebral infarction affecting left non-dominant side: Secondary | ICD-10-CM | POA: Diagnosis not present

## 2019-04-07 DIAGNOSIS — I129 Hypertensive chronic kidney disease with stage 1 through stage 4 chronic kidney disease, or unspecified chronic kidney disease: Secondary | ICD-10-CM | POA: Diagnosis not present

## 2019-04-07 DIAGNOSIS — F0151 Vascular dementia with behavioral disturbance: Secondary | ICD-10-CM | POA: Diagnosis not present

## 2019-04-07 DIAGNOSIS — E1122 Type 2 diabetes mellitus with diabetic chronic kidney disease: Secondary | ICD-10-CM | POA: Diagnosis not present

## 2019-04-07 DIAGNOSIS — Z125 Encounter for screening for malignant neoplasm of prostate: Secondary | ICD-10-CM | POA: Diagnosis not present

## 2019-04-07 DIAGNOSIS — N1832 Chronic kidney disease, stage 3b: Secondary | ICD-10-CM | POA: Diagnosis not present

## 2019-05-08 DIAGNOSIS — I63511 Cerebral infarction due to unspecified occlusion or stenosis of right middle cerebral artery: Secondary | ICD-10-CM | POA: Diagnosis not present

## 2019-05-08 DIAGNOSIS — F0151 Vascular dementia with behavioral disturbance: Secondary | ICD-10-CM | POA: Diagnosis not present

## 2019-05-08 DIAGNOSIS — Z79899 Other long term (current) drug therapy: Secondary | ICD-10-CM | POA: Diagnosis not present

## 2019-05-08 DIAGNOSIS — R569 Unspecified convulsions: Secondary | ICD-10-CM | POA: Diagnosis not present

## 2019-05-18 DIAGNOSIS — K922 Gastrointestinal hemorrhage, unspecified: Secondary | ICD-10-CM | POA: Diagnosis not present

## 2019-05-18 DIAGNOSIS — Z87891 Personal history of nicotine dependence: Secondary | ICD-10-CM | POA: Diagnosis not present

## 2019-06-03 DIAGNOSIS — N1832 Chronic kidney disease, stage 3b: Secondary | ICD-10-CM | POA: Diagnosis not present

## 2019-06-03 DIAGNOSIS — E1122 Type 2 diabetes mellitus with diabetic chronic kidney disease: Secondary | ICD-10-CM | POA: Diagnosis not present

## 2019-06-03 DIAGNOSIS — I69354 Hemiplegia and hemiparesis following cerebral infarction affecting left non-dominant side: Secondary | ICD-10-CM | POA: Diagnosis not present

## 2019-06-03 DIAGNOSIS — K625 Hemorrhage of anus and rectum: Secondary | ICD-10-CM | POA: Diagnosis not present

## 2019-06-03 DIAGNOSIS — F0151 Vascular dementia with behavioral disturbance: Secondary | ICD-10-CM | POA: Diagnosis not present

## 2019-06-03 DIAGNOSIS — K219 Gastro-esophageal reflux disease without esophagitis: Secondary | ICD-10-CM | POA: Diagnosis not present

## 2019-06-03 DIAGNOSIS — R1312 Dysphagia, oropharyngeal phase: Secondary | ICD-10-CM | POA: Diagnosis not present

## 2019-06-03 DIAGNOSIS — N183 Chronic kidney disease, stage 3 unspecified: Secondary | ICD-10-CM | POA: Diagnosis not present

## 2019-06-12 ENCOUNTER — Other Ambulatory Visit: Payer: Self-pay

## 2019-06-12 ENCOUNTER — Inpatient Hospital Stay
Admission: EM | Admit: 2019-06-12 | Discharge: 2019-06-16 | DRG: 637 | Disposition: A | Discharge status: Home Health | Payer: Medicare HMO | Attending: Internal Medicine | Admitting: Internal Medicine

## 2019-06-12 DIAGNOSIS — Z8249 Family history of ischemic heart disease and other diseases of the circulatory system: Secondary | ICD-10-CM | POA: Diagnosis not present

## 2019-06-12 DIAGNOSIS — E860 Dehydration: Secondary | ICD-10-CM | POA: Diagnosis not present

## 2019-06-12 DIAGNOSIS — N289 Disorder of kidney and ureter, unspecified: Secondary | ICD-10-CM | POA: Diagnosis not present

## 2019-06-12 DIAGNOSIS — N400 Enlarged prostate without lower urinary tract symptoms: Secondary | ICD-10-CM | POA: Diagnosis present

## 2019-06-12 DIAGNOSIS — E869 Volume depletion, unspecified: Secondary | ICD-10-CM | POA: Diagnosis present

## 2019-06-12 DIAGNOSIS — D631 Anemia in chronic kidney disease: Secondary | ICD-10-CM | POA: Diagnosis present

## 2019-06-12 DIAGNOSIS — E1122 Type 2 diabetes mellitus with diabetic chronic kidney disease: Secondary | ICD-10-CM | POA: Diagnosis present

## 2019-06-12 DIAGNOSIS — E1110 Type 2 diabetes mellitus with ketoacidosis without coma: Secondary | ICD-10-CM | POA: Diagnosis not present

## 2019-06-12 DIAGNOSIS — G9340 Encephalopathy, unspecified: Secondary | ICD-10-CM | POA: Diagnosis not present

## 2019-06-12 DIAGNOSIS — I10 Essential (primary) hypertension: Secondary | ICD-10-CM | POA: Diagnosis not present

## 2019-06-12 DIAGNOSIS — N179 Acute kidney failure, unspecified: Secondary | ICD-10-CM | POA: Diagnosis not present

## 2019-06-12 DIAGNOSIS — R000 Tachycardia, unspecified: Secondary | ICD-10-CM | POA: Diagnosis not present

## 2019-06-12 DIAGNOSIS — R739 Hyperglycemia, unspecified: Secondary | ICD-10-CM

## 2019-06-12 DIAGNOSIS — E871 Hypo-osmolality and hyponatremia: Secondary | ICD-10-CM | POA: Diagnosis present

## 2019-06-12 DIAGNOSIS — Z8673 Personal history of transient ischemic attack (TIA), and cerebral infarction without residual deficits: Secondary | ICD-10-CM

## 2019-06-12 DIAGNOSIS — F329 Major depressive disorder, single episode, unspecified: Secondary | ICD-10-CM | POA: Diagnosis present

## 2019-06-12 DIAGNOSIS — Z20822 Contact with and (suspected) exposure to covid-19: Secondary | ICD-10-CM | POA: Diagnosis present

## 2019-06-12 DIAGNOSIS — IMO0002 Reserved for concepts with insufficient information to code with codable children: Secondary | ICD-10-CM | POA: Diagnosis present

## 2019-06-12 DIAGNOSIS — G9341 Metabolic encephalopathy: Secondary | ICD-10-CM | POA: Diagnosis not present

## 2019-06-12 DIAGNOSIS — G40909 Epilepsy, unspecified, not intractable, without status epilepticus: Secondary | ICD-10-CM | POA: Diagnosis not present

## 2019-06-12 DIAGNOSIS — G459 Transient cerebral ischemic attack, unspecified: Secondary | ICD-10-CM | POA: Diagnosis not present

## 2019-06-12 DIAGNOSIS — E118 Type 2 diabetes mellitus with unspecified complications: Secondary | ICD-10-CM | POA: Diagnosis not present

## 2019-06-12 DIAGNOSIS — N1832 Chronic kidney disease, stage 3b: Secondary | ICD-10-CM | POA: Diagnosis not present

## 2019-06-12 DIAGNOSIS — E1142 Type 2 diabetes mellitus with diabetic polyneuropathy: Secondary | ICD-10-CM | POA: Diagnosis present

## 2019-06-12 DIAGNOSIS — E785 Hyperlipidemia, unspecified: Secondary | ICD-10-CM

## 2019-06-12 DIAGNOSIS — E1100 Type 2 diabetes mellitus with hyperosmolarity without nonketotic hyperglycemic-hyperosmolar coma (NKHHC): Secondary | ICD-10-CM | POA: Diagnosis not present

## 2019-06-12 DIAGNOSIS — E669 Obesity, unspecified: Secondary | ICD-10-CM | POA: Diagnosis not present

## 2019-06-12 DIAGNOSIS — Z833 Family history of diabetes mellitus: Secondary | ICD-10-CM | POA: Diagnosis not present

## 2019-06-12 DIAGNOSIS — E875 Hyperkalemia: Secondary | ICD-10-CM | POA: Diagnosis not present

## 2019-06-12 DIAGNOSIS — Z6834 Body mass index (BMI) 34.0-34.9, adult: Secondary | ICD-10-CM | POA: Diagnosis not present

## 2019-06-12 DIAGNOSIS — E1165 Type 2 diabetes mellitus with hyperglycemia: Secondary | ICD-10-CM | POA: Diagnosis present

## 2019-06-12 DIAGNOSIS — F32A Depression, unspecified: Secondary | ICD-10-CM | POA: Diagnosis present

## 2019-06-12 DIAGNOSIS — I129 Hypertensive chronic kidney disease with stage 1 through stage 4 chronic kidney disease, or unspecified chronic kidney disease: Secondary | ICD-10-CM | POA: Diagnosis present

## 2019-06-12 DIAGNOSIS — G629 Polyneuropathy, unspecified: Secondary | ICD-10-CM

## 2019-06-12 DIAGNOSIS — Z79899 Other long term (current) drug therapy: Secondary | ICD-10-CM | POA: Diagnosis not present

## 2019-06-12 LAB — GLUCOSE, CAPILLARY
Glucose-Capillary: >600 mg/dL (ref 70–99)
Glucose-Capillary: >600 mg/dL (ref 70–99)
Glucose-Capillary: >600 mg/dL (ref 70–99)
Glucose-Capillary: >600 mg/dL (ref 70–99)
Glucose-Capillary: >600 mg/dL (ref 70–99)
Glucose-Capillary: 558 mg/dL (ref 70–99)
Glucose-Capillary: 597 mg/dL (ref 70–99)
Glucose-Capillary: 474 mg/dL — ABNORMAL HIGH (ref 70–99)
Glucose-Capillary: 282 mg/dL — ABNORMAL HIGH (ref 70–99)
Glucose-Capillary: 73 mg/dL (ref 70–99)
Glucose-Capillary: 64 mg/dL — ABNORMAL LOW (ref 70–99)
Glucose-Capillary: 83 mg/dL (ref 70–99)

## 2019-06-12 LAB — CBC
HCT: 39.1 % (ref 39.0–52.0)
Hemoglobin: 12.8 g/dL — ABNORMAL LOW (ref 13.0–17.0)
MCH: 26.6 pg (ref 26.0–34.0)
MCHC: 32.7 g/dL (ref 30.0–36.0)
MCV: 81.1 fL (ref 80.0–100.0)
Platelets: 221 10*3/uL (ref 150–400)
RBC: 4.82 MIL/uL (ref 4.22–5.81)
RDW: 13.1 % (ref 11.5–15.5)
WBC: 5.9 10*3/uL (ref 4.0–10.5)
nRBC: 0 % (ref 0.0–0.2)

## 2019-06-12 LAB — BASIC METABOLIC PANEL
Anion gap: 17 — ABNORMAL HIGH (ref 5–15)
Anion gap: 12 (ref 5–15)
Anion gap: 13 (ref 5–15)
BUN: 94 mg/dL — ABNORMAL HIGH (ref 8–23)
BUN: 89 mg/dL — ABNORMAL HIGH (ref 8–23)
BUN: 78 mg/dL — ABNORMAL HIGH (ref 8–23)
CO2: 20 mmol/L — ABNORMAL LOW (ref 22–32)
CO2: 23 mmol/L (ref 22–32)
CO2: 22 mmol/L (ref 22–32)
Calcium: 9 mg/dL (ref 8.9–10.3)
Calcium: 8.8 mg/dL — ABNORMAL LOW (ref 8.9–10.3)
Calcium: 8.8 mg/dL — ABNORMAL LOW (ref 8.9–10.3)
Chloride: 85 mmol/L — ABNORMAL LOW (ref 98–111)
Chloride: 96 mmol/L — ABNORMAL LOW (ref 98–111)
Chloride: 108 mmol/L (ref 98–111)
Creatinine, Ser: 4.7 mg/dL — ABNORMAL HIGH (ref 0.61–1.24)
Creatinine, Ser: 4.24 mg/dL — ABNORMAL HIGH (ref 0.61–1.24)
Creatinine, Ser: 3.36 mg/dL — ABNORMAL HIGH (ref 0.61–1.24)
GFR calc Af Amer: 14 mL/min — ABNORMAL LOW (ref >60)
GFR calc Af Amer: 16 mL/min — ABNORMAL LOW (ref >60)
GFR calc Af Amer: 21 mL/min — ABNORMAL LOW (ref >60)
GFR calc non Af Amer: 12 mL/min — ABNORMAL LOW (ref >60)
GFR calc non Af Amer: 14 mL/min — ABNORMAL LOW (ref >60)
GFR calc non Af Amer: 18 mL/min — ABNORMAL LOW (ref >60)
Glucose, Bld: 1016 mg/dL (ref 70–99)
Glucose, Bld: 758 mg/dL (ref 70–99)
Glucose, Bld: 126 mg/dL — ABNORMAL HIGH (ref 70–99)
Potassium: 6 mmol/L — ABNORMAL HIGH (ref 3.5–5.1)
Potassium: 4.5 mmol/L (ref 3.5–5.1)
Potassium: 3.9 mmol/L (ref 3.5–5.1)
Sodium: 122 mmol/L — ABNORMAL LOW (ref 135–145)
Sodium: 131 mmol/L — ABNORMAL LOW (ref 135–145)
Sodium: 143 mmol/L (ref 135–145)

## 2019-06-12 LAB — MAGNESIUM: Magnesium: 3 mg/dL — ABNORMAL HIGH (ref 1.7–2.4)

## 2019-06-12 LAB — BETA-HYDROXYBUTYRIC ACID
Beta-Hydroxybutyric Acid: 1.09 mmol/L — ABNORMAL HIGH (ref 0.05–0.27)
Beta-Hydroxybutyric Acid: 0.11 mmol/L (ref 0.05–0.27)

## 2019-06-12 LAB — RESPIRATORY PANEL BY RT PCR (FLU A&B, COVID)
Influenza A by PCR: NEGATIVE
Influenza B by PCR: NEGATIVE
SARS Coronavirus 2 by RT PCR: NEGATIVE

## 2019-06-12 MED ORDER — SODIUM CHLORIDE 0.9 % IV BOLUS
1000.0000 mL | INTRAVENOUS | Status: AC
  Administered 2019-06-12: 1000 mL via INTRAVENOUS

## 2019-06-12 MED ORDER — DEXTROSE 50 % IV SOLN: 0.0000 mL | INTRAVENOUS | Status: DC | PRN

## 2019-06-12 MED ORDER — NORTRIPTYLINE HCL 25 MG PO CAPS
50.0000 mg | ORAL_CAPSULE | Freq: Every day | ORAL | Status: DC
  Administered 2019-06-12 – 2019-06-15 (×4): 50 mg via ORAL
  Filled 2019-06-12 (×7): qty 2

## 2019-06-12 MED ORDER — DULOXETINE HCL 30 MG PO CPEP
60.0000 mg | ORAL_CAPSULE | Freq: Every day | ORAL | Status: DC
  Administered 2019-06-13 – 2019-06-16 (×4): 60 mg via ORAL
  Filled 2019-06-12: qty 1
  Filled 2019-06-12 (×4): qty 2

## 2019-06-12 MED ORDER — LACTATED RINGERS BOLUS PEDS: 1000.0000 mL | Freq: Once | INTRAVENOUS | Status: DC

## 2019-06-12 MED ORDER — LAMOTRIGINE 25 MG PO TABS
150.0000 mg | ORAL_TABLET | Freq: Two times a day (BID) | ORAL | Status: DC
  Administered 2019-06-12 – 2019-06-16 (×8): 150 mg via ORAL
  Filled 2019-06-12 (×8): qty 1

## 2019-06-12 MED ORDER — DEXTROSE-NACL 5-0.45 % IV SOLN: INTRAVENOUS | Status: DC

## 2019-06-12 MED ORDER — INSULIN REGULAR(HUMAN) IN NACL 100-0.9 UT/100ML-% IV SOLN: INTRAVENOUS | Status: DC

## 2019-06-12 MED ORDER — DULOXETINE HCL 30 MG PO CPEP
30.0000 mg | ORAL_CAPSULE | Freq: Every day | ORAL | Status: DC
  Administered 2019-06-13 – 2019-06-16 (×4): 30 mg via ORAL
  Filled 2019-06-12 (×5): qty 1

## 2019-06-12 MED ORDER — INSULIN REGULAR(HUMAN) IN NACL 100-0.9 UT/100ML-% IV SOLN
INTRAVENOUS | Status: DC
  Administered 2019-06-12: 11.5 [IU]/h via INTRAVENOUS
  Administered 2019-06-13: 1 [IU]/h via INTRAVENOUS
  Filled 2019-06-12 (×2): qty 100

## 2019-06-12 MED ORDER — HEPARIN SODIUM (PORCINE) 5000 UNIT/ML IJ SOLN
5000.0000 [IU] | Freq: Three times a day (TID) | INTRAMUSCULAR | Status: DC
  Administered 2019-06-12 – 2019-06-16 (×11): 5000 [IU] via SUBCUTANEOUS
  Filled 2019-06-12 (×12): qty 1

## 2019-06-12 MED ORDER — ONDANSETRON HCL 4 MG/2ML IJ SOLN
4.0000 mg | Freq: Four times a day (QID) | INTRAMUSCULAR | Status: DC | PRN
  Administered 2019-06-14 (×2): 4 mg via INTRAVENOUS
  Filled 2019-06-12 (×2): qty 2

## 2019-06-12 MED ORDER — ATORVASTATIN CALCIUM 20 MG PO TABS
40.0000 mg | ORAL_TABLET | Freq: Every day | ORAL | Status: DC
  Administered 2019-06-12 – 2019-06-15 (×4): 40 mg via ORAL
  Filled 2019-06-12 (×4): qty 2

## 2019-06-12 MED ORDER — SODIUM CHLORIDE 0.9 % IV SOLN: INTRAVENOUS | Status: DC

## 2019-06-12 MED ORDER — LACTATED RINGERS IV BOLUS
1000.0000 mL | Freq: Once | INTRAVENOUS | Status: AC
  Administered 2019-06-12: 1000 mL via INTRAVENOUS

## 2019-06-12 MED ORDER — SODIUM CHLORIDE 0.9 % IV SOLN: Freq: Once | INTRAVENOUS | Status: AC

## 2019-06-12 MED ORDER — GABAPENTIN 300 MG PO CAPS
300.0000 mg | ORAL_CAPSULE | Freq: Three times a day (TID) | ORAL | Status: DC
  Administered 2019-06-12 – 2019-06-16 (×11): 300 mg via ORAL
  Filled 2019-06-12 (×11): qty 1

## 2019-06-12 MED ORDER — TAMSULOSIN HCL 0.4 MG PO CAPS
0.4000 mg | ORAL_CAPSULE | Freq: Every day | ORAL | Status: DC
  Administered 2019-06-13 – 2019-06-16 (×4): 0.4 mg via ORAL
  Filled 2019-06-12 (×4): qty 1

## 2019-06-12 NOTE — ED Notes (Signed)
Sharion Settler NP notified of BP systolic [id number]. LR bolus ordered

## 2019-06-12 NOTE — ED Notes (Signed)
Pt changed and peri care provided 

## 2019-06-12 NOTE — ED Notes (Signed)
Sharion Settler NP notified of pt [id number] and of insulin stopped. Ordered to start d5 1/2 NS maintenance

## 2019-06-12 NOTE — ED Triage Notes (Addendum)
Pt comes via [facility] from home with c/o hyperglycemia. Pt states he just aches all over. Pt states he checked his sugar at home and then called 911. Pt states he takes a pill for his sugar but was hurting so bad he forgot to take it.  Current CBG is reading HI at this time. Pt has IV in place by EMS.  Pt states pain in side. Pt denies any N/V/D. Pt states he is thirsty all the time.

## 2019-06-12 NOTE — ED Notes (Signed)
Pt BP 84/59, pt lethargic. MD Sreenath notified, to place orders for bolus

## 2019-06-12 NOTE — H&P (Addendum)
History and Physical    Daniel [name] [id number] DOB: April 07, 1956 DOA: 06/12/2019  PCP: Kirk Ruths, MD  Patient coming from: Home  I have personally briefly reviewed patient's old medical records in [place]  Chief Complaint: Weakness and elevated blood glucose  HPI: Daniel [name] is a 64 y.o. male with medical history significant of poorly controlled diabetes, hypertension, seizure disorder, depression who presents from home after notable onset of lethargy.  The patient apparently has diabetes controlled on oral medications however oral diabetic medications were notably absent from his home admission medication reconciliation.  Unclear exactly what he takes.  On my arrival the patient is lethargic and does wake up however is unable to provide any cogent history.  As such majority the history is gained from speaking with the ED physician as well reviewing patient documentation.  Apparently the patient has been feeling very weak over the last several days however today could not get out of bed due to weakness.  He checked his blood sugar at home and read as "high".  Apparently the patient states that he has been taking his diabetic medication but did not take today.  ED Course: On initial presentation patient was found to be hemodynamically stable however had multiple metabolic derangements consistent with diabetic ketoacidosis.  He had hyponatremia, hyperkalemia, acute kidney injury on chronic kidney disease, and a blood glucose greater than 1000.  Hemogram overall within normal limits.  Venous blood gas requested by the ED physician and is currently pending at time of this note.  Patient was given multiple boluses of crystalloid and started on insulin infusion per Endo tool protocol.  COVID-19 test was drawn and is pending at time of this note and hospitalist was called for inpatient admission.  Review of Systems: Unable to provide due to mental status  Past Medical  History:  Diagnosis Date  . Diabetes mellitus without complication ([place])   . Hypertension   . Pancreatitis   . Seizures ([place])   . Stroke ([place])   . Stroke [facility]) 2016    Past Surgical History:  Procedure Laterality Date  . TEE WITHOUT CARDIOVERSION N/A 11/01/2014   Procedure: TRANSESOPHAGEAL ECHOCARDIOGRAM (TEE);  Surgeon: Wellington Hampshire, MD;  Location: [facility] ORS;  Service: Cardiovascular;  Laterality: N/A;     reports that he quit smoking about 4 years ago. His smoking use included cigarettes. He has a 22.50 pack-year smoking history. He has never used smokeless tobacco. He reports that he does not drink alcohol or use drugs.  No Known Allergies  Family History  Problem Relation Age of Onset  . CAD Sister   . Diabetes Sister   . Hypertension Sister   . Diabetes Sister   . Prostate cancer Father   . Diabetes Mother   . Bladder Cancer Neg Hx   . Kidney cancer Neg Hx     Prior to Admission medications   Medication Sig Start Date End Date Taking? Authorizing Provider  atorvastatin (LIPITOR) 40 MG tablet Take 40 mg by mouth daily. 08/17/18  Yes [provider]  DULoxetine (CYMBALTA) 30 MG capsule Take 30 mg by mouth daily. Take with 60 mg 10/21/18  Yes [provider]  DULoxetine (CYMBALTA) 60 MG capsule Take 60 mg by mouth daily. Takes with 30 mg 10/24/18  Yes [provider]  gabapentin (NEURONTIN) 300 MG capsule Take 1 capsule (300 mg total) by mouth 3 (three) times daily. 12/02/18 12/02/19 Yes Loletha Grayer, MD  lamoTRIgine (LAMICTAL) 150  MG tablet Take 150 mg by mouth 2 (two) times a day.  08/17/18  Yes [provider]  lisinopril-hydrochlorothiazide (PRINZIDE,ZESTORETIC) 20-25 MG tablet Take 1 tablet by mouth daily.  06/06/17  Yes [provider]  nortriptyline (PAMELOR) 50 MG capsule Take 50 mg by mouth at bedtime.   Yes [provider]  tamsulosin (FLOMAX) 0.4 MG CAPS capsule Take 1 capsule (0.4 mg total) by mouth daily.  07/24/17  Yes Hollice Espy, MD  hydrocortisone (ANUSOL-HC) 2.5 % rectal cream Place 1 application rectally as directed. 05/18/19   [provider]    Physical Exam: Vitals:   06/12/19 1228 06/12/19 1430  BP: 100/66 112/78  Pulse: 100 92  Resp: 19 12  Temp: 98 F (36.7 C)   SpO2: 97% 93%  Weight: 95.3 kg   Height: 5\' 6"  (1.676 m)     Constitutional: NAD, calm, comfortable Vitals:   06/12/19 1228 06/12/19 1430  BP: 100/66 112/78  Pulse: 100 92  Resp: 19 12  Temp: 98 F (36.7 C)   SpO2: 97% 93%  Weight: 95.3 kg   Height: 5\' 6"  (1.676 m)    Eyes: PERRL, lids and conjunctivae normal ENMT: Mucous membranes are dry. Posterior pharynx clear of any exudate or lesions.poor dentition.  Neck: normal, supple, no masses, no thyromegaly Respiratory: Slightly labored respiration Cardiovascular: Regular rate and rhythm, no murmurs / rubs / gallops. No extremity edema. 2+ pedal pulses. No carotid bruits.  Abdomen: no tenderness, no masses palpated. No hepatosplenomegaly. Bowel sounds positive.  Musculoskeletal: no clubbing / cyanosis. No joint deformity upper and lower extremities. Good ROM, no contractures. Normal muscle tone.  Skin: no rashes, lesions, ulcers. No induration Neurologic: CN 2-12 grossly intact. Sensation intact,  Psychiatric: Lethargic unable to assess    Labs on Admission: I have personally reviewed following labs and imaging studies  CBC: Recent Labs  Lab 06/12/19 1230  WBC 5.9  HGB 12.8*  HCT 39.1  MCV 81.1  PLT [id number]   Basic Metabolic Panel: Recent Labs  Lab 06/12/19 1230  NA 122*  K 6.0*  CL 85*  CO2 20*  GLUCOSE 1,016*  BUN 94*  CREATININE 4.70*  CALCIUM 9.0   GFR: Estimated Creatinine Clearance: 17.4 mL/min (A) (by C-G formula based on SCr of 4.7 mg/dL (H)). Liver Function Tests: No results for input(s): AST, ALT, ALKPHOS, BILITOT, PROT, ALBUMIN in the last 168 hours. No results for input(s): LIPASE, AMYLASE in the last 168  hours. No results for input(s): AMMONIA in the last 168 hours. Coagulation Profile: No results for input(s): INR, PROTIME in the last 168 hours. Cardiac Enzymes: No results for input(s): CKTOTAL, CKMB, CKMBINDEX, TROPONINI in the last 168 hours. BNP (last 3 results) No results for input(s): PROBNP in the last 8760 hours. HbA1C: No results for input(s): HGBA1C in the last 72 hours. CBG: Recent Labs  Lab 06/12/19 1226 06/12/19 1440 06/12/19 1527  GLUCAP >600* >600* >600*   Lipid Profile: No results for input(s): CHOL, HDL, LDLCALC, TRIG, CHOLHDL, LDLDIRECT in the last 72 hours. Thyroid Function Tests: No results for input(s): TSH, T4TOTAL, FREET4, T3FREE, THYROIDAB in the last 72 hours. Anemia Panel: No results for input(s): VITAMINB12, FOLATE, FERRITIN, TIBC, IRON, RETICCTPCT in the last 72 hours. Urine analysis:    Component Value Date/Time   COLORURINE Yellow 10/04/2013 1949   APPEARANCEUR Clear 10/04/2013 1949   LABSPEC 1.057 10/04/2013 1949   PHURINE 5.0 10/04/2013 1949   GLUCOSEU Negative 10/04/2013 1949   HGBUR Negative 10/04/2013 1949  BILIRUBINUR Negative 10/04/2013 1949   KETONESUR Negative 10/04/2013 1949   PROTEINUR Negative 10/04/2013 1949   NITRITE Negative 10/04/2013 1949   LEUKOCYTESUR Negative 10/04/2013 1949    Radiological Exams on Admission: No results found.  EKG: Ordered, pending  Assessment/Plan Active Problems:   DKA (diabetic ketoacidoses) (HCC)    Diabetic ketoacidosis Hyponatremia Hyperkalemia Metabolic acidosis Patient states he is on oral medications however none are present on the admission medication rec Unclear exactly what this patient actually takes Labs consistent with acute diabetic ketoacidosis Metabolic derangements likely secondary to acidosis and volume depletion Patient was given fluid bolus resuscitation in the emergency department Plan: Admit to stepdown unit Insulin GTT per Endo tool protocol Normal saline at 75  cc/h after bolus Switch to dextrose containing fluids once sugar below 250 Every 4 hours BMP N.p.o. for now, advance diet slowly Check hemoglobin A1c Zofran as needed nausea Diabetes coordinator consult Dietitian consult  Acute kidney injury on chronic kidney disease Baseline creatinine appears to be around 2 Creatinine on presentation 4.7 Suspect element of prerenal azotemia in the setting of likely diabetic nephropathy Recheck kidney function in a.m. Consider nephrology consult during hospitalization if creatinine not improving  Hypertension Hold home lisinopril/hydrochlorothiazide for now Restart as appropriate  Seizure disorder Continue home omeprazole  Hyperlipidemia Tinea home atorvastatin  Depression/ anxiety Continue home duloxetine Continue home nortriptyline   DVT prophylaxis: Heparin Code Status: Full Family Communication: None Disposition Plan: Home, when DKA resolved Consults called: None Admission status: Inpatient, stepdown unit   Sidney Ace MD [facility] Pager [phone]  If 7PM-7AM, please contact night-coverage [email or web address] Password [email or web address]  06/12/2019, 3:40 PM

## 2019-06-12 NOTE — ED Provider Notes (Addendum)
[facility] Emergency Department Provider Note  Time seen: 2:27 PM  I have reviewed the triage vital signs and the nursing notes.   HISTORY  Chief Complaint Hyperglycemia   HPI Daniel [name] is a 64 y.o. male with a past medical history of diabetes, hypertension, seizures, presents to the emergency department for generalized weakness and elevated blood glucose.  According to the patient for the past several days he has been feeling very weak today could not get out of bed due to weakness.  Checked his blood sugar and it read "high."  Patient currently is awake but somnolent.  Does answer questions but then falls back asleep.  Patient states he only takes oral diabetic medications does not take insulin.  States he has been taking it but forgot today.  Denies any fever cough congestion or shortness of breath.  Past Medical History:  Diagnosis Date  . Diabetes mellitus without complication ([place])   . Hypertension   . Pancreatitis   . Seizures ([place])   . Stroke ([place])   . Stroke [facility]) 2016    Patient Active Problem List   Diagnosis Date Noted  . Acute encephalopathy 11/30/2018  . Stroke ([place]) 10/27/2014  . Lymphadenitis 04/09/2011  . HTN (hypertension) 04/09/2011  . DM (diabetes mellitus) ([place]) 04/09/2011    Past Surgical History:  Procedure Laterality Date  . TEE WITHOUT CARDIOVERSION N/A 11/01/2014   Procedure: TRANSESOPHAGEAL ECHOCARDIOGRAM (TEE);  Surgeon: Wellington Hampshire, MD;  Location: [facility] ORS;  Service: Cardiovascular;  Laterality: N/A;    Prior to Admission medications   Medication Sig Start Date End Date Taking? Authorizing Provider  aspirin 325 MG tablet Take 1 tablet (325 mg total) by mouth daily. Patient not taking: Reported on 06/10/2017 11/02/14   Henreitta Leber, MD  atorvastatin (LIPITOR) 40 MG tablet Take 40 mg by mouth daily. 08/17/18   [provider]  DULoxetine (CYMBALTA) 30 MG capsule Take 30 mg by mouth daily. Take with 60  mg 10/21/18   [provider]  DULoxetine (CYMBALTA) 60 MG capsule Take 60 mg by mouth daily. Takes with 30 mg 10/24/18   [provider]  gabapentin (NEURONTIN) 300 MG capsule Take 1 capsule (300 mg total) by mouth 3 (three) times daily. 12/02/18 12/02/19  Loletha Grayer, MD  lamoTRIgine (LAMICTAL) 100 MG tablet Take 100 mg by mouth 2 (two) times a day. 08/17/18   [provider]  lisinopril-hydrochlorothiazide (PRINZIDE,ZESTORETIC) 20-25 MG tablet Take 1 tablet by mouth daily.  06/06/17   [provider]  mirtazapine (REMERON) 7.5 MG tablet Take 7.5 mg by mouth at bedtime. 01/13/19 01/13/20  [provider]  nortriptyline (PAMELOR) 50 MG capsule Take 50 mg by mouth at bedtime.    [provider]  tamsulosin (FLOMAX) 0.4 MG CAPS capsule Take 1 capsule (0.4 mg total) by mouth daily. 07/24/17   Hollice Espy, MD    No Known Allergies  Family History  Problem Relation Age of Onset  . CAD Sister   . Diabetes Sister   . Hypertension Sister   . Diabetes Sister   . Prostate cancer Father   . Diabetes Mother   . Bladder Cancer Neg Hx   . Kidney cancer Neg Hx     Social History Social History   Tobacco Use  . Smoking status: Former Smoker    Packs/day: 0.50    Years: 45.00    Pack years: 22.50    Types: Cigarettes    Quit date: 10/28/2014  Years since quitting: 4.6  . Smokeless tobacco: Never Used  Substance Use Topics  . Alcohol use: Never  . Drug use: No    Review of Systems Constitutional: Negative for fever.  Positive for generalized fatigue/weakness. Cardiovascular: Negative for chest pain. Respiratory: Negative for shortness of breath. Gastrointestinal: Negative for abdominal pain, vomiting  Genitourinary: Negative for urinary compaints Musculoskeletal: Negative for musculoskeletal complaints Neurological: Negative for headache All other ROS negative  ____________________________________________   PHYSICAL  EXAM:  VITAL SIGNS: ED Triage Vitals [06/12/19 1228]  Enc Vitals Group     BP 100/66     Pulse Rate 100     Resp 19     Temp 98 F (36.7 C)     Temp src      SpO2 97 %     Weight 210 lb (95.3 kg)     Height 5\' 6"  (1.676 m)     Head Circumference      Peak Flow      Pain Score 5     Pain Loc      Pain Edu?      Excl. in [place]?     Constitutional: Patient is alert and oriented but somnolent.  Does awaken and answer questions but then falls back asleep. Eyes: Normal exam ENT      Head: Normocephalic and atraumatic.      Mouth/Throat: Mucous membranes are moist. Cardiovascular: Normal rate, regular rhythm around 100 bpm. Respiratory: Normal respiratory effort without tachypnea nor retractions. Breath sounds are clear  Gastrointestinal: Soft and nontender. No distention.  Musculoskeletal: Nontender with normal range of motion in all extremities.  Neurologic:  Normal speech and language. No gross focal neurologic deficits  Skin:  Skin is warm, dry and intact.  Psychiatric: Mood and affect are normal.   ____________________________________________  INITIAL IMPRESSION / ASSESSMENT AND PLAN / ED COURSE  Pertinent labs & imaging results that were available during my care of the patient were reviewed by me and considered in my medical decision making (see chart for details).   Patient presents to the emergency department for generalized fatigue weakness hyperglycemia.  Patient's chemistry shows a glucose of 1016 with a creatinine of 4.7 up from a baseline around 2.0 consistent with acute renal insufficiency.  Patient had an anion gap of 17 consistent with HHS versus DKA.  We will obtain a VBG.  We will IV hydrate we will start the patient on insulin infusion.  We will check for Covid.  Patient will require admission to the hospital service for further work-up.  Patient's VBG shows a pH of 7.23 with a bicarb of 22.      Daniel [name] was evaluated in Emergency Department on  06/12/2019 for the symptoms described in the history of present illness. He was evaluated in the context of the global COVID-19 pandemic, which necessitated consideration that the patient might be at risk for infection with the SARS-CoV-2 virus that causes COVID-19. Institutional protocols and algorithms that pertain to the evaluation of patients at risk for COVID-19 are in a state of rapid change based on information released by regulatory bodies including the CDC and federal and state organizations. These policies and algorithms were followed during the patient's care in the ED.  CRITICAL CARE Performed by: Harvest Dark   Total critical care time: 30 minutes  Critical care time was exclusive of separately billable procedures and treating other patients.  Critical care was necessary to treat or prevent imminent or life-threatening deterioration.  Critical care was time spent personally by me on the following activities: development of treatment plan with patient and/or surrogate as well as nursing, discussions with consultants, evaluation of patient's response to treatment, examination of patient, obtaining history from patient or surrogate, ordering and performing treatments and interventions, ordering and review of laboratory studies, ordering and review of radiographic studies, pulse oximetry and re-evaluation of patient's condition.  ____________________________________________   FINAL CLINICAL IMPRESSION(S) / ED DIAGNOSES  Hyperglycemia Dehydration Renal failure   Harvest Dark, MD 06/12/19 1430    Harvest Dark, MD 06/12/19 1530

## 2019-06-13 DIAGNOSIS — N179 Acute kidney failure, unspecified: Secondary | ICD-10-CM

## 2019-06-13 DIAGNOSIS — G629 Polyneuropathy, unspecified: Secondary | ICD-10-CM

## 2019-06-13 DIAGNOSIS — E875 Hyperkalemia: Secondary | ICD-10-CM | POA: Diagnosis present

## 2019-06-13 DIAGNOSIS — F329 Major depressive disorder, single episode, unspecified: Secondary | ICD-10-CM | POA: Diagnosis present

## 2019-06-13 DIAGNOSIS — E669 Obesity, unspecified: Secondary | ICD-10-CM

## 2019-06-13 DIAGNOSIS — N400 Enlarged prostate without lower urinary tract symptoms: Secondary | ICD-10-CM | POA: Diagnosis present

## 2019-06-13 DIAGNOSIS — G9340 Encephalopathy, unspecified: Secondary | ICD-10-CM

## 2019-06-13 DIAGNOSIS — F32A Depression, unspecified: Secondary | ICD-10-CM | POA: Diagnosis present

## 2019-06-13 LAB — BASIC METABOLIC PANEL
Anion gap: 9 (ref 5–15)
BUN: 71 mg/dL — ABNORMAL HIGH (ref 8–23)
CO2: 24 mmol/L (ref 22–32)
Calcium: 8.7 mg/dL — ABNORMAL LOW (ref 8.9–10.3)
Chloride: 105 mmol/L (ref 98–111)
Creatinine, Ser: 2.9 mg/dL — ABNORMAL HIGH (ref 0.61–1.24)
GFR calc Af Amer: 26 mL/min — ABNORMAL LOW (ref >60)
GFR calc non Af Amer: 22 mL/min — ABNORMAL LOW (ref >60)
Glucose, Bld: 206 mg/dL — ABNORMAL HIGH (ref 70–99)
Potassium: 4.9 mmol/L (ref 3.5–5.1)
Sodium: 138 mmol/L (ref 135–145)

## 2019-06-13 LAB — URINALYSIS, COMPLETE (UACMP) WITH MICROSCOPIC
Bilirubin Urine: NEGATIVE
Glucose, UA: 150 mg/dL — AB
Ketones, ur: NEGATIVE mg/dL
Nitrite: NEGATIVE
Protein, ur: NEGATIVE mg/dL
Specific Gravity, Urine: 1.017 (ref 1.005–1.030)
pH: 5 (ref 5.0–8.0)

## 2019-06-13 LAB — GLUCOSE, CAPILLARY
Glucose-Capillary: 129 mg/dL — ABNORMAL HIGH (ref 70–99)
Glucose-Capillary: 155 mg/dL — ABNORMAL HIGH (ref 70–99)
Glucose-Capillary: 161 mg/dL — ABNORMAL HIGH (ref 70–99)
Glucose-Capillary: 180 mg/dL — ABNORMAL HIGH (ref 70–99)
Glucose-Capillary: 181 mg/dL — ABNORMAL HIGH (ref 70–99)
Glucose-Capillary: 194 mg/dL — ABNORMAL HIGH (ref 70–99)
Glucose-Capillary: 204 mg/dL — ABNORMAL HIGH (ref 70–99)
Glucose-Capillary: 277 mg/dL — ABNORMAL HIGH (ref 70–99)
Glucose-Capillary: 296 mg/dL — ABNORMAL HIGH (ref 70–99)

## 2019-06-13 LAB — HEMOGLOBIN A1C
Hgb A1c MFr Bld: 14.9 % — ABNORMAL HIGH (ref 4.8–5.6)
Mean Plasma Glucose: 380.93 mg/dL

## 2019-06-13 LAB — HIV ANTIBODY (ROUTINE TESTING W REFLEX): HIV Screen 4th Generation wRfx: NONREACTIVE

## 2019-06-13 MED ORDER — ALUM & MAG HYDROXIDE-SIMETH 200-200-20 MG/5ML PO SUSP
30.0000 mL | Freq: Four times a day (QID) | ORAL | Status: DC | PRN
  Administered 2019-06-14: 30 mL via ORAL
  Filled 2019-06-13: qty 30

## 2019-06-13 MED ORDER — INSULIN GLARGINE 100 UNIT/ML ~~LOC~~ SOLN
15.0000 [IU] | Freq: Every day | SUBCUTANEOUS | Status: DC
  Filled 2019-06-13 (×3): qty 0.15

## 2019-06-13 MED ORDER — INSULIN ASPART 100 UNIT/ML ~~LOC~~ SOLN
0.0000 [IU] | Freq: Three times a day (TID) | SUBCUTANEOUS | Status: DC
  Administered 2019-06-13: 5 [IU] via SUBCUTANEOUS
  Administered 2019-06-13 (×2): 8 [IU] via SUBCUTANEOUS
  Administered 2019-06-13: 3 [IU] via SUBCUTANEOUS
  Administered 2019-06-14: 5 [IU] via SUBCUTANEOUS
  Administered 2019-06-14: 8 [IU] via SUBCUTANEOUS
  Administered 2019-06-14 – 2019-06-15 (×2): 5 [IU] via SUBCUTANEOUS
  Administered 2019-06-15: 2 [IU] via SUBCUTANEOUS
  Administered 2019-06-15: 3 [IU] via SUBCUTANEOUS
  Administered 2019-06-15: 11 [IU] via SUBCUTANEOUS
  Administered 2019-06-16: 5 [IU] via SUBCUTANEOUS
  Administered 2019-06-16: 2 [IU] via SUBCUTANEOUS
  Filled 2019-06-13 (×12): qty 1

## 2019-06-13 MED ORDER — INSULIN ASPART 100 UNIT/ML ~~LOC~~ SOLN
4.0000 [IU] | Freq: Three times a day (TID) | SUBCUTANEOUS | Status: DC
  Administered 2019-06-13 – 2019-06-16 (×10): 4 [IU] via SUBCUTANEOUS
  Filled 2019-06-13 (×7): qty 1

## 2019-06-13 MED ORDER — SODIUM CHLORIDE 0.9 % IV SOLN: Freq: Once | INTRAVENOUS | Status: DC

## 2019-06-13 MED ORDER — SODIUM CHLORIDE 0.9 % IV SOLN: INTRAVENOUS | Status: DC

## 2019-06-13 NOTE — ED Notes (Signed)
Pt cleaned of stool and urine

## 2019-06-13 NOTE — Progress Notes (Signed)
Patient win with DKA and acute renal failure.  Anion gap now closed and endotool indicating need to stop IV insulin therapy.  HGB A1C notably above 14. Unknown home regimen and previous note likely showing patient non compliant with treatment.  She has been started on short acting  scheduled mealtime and SSI for meals, and lantus bed time dose with approx TDD at 38 unts based on weight. Don adjusted meal time dose to reduce risk of hypoglycemia. Without need for iv insulin and renal function steadily improving with IV fluid therapy, she can transfer to medsurg bed

## 2019-06-13 NOTE — ED Notes (Signed)
Lab notified of need for Beta-hydroxy/BMP scheduled for 0738. IVs do not draw back. Lab had to collect previous BMP as well per nursing notes.

## 2019-06-13 NOTE — ED Notes (Signed)
Lab called for BMP draw 

## 2019-06-13 NOTE — ED Notes (Signed)
Pt changed and peri care provided. [place] changed.  Call bell within reach. Stretcher locked in lowest position. Lights dimmed

## 2019-06-13 NOTE — ED Notes (Signed)
endotool orderset changed to hyperglycemic order set, ok per Randol Kern NP

## 2019-06-13 NOTE — Progress Notes (Signed)
PROGRESS NOTE  Daniel [name] [id number] DOB: [birth year] DOA: 06/12/2019 PCP: Kirk Ruths, MD  HPI/Recap of past 67 hours: 64 year old male with past medical history of poorly controlled diabetes (although there are no oral medications listed on his home med list), hypertension, reported seizure disorder, and obesity presented to the emergency room on 1/15 with lethargy.  T he reportedly emergency room had been feeling weak for the past few days and could not get out of bed and when he checked his blood sugar it sat high.  The emergency room, he was found to be in DKA with blood sugar of almost thousand and an anion gap of 17 as well as acute kidney injury and hyperkalemia.  Patient was started on insulin drip and IV fluids.  He was admitted to the hospitalist service  By this morning, anion gap had corrected and CBGs were stable.  Patient was able to be weaned off insulin drip.  His A1c came back markedly elevated at 14.9.  Patient self is still somewhat somnolent, but this may be more from being up for most of the night.  His creatinine still elevated, but has improved significantly.  Assessment/Plan: Active Problems:   HTN (hypertension)   Uncontrolled diabetes mellitus with complications ([place]) and secondary DKA: Unclear if patient is on oral medications or if he has been compliant.  Wife tells me he was just diagnosed a month ago, although she doesn't think he is on anything.  Regardless, without A1c, he will need to be on insulin from here on out.  Start with Lantus nightly and sliding scale.    Acute encephalopathy: Should resolve now that his DKA has been corrected      AKI (acute kidney injury) [facility]): Secondary DKA.  May have some underlying chronic kidney disease.  Will need to see his baseline as we continue IV fluids.    Hyperkalemia: Secondary to DKA.  Resolved with treatment of DKA.  Also noted to have pseudohyponatremia in the setting of hyperglycemia which resolved  sugars improved.    Obesity (BMI 30-39.9): Meets criteria with BMI greater than 30.    Depression: Continue home medications.    BPH (benign prostatic hyperplasia): Continue Flomax.    Peripheral neuropathy: Continue Neurontin.   Code Status: Full code  Family Communication: Discussed with wife by phone   Disposition Plan: Potential discharge home tomorrow if     Consultants:  None   Procedures:  None   Antimicrobials:  None   DVT prophylaxis:  Lovenox   Objective: Vitals:   06/13/19 1545 06/13/19 1615  BP: 112/75   Pulse: 95 92  Resp: 17 14  Temp:    SpO2: 97% 96%    Intake/Output Summary (Last 24 hours) at 06/13/2019 1738 Last data filed at 06/13/2019 0011 Gross per 24 hour  Intake 3000 ml  Output --  Net 3000 ml   Filed Weights   06/12/19 1228  Weight: 95.3 kg   Body mass index is 33.89 kg/m.  Exam:   General:  Currently somnolent  HEENT: Normocephalic, atraumatic. Mucous membranes are dry  Cardiovascular: RRR S1S2   Respiratory: Clear to auscultation bilaterally   Abdomen: Soft, NT, ND +BS   Musculoskeletal: No clubbing, no cyanosis, trace pitting edema   Skin: No skin breaks, tears or lesions  Psychiatry: Somnolent, but no evidence of acute psychoses    Data Reviewed: CBC: Recent Labs  Lab 06/12/19 1230  WBC 5.9  HGB 12.8*  HCT 39.1  MCV 81.1  PLT [id number]   Basic Metabolic Panel: Recent Labs  Lab 06/12/19 1230 06/12/19 1600 06/12/19 2205 06/13/19 0412  NA 122* 131* 143 138  K 6.0* 4.5 3.9 4.9  CL 85* 96* 108 105  CO2 20* 23 22 24   GLUCOSE 1,016* 758* 126* 206*  BUN 94* 89* 78* 71*  CREATININE 4.70* 4.24* 3.36* 2.90*  CALCIUM 9.0 8.8* 8.8* 8.7*  MG  --   --  3.0*  --    GFR: Estimated Creatinine Clearance: 28.2 mL/min (A) (by C-G formula based on SCr of 2.9 mg/dL (H)). Liver Function Tests: No results for input(s): AST, ALT, ALKPHOS, BILITOT, PROT, ALBUMIN in the last 168 hours. No results for input(s):  LIPASE, AMYLASE in the last 168 hours. No results for input(s): AMMONIA in the last 168 hours. Coagulation Profile: No results for input(s): INR, PROTIME in the last 168 hours. Cardiac Enzymes: No results for input(s): CKTOTAL, CKMB, CKMBINDEX, TROPONINI in the last 168 hours. BNP (last 3 results) No results for input(s): PROBNP in the last 8760 hours. HbA1C: Recent Labs    06/12/19 1603  HGBA1C 14.9*   CBG: Recent Labs  Lab 06/13/19 0222 06/13/19 0317 06/13/19 0508 06/13/19 1018 06/13/19 1448  GLUCAP 180* 194* 181* 296* 277*   Lipid Profile: No results for input(s): CHOL, HDL, LDLCALC, TRIG, CHOLHDL, LDLDIRECT in the last 72 hours. Thyroid Function Tests: No results for input(s): TSH, T4TOTAL, FREET4, T3FREE, THYROIDAB in the last 72 hours. Anemia Panel: No results for input(s): VITAMINB12, FOLATE, FERRITIN, TIBC, IRON, RETICCTPCT in the last 72 hours. Urine analysis:    Component Value Date/Time   COLORURINE YELLOW (A) 06/12/2019 1050   APPEARANCEUR CLOUDY (A) 06/12/2019 1050   APPEARANCEUR Clear 10/04/2013 1949   LABSPEC 1.017 06/12/2019 1050   LABSPEC 1.057 10/04/2013 1949   PHURINE 5.0 06/12/2019 1050   GLUCOSEU 150 (A) 06/12/2019 1050   GLUCOSEU Negative 10/04/2013 1949   HGBUR MODERATE (A) 06/12/2019 1050   BILIRUBINUR NEGATIVE 06/12/2019 1050   BILIRUBINUR Negative 10/04/2013 1949   KETONESUR NEGATIVE 06/12/2019 1050   PROTEINUR NEGATIVE 06/12/2019 1050   NITRITE NEGATIVE 06/12/2019 1050   LEUKOCYTESUR LARGE (A) 06/12/2019 1050   LEUKOCYTESUR Negative 10/04/2013 1949   Sepsis Labs: @LABRCNTIP (procalcitonin:4,lacticidven:4)  ) Recent Results (from the past 240 hour(s))  Respiratory Panel by RT PCR (Flu A&B, Covid) - Nasopharyngeal Swab     Status: None   Collection Time: 06/12/19  2:47 PM   Specimen: Nasopharyngeal Swab  Result Value Ref Range Status   SARS Coronavirus 2 by RT PCR NEGATIVE NEGATIVE Final    Comment: (NOTE) SARS-CoV-2 target nucleic  acids are NOT DETECTED. The SARS-CoV-2 RNA is generally detectable in upper respiratoy specimens during the acute phase of infection. The lowest concentration of SARS-CoV-2 viral copies this assay can detect is 131 copies/mL. A negative result does not preclude SARS-Cov-2 infection and should not be used as the sole basis for treatment or other patient management decisions. A negative result may occur with  improper specimen collection/handling, submission of specimen other than nasopharyngeal swab, presence of viral mutation(s) within the areas targeted by this assay, and inadequate number of viral copies (<131 copies/mL). A negative result must be combined with clinical observations, patient history, and epidemiological information. The expected result is Negative. Fact Sheet for Patients:  PinkCheek.be Fact Sheet for Healthcare Providers:  GravelBags.it This test is not yet ap proved or cleared by the [place] FDA and  has been authorized for detection and/or diagnosis of SARS-CoV-2 by  FDA under an Emergency Use Authorization (EUA). This EUA will remain  in effect (meaning this test can be used) for the duration of the COVID-19 declaration under Section 564(b)(1) of the Act, 21 U.S.C. section 360bbb-3(b)(1), unless the authorization is terminated or revoked sooner.    Influenza A by PCR NEGATIVE NEGATIVE Final   Influenza B by PCR NEGATIVE NEGATIVE Final    Comment: (NOTE) The Xpert Xpress SARS-CoV-2/FLU/RSV assay is intended as an aid in  the diagnosis of influenza from Nasopharyngeal swab specimens and  should not be used as a sole basis for treatment. Nasal washings and  aspirates are unacceptable for Xpert Xpress SARS-CoV-2/FLU/RSV  testing. Fact Sheet for Patients: PinkCheek.be Fact Sheet for Healthcare Providers: GravelBags.it This test is not yet  approved or cleared by the [place] FDA and  has been authorized for detection and/or diagnosis of SARS-CoV-2 by  FDA under an Emergency Use Authorization (EUA). This EUA will remain  in effect (meaning this test can be used) for the duration of the  Covid-19 declaration under Section 564(b)(1) of the Act, 21  U.S.C. section 360bbb-3(b)(1), unless the authorization is  terminated or revoked. Performed at [facility], [address] [place], [place] [address]       Studies: No results found.  Scheduled Meds: . atorvastatin  40 mg Oral Daily  . DULoxetine  30 mg Oral Daily  . DULoxetine  60 mg Oral Daily  . gabapentin  300 mg Oral TID  . heparin  5,000 Units Subcutaneous Q8H  . insulin aspart  0-15 Units Subcutaneous TID AC & HS  . insulin aspart  4 Units Subcutaneous TID WC  . insulin glargine  15 Units Subcutaneous QHS  . lamoTRIgine  150 mg Oral BID  . nortriptyline  50 mg Oral QHS  . tamsulosin  0.4 mg Oral Daily    Continuous Infusions: . sodium chloride Stopped (06/13/19 0119)  . sodium chloride       LOS: 1 day     Annita Brod, MD [facility]   06/13/2019, 5:38 PM

## 2019-06-14 LAB — COMPREHENSIVE METABOLIC PANEL
ALT: 18 U/L (ref 0–44)
AST: 25 U/L (ref 15–41)
Albumin: 3.7 g/dL (ref 3.5–5.0)
Alkaline Phosphatase: 73 U/L (ref 38–126)
Anion gap: 11 (ref 5–15)
BUN: 44 mg/dL — ABNORMAL HIGH (ref 8–23)
CO2: 24 mmol/L (ref 22–32)
Calcium: 8.7 mg/dL — ABNORMAL LOW (ref 8.9–10.3)
Chloride: 103 mmol/L (ref 98–111)
Creatinine, Ser: 2.06 mg/dL — ABNORMAL HIGH (ref 0.61–1.24)
GFR calc Af Amer: 39 mL/min — ABNORMAL LOW (ref >60)
GFR calc non Af Amer: 33 mL/min — ABNORMAL LOW (ref >60)
Glucose, Bld: 237 mg/dL — ABNORMAL HIGH (ref 70–99)
Potassium: 4.2 mmol/L (ref 3.5–5.1)
Sodium: 138 mmol/L (ref 135–145)
Total Bilirubin: 1.7 mg/dL — ABNORMAL HIGH (ref 0.3–1.2)
Total Protein: 7.2 g/dL (ref 6.5–8.1)

## 2019-06-14 LAB — CBC
HCT: 37.7 % — ABNORMAL LOW (ref 39.0–52.0)
Hemoglobin: 12.2 g/dL — ABNORMAL LOW (ref 13.0–17.0)
MCH: 26.4 pg (ref 26.0–34.0)
MCHC: 32.4 g/dL (ref 30.0–36.0)
MCV: 81.6 fL (ref 80.0–100.0)
Platelets: 196 10*3/uL (ref 150–400)
RBC: 4.62 MIL/uL (ref 4.22–5.81)
RDW: 12.9 % (ref 11.5–15.5)
WBC: 6.4 10*3/uL (ref 4.0–10.5)
nRBC: 0 % (ref 0.0–0.2)

## 2019-06-14 LAB — GLUCOSE, CAPILLARY
Glucose-Capillary: 247 mg/dL — ABNORMAL HIGH (ref 70–99)
Glucose-Capillary: 236 mg/dL — ABNORMAL HIGH (ref 70–99)
Glucose-Capillary: 240 mg/dL — ABNORMAL HIGH (ref 70–99)
Glucose-Capillary: 253 mg/dL — ABNORMAL HIGH (ref 70–99)

## 2019-06-14 MED ORDER — INSULIN GLARGINE 100 UNIT/ML ~~LOC~~ SOLN
20.0000 [IU] | Freq: Every day | SUBCUTANEOUS | Status: DC
  Administered 2019-06-14 – 2019-06-15 (×2): 20 [IU] via SUBCUTANEOUS
  Filled 2019-06-14 (×3): qty 0.2

## 2019-06-14 MED ORDER — INSULIN STARTER KIT- SYRINGES (ENGLISH)
1.0000 | Freq: Once | Status: AC
  Administered 2019-06-14: 1
  Filled 2019-06-14 (×2): qty 1

## 2019-06-14 MED ORDER — ENSURE MAX PROTEIN PO LIQD
11.0000 [oz_av] | Freq: Every day | ORAL | Status: DC
  Filled 2019-06-14: qty 330

## 2019-06-14 MED ORDER — METOCLOPRAMIDE HCL 5 MG/ML IJ SOLN
5.0000 mg | Freq: Three times a day (TID) | INTRAMUSCULAR | Status: DC
  Administered 2019-06-14 – 2019-06-16 (×7): 5 mg via INTRAVENOUS
  Filled 2019-06-14 (×7): qty 2

## 2019-06-14 MED ORDER — LIVING WELL WITH DIABETES BOOK
Freq: Once | Status: AC
  Filled 2019-06-14 (×2): qty 1

## 2019-06-14 MED ORDER — GLUCERNA SHAKE PO LIQD
237.0000 mL | Freq: Three times a day (TID) | ORAL | Status: DC
  Administered 2019-06-14 – 2019-06-16 (×6): 237 mL via ORAL

## 2019-06-14 MED ORDER — ADULT MULTIVITAMIN W/MINERALS CH
1.0000 | ORAL_TABLET | Freq: Every day | ORAL | Status: DC
  Administered 2019-06-14 – 2019-06-16 (×3): 1 via ORAL
  Filled 2019-06-14 (×3): qty 1

## 2019-06-14 MED ORDER — CALCIUM CARBONATE ANTACID 500 MG PO CHEW
1.0000 | CHEWABLE_TABLET | ORAL | Status: DC | PRN
  Filled 2019-06-14: qty 1

## 2019-06-14 NOTE — Progress Notes (Signed)
PROGRESS NOTE  Daniel [name] [id number] DOB: 12/24/1955 DOA: 06/12/2019 PCP: Kirk Ruths, MD  HPI/Recap of past 52 hours: 64 year old male with past medical history of poorly controlled diabetes (although there are no oral medications listed on his home med list), hypertension, reported seizure disorder, and obesity presented to the emergency room on 1/15 with lethargy.  T he reportedly emergency room had been feeling weak for the past few days and could not get out of bed and when he checked his blood sugar it sat high.  The emergency room, he was found to be in DKA with blood sugar of almost thousand and an anion gap of 17 as well as acute kidney injury and hyperkalemia.  Patient was started on insulin drip and IV fluids.  He was admitted to the hospitalist service  By this morning, anion gap had corrected and CBGs were stable.  Patient was able to be weaned off insulin drip.  His A1c came back markedly elevated at 14.9.  Creatinine continues to trend downward.  This morning, patient had several episodes of nausea and vomiting.  He is awake and alert.  He was able to confirm that he was diagnosed with diabetes a month ago, but tells me he was not prescribed any medications for this.  Assessment/Plan: Active Problems:   HTN (hypertension)   Uncontrolled diabetes mellitus with complications ([place]) and secondary DKA: Recently diagnosed, but not started on any medications.  Regardless, given A1c, he will need to be on insulin from here on out.  CBG still trending upward, so we will increase Lantus tonight.  Change supplement shakes to Glucerna    Acute encephalopathy: Resolved now that his DKA has been corrected      AKI (acute kidney injury) [facility]): Secondary DKA.  May have some underlying chronic kidney disease.  Continues to improve, not yet at baseline     Hyperkalemia: Secondary to DKA.  Resolved with treatment of DKA.  Also noted to have pseudohyponatremia in the setting of  hyperglycemia which resolved sugars improved.    Obesity (BMI 30-39.9): Meets criteria with BMI greater than 30.  Nausea vomiting: Morning basic metabolic panel confirms that acidosis remains resolved.  Exam notes few bowel sounds.?  Gastroparesis.  Will try course of Reglan    Depression: Continue home medications.    BPH (benign prostatic hyperplasia): Continue Flomax.    Peripheral neuropathy: Continue Neurontin.   Code Status: Full code  Family Communication: Discussed with wife by phone   Disposition Plan: Potential discharge home tomorrow if     Consultants:  None   Procedures:  None   Antimicrobials:  None   DVT prophylaxis:  Lovenox   Objective: Vitals:   06/14/19 0539 06/14/19 1223  BP:  (!) 116/91  Pulse: (!) 110 (!) 115  Resp:  18  Temp:  99.3 F (37.4 C)  SpO2: 98% 96%    Intake/Output Summary (Last 24 hours) at 06/14/2019 1235 Last data filed at 06/14/2019 0410 Gross per 24 hour  Intake 872.54 ml  Output 150 ml  Net 722.54 ml   Filed Weights   06/12/19 1228 06/13/19 2002  Weight: 95.3 kg 96.5 kg   Body mass index is 34.34 kg/m.  Exam:   General:  Awake & alert, fatigued.  HEENT: Normocephalic, atraumatic. Mucous membranes are dry  Cardiovascular: RRR S1S2   Respiratory: Clear to auscultation bilaterally   Abdomen: Soft, NT, ND Scant bowel sounds  Musculoskeletal: No clubbing, no cyanosis, trace pitting edema  Skin: No skin breaks, tears or lesions  Psychiatry: Alert, but no evidence of acute psychoses    Data Reviewed: CBC: Recent Labs  Lab 06/12/19 1230 06/14/19 0522  WBC 5.9 6.4  HGB 12.8* 12.2*  HCT 39.1 37.7*  MCV 81.1 81.6  PLT [id number]   Basic Metabolic Panel: Recent Labs  Lab 06/12/19 1230 06/12/19 1600 06/12/19 2205 06/13/19 0412 06/14/19 0522  NA 122* 131* 143 138 138  K 6.0* 4.5 3.9 4.9 4.2  CL 85* 96* 108 105 103  CO2 20* 23 22 24 24   GLUCOSE 1,016* 758* 126* 206* 237*  BUN 94* 89* 78* 71*  44*  CREATININE 4.70* 4.24* 3.36* 2.90* 2.06*  CALCIUM 9.0 8.8* 8.8* 8.7* 8.7*  MG  --   --  3.0*  --   --    GFR: Estimated Creatinine Clearance: 39.9 mL/min (A) (by C-G formula based on SCr of 2.06 mg/dL (H)). Liver Function Tests: Recent Labs  Lab 06/14/19 0522  AST 25  ALT 18  ALKPHOS 73  BILITOT 1.7*  PROT 7.2  ALBUMIN 3.7   No results for input(s): LIPASE, AMYLASE in the last 168 hours. No results for input(s): AMMONIA in the last 168 hours. Coagulation Profile: No results for input(s): INR, PROTIME in the last 168 hours. Cardiac Enzymes: No results for input(s): CKTOTAL, CKMB, CKMBINDEX, TROPONINI in the last 168 hours. BNP (last 3 results) No results for input(s): PROBNP in the last 8760 hours. HbA1C: Recent Labs    06/12/19 1603  HGBA1C 14.9*   CBG: Recent Labs  Lab 06/13/19 1448 06/13/19 1830 06/13/19 2102 06/14/19 0740 06/14/19 1222  GLUCAP 277* 204* 155* 247* 236*   Lipid Profile: No results for input(s): CHOL, HDL, LDLCALC, TRIG, CHOLHDL, LDLDIRECT in the last 72 hours. Thyroid Function Tests: No results for input(s): TSH, T4TOTAL, FREET4, T3FREE, THYROIDAB in the last 72 hours. Anemia Panel: No results for input(s): VITAMINB12, FOLATE, FERRITIN, TIBC, IRON, RETICCTPCT in the last 72 hours. Urine analysis:    Component Value Date/Time   COLORURINE YELLOW (A) 06/12/2019 1050   APPEARANCEUR CLOUDY (A) 06/12/2019 1050   APPEARANCEUR Clear 10/04/2013 1949   LABSPEC 1.017 06/12/2019 1050   LABSPEC 1.057 10/04/2013 1949   PHURINE 5.0 06/12/2019 1050   GLUCOSEU 150 (A) 06/12/2019 1050   GLUCOSEU Negative 10/04/2013 1949   HGBUR MODERATE (A) 06/12/2019 1050   BILIRUBINUR NEGATIVE 06/12/2019 1050   BILIRUBINUR Negative 10/04/2013 1949   KETONESUR NEGATIVE 06/12/2019 1050   PROTEINUR NEGATIVE 06/12/2019 1050   NITRITE NEGATIVE 06/12/2019 1050   LEUKOCYTESUR LARGE (A) 06/12/2019 1050   LEUKOCYTESUR Negative 10/04/2013 1949   Sepsis  Labs: @LABRCNTIP (procalcitonin:4,lacticidven:4)  ) Recent Results (from the past 240 hour(s))  Respiratory Panel by RT PCR (Flu A&B, Covid) - Nasopharyngeal Swab     Status: None   Collection Time: 06/12/19  2:47 PM   Specimen: Nasopharyngeal Swab  Result Value Ref Range Status   SARS Coronavirus 2 by RT PCR NEGATIVE NEGATIVE Final    Comment: (NOTE) SARS-CoV-2 target nucleic acids are NOT DETECTED. The SARS-CoV-2 RNA is generally detectable in upper respiratoy specimens during the acute phase of infection. The lowest concentration of SARS-CoV-2 viral copies this assay can detect is 131 copies/mL. A negative result does not preclude SARS-Cov-2 infection and should not be used as the sole basis for treatment or other patient management decisions. A negative result may occur with  improper specimen collection/handling, submission of specimen other than nasopharyngeal swab, presence of viral mutation(s) within  the areas targeted by this assay, and inadequate number of viral copies (<131 copies/mL). A negative result must be combined with clinical observations, patient history, and epidemiological information. The expected result is Negative. Fact Sheet for Patients:  PinkCheek.be Fact Sheet for Healthcare Providers:  GravelBags.it This test is not yet ap proved or cleared by the [place] FDA and  has been authorized for detection and/or diagnosis of SARS-CoV-2 by FDA under an Emergency Use Authorization (EUA). This EUA will remain  in effect (meaning this test can be used) for the duration of the COVID-19 declaration under Section 564(b)(1) of the Act, 21 U.S.C. section 360bbb-3(b)(1), unless the authorization is terminated or revoked sooner.    Influenza A by PCR NEGATIVE NEGATIVE Final   Influenza B by PCR NEGATIVE NEGATIVE Final    Comment: (NOTE) The Xpert Xpress SARS-CoV-2/FLU/RSV assay is intended as an aid in   the diagnosis of influenza from Nasopharyngeal swab specimens and  should not be used as a sole basis for treatment. Nasal washings and  aspirates are unacceptable for Xpert Xpress SARS-CoV-2/FLU/RSV  testing. Fact Sheet for Patients: PinkCheek.be Fact Sheet for Healthcare Providers: GravelBags.it This test is not yet approved or cleared by the [place] FDA and  has been authorized for detection and/or diagnosis of SARS-CoV-2 by  FDA under an Emergency Use Authorization (EUA). This EUA will remain  in effect (meaning this test can be used) for the duration of the  Covid-19 declaration under Section 564(b)(1) of the Act, 21  U.S.C. section 360bbb-3(b)(1), unless the authorization is  terminated or revoked. Performed at [facility], [address] [place], [place] [address]       Studies: No results found.  Scheduled Meds: . atorvastatin  40 mg Oral Daily  . DULoxetine  30 mg Oral Daily  . DULoxetine  60 mg Oral Daily  . feeding supplement (GLUCERNA SHAKE)  237 mL Oral TID BM  . gabapentin  300 mg Oral TID  . heparin  5,000 Units Subcutaneous Q8H  . insulin aspart  0-15 Units Subcutaneous TID AC & HS  . insulin aspart  4 Units Subcutaneous TID WC  . insulin glargine  20 Units Subcutaneous QHS  . lamoTRIgine  150 mg Oral BID  . metoCLOPramide (REGLAN) injection  5 mg Intravenous Q8H  . multivitamin with minerals  1 tablet Oral Daily  . nortriptyline  50 mg Oral QHS  . tamsulosin  0.4 mg Oral Daily    Continuous Infusions: . sodium chloride 100 mL/hr at 06/14/19 0818     LOS: 2 days     Annita Brod, MD [facility]   06/14/2019, 12:35 PM

## 2019-06-14 NOTE — Discharge Instructions (Signed)

## 2019-06-14 NOTE — Plan of Care (Signed)
  Problem: Education: Goal: Knowledge of General Education information will improve Description Including pain rating scale, medication(s)/side effects and non-pharmacologic comfort measures Outcome: Progressing   

## 2019-06-14 NOTE — Progress Notes (Signed)
Initial Nutrition Assessment  DOCUMENTATION CODES:   Obesity unspecified  INTERVENTION:   -Will place diet education materials in discharge instructions -Ensure MAX Protein po daily, each supplement provides 150 kcal and 30 grams of protein -Multivitamin with minerals daily  NUTRITION DIAGNOSIS:   Unintentional weight loss related to chronic illness as evidenced by percent weight loss.  GOAL:   Patient will meet greater than or equal to 90% of their needs  MONITOR:   PO intake, Supplement acceptance, Labs, Weight trends, I & O's  REASON FOR ASSESSMENT:   Consult Assessment of nutrition requirement/status  ASSESSMENT:   64 year old male with past medical history of poorly controlled diabetes (although there are no oral medications listed on his home med list), hypertension, reported seizure disorder, and obesity presented to the emergency room on 1/15 with lethargy.  T he reportedly emergency room had been feeling weak for the past few days and could not get out of bed and when he checked his blood sugar it sat high.  The emergency room, he was found to be in DKA with blood sugar of almost thousand and an anion gap of 17 as well as acute kidney injury and hyperkalemia.  **RD working remotely**  RD attempted to speak with pt over the phone but no answer. Noted that pt is mildly HOH. Per chart review, pt has had uncontrolled blood sugars PTA. Pt forgot to take his medication, he is not on insulin. Will place diabetes diet information in discharge instructions for pt to review at home. Suspect he may need outpatient education following discharge.   No PO documented for this admission.  Will likely benefit from protein supplement, will order Ensure Max with daily MVI.  Per weight records, pt has lost 12 lbs since 02/17/19 (5% wt loss x 4 months, insignificant for time frame).   I/Os: +5.7L since admit Emesis: 150 ml x 24 hrs  Medications: IV Zofran PRN Labs reviewed:  CBGs:  155-247  NUTRITION - FOCUSED PHYSICAL EXAM:  Working remotely.  Diet Order:   Diet Order            Diet heart healthy/carb modified Room service appropriate? Yes; Fluid consistency: Thin  Diet effective now              EDUCATION NEEDS:   Not appropriate for education at this time  Skin:  Skin Assessment: Reviewed RN Assessment  Last BM:  1/13  Height:   Ht Readings from Last 1 Encounters:  06/13/19 5\' 6"  (1.676 m)    Weight:   Wt Readings from Last 1 Encounters:  06/13/19 96.5 kg    Ideal Body Weight:  64.5 kg  BMI:  Body mass index is 34.34 kg/m.  Estimated Nutritional Needs:   Kcal:  1800-2000  Protein:  75-85g  Fluid:  2L/day  Clayton Bibles, MS, RD, LDN Inpatient Clinical Dietitian Pager: [phone] After Hours Pager: [phone]

## 2019-06-14 NOTE — Progress Notes (Signed)
Inpatient Diabetes Program Recommendations  AACE/ADA: New Consensus Statement on Inpatient Glycemic Control (2015)  Target Ranges:  Prepandial:   less than 140 mg/dL      Peak postprandial:   less than 180 mg/dL (1-2 hours)      Critically ill patients:  140 - 180 mg/dL   Lab Results  Component Value Date   GLUCAP 236 (H) 06/14/2019   HGBA1C 14.9 (H) 06/12/2019    Review of Glycemic Control  Diabetes history: DM2 Outpatient Diabetes medications: None Current orders for Inpatient glycemic control: Lantus 20 units QHS, Novolog 0-15 units tidwc + 4 units tidwc  HgbA1C - 14.9% - uncontrolled DM Ordered Living Well with Diabetes book and Insulin starter kit Secure text with RN to instruct pt on giving insulin injection via vial/syringe. Also asked for pt to stick finger for all CBG checks.  Will call pt in room to speak with him about HgbA1C of 14.9%, whether pt is monitoring at home, PCP to manage his DM.  Inpatient Diabetes Program Recommendations:     Agree with orders.  Spoke with pt about his diabetes control and HgbA1C of 14.9%. Pt states he has a meter at home, but only checks blood sugars "about once/week." States he drinks regular soda and sweet tea and said "I've gotten off track lately." Pt reports taking no diabetes meds at home. Discussed A1C results  and explained that current A1C indicates an average glucose of 370 mg/dl over the past 2-3 months. Discussed glucose and A1C goals. Discussed importance of checking CBGs and maintaining good CBG control to prevent long-term and short-term complications. Encouraged pt to read through the Living Well book and RN will assist pt in learning how to give himself insulin. Pt states "I can do this!" Will need to further discuss hypoglycemia s/s and treatment. Will f/u with PCP for diabetes management. Will order [facility] consult for co-pay amounts for insulin pens, which may be easier for pt.  Follow-up on 01/18.  Thank you. Lorenda Peck, RD, LDN, CDE Inpatient Diabetes Coordinator [phone]

## 2019-06-15 LAB — BASIC METABOLIC PANEL
Anion gap: 10 (ref 5–15)
BUN: 28 mg/dL — ABNORMAL HIGH (ref 8–23)
CO2: 23 mmol/L (ref 22–32)
Calcium: 8.2 mg/dL — ABNORMAL LOW (ref 8.9–10.3)
Chloride: 105 mmol/L (ref 98–111)
Creatinine, Ser: 1.85 mg/dL — ABNORMAL HIGH (ref 0.61–1.24)
GFR calc Af Amer: 44 mL/min — ABNORMAL LOW (ref >60)
GFR calc non Af Amer: 38 mL/min — ABNORMAL LOW (ref >60)
Glucose, Bld: 201 mg/dL — ABNORMAL HIGH (ref 70–99)
Potassium: 3.5 mmol/L (ref 3.5–5.1)
Sodium: 138 mmol/L (ref 135–145)

## 2019-06-15 LAB — CBC
HCT: 32.5 % — ABNORMAL LOW (ref 39.0–52.0)
Hemoglobin: 10.5 g/dL — ABNORMAL LOW (ref 13.0–17.0)
MCH: 26.6 pg (ref 26.0–34.0)
MCHC: 32.3 g/dL (ref 30.0–36.0)
MCV: 82.5 fL (ref 80.0–100.0)
Platelets: 169 10*3/uL (ref 150–400)
RBC: 3.94 MIL/uL — ABNORMAL LOW (ref 4.22–5.81)
RDW: 12.9 % (ref 11.5–15.5)
WBC: 6.1 10*3/uL (ref 4.0–10.5)
nRBC: 0 % (ref 0.0–0.2)

## 2019-06-15 LAB — GLUCOSE, CAPILLARY
Glucose-Capillary: 193 mg/dL — ABNORMAL HIGH (ref 70–99)
Glucose-Capillary: 309 mg/dL — ABNORMAL HIGH (ref 70–99)
Glucose-Capillary: 216 mg/dL — ABNORMAL HIGH (ref 70–99)
Glucose-Capillary: 125 mg/dL — ABNORMAL HIGH (ref 70–99)

## 2019-06-15 MED ORDER — INSULIN STARTER KIT- PEN NEEDLES (ENGLISH)
1.0000 | Freq: Once | Status: AC
  Administered 2019-06-15: 1
  Filled 2019-06-15: qty 1

## 2019-06-15 NOTE — Progress Notes (Addendum)
PROGRESS NOTE  Daniel [name] [id number] DOB: 1955-12-30 DOA: 06/12/2019 PCP: Kirk Ruths, MD  HPI/Recap of past 73 hours: 64 year old male with past medical history of poorly controlled diabetes (although there are no oral medications listed on his home med list), hypertension, reported seizure disorder, and obesity presented to the emergency room on 1/15 with lethargy.  T he reportedly emergency room had been feeling weak for the past few days and could not get out of bed and when he checked his blood sugar it sat high.  The emergency room, he was found to be in DKA with blood sugar of almost thousand and an anion gap of 17 as well as acute kidney injury and hyperkalemia.  Patient was started on insulin drip and IV fluids.  He was admitted to the hospitalist service  By this morning, anion gap had corrected and CBGs were stable.  Patient was able to be weaned off insulin drip.  His A1c came back markedly elevated at 14.9.  Creatinine continues to trend downward.  Had some episodes of nausea/vomiting on 1/17 treated with Reglan.  These have since improved.  Today, patient is feeling somewhat better.  His renal function is almost normalized.  Assessment/Plan: Active Problems:   HTN (hypertension)   Uncontrolled diabetes mellitus with complications ([place]) and secondary DKA: Recently diagnosed, but not started on any medications.  Regardless, given A1c, he will need to be on insulin from here on out.  CBGs better controlled with increased Lantus dosing.  Lantus looks to be $47 per month.      Acute metabolic encephalopathy: Secondary to initial DKA, present on admission.  Now resolved now that his DKA has been corrected      AKI (acute kidney injury) [facility]): Secondary DKA.  May have some underlying chronic kidney disease.  Continues to improve, not yet at baseline.  Should be there by tomorrow.  Anemia: Patient's hemoglobin dropped from 12.2 the previous day down to 10.5.  I suspect  that some of this earlier was from hemoconcentration and now that he is more aggressively hydrated, his hemoglobin is more accurate.  He may have some likely underlying anemia of chronic disease.  Still, with increased heart rate, will monitor closely to ensure he is having no bleeding episodes.  Recheck hemoglobin in the morning.    Hyperkalemia: Secondary to DKA.  Resolved with treatment of DKA.  Also noted to have pseudohyponatremia in the setting of hyperglycemia which resolved sugars improved.    Obesity (BMI 30-39.9): Meets criteria with BMI greater than 30.  Nausea vomiting: Basic metabolic panel confirms that acidosis remains resolved.  Exam notes few bowel sounds.  Possible gastroparesis, improved with Reglan.    Depression: Continue home medications.    BPH (benign prostatic hyperplasia): Continue Flomax.    Peripheral neuropathy: Continue Neurontin.   Code Status: Full code  Family Communication: Updated wife by phone   Disposition Plan: Potential discharge home tomorrow if renal function at baseline   Consultants:  None   Procedures:  None   Antimicrobials:  None   DVT prophylaxis:  Lovenox   Objective: Vitals:   06/15/19 0558 06/15/19 1304  BP: 114/69 109/74  Pulse: (!) 106 (!) 102  Resp: 20 18  Temp: 98.2 F (36.8 C) 98.4 F (36.9 C)  SpO2: 94% 95%    Intake/Output Summary (Last 24 hours) at 06/15/2019 1608 Last data filed at 06/15/2019 0945 Gross per 24 hour  Intake 2821.73 ml  Output --  Net 2821.73  ml   Filed Weights   06/12/19 1228 06/13/19 2002  Weight: 95.3 kg 96.5 kg   Body mass index is 34.34 kg/m.  Exam:   General:  Awake & alert, no acute distress  HEENT: Normocephalic, atraumatic. Mucous membranes are slightly dry  Cardiovascular: RRR S1S2   Respiratory: Clear to auscultation bilaterally   Abdomen: Soft, NT, ND Scant bowel sounds  Musculoskeletal: No clubbing, no cyanosis, trace pitting edema   Skin: No skin breaks,  tears or lesions  Psychiatry: Alert, no evidence of acute psychoses    Data Reviewed: CBC: Recent Labs  Lab 06/12/19 1230 06/14/19 0522 06/15/19 0647  WBC 5.9 6.4 6.1  HGB 12.8* 12.2* 10.5*  HCT 39.1 37.7* 32.5*  MCV 81.1 81.6 82.5  PLT 221 196 [id number]   Basic Metabolic Panel: Recent Labs  Lab 06/12/19 1600 06/12/19 2205 06/13/19 0412 06/14/19 0522 06/15/19 0647  NA 131* 143 138 138 138  K 4.5 3.9 4.9 4.2 3.5  CL 96* 108 105 103 105  CO2 23 22 24 24 23   GLUCOSE 758* 126* 206* 237* 201*  BUN 89* 78* 71* 44* 28*  CREATININE 4.24* 3.36* 2.90* 2.06* 1.85*  CALCIUM 8.8* 8.8* 8.7* 8.7* 8.2*  MG  --  3.0*  --   --   --    GFR: Estimated Creatinine Clearance: 44.5 mL/min (A) (by C-G formula based on SCr of 1.85 mg/dL (H)). Liver Function Tests: Recent Labs  Lab 06/14/19 0522  AST 25  ALT 18  ALKPHOS 73  BILITOT 1.7*  PROT 7.2  ALBUMIN 3.7   No results for input(s): LIPASE, AMYLASE in the last 168 hours. No results for input(s): AMMONIA in the last 168 hours. Coagulation Profile: No results for input(s): INR, PROTIME in the last 168 hours. Cardiac Enzymes: No results for input(s): CKTOTAL, CKMB, CKMBINDEX, TROPONINI in the last 168 hours. BNP (last 3 results) No results for input(s): PROBNP in the last 8760 hours. HbA1C: No results for input(s): HGBA1C in the last 72 hours. CBG: Recent Labs  Lab 06/14/19 1222 06/14/19 1628 06/14/19 2139 06/15/19 0801 06/15/19 1155  GLUCAP 236* 240* 253* 193* 309*   Lipid Profile: No results for input(s): CHOL, HDL, LDLCALC, TRIG, CHOLHDL, LDLDIRECT in the last 72 hours. Thyroid Function Tests: No results for input(s): TSH, T4TOTAL, FREET4, T3FREE, THYROIDAB in the last 72 hours. Anemia Panel: No results for input(s): VITAMINB12, FOLATE, FERRITIN, TIBC, IRON, RETICCTPCT in the last 72 hours. Urine analysis:    Component Value Date/Time   COLORURINE YELLOW (A) 06/12/2019 1050   APPEARANCEUR CLOUDY (A) 06/12/2019 1050    APPEARANCEUR Clear 10/04/2013 1949   LABSPEC 1.017 06/12/2019 1050   LABSPEC 1.057 10/04/2013 1949   PHURINE 5.0 06/12/2019 1050   GLUCOSEU 150 (A) 06/12/2019 1050   GLUCOSEU Negative 10/04/2013 1949   HGBUR MODERATE (A) 06/12/2019 1050   BILIRUBINUR NEGATIVE 06/12/2019 1050   BILIRUBINUR Negative 10/04/2013 1949   KETONESUR NEGATIVE 06/12/2019 1050   PROTEINUR NEGATIVE 06/12/2019 1050   NITRITE NEGATIVE 06/12/2019 1050   LEUKOCYTESUR LARGE (A) 06/12/2019 1050   LEUKOCYTESUR Negative 10/04/2013 1949   Sepsis Labs: @LABRCNTIP (procalcitonin:4,lacticidven:4)  ) Recent Results (from the past 240 hour(s))  Respiratory Panel by RT PCR (Flu A&B, Covid) - Nasopharyngeal Swab     Status: None   Collection Time: 06/12/19  2:47 PM   Specimen: Nasopharyngeal Swab  Result Value Ref Range Status   SARS Coronavirus 2 by RT PCR NEGATIVE NEGATIVE Final    Comment: (NOTE) SARS-CoV-2 target  nucleic acids are NOT DETECTED. The SARS-CoV-2 RNA is generally detectable in upper respiratoy specimens during the acute phase of infection. The lowest concentration of SARS-CoV-2 viral copies this assay can detect is 131 copies/mL. A negative result does not preclude SARS-Cov-2 infection and should not be used as the sole basis for treatment or other patient management decisions. A negative result may occur with  improper specimen collection/handling, submission of specimen other than nasopharyngeal swab, presence of viral mutation(s) within the areas targeted by this assay, and inadequate number of viral copies (<131 copies/mL). A negative result must be combined with clinical observations, patient history, and epidemiological information. The expected result is Negative. Fact Sheet for Patients:  PinkCheek.be Fact Sheet for Healthcare Providers:  GravelBags.it This test is not yet ap proved or cleared by the [place] FDA and  has been  authorized for detection and/or diagnosis of SARS-CoV-2 by FDA under an Emergency Use Authorization (EUA). This EUA will remain  in effect (meaning this test can be used) for the duration of the COVID-19 declaration under Section 564(b)(1) of the Act, 21 U.S.C. section 360bbb-3(b)(1), unless the authorization is terminated or revoked sooner.    Influenza A by PCR NEGATIVE NEGATIVE Final   Influenza B by PCR NEGATIVE NEGATIVE Final    Comment: (NOTE) The Xpert Xpress SARS-CoV-2/FLU/RSV assay is intended as an aid in  the diagnosis of influenza from Nasopharyngeal swab specimens and  should not be used as a sole basis for treatment. Nasal washings and  aspirates are unacceptable for Xpert Xpress SARS-CoV-2/FLU/RSV  testing. Fact Sheet for Patients: PinkCheek.be Fact Sheet for Healthcare Providers: GravelBags.it This test is not yet approved or cleared by the [place] FDA and  has been authorized for detection and/or diagnosis of SARS-CoV-2 by  FDA under an Emergency Use Authorization (EUA). This EUA will remain  in effect (meaning this test can be used) for the duration of the  Covid-19 declaration under Section 564(b)(1) of the Act, 21  U.S.C. section 360bbb-3(b)(1), unless the authorization is  terminated or revoked. Performed at [facility], [address] [place], [place] [address]       Studies: No results found.  Scheduled Meds: . atorvastatin  40 mg Oral Daily  . DULoxetine  30 mg Oral Daily  . DULoxetine  60 mg Oral Daily  . feeding supplement (GLUCERNA SHAKE)  237 mL Oral TID BM  . gabapentin  300 mg Oral TID  . heparin  5,000 Units Subcutaneous Q8H  . insulin aspart  0-15 Units Subcutaneous TID AC & HS  . insulin aspart  4 Units Subcutaneous TID WC  . insulin glargine  20 Units Subcutaneous QHS  . lamoTRIgine  150 mg Oral BID  . metoCLOPramide (REGLAN) injection  5 mg Intravenous Q8H  .  multivitamin with minerals  1 tablet Oral Daily  . nortriptyline  50 mg Oral QHS  . tamsulosin  0.4 mg Oral Daily    Continuous Infusions: . sodium chloride 125 mL/hr at 06/15/19 0945     LOS: 3 days     Annita Brod, MD [facility]   06/15/2019, 4:08 PM

## 2019-06-15 NOTE — Progress Notes (Signed)
   06/15/19 1400  Clinical Encounter Type  Visited With Patient  Visit Type Initial  Referral From Chaplain  Consult/Referral To [place] visited with patient while rounding. Patient was sitting up in bed drinking juice. Patient made reference to the Lord being with him. Patient said that he is a watch and prayers. Chaplain asked if she could pray with him and patient said yes. Chaplain offered pastoral presence, empathy, and prayer,

## 2019-06-15 NOTE — Progress Notes (Addendum)
Inpatient Diabetes Program Recommendations  AACE/ADA: New Consensus Statement on Inpatient Glycemic Control   Target Ranges:  Prepandial:   less than 140 mg/dL      Peak postprandial:   less than 180 mg/dL (1-2 hours)      Critically ill patients:  140 - 180 mg/dL  Results for Daniel [name], Daniel [name] (MRN [id number]) as of 06/15/2019 08:16  Ref. Range 06/14/2019 07:40 06/14/2019 12:22 06/14/2019 16:28 06/14/2019 21:39 06/15/2019 08:01  Glucose-Capillary Latest Ref Range: 70 - 99 mg/dL 247 (H) 236 (H) 240 (H) 253 (H) 193 (H)   Results for Daniel [name], Daniel [name] (MRN [id number]) as of 06/15/2019 08:16  Ref. Range 06/12/2019 16:03  Hemoglobin A1C Latest Ref Range: 4.8 - 5.6 % 14.9 (H)  Results for Daniel [name], Daniel [name] (MRN [id number]) as of 06/15/2019 08:16  Ref. Range 06/12/2019 12:30  Glucose Latest Ref Range: 70 - 99 mg/dL 1,016 (HH)   Review of Glycemic Control  Diabetes history: DM2 Outpatient Diabetes medications: None Current orders for Inpatient glycemic control: Lantus 20 units QHS, Novolog 0-15 units AC&HS, Novolog 4 units TID with meals for meal coverage  Inpatient Diabetes Program Recommendations:   HbgA1C: A1C 14.9% on 06/12/19 indicating an average glucose of 381 mg/dl over the past 2-3 months.   NOTE: In reviewing chart, noted patient was initially ordered IV insulin and transitioned to SQ insulin on 06/13/19 and no basal insulin was given at time of transition. However, Lantus 20 units was ordered on 06/14/19 and given last night. Fasting glucose 193 mg/dl today. Noted Diabetes Coordinator spoke with patient over the phone on 06/14/19 regarding A1C and insulin. RNs will need to provide insulin education and allow patient to self administer insulin while inpatient.  Noted in [place], that A1C was 7% and glucose 150 mg/dl on 04/07/2019. Current A1C 14.9% on 06/12/19. Patient will likely need insulin for DM control and will need to follow up with PCP. Ordered consult for [facility] Care Management to follow  outpatient.   Addendum 06/15/19_0 :00-Talked with patient regarding DM and insulin. Patient states that he has practiced once giving himself insulin today and he feels he done well with self injecting. Patient reports that he has very limited movement of left hand but states that his wife will be helping him at home with insulin administration. Due to patient's limited left hand mobility, insulin pens may be easier for him. Reviewed and demonstrated insulin pen administration. Patient feels insulin pens will likely be easier for him but again states that his wife will be helping him at home. Had patient demonstrate how to use the insulin pen and patient was able to successfully demonstrate with my help holding the pen. Informed patient that his wife will need to be educated on insulin administration by RNs when she is visiting.  Reviewed hyperglycemia, hypoglycemia along with treatment. Encouraged patient to get glucose tablets to have on hand in case of hypoglycemia. Asked patient to check glucose 3-4 times per day and take DM medications as prescribed.  Encouraged patient to reach out to PCP if he has any issues with hypoglycemia or if his glucose is consistently over 200 mg/dl as he may need adjustments with DM medications.  Informed patient that an insulin starter kit would be reordered since he has not seen it and it is not at bedside. Informed patient that RNs will continue to work with him and his wife on insulin administration. Patient verbalized understanding of information discussed and states he has no questions at this time.  Thanks, Marie , RN, MSN, CDE Diabetes Coordinator Inpatient Diabetes Program [phone] (Team Pager from 8am to 5pm)   

## 2019-06-15 NOTE — Care Management Important Message (Signed)
Important Message  Patient Details  Name: Daniel [name] MRN: [id number] Date of Birth: Jun 11, 1955   Medicare Important Message Given:  Yes     Dannette Barbara 06/15/2019, 12:03 PM

## 2019-06-15 NOTE — TOC Benefit Eligibility Note (Signed)
Transition of Care [facility]) Benefit Eligibility Note    Patient Details  Name: RENZO DAUGHDRILL MRN: [id number] Date of Birth: [birth year]   Medication/Dose: Lantus Insulin daily @HS  (pricing reflects 5.3 ml)  Covered?: Yes  Tier: 3 Drug  Prescription Coverage Preferred Pharmacy: Can use any in-network pharmacy  Spoke with Person/Company/Phone Number:: Glendell Docker, [employer], [phone]  Co-Pay: $47.00 (based on 30 day supply) vs. 20 units  Prior Approval: No          Darius Bump [place] Phone Number: 06/15/2019, 3:40 PM

## 2019-06-16 DIAGNOSIS — N1832 Chronic kidney disease, stage 3b: Secondary | ICD-10-CM

## 2019-06-16 DIAGNOSIS — E1165 Type 2 diabetes mellitus with hyperglycemia: Secondary | ICD-10-CM

## 2019-06-16 DIAGNOSIS — E118 Type 2 diabetes mellitus with unspecified complications: Secondary | ICD-10-CM

## 2019-06-16 DIAGNOSIS — G9341 Metabolic encephalopathy: Secondary | ICD-10-CM

## 2019-06-16 LAB — BASIC METABOLIC PANEL
Anion gap: 8 (ref 5–15)
BUN: 20 mg/dL (ref 8–23)
CO2: 24 mmol/L (ref 22–32)
Calcium: 8.2 mg/dL — ABNORMAL LOW (ref 8.9–10.3)
Chloride: 109 mmol/L (ref 98–111)
Creatinine, Ser: 1.58 mg/dL — ABNORMAL HIGH (ref 0.61–1.24)
GFR calc Af Amer: 53 mL/min — ABNORMAL LOW (ref >60)
GFR calc non Af Amer: 46 mL/min — ABNORMAL LOW (ref >60)
Glucose, Bld: 99 mg/dL (ref 70–99)
Potassium: 3.5 mmol/L (ref 3.5–5.1)
Sodium: 141 mmol/L (ref 135–145)

## 2019-06-16 LAB — GLUCOSE, CAPILLARY
Glucose-Capillary: 114 mg/dL — ABNORMAL HIGH (ref 70–99)
Glucose-Capillary: 207 mg/dL — ABNORMAL HIGH (ref 70–99)
Glucose-Capillary: 128 mg/dL — ABNORMAL HIGH (ref 70–99)

## 2019-06-16 LAB — CBC
HCT: 29.8 % — ABNORMAL LOW (ref 39.0–52.0)
Hemoglobin: 9.6 g/dL — ABNORMAL LOW (ref 13.0–17.0)
MCH: 26.7 pg (ref 26.0–34.0)
MCHC: 32.2 g/dL (ref 30.0–36.0)
MCV: 82.8 fL (ref 80.0–100.0)
Platelets: 172 10*3/uL (ref 150–400)
RBC: 3.6 MIL/uL — ABNORMAL LOW (ref 4.22–5.81)
RDW: 12.7 % (ref 11.5–15.5)
WBC: 5.5 10*3/uL (ref 4.0–10.5)
nRBC: 0 % (ref 0.0–0.2)

## 2019-06-16 MED ORDER — BLOOD GLUCOSE MONITOR KIT: PACK | 0 refills | Status: AC

## 2019-06-16 MED ORDER — INSULIN GLARGINE 100 UNIT/ML ~~LOC~~ SOLN: 20.0000 [IU] | Freq: Every day | SUBCUTANEOUS | 11 refills | Status: DC

## 2019-06-16 NOTE — TOC Transition Note (Signed)
Transition of Care [facility]) - CM/SW Discharge Note   Patient Details  Name: Daniel [name] MRN: [id number] Date of Birth: May 19, 1956  Transition of Care [facility]) CM/SW Contact:  Beverly Sessions, RN Phone Number: 06/16/2019, 5:37 PM   Clinical Narrative:    Patient admitted from home with DKA  Patient states that he lives at home with his wife  PCP Ouida Sills - Wife provides transportation [facility].   Per benefit check Lantuscost is $47.  Patient confirms he will be able to obtain at discharge  Patient states that he has a RW and cane in the home  Patient agreeable to home health services.  States he does not have a preference of agency as long as it is in network with [facility].  Referral made to [facility] with [facility]    Final next level of care: [place] Barriers to Discharge: No Barriers Identified   Patient Goals and CMS Choice        Discharge Placement                       Discharge Plan and Services                          HH Arranged: RN [facility] Agency: [place] Date [place]: 06/16/19   Representative spoke with at [place]: [place] ([place]) Interventions     Readmission Risk Interventions No flowsheet data found.

## 2019-06-16 NOTE — Discharge Summary (Addendum)
Discharge Summary  Daniel [name] [id number] DOB: 1955-12-26  PCP: Kirk Ruths, MD  Admit date: 06/12/2019 Discharge date: 06/16/2019  Time spent: 25 minutes  Recommendations for Outpatient Follow-up:  1. Follow up with PCP in the next 2 weeks 2. New medication: Lantus 20 units nightly 3. Patient will have a home health nurse follow-up to see how he is doing with his checking blood sugars and using Lantus. 4. Referral for outpatient education and classes for diabetic diet  Discharge Diagnoses:  Active Hospital Problems   Diagnosis Date Noted  . AKI (acute kidney injury) ([place]) 06/13/2019  . Hyperkalemia 06/13/2019  . Obesity (BMI 30-39.9) 06/13/2019  . Depression 06/13/2019  . BPH (benign prostatic hyperplasia) 06/13/2019  . Peripheral neuropathy 06/13/2019  . DKA (diabetic ketoacidoses) ([place]) 06/12/2019  . Acute metabolic encephalopathy 93/71/6967  . HTN (hypertension) 04/09/2011  . Uncontrolled diabetes mellitus with complications ([place]) 89/38/1017    Resolved Hospital Problems  No resolved problems to display.    Discharge Condition: Improved, being discharged home  Diet recommendation: Carb modified, low-sodium  Vitals:   06/16/19 0632 06/16/19 1157  BP: 123/77 112/73  Pulse: 89 97  Resp: 20 18  Temp: 98 F (36.7 C) 98.3 F (36.8 C)  SpO2: 98% 98%    History of present illness:  64 year old male with past medical history of poorly controlled diabetes (although there are no oral medications listed on his home med list), hypertension, reported seizure disorder, and obesity presented to the emergency room on 1/15 with lethargy.  T he reportedly emergency room had been feeling weak for the past few days and could not get out of bed and when he checked his blood sugar it sat high.  The emergency room, he was found to be in DKA with blood sugar of almost thousand and an anion gap of 17 as well as acute kidney injury and hyperkalemia.  Patient was started on  insulin drip and IV fluids.  He was admitted to the hospitalist service   Hospital Course:    HTN (hypertension)   Uncontrolled diabetes mellitus with complications ([name]) and secondary DKA: By following morning, patient's anion gap had corrected and CBGs were better stabilized.  Weaned off of insulin drip.  A1c came back elevated at 14.9.  Started on Lantus and this was titrated up.  Recently diagnosed, but not started on any medications.  Regardless, given A1c, he will need to be on insulin from here on out.  CBGs better controlled with increased Lantus dosing.  Lantus looks to be $47 per month.    Will discharge on Lantus 20 units subcu nightly.  Patient has received instruction on using the Lantus pen which she seems to be comfortable with.  We will have home health nurse follow-up as well.  Patient will follow up with his PCP in the next few weeks for adjustment of medications accordingly.    Acute metabolic encephalopathy: Secondary to initial DKA, present on admission.  Now resolved now that his DKA has been corrected      AKI (acute kidney injury) ([place]) in the setting of stage IIIb chronic kidney disease: Secondary to DKA.    With aggressive fluid resuscitation, creatinine down to 1.58 with a GFR of 46 by day of discharge.  Anemia: Patient's hemoglobin dropped from 12.2 the previous day down to 10.5.  I suspect that some of this earlier was from hemoconcentration and now that he is more aggressively hydrated, his hemoglobin is more accurate.  He may  have some likely underlying anemia of chronic disease.  Still, with increased heart rate, will monitor closely to ensure he is having no bleeding episodes.  Recheck hemoglobin in the morning.    Hyperkalemia: Secondary to DKA.  Resolved with treatment of DKA.  Also noted to have pseudohyponatremia in the setting of hyperglycemia which resolved sugars improved.    Obesity (BMI 30-39.9): Meets criteria with BMI greater than 30.  Nausea vomiting:  Basic metabolic panel confirms that acidosis remains resolved.  Exam notes few bowel sounds.  Possible gastroparesis, improved with Reglan.  Looks to be overall better and Reglan discontinued.    Depression: Continue home medications.    BPH (benign prostatic hyperplasia): Continue Flomax.    Peripheral neuropathy: Continue Neurontin.  Procedures:  None  Consultations:  None  Discharge Exam: BP 112/73 (BP Location: Right Arm)   Pulse 97   Temp 98.3 F (36.8 C) (Oral)   Resp 18   Ht '5\' 6"'  (1.676 m)   Wt 96.5 kg   SpO2 98%   BMI 34.34 kg/m   General: Alert and oriented x3, no acute distress Cardiovascular: Regular rate and rhythm, S1-S2 Respiratory: Clear to auscultation bilaterally  Discharge Instructions You were cared for by a hospitalist during your hospital stay. If you have any questions about your discharge medications or the care you received while you were in the hospital after you are discharged, you can call the unit and asked to speak with the hospitalist on call if the hospitalist that took care of you is not available. Once you are discharged, your primary care physician will handle any further medical issues. Please note that NO REFILLS for any discharge medications will be authorized once you are discharged, as it is imperative that you return to your primary care physician (or establish a relationship with a primary care physician if you do not have one) for your aftercare needs so that they can reassess your need for medications and monitor your lab values.  Discharge Instructions    Ambulatory referral to Nutrition and Diabetic Education   Complete by: As directed    Diet Carb Modified   Complete by: As directed    Increase activity slowly   Complete by: As directed      Allergies as of 06/16/2019   No Known Allergies     Medication List    TAKE these medications   atorvastatin 40 MG tablet Commonly known as: LIPITOR Take 40 mg by mouth daily.    blood glucose meter kit and supplies Kit Dispense based on patient and insurance preference. Use up to four times daily as directed. (FOR ICD-9 250.00, 250.01).   DULoxetine 30 MG capsule Commonly known as: CYMBALTA Take 30 mg by mouth daily. Take with 60 mg   DULoxetine 60 MG capsule Commonly known as: CYMBALTA Take 60 mg by mouth daily. Takes with 30 mg   gabapentin 300 MG capsule Commonly known as: Neurontin Take 1 capsule (300 mg total) by mouth 3 (three) times daily.   hydrocortisone 2.5 % rectal cream Commonly known as: ANUSOL-HC Place 1 application rectally as directed.   insulin glargine 100 UNIT/ML injection Commonly known as: LANTUS Inject 0.2 mLs (20 Units total) into the skin at bedtime.   lamoTRIgine 150 MG tablet Commonly known as: LAMICTAL Take 150 mg by mouth 2 (two) times a day.   lisinopril-hydrochlorothiazide 20-25 MG tablet Commonly known as: ZESTORETIC Take 1 tablet by mouth daily.   nortriptyline 50 MG capsule Commonly known  as: PAMELOR Take 50 mg by mouth at bedtime.   tamsulosin 0.4 MG Caps capsule Commonly known as: Flomax Take 1 capsule (0.4 mg total) by mouth daily.      No Known Allergies Follow-up Information    Kirk Ruths, MD Follow up in 2 week(s).   Specialty: Internal Medicine Contact information: [address] [place] [id number] [phone]            The results of significant diagnostics from this hospitalization (including imaging, microbiology, ancillary and laboratory) are listed below for reference.    Significant Diagnostic Studies: No results found.  Microbiology: Recent Results (from the past 240 hour(s))  Respiratory Panel by RT PCR (Flu A&B, Covid) - Nasopharyngeal Swab     Status: None   Collection Time: 06/12/19  2:47 PM   Specimen: Nasopharyngeal Swab  Result Value Ref Range Status   SARS Coronavirus 2 by RT PCR NEGATIVE NEGATIVE Final    Comment:  (NOTE) SARS-CoV-2 target nucleic acids are NOT DETECTED. The SARS-CoV-2 RNA is generally detectable in upper respiratoy specimens during the acute phase of infection. The lowest concentration of SARS-CoV-2 viral copies this assay can detect is 131 copies/mL. A negative result does not preclude SARS-Cov-2 infection and should not be used as the sole basis for treatment or other patient management decisions. A negative result may occur with  improper specimen collection/handling, submission of specimen other than nasopharyngeal swab, presence of viral mutation(s) within the areas targeted by this assay, and inadequate number of viral copies (<131 copies/mL). A negative result must be combined with clinical observations, patient history, and epidemiological information. The expected result is Negative. Fact Sheet for Patients:  PinkCheek.be Fact Sheet for Healthcare Providers:  GravelBags.it This test is not yet ap proved or cleared by the [place] FDA and  has been authorized for detection and/or diagnosis of SARS-CoV-2 by FDA under an Emergency Use Authorization (EUA). This EUA will remain  in effect (meaning this test can be used) for the duration of the COVID-19 declaration under Section 564(b)(1) of the Act, 21 U.S.C. section 360bbb-3(b)(1), unless the authorization is terminated or revoked sooner.    Influenza A by PCR NEGATIVE NEGATIVE Final   Influenza B by PCR NEGATIVE NEGATIVE Final    Comment: (NOTE) The Xpert Xpress SARS-CoV-2/FLU/RSV assay is intended as an aid in  the diagnosis of influenza from Nasopharyngeal swab specimens and  should not be used as a sole basis for treatment. Nasal washings and  aspirates are unacceptable for Xpert Xpress SARS-CoV-2/FLU/RSV  testing. Fact Sheet for Patients: PinkCheek.be Fact Sheet for Healthcare  Providers: GravelBags.it This test is not yet approved or cleared by the [place] FDA and  has been authorized for detection and/or diagnosis of SARS-CoV-2 by  FDA under an Emergency Use Authorization (EUA). This EUA will remain  in effect (meaning this test can be used) for the duration of the  Covid-19 declaration under Section 564(b)(1) of the Act, 21  U.S.C. section 360bbb-3(b)(1), unless the authorization is  terminated or revoked. Performed at [facility], [address] [place], [place] [address]      Labs: Basic Metabolic Panel: Recent Labs  Lab 06/12/19 2205 06/13/19 0412 06/14/19 0522 06/15/19 0647 06/16/19 0538  NA 143 138 138 138 141  K 3.9 4.9 4.2 3.5 3.5  CL 108 105 103 105 109  CO2 '22 24 24 23 24  ' GLUCOSE 126* 206* 237* 201* 99  BUN 78*  71* 44* 28* 20  CREATININE 3.36* 2.90* 2.06* 1.85* 1.58*  CALCIUM 8.8* 8.7* 8.7* 8.2* 8.2*  MG 3.0*  --   --   --   --    Liver Function Tests: Recent Labs  Lab 06/14/19 0522  AST 25  ALT 18  ALKPHOS 73  BILITOT 1.7*  PROT 7.2  ALBUMIN 3.7   No results for input(s): LIPASE, AMYLASE in the last 168 hours. No results for input(s): AMMONIA in the last 168 hours. CBC: Recent Labs  Lab 06/12/19 1230 06/14/19 0522 06/15/19 0647 06/16/19 0538  WBC 5.9 6.4 6.1 5.5  HGB 12.8* 12.2* 10.5* 9.6*  HCT 39.1 37.7* 32.5* 29.8*  MCV 81.1 81.6 82.5 82.8  PLT 221 196 169 172   Cardiac Enzymes: No results for input(s): CKTOTAL, CKMB, CKMBINDEX, TROPONINI in the last 168 hours. BNP: BNP (last 3 results) No results for input(s): BNP in the last 8760 hours.  ProBNP (last 3 results) No results for input(s): PROBNP in the last 8760 hours.  CBG: Recent Labs  Lab 06/15/19 1155 06/15/19 1730 06/15/19 2137 06/16/19 0743 06/16/19 1157  GLUCAP 309* 216* 125* 114* 207*       Signed:  Annita Brod, MD [facility] 06/16/2019, 1:52 PM

## 2019-06-18 ENCOUNTER — Other Ambulatory Visit: Payer: Self-pay

## 2019-06-18 DIAGNOSIS — I129 Hypertensive chronic kidney disease with stage 1 through stage 4 chronic kidney disease, or unspecified chronic kidney disease: Secondary | ICD-10-CM | POA: Diagnosis not present

## 2019-06-18 DIAGNOSIS — F0151 Vascular dementia with behavioral disturbance: Secondary | ICD-10-CM | POA: Diagnosis not present

## 2019-06-18 DIAGNOSIS — G40909 Epilepsy, unspecified, not intractable, without status epilepticus: Secondary | ICD-10-CM | POA: Diagnosis not present

## 2019-06-18 DIAGNOSIS — N1832 Chronic kidney disease, stage 3b: Secondary | ICD-10-CM | POA: Diagnosis not present

## 2019-06-18 DIAGNOSIS — I10 Essential (primary) hypertension: Secondary | ICD-10-CM | POA: Diagnosis not present

## 2019-06-18 DIAGNOSIS — D631 Anemia in chronic kidney disease: Secondary | ICD-10-CM | POA: Diagnosis not present

## 2019-06-18 DIAGNOSIS — E1142 Type 2 diabetes mellitus with diabetic polyneuropathy: Secondary | ICD-10-CM | POA: Diagnosis not present

## 2019-06-18 DIAGNOSIS — I69354 Hemiplegia and hemiparesis following cerebral infarction affecting left non-dominant side: Secondary | ICD-10-CM | POA: Diagnosis not present

## 2019-06-18 DIAGNOSIS — E1122 Type 2 diabetes mellitus with diabetic chronic kidney disease: Secondary | ICD-10-CM | POA: Diagnosis not present

## 2019-06-18 DIAGNOSIS — E1165 Type 2 diabetes mellitus with hyperglycemia: Secondary | ICD-10-CM | POA: Diagnosis not present

## 2019-06-18 NOTE — Patient Outreach (Signed)
[name]) Care Management  06/18/2019  Daniel [name] 1956/03/12 [id number]   Telephone call to patient for post- hospital follow up. Patient states everybody calling and following up with him.  He states home health calling and coming by, doctor's appointment etc..  Discussed with him [facility] services and support and the difference with home health.  He verbalized understanding but is declining further follow up at this time.  Patient is agreeable to at least a letter and brochure to review and will call back if he feels he needs further support.  Plan: RN CM will send letter and brochure and close case.    [name], RN, MSN [place] Management Care Management Coordinator Direct Line [phone] Cell [phone] Toll Free: [phone]  Fax: [phone]

## 2019-06-22 DIAGNOSIS — F329 Major depressive disorder, single episode, unspecified: Secondary | ICD-10-CM | POA: Diagnosis not present

## 2019-06-22 DIAGNOSIS — E1122 Type 2 diabetes mellitus with diabetic chronic kidney disease: Secondary | ICD-10-CM | POA: Diagnosis not present

## 2019-06-22 DIAGNOSIS — Z794 Long term (current) use of insulin: Secondary | ICD-10-CM | POA: Diagnosis not present

## 2019-06-22 DIAGNOSIS — N401 Enlarged prostate with lower urinary tract symptoms: Secondary | ICD-10-CM | POA: Diagnosis not present

## 2019-06-22 DIAGNOSIS — N183 Chronic kidney disease, stage 3 unspecified: Secondary | ICD-10-CM | POA: Diagnosis not present

## 2019-06-22 DIAGNOSIS — E1140 Type 2 diabetes mellitus with diabetic neuropathy, unspecified: Secondary | ICD-10-CM | POA: Diagnosis not present

## 2019-06-22 DIAGNOSIS — Z87891 Personal history of nicotine dependence: Secondary | ICD-10-CM | POA: Diagnosis not present

## 2019-06-23 DIAGNOSIS — N1832 Chronic kidney disease, stage 3b: Secondary | ICD-10-CM | POA: Diagnosis not present

## 2019-06-23 DIAGNOSIS — D631 Anemia in chronic kidney disease: Secondary | ICD-10-CM | POA: Diagnosis not present

## 2019-06-23 DIAGNOSIS — G40909 Epilepsy, unspecified, not intractable, without status epilepticus: Secondary | ICD-10-CM | POA: Diagnosis not present

## 2019-06-23 DIAGNOSIS — E1142 Type 2 diabetes mellitus with diabetic polyneuropathy: Secondary | ICD-10-CM | POA: Diagnosis not present

## 2019-06-23 DIAGNOSIS — F0151 Vascular dementia with behavioral disturbance: Secondary | ICD-10-CM | POA: Diagnosis not present

## 2019-06-23 DIAGNOSIS — I129 Hypertensive chronic kidney disease with stage 1 through stage 4 chronic kidney disease, or unspecified chronic kidney disease: Secondary | ICD-10-CM | POA: Diagnosis not present

## 2019-06-23 DIAGNOSIS — E1122 Type 2 diabetes mellitus with diabetic chronic kidney disease: Secondary | ICD-10-CM | POA: Diagnosis not present

## 2019-06-23 DIAGNOSIS — E1165 Type 2 diabetes mellitus with hyperglycemia: Secondary | ICD-10-CM | POA: Diagnosis not present

## 2019-06-23 DIAGNOSIS — I69354 Hemiplegia and hemiparesis following cerebral infarction affecting left non-dominant side: Secondary | ICD-10-CM | POA: Diagnosis not present

## 2019-06-26 DIAGNOSIS — D631 Anemia in chronic kidney disease: Secondary | ICD-10-CM | POA: Diagnosis not present

## 2019-06-26 DIAGNOSIS — I69354 Hemiplegia and hemiparesis following cerebral infarction affecting left non-dominant side: Secondary | ICD-10-CM | POA: Diagnosis not present

## 2019-06-26 DIAGNOSIS — E1165 Type 2 diabetes mellitus with hyperglycemia: Secondary | ICD-10-CM | POA: Diagnosis not present

## 2019-06-26 DIAGNOSIS — G40909 Epilepsy, unspecified, not intractable, without status epilepticus: Secondary | ICD-10-CM | POA: Diagnosis not present

## 2019-06-26 DIAGNOSIS — I129 Hypertensive chronic kidney disease with stage 1 through stage 4 chronic kidney disease, or unspecified chronic kidney disease: Secondary | ICD-10-CM | POA: Diagnosis not present

## 2019-06-26 DIAGNOSIS — N1832 Chronic kidney disease, stage 3b: Secondary | ICD-10-CM | POA: Diagnosis not present

## 2019-06-26 DIAGNOSIS — F0151 Vascular dementia with behavioral disturbance: Secondary | ICD-10-CM | POA: Diagnosis not present

## 2019-06-26 DIAGNOSIS — E1142 Type 2 diabetes mellitus with diabetic polyneuropathy: Secondary | ICD-10-CM | POA: Diagnosis not present

## 2019-06-26 DIAGNOSIS — E1122 Type 2 diabetes mellitus with diabetic chronic kidney disease: Secondary | ICD-10-CM | POA: Diagnosis not present

## 2019-06-29 DIAGNOSIS — E1165 Type 2 diabetes mellitus with hyperglycemia: Secondary | ICD-10-CM | POA: Diagnosis not present

## 2019-06-29 DIAGNOSIS — I69354 Hemiplegia and hemiparesis following cerebral infarction affecting left non-dominant side: Secondary | ICD-10-CM | POA: Diagnosis not present

## 2019-06-29 DIAGNOSIS — N1832 Chronic kidney disease, stage 3b: Secondary | ICD-10-CM | POA: Diagnosis not present

## 2019-06-29 DIAGNOSIS — F0151 Vascular dementia with behavioral disturbance: Secondary | ICD-10-CM | POA: Diagnosis not present

## 2019-06-29 DIAGNOSIS — E1142 Type 2 diabetes mellitus with diabetic polyneuropathy: Secondary | ICD-10-CM | POA: Diagnosis not present

## 2019-06-29 DIAGNOSIS — G40909 Epilepsy, unspecified, not intractable, without status epilepticus: Secondary | ICD-10-CM | POA: Diagnosis not present

## 2019-06-29 DIAGNOSIS — E1122 Type 2 diabetes mellitus with diabetic chronic kidney disease: Secondary | ICD-10-CM | POA: Diagnosis not present

## 2019-06-29 DIAGNOSIS — D631 Anemia in chronic kidney disease: Secondary | ICD-10-CM | POA: Diagnosis not present

## 2019-06-29 DIAGNOSIS — I129 Hypertensive chronic kidney disease with stage 1 through stage 4 chronic kidney disease, or unspecified chronic kidney disease: Secondary | ICD-10-CM | POA: Diagnosis not present

## 2019-06-30 DIAGNOSIS — E1165 Type 2 diabetes mellitus with hyperglycemia: Secondary | ICD-10-CM | POA: Diagnosis not present

## 2019-06-30 DIAGNOSIS — D631 Anemia in chronic kidney disease: Secondary | ICD-10-CM | POA: Diagnosis not present

## 2019-06-30 DIAGNOSIS — E1142 Type 2 diabetes mellitus with diabetic polyneuropathy: Secondary | ICD-10-CM | POA: Diagnosis not present

## 2019-06-30 DIAGNOSIS — G40909 Epilepsy, unspecified, not intractable, without status epilepticus: Secondary | ICD-10-CM | POA: Diagnosis not present

## 2019-06-30 DIAGNOSIS — F0151 Vascular dementia with behavioral disturbance: Secondary | ICD-10-CM | POA: Diagnosis not present

## 2019-06-30 DIAGNOSIS — I69354 Hemiplegia and hemiparesis following cerebral infarction affecting left non-dominant side: Secondary | ICD-10-CM | POA: Diagnosis not present

## 2019-06-30 DIAGNOSIS — E1122 Type 2 diabetes mellitus with diabetic chronic kidney disease: Secondary | ICD-10-CM | POA: Diagnosis not present

## 2019-06-30 DIAGNOSIS — I129 Hypertensive chronic kidney disease with stage 1 through stage 4 chronic kidney disease, or unspecified chronic kidney disease: Secondary | ICD-10-CM | POA: Diagnosis not present

## 2019-06-30 DIAGNOSIS — N1832 Chronic kidney disease, stage 3b: Secondary | ICD-10-CM | POA: Diagnosis not present

## 2019-07-01 DIAGNOSIS — I129 Hypertensive chronic kidney disease with stage 1 through stage 4 chronic kidney disease, or unspecified chronic kidney disease: Secondary | ICD-10-CM | POA: Diagnosis not present

## 2019-07-01 DIAGNOSIS — E1122 Type 2 diabetes mellitus with diabetic chronic kidney disease: Secondary | ICD-10-CM | POA: Diagnosis not present

## 2019-07-01 DIAGNOSIS — D631 Anemia in chronic kidney disease: Secondary | ICD-10-CM | POA: Diagnosis not present

## 2019-07-01 DIAGNOSIS — N1832 Chronic kidney disease, stage 3b: Secondary | ICD-10-CM | POA: Diagnosis not present

## 2019-07-01 DIAGNOSIS — G40909 Epilepsy, unspecified, not intractable, without status epilepticus: Secondary | ICD-10-CM | POA: Diagnosis not present

## 2019-07-01 DIAGNOSIS — I69354 Hemiplegia and hemiparesis following cerebral infarction affecting left non-dominant side: Secondary | ICD-10-CM | POA: Diagnosis not present

## 2019-07-01 DIAGNOSIS — E1142 Type 2 diabetes mellitus with diabetic polyneuropathy: Secondary | ICD-10-CM | POA: Diagnosis not present

## 2019-07-06 DIAGNOSIS — F0151 Vascular dementia with behavioral disturbance: Secondary | ICD-10-CM | POA: Diagnosis not present

## 2019-07-06 DIAGNOSIS — E1142 Type 2 diabetes mellitus with diabetic polyneuropathy: Secondary | ICD-10-CM | POA: Diagnosis not present

## 2019-07-06 DIAGNOSIS — I129 Hypertensive chronic kidney disease with stage 1 through stage 4 chronic kidney disease, or unspecified chronic kidney disease: Secondary | ICD-10-CM | POA: Diagnosis not present

## 2019-07-06 DIAGNOSIS — D631 Anemia in chronic kidney disease: Secondary | ICD-10-CM | POA: Diagnosis not present

## 2019-07-06 DIAGNOSIS — E1122 Type 2 diabetes mellitus with diabetic chronic kidney disease: Secondary | ICD-10-CM | POA: Diagnosis not present

## 2019-07-06 DIAGNOSIS — N1832 Chronic kidney disease, stage 3b: Secondary | ICD-10-CM | POA: Diagnosis not present

## 2019-07-06 DIAGNOSIS — G40909 Epilepsy, unspecified, not intractable, without status epilepticus: Secondary | ICD-10-CM | POA: Diagnosis not present

## 2019-07-06 DIAGNOSIS — E1165 Type 2 diabetes mellitus with hyperglycemia: Secondary | ICD-10-CM | POA: Diagnosis not present

## 2019-07-06 DIAGNOSIS — I69354 Hemiplegia and hemiparesis following cerebral infarction affecting left non-dominant side: Secondary | ICD-10-CM | POA: Diagnosis not present

## 2019-07-08 DIAGNOSIS — I69354 Hemiplegia and hemiparesis following cerebral infarction affecting left non-dominant side: Secondary | ICD-10-CM | POA: Diagnosis not present

## 2019-07-08 DIAGNOSIS — E1165 Type 2 diabetes mellitus with hyperglycemia: Secondary | ICD-10-CM | POA: Diagnosis not present

## 2019-07-08 DIAGNOSIS — E1122 Type 2 diabetes mellitus with diabetic chronic kidney disease: Secondary | ICD-10-CM | POA: Diagnosis not present

## 2019-07-08 DIAGNOSIS — N1832 Chronic kidney disease, stage 3b: Secondary | ICD-10-CM | POA: Diagnosis not present

## 2019-07-08 DIAGNOSIS — G40909 Epilepsy, unspecified, not intractable, without status epilepticus: Secondary | ICD-10-CM | POA: Diagnosis not present

## 2019-07-08 DIAGNOSIS — E1142 Type 2 diabetes mellitus with diabetic polyneuropathy: Secondary | ICD-10-CM | POA: Diagnosis not present

## 2019-07-08 DIAGNOSIS — I129 Hypertensive chronic kidney disease with stage 1 through stage 4 chronic kidney disease, or unspecified chronic kidney disease: Secondary | ICD-10-CM | POA: Diagnosis not present

## 2019-07-08 DIAGNOSIS — D631 Anemia in chronic kidney disease: Secondary | ICD-10-CM | POA: Diagnosis not present

## 2019-07-08 DIAGNOSIS — F0151 Vascular dementia with behavioral disturbance: Secondary | ICD-10-CM | POA: Diagnosis not present

## 2019-07-09 DIAGNOSIS — I69354 Hemiplegia and hemiparesis following cerebral infarction affecting left non-dominant side: Secondary | ICD-10-CM | POA: Diagnosis not present

## 2019-07-09 DIAGNOSIS — E1142 Type 2 diabetes mellitus with diabetic polyneuropathy: Secondary | ICD-10-CM | POA: Diagnosis not present

## 2019-07-09 DIAGNOSIS — N1832 Chronic kidney disease, stage 3b: Secondary | ICD-10-CM | POA: Diagnosis not present

## 2019-07-09 DIAGNOSIS — I129 Hypertensive chronic kidney disease with stage 1 through stage 4 chronic kidney disease, or unspecified chronic kidney disease: Secondary | ICD-10-CM | POA: Diagnosis not present

## 2019-07-09 DIAGNOSIS — E1165 Type 2 diabetes mellitus with hyperglycemia: Secondary | ICD-10-CM | POA: Diagnosis not present

## 2019-07-09 DIAGNOSIS — E1122 Type 2 diabetes mellitus with diabetic chronic kidney disease: Secondary | ICD-10-CM | POA: Diagnosis not present

## 2019-07-09 DIAGNOSIS — G40909 Epilepsy, unspecified, not intractable, without status epilepticus: Secondary | ICD-10-CM | POA: Diagnosis not present

## 2019-07-09 DIAGNOSIS — D631 Anemia in chronic kidney disease: Secondary | ICD-10-CM | POA: Diagnosis not present

## 2019-07-09 DIAGNOSIS — F0151 Vascular dementia with behavioral disturbance: Secondary | ICD-10-CM | POA: Diagnosis not present

## 2019-07-12 ENCOUNTER — Other Ambulatory Visit: Payer: Self-pay

## 2019-07-12 ENCOUNTER — Inpatient Hospital Stay
Admission: EM | Admit: 2019-07-12 | Discharge: 2019-07-14 | DRG: 101 | Disposition: A | Discharge status: Home Health | Payer: Medicare HMO | Attending: Hospitalist | Admitting: Hospitalist

## 2019-07-12 ENCOUNTER — Encounter: Payer: Self-pay | Admitting: Emergency Medicine

## 2019-07-12 ENCOUNTER — Emergency Department: Payer: Medicare HMO

## 2019-07-12 DIAGNOSIS — E785 Hyperlipidemia, unspecified: Secondary | ICD-10-CM | POA: Diagnosis not present

## 2019-07-12 DIAGNOSIS — I129 Hypertensive chronic kidney disease with stage 1 through stage 4 chronic kidney disease, or unspecified chronic kidney disease: Secondary | ICD-10-CM | POA: Diagnosis not present

## 2019-07-12 DIAGNOSIS — N1831 Chronic kidney disease, stage 3a: Secondary | ICD-10-CM

## 2019-07-12 DIAGNOSIS — Z23 Encounter for immunization: Secondary | ICD-10-CM | POA: Diagnosis not present

## 2019-07-12 DIAGNOSIS — R0902 Hypoxemia: Secondary | ICD-10-CM | POA: Diagnosis not present

## 2019-07-12 DIAGNOSIS — I10 Essential (primary) hypertension: Secondary | ICD-10-CM | POA: Diagnosis present

## 2019-07-12 DIAGNOSIS — G40909 Epilepsy, unspecified, not intractable, without status epilepticus: Secondary | ICD-10-CM | POA: Diagnosis not present

## 2019-07-12 DIAGNOSIS — N183 Chronic kidney disease, stage 3 unspecified: Secondary | ICD-10-CM | POA: Diagnosis present

## 2019-07-12 DIAGNOSIS — R404 Transient alteration of awareness: Secondary | ICD-10-CM | POA: Diagnosis not present

## 2019-07-12 DIAGNOSIS — Z833 Family history of diabetes mellitus: Secondary | ICD-10-CM | POA: Diagnosis not present

## 2019-07-12 DIAGNOSIS — Z79899 Other long term (current) drug therapy: Secondary | ICD-10-CM | POA: Diagnosis not present

## 2019-07-12 DIAGNOSIS — R569 Unspecified convulsions: Secondary | ICD-10-CM

## 2019-07-12 DIAGNOSIS — Z8042 Family history of malignant neoplasm of prostate: Secondary | ICD-10-CM

## 2019-07-12 DIAGNOSIS — N400 Enlarged prostate without lower urinary tract symptoms: Secondary | ICD-10-CM | POA: Diagnosis not present

## 2019-07-12 DIAGNOSIS — F32A Depression, unspecified: Secondary | ICD-10-CM | POA: Diagnosis present

## 2019-07-12 DIAGNOSIS — Z6834 Body mass index (BMI) 34.0-34.9, adult: Secondary | ICD-10-CM

## 2019-07-12 DIAGNOSIS — I69354 Hemiplegia and hemiparesis following cerebral infarction affecting left non-dominant side: Secondary | ICD-10-CM | POA: Diagnosis not present

## 2019-07-12 DIAGNOSIS — R61 Generalized hyperhidrosis: Secondary | ICD-10-CM | POA: Diagnosis not present

## 2019-07-12 DIAGNOSIS — R4182 Altered mental status, unspecified: Secondary | ICD-10-CM | POA: Diagnosis not present

## 2019-07-12 DIAGNOSIS — E1129 Type 2 diabetes mellitus with other diabetic kidney complication: Secondary | ICD-10-CM | POA: Diagnosis present

## 2019-07-12 DIAGNOSIS — R509 Fever, unspecified: Secondary | ICD-10-CM | POA: Diagnosis not present

## 2019-07-12 DIAGNOSIS — E1122 Type 2 diabetes mellitus with diabetic chronic kidney disease: Secondary | ICD-10-CM | POA: Diagnosis present

## 2019-07-12 DIAGNOSIS — Z794 Long term (current) use of insulin: Secondary | ICD-10-CM | POA: Diagnosis not present

## 2019-07-12 DIAGNOSIS — F329 Major depressive disorder, single episode, unspecified: Secondary | ICD-10-CM | POA: Diagnosis present

## 2019-07-12 DIAGNOSIS — Z20822 Contact with and (suspected) exposure to covid-19: Secondary | ICD-10-CM | POA: Diagnosis present

## 2019-07-12 DIAGNOSIS — I951 Orthostatic hypotension: Secondary | ICD-10-CM | POA: Diagnosis present

## 2019-07-12 DIAGNOSIS — Z8249 Family history of ischemic heart disease and other diseases of the circulatory system: Secondary | ICD-10-CM

## 2019-07-12 DIAGNOSIS — N1832 Chronic kidney disease, stage 3b: Secondary | ICD-10-CM | POA: Diagnosis not present

## 2019-07-12 DIAGNOSIS — Z87891 Personal history of nicotine dependence: Secondary | ICD-10-CM | POA: Diagnosis not present

## 2019-07-12 DIAGNOSIS — E1169 Type 2 diabetes mellitus with other specified complication: Secondary | ICD-10-CM | POA: Diagnosis present

## 2019-07-12 DIAGNOSIS — D631 Anemia in chronic kidney disease: Secondary | ICD-10-CM | POA: Diagnosis present

## 2019-07-12 DIAGNOSIS — E11649 Type 2 diabetes mellitus with hypoglycemia without coma: Secondary | ICD-10-CM | POA: Diagnosis not present

## 2019-07-12 DIAGNOSIS — I639 Cerebral infarction, unspecified: Secondary | ICD-10-CM | POA: Diagnosis present

## 2019-07-12 LAB — URINALYSIS, COMPLETE (UACMP) WITH MICROSCOPIC
Bilirubin Urine: NEGATIVE
Glucose, UA: NEGATIVE mg/dL
Ketones, ur: NEGATIVE mg/dL
Leukocytes,Ua: NEGATIVE
Nitrite: NEGATIVE
Protein, ur: 30 mg/dL — AB
Specific Gravity, Urine: 1.015 (ref 1.005–1.030)
Squamous Epithelial / HPF: NONE SEEN (ref 0–5)
pH: 6 (ref 5.0–8.0)

## 2019-07-12 LAB — SARS CORONAVIRUS 2 (TAT 6-24 HRS): SARS Coronavirus 2: NEGATIVE

## 2019-07-12 LAB — CBC
HCT: 34.2 % — ABNORMAL LOW (ref 39.0–52.0)
Hemoglobin: 10.8 g/dL — ABNORMAL LOW (ref 13.0–17.0)
MCH: 26.4 pg (ref 26.0–34.0)
MCHC: 31.6 g/dL (ref 30.0–36.0)
MCV: 83.6 fL (ref 80.0–100.0)
Platelets: 170 10*3/uL (ref 150–400)
RBC: 4.09 MIL/uL — ABNORMAL LOW (ref 4.22–5.81)
RDW: 13.9 % (ref 11.5–15.5)
WBC: 5.3 10*3/uL (ref 4.0–10.5)
nRBC: 0 % (ref 0.0–0.2)

## 2019-07-12 LAB — BASIC METABOLIC PANEL
Anion gap: 13 (ref 5–15)
BUN: 16 mg/dL (ref 8–23)
CO2: 23 mmol/L (ref 22–32)
Calcium: 9.4 mg/dL (ref 8.9–10.3)
Chloride: 102 mmol/L (ref 98–111)
Creatinine, Ser: 1.74 mg/dL — ABNORMAL HIGH (ref 0.61–1.24)
GFR calc Af Amer: 47 mL/min — ABNORMAL LOW (ref >60)
GFR calc non Af Amer: 41 mL/min — ABNORMAL LOW (ref >60)
Glucose, Bld: 111 mg/dL — ABNORMAL HIGH (ref 70–99)
Potassium: 3.8 mmol/L (ref 3.5–5.1)
Sodium: 138 mmol/L (ref 135–145)

## 2019-07-12 LAB — GLUCOSE, CAPILLARY
Glucose-Capillary: 67 mg/dL — ABNORMAL LOW (ref 70–99)
Glucose-Capillary: 142 mg/dL — ABNORMAL HIGH (ref 70–99)
Glucose-Capillary: 120 mg/dL — ABNORMAL HIGH (ref 70–99)

## 2019-07-12 MED ORDER — DULOXETINE HCL 30 MG PO CPEP
30.0000 mg | ORAL_CAPSULE | Freq: Every day | ORAL | Status: DC
  Administered 2019-07-13 – 2019-07-14 (×2): 30 mg via ORAL
  Filled 2019-07-12 (×3): qty 1

## 2019-07-12 MED ORDER — ATORVASTATIN CALCIUM 20 MG PO TABS
40.0000 mg | ORAL_TABLET | Freq: Every day | ORAL | Status: DC
  Administered 2019-07-12 – 2019-07-14 (×3): 40 mg via ORAL
  Filled 2019-07-12 (×3): qty 2

## 2019-07-12 MED ORDER — DULOXETINE HCL 60 MG PO CPEP
60.0000 mg | ORAL_CAPSULE | Freq: Every day | ORAL | Status: DC
  Administered 2019-07-13 – 2019-07-14 (×2): 60 mg via ORAL
  Filled 2019-07-12 (×4): qty 1

## 2019-07-12 MED ORDER — LISINOPRIL-HYDROCHLOROTHIAZIDE 20-25 MG PO TABS: 1.0000 | ORAL_TABLET | Freq: Every day | ORAL | Status: DC

## 2019-07-12 MED ORDER — LORAZEPAM 2 MG/ML IJ SOLN: 1.0000 mg | INTRAMUSCULAR | Status: DC | PRN

## 2019-07-12 MED ORDER — INSULIN GLARGINE 100 UNIT/ML ~~LOC~~ SOLN
18.0000 [IU] | Freq: Every day | SUBCUTANEOUS | Status: DC
  Administered 2019-07-12: 23:00:00 18 [IU] via SUBCUTANEOUS
  Filled 2019-07-12 (×2): qty 0.18

## 2019-07-12 MED ORDER — GABAPENTIN 300 MG PO CAPS
600.0000 mg | ORAL_CAPSULE | Freq: Three times a day (TID) | ORAL | Status: DC
  Administered 2019-07-12 – 2019-07-14 (×6): 600 mg via ORAL
  Filled 2019-07-12 (×6): qty 2

## 2019-07-12 MED ORDER — HYDROCHLOROTHIAZIDE 25 MG PO TABS: 25.0000 mg | ORAL_TABLET | Freq: Every day | ORAL | Status: DC

## 2019-07-12 MED ORDER — NORTRIPTYLINE HCL 25 MG PO CAPS
50.0000 mg | ORAL_CAPSULE | Freq: Every day | ORAL | Status: DC
  Administered 2019-07-12 – 2019-07-13 (×2): 50 mg via ORAL
  Filled 2019-07-12 (×3): qty 2

## 2019-07-12 MED ORDER — ONDANSETRON HCL 4 MG/2ML IJ SOLN: 4.0000 mg | Freq: Three times a day (TID) | INTRAMUSCULAR | Status: DC | PRN

## 2019-07-12 MED ORDER — INSULIN ASPART 100 UNIT/ML ~~LOC~~ SOLN
0.0000 [IU] | SUBCUTANEOUS | Status: DC
  Administered 2019-07-12: 21:00:00 1 [IU] via SUBCUTANEOUS
  Filled 2019-07-12: qty 1

## 2019-07-12 MED ORDER — ACETAMINOPHEN 650 MG RE SUPP: 650.0000 mg | Freq: Four times a day (QID) | RECTAL | Status: DC | PRN

## 2019-07-12 MED ORDER — LISINOPRIL 20 MG PO TABS: 20.0000 mg | ORAL_TABLET | Freq: Every day | ORAL | Status: DC

## 2019-07-12 MED ORDER — ENOXAPARIN SODIUM 40 MG/0.4ML ~~LOC~~ SOLN
40.0000 mg | SUBCUTANEOUS | Status: DC
  Administered 2019-07-12 – 2019-07-13 (×2): 40 mg via SUBCUTANEOUS
  Filled 2019-07-12 (×2): qty 0.4

## 2019-07-12 MED ORDER — TAMSULOSIN HCL 0.4 MG PO CAPS
0.4000 mg | ORAL_CAPSULE | Freq: Every day | ORAL | Status: DC
  Administered 2019-07-13 – 2019-07-14 (×2): 0.4 mg via ORAL
  Filled 2019-07-12 (×2): qty 1

## 2019-07-12 MED ORDER — HYDRALAZINE HCL 25 MG PO TABS: 25.0000 mg | ORAL_TABLET | Freq: Three times a day (TID) | ORAL | Status: DC | PRN

## 2019-07-12 MED ORDER — LAMOTRIGINE 100 MG PO TABS
200.0000 mg | ORAL_TABLET | Freq: Two times a day (BID) | ORAL | Status: DC
  Administered 2019-07-13 – 2019-07-14 (×4): 200 mg via ORAL
  Filled 2019-07-12 (×4): qty 2
  Filled 2019-07-12: qty 8
  Filled 2019-07-12 (×2): qty 2

## 2019-07-12 MED ORDER — INFLUENZA VAC SPLIT QUAD 0.5 ML IM SUSY
0.5000 mL | PREFILLED_SYRINGE | INTRAMUSCULAR | Status: AC
  Administered 2019-07-13: 09:00:00 0.5 mL via INTRAMUSCULAR
  Filled 2019-07-12: qty 0.5

## 2019-07-12 MED ORDER — LAMOTRIGINE 100 MG PO TABS
200.0000 mg | ORAL_TABLET | ORAL | Status: AC
  Administered 2019-07-12: 13:00:00 200 mg via ORAL
  Filled 2019-07-12: qty 2

## 2019-07-12 MED ORDER — PNEUMOCOCCAL VAC POLYVALENT 25 MCG/0.5ML IJ INJ
0.5000 mL | INJECTION | INTRAMUSCULAR | Status: AC
  Administered 2019-07-13: 09:00:00 0.5 mL via INTRAMUSCULAR
  Filled 2019-07-12: qty 0.5

## 2019-07-12 NOTE — ED Triage Notes (Signed)
Pt via EMS from home. Pt had three possible seizures this am. Per EMS, pt was on the floor and post-ictal upon arrival. Pt is currently A&Ox4 upon arrival. Pt has a hx of seizures and stroke that left him with L sided deficits.

## 2019-07-12 NOTE — Consult Note (Signed)
Reason for Consult: seizures Requesting Physician: Dr. Jacqualine Code   CC: seizures  HPI: Daniel [name] is an 64 y.o. male with prior hx of strokes, seizures, HTN. Last seizure about a month ago.  Patient presents with multiple(3) seizures today. Patient's wife states, he was in his normal health, and then eyes glazed over and he had 1 seizure. That is not too unusual for him as he has seizures about every month, she put him back to bed but then he had another 2 seizures after that. At home on Lamictal 150 BID  Past Medical History:  Diagnosis Date  . Diabetes mellitus without complication ([place])   . Hypertension   . Pancreatitis   . Seizures ([place])   . Stroke ([place])   . Stroke [facility]) 2016    Past Surgical History:  Procedure Laterality Date  . TEE WITHOUT CARDIOVERSION N/A 11/01/2014   Procedure: TRANSESOPHAGEAL ECHOCARDIOGRAM (TEE);  Surgeon: Wellington Hampshire, MD;  Location: [facility] ORS;  Service: Cardiovascular;  Laterality: N/A;    Family History  Problem Relation Age of Onset  . CAD Sister   . Diabetes Sister   . Hypertension Sister   . Diabetes Sister   . Prostate cancer Father   . Diabetes Mother   . Bladder Cancer Neg Hx   . Kidney cancer Neg Hx     Social History:  reports that he quit smoking about 4 years ago. His smoking use included cigarettes. He has a 22.50 pack-year smoking history. He has never used smokeless tobacco. He reports that he does not drink alcohol or use drugs.  No Known Allergies  Medications: I have reviewed the patient's current medications.  ROS: Confused, unable to obtain   Physical Examination: Blood pressure 108/83, pulse 86, temperature 98.6 F (37 C), temperature source Oral, resp. rate (!) 22, height 5\' 6"  (1.676 m), weight 90.7 kg, SpO2 99 %.  Neurological Examination   Mental Status: Alert to name and states he is in hospital.  Cranial Nerves: II: Discs flat bilaterally; Visual fields grossly normal, pupils equal, round, reactive to light  and accommodation III,IV, VI: ptosis not present, extra-ocular motions intact bilaterally V,VII: smile symmetric, facial light touch sensation normal bilaterally VIII: hearing normal bilaterally XI: bilateral shoulder shrug XII: midline tongue extension Motor: Generalized weakness with L side being weaker from chronic stroke Tone and bulk:normal tone throughout; no atrophy noted Sensory: decreased L side  Deep Tendon Reflexes: 1+ and symmetric throughout   Laboratory Studies:   Basic Metabolic Panel: Recent Labs  Lab 07/12/19 1044  NA 138  K 3.8  CL 102  CO2 23  GLUCOSE 111*  BUN 16  CREATININE 1.74*  CALCIUM 9.4    Liver Function Tests: No results for input(s): AST, ALT, ALKPHOS, BILITOT, PROT, ALBUMIN in the last 168 hours. No results for input(s): LIPASE, AMYLASE in the last 168 hours. No results for input(s): AMMONIA in the last 168 hours.  CBC: Recent Labs  Lab 07/12/19 1044  WBC 5.3  HGB 10.8*  HCT 34.2*  MCV 83.6  PLT 170    Cardiac Enzymes: No results for input(s): CKTOTAL, CKMB, CKMBINDEX, TROPONINI in the last 168 hours.  BNP: Invalid input(s): POCBNP  CBG: No results for input(s): GLUCAP in the last 168 hours.  Microbiology: Results for orders placed or performed during the hospital encounter of 06/12/19  Respiratory Panel by RT PCR (Flu A&B, Covid) - Nasopharyngeal Swab     Status: None   Collection Time: 06/12/19  2:47 PM  Specimen: Nasopharyngeal Swab  Result Value Ref Range Status   SARS Coronavirus 2 by RT PCR NEGATIVE NEGATIVE Final    Comment: (NOTE) SARS-CoV-2 target nucleic acids are NOT DETECTED. The SARS-CoV-2 RNA is generally detectable in upper respiratoy specimens during the acute phase of infection. The lowest concentration of SARS-CoV-2 viral copies this assay can detect is 131 copies/mL. A negative result does not preclude SARS-Cov-2 infection and should not be used as the sole basis for treatment or other patient  management decisions. A negative result may occur with  improper specimen collection/handling, submission of specimen other than nasopharyngeal swab, presence of viral mutation(s) within the areas targeted by this assay, and inadequate number of viral copies (<131 copies/mL). A negative result must be combined with clinical observations, patient history, and epidemiological information. The expected result is Negative. Fact Sheet for Patients:  PinkCheek.be Fact Sheet for Healthcare Providers:  GravelBags.it This test is not yet ap proved or cleared by the [place] FDA and  has been authorized for detection and/or diagnosis of SARS-CoV-2 by FDA under an Emergency Use Authorization (EUA). This EUA will remain  in effect (meaning this test can be used) for the duration of the COVID-19 declaration under Section 564(b)(1) of the Act, 21 U.S.C. section 360bbb-3(b)(1), unless the authorization is terminated or revoked sooner.    Influenza A by PCR NEGATIVE NEGATIVE Final   Influenza B by PCR NEGATIVE NEGATIVE Final    Comment: (NOTE) The Xpert Xpress SARS-CoV-2/FLU/RSV assay is intended as an aid in  the diagnosis of influenza from Nasopharyngeal swab specimens and  should not be used as a sole basis for treatment. Nasal washings and  aspirates are unacceptable for Xpert Xpress SARS-CoV-2/FLU/RSV  testing. Fact Sheet for Patients: PinkCheek.be Fact Sheet for Healthcare Providers: GravelBags.it This test is not yet approved or cleared by the [place] FDA and  has been authorized for detection and/or diagnosis of SARS-CoV-2 by  FDA under an Emergency Use Authorization (EUA). This EUA will remain  in effect (meaning this test can be used) for the duration of the  Covid-19 declaration under Section 564(b)(1) of the Act, 21  U.S.C. section 360bbb-3(b)(1), unless the  authorization is  terminated or revoked. Performed at [facility], [address] [place], [place] [address]     Coagulation Studies: No results for input(s): LABPROT, INR in the last 72 hours.  Urinalysis: No results for input(s): COLORURINE, LABSPEC, PHURINE, GLUCOSEU, HGBUR, BILIRUBINUR, KETONESUR, PROTEINUR, UROBILINOGEN, NITRITE, LEUKOCYTESUR in the last 168 hours.  Invalid input(s): APPERANCEUR  Lipid Panel:     Component Value Date/Time   CHOL 186 10/28/2014 0355   TRIG 307 (H) 10/28/2014 0355   HDL 37 (L) 10/28/2014 0355   CHOLHDL 5.0 10/28/2014 0355   VLDL 61 (H) 10/28/2014 0355   LDLCALC 88 10/28/2014 0355    HgbA1C:  Lab Results  Component Value Date   HGBA1C 14.9 (H) 06/12/2019    Urine Drug Screen:  No results found for: LABOPIA, COCAINSCRNUR, LABBENZ, AMPHETMU, THCU, LABBARB  Alcohol Level: No results for input(s): ETH in the last 168 hours.  Other results: EKG: normal EKG, normal sinus rhythm, unchanged from previous tracings.  Imaging: CT Head Wo Contrast  Result Date: 07/12/2019 CLINICAL DATA:  Pt via EMS from home. Pt had three possible seizures this am. Per EMS, pt was on the floor and post-ictal upon arrival. Pt is currently AANDOx4 upon arrival. Pt has a hx of seizures and stroke that left him with L sided  deficits. EXAM: CT HEAD WITHOUT CONTRAST TECHNIQUE: Contiguous axial images were obtained from the base of the skull through the vertex without intravenous contrast. COMPARISON:  CT head 11/30/2018 FINDINGS: Brain: Remote moderate to large right MCA infarct. Periventricular white matter hypodensity consistent with chronic small vessel ischemic change. No evidence of acute infarction, hemorrhage, hydrocephalus, extra-axial collection or mass lesion. Vascular: No hyperdense vessel or unexpected calcification. Skull: Normal. Negative for fracture or focal lesion. Sinuses/Orbits: No acute finding. Other: None. IMPRESSION: 1. No acute intracranial  abnormality. 2. Remote moderate to large right MCA infarct. Electronically Signed   By: Audie Pinto M.D.   On: 07/12/2019 11:52     Assessment/Plan:   64 y.o. male with prior hx of strokes, seizures, HTN. Last seizure about a month ago.  Patient presents with multiple(3) seizures today. Patient's wife states, he was in his normal health, and then eyes glazed over and he had 1 seizure. That is not too unusual for him as he has seizures about every month, she put him back to bed but then he had another 2 seizures after that. At home on Lamictal 150 BID  - CTH with chronic R MCA stroke - Patient was post ictal but now improving and waking up - Lamictal increase to 200 BID. Would hold off adding another agent at this time - Observation and if back to baseline consider d/c on increased Lamictal dose - Neurology follow up as out patient  07/12/2019, 12:11 PM

## 2019-07-12 NOTE — ED Provider Notes (Signed)
[facility] Emergency Department Provider Note   ____________________________________________   First MD Initiated Contact with Patient 07/12/19 1054     (approximate)  I have reviewed the triage vital signs and the nursing notes.   HISTORY  Chief Complaint Seizures  EM caveat: Confusion, likely postictal, poor historian as patient unable to recall events of seizure  HPI TRAVEON LOURO is a 64 y.o. male here for evaluation of seizures  EMS reports patient family witnessed him to have 3 seizures today. Patient's wife arrived, she reports that he got up, was in his normal health, and then eyes glazed over and he had 1 seizure. That is not too unusual for him as he has seizures about every month, she put him back to bed but then he had another 2 seizures after that. This prompted her to call EMS. He is currently taking Lamictal for seizure management. Was recently in the hospital for diabetes complications and the increased his insulin dose slightly with his primary care recently, but his blood sugars been averaging about 120s  He has had no recent fevers or chills. He was in the hospital for diabetic issues, he is not been recently ill since that time.   Past Medical History:  Diagnosis Date  . Diabetes mellitus without complication ([place])   . Hypertension   . Pancreatitis   . Seizures ([name])   . Stroke ([place])   . Stroke [facility]) 2016    Patient Active Problem List   Diagnosis Date Noted  . Seizure ([place]) 07/12/2019  . HLD (hyperlipidemia) 07/12/2019  . Type II diabetes mellitus with renal manifestations ([place]) 07/12/2019  . CKD (chronic kidney disease), stage IIIb 07/12/2019  . AKI (acute kidney injury) ([place]) 06/13/2019  . Hyperkalemia 06/13/2019  . Obesity (BMI 30-39.9) 06/13/2019  . Depression 06/13/2019  . BPH (benign prostatic hyperplasia) 06/13/2019  . Peripheral neuropathy 06/13/2019  . DKA (diabetic ketoacidoses) ([place]) 06/12/2019  . Acute  metabolic encephalopathy 94/17/4081  . Stroke ([place]) 10/27/2014  . Lymphadenitis 04/09/2011  . HTN (hypertension) 04/09/2011  . Uncontrolled diabetes mellitus with complications ([place]) 44/81/8563    Past Surgical History:  Procedure Laterality Date  . TEE WITHOUT CARDIOVERSION N/A 11/01/2014   Procedure: TRANSESOPHAGEAL ECHOCARDIOGRAM (TEE);  Surgeon: Wellington Hampshire, MD;  Location: [facility] ORS;  Service: Cardiovascular;  Laterality: N/A;    Prior to Admission medications   Medication Sig Start Date End Date Taking? Authorizing Provider  atorvastatin (LIPITOR) 40 MG tablet Take 40 mg by mouth daily. 08/17/18   [provider]  blood glucose meter kit and supplies KIT Dispense based on patient and insurance preference. Use up to four times daily as directed. (FOR ICD-9 250.00, 250.01). 06/16/19   Annita Brod, MD  DULoxetine (CYMBALTA) 30 MG capsule Take 30 mg by mouth daily. Take with 60 mg 10/21/18   [provider]  DULoxetine (CYMBALTA) 60 MG capsule Take 60 mg by mouth daily. Takes with 30 mg 10/24/18   [provider]  gabapentin (NEURONTIN) 300 MG capsule Take 1 capsule (300 mg total) by mouth 3 (three) times daily. 12/02/18 12/02/19  Loletha Grayer, MD  hydrocortisone (ANUSOL-HC) 2.5 % rectal cream Place 1 application rectally as directed. 05/18/19   [provider]  insulin glargine (LANTUS) 100 UNIT/ML injection Inject 0.2 mLs (20 Units total) into the skin at bedtime. 06/16/19   Annita Brod, MD  lamoTRIgine (LAMICTAL) 150 MG tablet Take 150 mg by mouth 2 (two) times a day.  08/17/18   [provider]  lisinopril-hydrochlorothiazide (PRINZIDE,ZESTORETIC) 20-25 MG tablet Take 1 tablet by mouth daily.  06/06/17   [provider]  nortriptyline (PAMELOR) 50 MG capsule Take 50 mg by mouth at bedtime.    [provider]  tamsulosin (FLOMAX) 0.4 MG CAPS capsule Take 1 capsule (0.4 mg total) by mouth daily. 07/24/17   Hollice Espy, MD    Allergies Patient has no known allergies.  Family History  Problem Relation Age of Onset  . CAD Sister   . Diabetes Sister   . Hypertension Sister   . Diabetes Sister   . Prostate cancer Father   . Diabetes Mother   . Bladder Cancer Neg Hx   . Kidney cancer Neg Hx     Social History Social History   Tobacco Use  . Smoking status: Former Smoker    Packs/day: 0.50    Years: 45.00    Pack years: 22.50    Types: Cigarettes    Quit date: 10/28/2014    Years since quitting: 4.7  . Smokeless tobacco: Never Used  Substance Use Topics  . Alcohol use: Never  . Drug use: No    Review of Systems -largely gathered from patient's wife Constitutional: No fever/chills Cardiovascular: Denies chest pain. Respiratory: Denies shortness of breath. Gastrointestinal: No abdominal pain.   Genitourinary: Negative for dysuria.  Did urinate on himself during the seizure episode. Musculoskeletal: Negative for back pain. Skin: Negative for rash. Neurological: Negative for headaches, areas of focal weakness or numbness except chronic left leg and left arm.  Patient himself reports being in no pain or discomfort. Chronic weakness left arm left leg not new. He does not recall having seizures today. No headache. No new weakness. No chest pain no trouble breathing.  ____________________________________________   PHYSICAL EXAM:  VITAL SIGNS: ED Triage Vitals  Enc Vitals Group     BP 07/12/19 1047 115/77     Pulse Rate 07/12/19 1047 72     Resp 07/12/19 1047 11     Temp 07/12/19 1047 98.6 F (37 C)     Temp Source 07/12/19 1047 Oral     SpO2 07/12/19 1047 96 %     Weight 07/12/19 1049 200 lb (90.7 kg)     Height 07/12/19 1049 5' 6" (1.676 m)     Head Circumference --      Peak Flow --      Pain Score 07/12/19 1048 0     Pain Loc --      Pain Edu? --      Excl. in [place]? --     Constitutional: Alert and oriented.  Slightly somnolent, slightly confused.  A little slow in  mentation. Eyes: Conjunctivae are normal. Head: Atraumatic. Nose: No congestion/rhinnorhea. Mouth/Throat: Mucous membranes are slightly dry. Neck: No stridor.  Cardiovascular: Normal rate, regular rhythm. Grossly normal heart sounds.  Good peripheral circulation. Respiratory: Normal respiratory effort.  No retractions. Lungs CTAB. Gastrointestinal: Soft and nontender. No distention. Musculoskeletal: No lower extremity tenderness nor edema.  Weakness including paralysis left upper extremity and about 2-5 strength left lower extremity.  Patient and wife report this is chronic Normal 5-5 use right upper extremity right lower extremity to command. Neurologic:  Normal speech and language is somewhat slow in his cognition and thought processing at this time. No gross focal neurologic deficits are appreciated.  Skin:  Skin is warm, dry and intact. No rash noted. Psychiatric: Mood and affect are calm  ____________________________________________  LABS (all labs ordered are listed, but only abnormal results are displayed)  Labs Reviewed  CBC - Abnormal; Notable for the following components:      Result Value   RBC 4.09 (*)    Hemoglobin 10.8 (*)    HCT 34.2 (*)    All other components within normal limits  BASIC METABOLIC PANEL - Abnormal; Notable for the following components:   Glucose, Bld 111 (*)    Creatinine, Ser 1.74 (*)    GFR calc non Af Amer 41 (*)    GFR calc Af Amer 47 (*)    All other components within normal limits  URINALYSIS, COMPLETE (UACMP) WITH MICROSCOPIC - Abnormal; Notable for the following components:   Color, Urine YELLOW (*)    APPearance CLEAR (*)    Hgb urine dipstick SMALL (*)    Protein, ur 30 (*)    Bacteria, UA RARE (*)    All other components within normal limits  SARS CORONAVIRUS 2 (TAT 6-24 HRS)   ____________________________________________  EKG  Reviewed inter by me at 1100 Heart rate 90 QRS is 99 QTc 440 Normal sinus rhythm no evidence of  ischemia or ectopy ____________________________________________  RADIOLOGY  CT Head Wo Contrast  Result Date: 07/12/2019 CLINICAL DATA:  Pt via EMS from home. Pt had three possible seizures this am. Per EMS, pt was on the floor and post-ictal upon arrival. Pt is currently AANDOx4 upon arrival. Pt has a hx of seizures and stroke that left him with L sided deficits. EXAM: CT HEAD WITHOUT CONTRAST TECHNIQUE: Contiguous axial images were obtained from the base of the skull through the vertex without intravenous contrast. COMPARISON:  CT head 11/30/2018 FINDINGS: Brain: Remote moderate to large right MCA infarct. Periventricular white matter hypodensity consistent with chronic small vessel ischemic change. No evidence of acute infarction, hemorrhage, hydrocephalus, extra-axial collection or mass lesion. Vascular: No hyperdense vessel or unexpected calcification. Skull: Normal. Negative for fracture or focal lesion. Sinuses/Orbits: No acute finding. Other: None. IMPRESSION: 1. No acute intracranial abnormality. 2. Remote moderate to large right MCA infarct. Electronically Signed   By: Audie Pinto M.D.   On: 07/12/2019 11:52    Head CT reviewed negative for acute findings ____________________________________________   PROCEDURES  Procedure(s) performed: None  Procedures  Critical Care performed: No  ____________________________________________   INITIAL IMPRESSION / ASSESSMENT AND PLAN / ED COURSE  Pertinent labs & imaging results that were available during my care of the patient were reviewed by me and considered in my medical decision making (see chart for details).   Patient presents after description of what appear to be multiple seizures, one episode with incontinence as well.  No recent Covid exposure.  No recent illness except being hospitalized about a month ago with DKA.  No new focal exam findings.  Patient seen and evaluated by Dr. Irish Elders, appreciate his  recommendations  Clinical Course as of Jul 12 1311  Sun Jul 12, 2019  1102 Emergency contact information, Priscilla/wife, called, but phone service reports that this call cannot be completed.  Attempted again, line unable to go through "can't be completed at this time"   [MQ]  1310 Patient resting comfortably this time.  Wife at bedside.  Patient alert, oriented.  Discussed with neurology Dr. Irish Elders, because of the patient's multiple seizures today he would asked that we increase the patient's Lamictal by giving additional 200 mg now.  Patient will be admitted for further management and observation of the hospitalist service due to multiple  seizures.  Patient and his wife understanding agreeable to plan along with the patient   [MQ]    Clinical Course User Index [MQ] Delman Kitten, MD    Admission discussed with hospitalist Dr. Blaine Hamper.  Patient mental status continues to improve ____________________________________________   FINAL CLINICAL IMPRESSION(S) / ED DIAGNOSES  Final diagnoses:  Seizures ([place])        Note:  This document was prepared using Dragon voice recognition software and may include unintentional dictation errors       Delman Kitten, MD 07/12/19 1313

## 2019-07-12 NOTE — ED Notes (Signed)
Pt soiled pants and brief prior to hospital arrival. Pt pad changed and pt clean and dry at this time. Condom cath applied to pt due to pt's weakness.

## 2019-07-12 NOTE — ED Notes (Signed)
Transporting pt to room #160

## 2019-07-12 NOTE — ED Notes (Signed)
Attempted to call report.  Unable to take at this time. 

## 2019-07-12 NOTE — H&P (Signed)
History and Physical    Daniel [name] [id number] DOB: [birth year] DOA: 07/12/2019  Referring MD/NP/PA:   PCP: Kirk Ruths, MD   Patient coming from:  The patient is coming from home.  At baseline, pt is independent for most of ADL.        Chief Complaint: seizure  HPI: Daniel [name] is a 64 y.o. male with medical history significant of hypertension, hyperlipidemia, diabetes mellitus, stroke, depression, seizure, pancreatitis, CKD-3B, BPH, who presents with seizure.  Pt has hx of seizure.  He usually has seizure approximately once a month.  Last seizure was about a month ago.  Today, he has had 3 episodes of seizure. Patient's wife states, he was in his normal health, and then eyes glazed over. He was initially in postictal status. His mental status has gradually improved in ED. When I saw patient in ED, he is drowsy and mildly confused, but is orientated x3.  Patient does not facial droop or slurred speech.  Denies chest pain, shortness breath, cough.  No nausea, vomiting, diarrhea, abdominal pain, symptoms of UTI. Pt is taking Lamictal 150 BID at home.  ED Course: pt was found to have WBC 5.3, pending Covid PCR test, renal function close to baseline, temperature normal, blood pressure 112/88, heart rate 81, oxygen saturation 96% on room air.  CT of the head is negative for acute intracranial abnormalities.  Patient is placed on MedSurg bed for observation.  Review of Systems:   General: no fevers, chills, no body weight gain, has fatigue HEENT: no blurry vision, hearing changes or sore throat Respiratory: no dyspnea, coughing, wheezing CV: no chest pain, no palpitations GI: no nausea, vomiting, abdominal pain, diarrhea, constipation GU: no dysuria, burning on urination, increased urinary frequency, hematuria  Ext: no leg edema Neuro: no unilateral weakness, numbness, or tingling, no vision change or hearing loss. Has seizure Skin: no rash, no skin tear. MSK: No muscle  spasm, no deformity, no limitation of range of movement in spin Heme: No easy bruising.  Travel history: No recent long distant travel.  Allergy: No Known Allergies  Past Medical History:  Diagnosis Date  . Diabetes mellitus without complication ([place])   . Hypertension   . Pancreatitis   . Seizures ([place])   . Stroke ([place])   . Stroke [facility]) 2016    Past Surgical History:  Procedure Laterality Date  . TEE WITHOUT CARDIOVERSION N/A 11/01/2014   Procedure: TRANSESOPHAGEAL ECHOCARDIOGRAM (TEE);  Surgeon: Wellington Hampshire, MD;  Location: [facility] ORS;  Service: Cardiovascular;  Laterality: N/A;    Social History:  reports that he quit smoking about 4 years ago. His smoking use included cigarettes. He has a 22.50 pack-year smoking history. He has never used smokeless tobacco. He reports that he does not drink alcohol or use drugs.  Family History:  Family History  Problem Relation Age of Onset  . CAD Sister   . Diabetes Sister   . Hypertension Sister   . Diabetes Sister   . Prostate cancer Father   . Diabetes Mother   . Bladder Cancer Neg Hx   . Kidney cancer Neg Hx      Prior to Admission medications   Medication Sig Start Date End Date Taking? Authorizing Provider  atorvastatin (LIPITOR) 40 MG tablet Take 40 mg by mouth daily. 08/17/18   [provider]  blood glucose meter kit and supplies KIT Dispense based on patient and insurance preference. Use up to four times daily as directed. (FOR ICD-9  250.00, 250.01). 06/16/19   Annita Brod, MD  DULoxetine (CYMBALTA) 30 MG capsule Take 30 mg by mouth daily. Take with 60 mg 10/21/18   [provider]  DULoxetine (CYMBALTA) 60 MG capsule Take 60 mg by mouth daily. Takes with 30 mg 10/24/18   [provider]  gabapentin (NEURONTIN) 300 MG capsule Take 1 capsule (300 mg total) by mouth 3 (three) times daily. 12/02/18 12/02/19  Loletha Grayer, MD  hydrocortisone (ANUSOL-HC) 2.5 % rectal cream Place 1 application  rectally as directed. 05/18/19   [provider]  insulin glargine (LANTUS) 100 UNIT/ML injection Inject 0.2 mLs (20 Units total) into the skin at bedtime. 06/16/19   Annita Brod, MD  lamoTRIgine (LAMICTAL) 150 MG tablet Take 150 mg by mouth 2 (two) times a day.  08/17/18   [provider]  lisinopril-hydrochlorothiazide (PRINZIDE,ZESTORETIC) 20-25 MG tablet Take 1 tablet by mouth daily.  06/06/17   [provider]  nortriptyline (PAMELOR) 50 MG capsule Take 50 mg by mouth at bedtime.    [provider]  tamsulosin (FLOMAX) 0.4 MG CAPS capsule Take 1 capsule (0.4 mg total) by mouth daily. 07/24/17   Hollice Espy, MD    Physical Exam: Vitals:   07/12/19 1530 07/12/19 1600 07/12/19 1630 07/12/19 1700  BP: 120/84 114/78 117/88 108/82  Pulse: 80 81 81 84  Resp: _0 Temp:      TempSrc:      SpO2: 99% 100% 98% 99%  Weight:      Height:       General: Not in acute distress HEENT:       Eyes: PERRL, EOMI, no scleral icterus.       ENT: No discharge from the ears and nose, no pharynx injection, no tonsillar enlargement.        Neck: No JVD, no bruit, no mass felt. Heme: No neck lymph node enlargement. Cardiac: S1/S2, RRR, No murmurs, No gallops or rubs. Respiratory: No rales, wheezing, rhonchi or rubs. GI: Soft, nondistended, nontender, no rebound pain, no organomegaly, BS present. GU: No hematuria Ext: No pitting leg edema bilaterally. 2+DP/PT pulse bilaterally. Musculoskeletal: No joint deformities, No joint redness or warmth, no limitation of ROM in spin. Skin: No rashes.  Neuro: mildly confused, but oriented X3, cranial nerves II-XII grossly intact, moves all extremities. Psych: Patient is not psychotic, no suicidal or hemocidal ideation.  Labs on Admission: I have personally reviewed following labs and imaging studies  CBC: Recent Labs  Lab 07/12/19 1044  WBC 5.3  HGB 10.8*  HCT 34.2*  MCV 83.6  PLT 540   Basic Metabolic  Panel: Recent Labs  Lab 07/12/19 1044  NA 138  K 3.8  CL 102  CO2 23  GLUCOSE 111*  BUN 16  CREATININE 1.74*  CALCIUM 9.4   GFR: Estimated Creatinine Clearance: 45.3 mL/min (A) (by C-G formula based on SCr of 1.74 mg/dL (H)). Liver Function Tests: No results for input(s): AST, ALT, ALKPHOS, BILITOT, PROT, ALBUMIN in the last 168 hours. No results for input(s): LIPASE, AMYLASE in the last 168 hours. No results for input(s): AMMONIA in the last 168 hours. Coagulation Profile: No results for input(s): INR, PROTIME in the last 168 hours. Cardiac Enzymes: No results for input(s): CKTOTAL, CKMB, CKMBINDEX, TROPONINI in the last 168 hours. BNP (last 3 results) No results for input(s): PROBNP in the last 8760 hours. HbA1C: No results for input(s): HGBA1C in the last 72 hours. CBG: No results for input(s):  GLUCAP in the last 168 hours. Lipid Profile: No results for input(s): CHOL, HDL, LDLCALC, TRIG, CHOLHDL, LDLDIRECT in the last 72 hours. Thyroid Function Tests: No results for input(s): TSH, T4TOTAL, FREET4, T3FREE, THYROIDAB in the last 72 hours. Anemia Panel: No results for input(s): VITAMINB12, FOLATE, FERRITIN, TIBC, IRON, RETICCTPCT in the last 72 hours. Urine analysis:    Component Value Date/Time   COLORURINE YELLOW (A) 07/12/2019 1210   APPEARANCEUR CLEAR (A) 07/12/2019 1210   APPEARANCEUR Clear 10/04/2013 1949   LABSPEC 1.015 07/12/2019 1210   LABSPEC 1.057 10/04/2013 1949   PHURINE 6.0 07/12/2019 1210   GLUCOSEU NEGATIVE 07/12/2019 1210   GLUCOSEU Negative 10/04/2013 1949   HGBUR SMALL (A) 07/12/2019 1210   BILIRUBINUR NEGATIVE 07/12/2019 1210   BILIRUBINUR Negative 10/04/2013 1949   KETONESUR NEGATIVE 07/12/2019 1210   PROTEINUR 30 (A) 07/12/2019 1210   NITRITE NEGATIVE 07/12/2019 1210   LEUKOCYTESUR NEGATIVE 07/12/2019 1210   LEUKOCYTESUR Negative 10/04/2013 1949   Sepsis Labs: _0 (procalcitonin:4,lacticidven:4) )No results found for this or any  previous visit (from the past 240 hour(s)).   Radiological Exams on Admission: CT Head Wo Contrast  Result Date: 07/12/2019 CLINICAL DATA:  Pt via EMS from home. Pt had three possible seizures this am. Per EMS, pt was on the floor and post-ictal upon arrival. Pt is currently AANDOx4 upon arrival. Pt has a hx of seizures and stroke that left him with L sided deficits. EXAM: CT HEAD WITHOUT CONTRAST TECHNIQUE: Contiguous axial images were obtained from the base of the skull through the vertex without intravenous contrast. COMPARISON:  CT head 11/30/2018 FINDINGS: Brain: Remote moderate to large right MCA infarct. Periventricular white matter hypodensity consistent with chronic small vessel ischemic change. No evidence of acute infarction, hemorrhage, hydrocephalus, extra-axial collection or mass lesion. Vascular: No hyperdense vessel or unexpected calcification. Skull: Normal. Negative for fracture or focal lesion. Sinuses/Orbits: No acute finding. Other: None. IMPRESSION: 1. No acute intracranial abnormality. 2. Remote moderate to large right MCA infarct. Electronically Signed   By: Audie Pinto M.D.   On: 07/12/2019 11:52     EKG: Independently reviewed.  Sinus rhythm, QTC 438, low voltage, early R wave progression   Assessment/Plan Principal Problem:   Seizure ([place]) Active Problems:   HTN (hypertension)   Stroke (HCC)   Depression   BPH (benign prostatic hyperplasia)   HLD (hyperlipidemia)   Type II diabetes mellitus with renal manifestations (HCC)   CKD (chronic kidney disease), stage IIIb   Seizure ([place]): CT head is negative for acute intracranial abnormalities.  Neurology was consulted, Dr. Irish Elders who recommended to increase Lamictal dose from 150 to 200 mg twice daily  -place on med-surg bed for obs -Highly appreciate Dr. Beryl Meager recommendation -Seizure precaution -When necessary Ativan for seizure -Lamictal 200 mg twice daily per Dr. Irish Elders  HTN:  -Continue home  medications: Prinzide -hydralazine prn  Stroke [facility]): -lipitor  Depression: -continue home med  BPH (benign prostatic hyperplasia) -Flomax  HLD (hyperlipidemia); -lipitor  Type II diabetes mellitus with renal manifestations ([place]): Most recent A1c 14.9, poorly controled. Patient is taking Metformin and Lantus at home -will decrease Lantus dose from  25 to 18 units daily -SSI  CKD (chronic kidney disease), stage IIIb: Close to baseline.  Baseline creatinine 1.5-2.0.  His creatinine is 1.74, BUN 16 -Follow-up by BMP    DVT ppx: SQ Lovenox Code Status: Full code Family Communication: None at bed side.   Disposition Plan:  Anticipate discharge back to previous home environment Consults called:  none Admission status: Med-surg bed for obs  Date of Service 07/12/2019    [facility]   If 7PM-7AM, please contact night-coverage [email or web address] 07/12/2019, 5:57 PM

## 2019-07-13 ENCOUNTER — Other Ambulatory Visit: Admission: RE | Admit: 2019-07-13 | Payer: Medicare HMO | Source: Ambulatory Visit

## 2019-07-13 DIAGNOSIS — Z6834 Body mass index (BMI) 34.0-34.9, adult: Secondary | ICD-10-CM | POA: Diagnosis not present

## 2019-07-13 DIAGNOSIS — N1832 Chronic kidney disease, stage 3b: Secondary | ICD-10-CM | POA: Diagnosis present

## 2019-07-13 DIAGNOSIS — E785 Hyperlipidemia, unspecified: Secondary | ICD-10-CM | POA: Diagnosis present

## 2019-07-13 DIAGNOSIS — Z87891 Personal history of nicotine dependence: Secondary | ICD-10-CM | POA: Diagnosis not present

## 2019-07-13 DIAGNOSIS — G40909 Epilepsy, unspecified, not intractable, without status epilepticus: Secondary | ICD-10-CM | POA: Diagnosis present

## 2019-07-13 DIAGNOSIS — D631 Anemia in chronic kidney disease: Secondary | ICD-10-CM | POA: Diagnosis present

## 2019-07-13 DIAGNOSIS — E11649 Type 2 diabetes mellitus with hypoglycemia without coma: Secondary | ICD-10-CM | POA: Diagnosis not present

## 2019-07-13 DIAGNOSIS — Z8249 Family history of ischemic heart disease and other diseases of the circulatory system: Secondary | ICD-10-CM | POA: Diagnosis not present

## 2019-07-13 DIAGNOSIS — I951 Orthostatic hypotension: Secondary | ICD-10-CM | POA: Diagnosis present

## 2019-07-13 DIAGNOSIS — N400 Enlarged prostate without lower urinary tract symptoms: Secondary | ICD-10-CM | POA: Diagnosis present

## 2019-07-13 DIAGNOSIS — Z23 Encounter for immunization: Secondary | ICD-10-CM | POA: Diagnosis present

## 2019-07-13 DIAGNOSIS — I129 Hypertensive chronic kidney disease with stage 1 through stage 4 chronic kidney disease, or unspecified chronic kidney disease: Secondary | ICD-10-CM | POA: Diagnosis present

## 2019-07-13 DIAGNOSIS — R569 Unspecified convulsions: Secondary | ICD-10-CM | POA: Diagnosis present

## 2019-07-13 DIAGNOSIS — E1122 Type 2 diabetes mellitus with diabetic chronic kidney disease: Secondary | ICD-10-CM | POA: Diagnosis present

## 2019-07-13 DIAGNOSIS — Z794 Long term (current) use of insulin: Secondary | ICD-10-CM | POA: Diagnosis not present

## 2019-07-13 DIAGNOSIS — N1831 Chronic kidney disease, stage 3a: Secondary | ICD-10-CM | POA: Diagnosis not present

## 2019-07-13 DIAGNOSIS — Z79899 Other long term (current) drug therapy: Secondary | ICD-10-CM | POA: Diagnosis not present

## 2019-07-13 DIAGNOSIS — I69354 Hemiplegia and hemiparesis following cerebral infarction affecting left non-dominant side: Secondary | ICD-10-CM | POA: Diagnosis not present

## 2019-07-13 DIAGNOSIS — F329 Major depressive disorder, single episode, unspecified: Secondary | ICD-10-CM | POA: Diagnosis present

## 2019-07-13 DIAGNOSIS — Z20822 Contact with and (suspected) exposure to covid-19: Secondary | ICD-10-CM | POA: Diagnosis present

## 2019-07-13 DIAGNOSIS — Z8042 Family history of malignant neoplasm of prostate: Secondary | ICD-10-CM | POA: Diagnosis not present

## 2019-07-13 DIAGNOSIS — Z833 Family history of diabetes mellitus: Secondary | ICD-10-CM | POA: Diagnosis not present

## 2019-07-13 LAB — CBC
HCT: 31.3 % — ABNORMAL LOW (ref 39.0–52.0)
Hemoglobin: 10 g/dL — ABNORMAL LOW (ref 13.0–17.0)
MCH: 26.8 pg (ref 26.0–34.0)
MCHC: 31.9 g/dL (ref 30.0–36.0)
MCV: 83.9 fL (ref 80.0–100.0)
Platelets: 183 10*3/uL (ref 150–400)
RBC: 3.73 MIL/uL — ABNORMAL LOW (ref 4.22–5.81)
RDW: 14.1 % (ref 11.5–15.5)
WBC: 5 10*3/uL (ref 4.0–10.5)
nRBC: 0 % (ref 0.0–0.2)

## 2019-07-13 LAB — GLUCOSE, CAPILLARY
Glucose-Capillary: 108 mg/dL — ABNORMAL HIGH (ref 70–99)
Glucose-Capillary: 69 mg/dL — ABNORMAL LOW (ref 70–99)
Glucose-Capillary: 82 mg/dL (ref 70–99)
Glucose-Capillary: 84 mg/dL (ref 70–99)
Glucose-Capillary: 90 mg/dL (ref 70–99)
Glucose-Capillary: 96 mg/dL (ref 70–99)

## 2019-07-13 LAB — BASIC METABOLIC PANEL
Anion gap: 10 (ref 5–15)
BUN: 23 mg/dL (ref 8–23)
CO2: 25 mmol/L (ref 22–32)
Calcium: 9.4 mg/dL (ref 8.9–10.3)
Chloride: 103 mmol/L (ref 98–111)
Creatinine, Ser: 1.9 mg/dL — ABNORMAL HIGH (ref 0.61–1.24)
GFR calc Af Amer: 42 mL/min — ABNORMAL LOW (ref >60)
GFR calc non Af Amer: 36 mL/min — ABNORMAL LOW (ref >60)
Glucose, Bld: 112 mg/dL — ABNORMAL HIGH (ref 70–99)
Potassium: 3.6 mmol/L (ref 3.5–5.1)
Sodium: 138 mmol/L (ref 135–145)

## 2019-07-13 MED ORDER — SODIUM CHLORIDE 0.9 % IV SOLN: INTRAVENOUS | Status: AC

## 2019-07-13 MED ORDER — LAMOTRIGINE 200 MG PO TABS: 200.0000 mg | ORAL_TABLET | Freq: Two times a day (BID) | ORAL | 0 refills | Status: AC

## 2019-07-13 MED ORDER — SODIUM CHLORIDE 0.9 % IV BOLUS
1000.0000 mL | Freq: Once | INTRAVENOUS | Status: AC
  Administered 2019-07-13: 14:00:00 1000 mL via INTRAVENOUS

## 2019-07-13 NOTE — Evaluation (Signed)
Physical Therapy Evaluation Patient Details Name: LC OBLANDER MRN: [id number] DOB: [birth year] Today's Date: 07/13/2019   History of Present Illness  Patient is 64 yo male that presented to ED from home after 3 episodes of seizures.  Prior to this his last seizure was about a month ago.  Patient was recently hospitalized for DKA. PMH of HTN, HLD, type II DM, CVA (with L sided deficits), depression, seizure disorder, CKD. CT negative for acute findings.    Clinical Impression  Pt easily woken at PT entering room, oriented to self, place, (thought it was March), knew it was a Monday. The patient reported at baseline he uses a cane and that his wife helps him with getting up/walking, though unclear about how much assistance is truly needed.   The patient demonstrated bed mobility with handheld assist, he motioned that he is usually able to get up with momentum. Sit <> stand without physical assist pt unable to clear buttocks, with minA and handheld assist transferred to standing. The patient ambulated ~59ft with CGA and handheld assist. Pt exhibited difficulty navigating environment, especially with obstacles to his L. Did bump LUE into walls twice as well. Pt endorsed blurry vision since diabetic retinopathy.    Follow Up Recommendations Home health PT;Supervision for mobility/OOB    Equipment Recommendations  None recommended by PT    Recommendations for Other Services       Precautions / Restrictions Precautions Precautions: Fall Precaution Comments: seizures Restrictions Weight Bearing Restrictions: No      Mobility  Bed Mobility Overal bed mobility: Needs Assistance Bed Mobility: Supine to Sit     Supine to sit: Min assist;HOB elevated        Transfers Overall transfer level: Needs assistance Equipment used: 1 person hand held assist Transfers: Sit to/from Stand Sit to Stand: Min assist            Ambulation/Gait Ambulation/Gait assistance: Min assist;Min  guard Gait Distance (Feet): 35 Feet Assistive device: 1 person hand held assist       General Gait Details: Pt utilizes SPC at baseline, handheld assist provided, as well as CGA. Pt exhibited difficulty navigatin environment, especially with obstacles to his L. Did bump LUE into walls twice as well. Pt endorsed blurry vision since diabetic retinopathy.  Stairs            Wheelchair Mobility    Modified Rankin (Stroke Patients Only)       Balance Overall balance assessment: Needs assistance Sitting-balance support: Feet supported Sitting balance-Leahy Scale: Fair     Standing balance support: Single extremity supported Standing balance-Leahy Scale: Fair                               Pertinent Vitals/Pain Pain Assessment: No/denies pain    Home Living Family/patient expects to be discharged to:: Private residence Living Arrangements: Spouse/significant other Available Help at Discharge: Family Type of Home: Apartment Home Access: Level entry     Home Layout: One level Home Equipment: Cane - single point Additional Comments: pt reported a WC had been ordered    Prior Function Level of Independence: Needs assistance   Gait / Transfers Assistance Needed: states that his wife assists with mobility  ADL's / Homemaking Assistance Needed: states that his wife assists with all ADLs        Hand Dominance   Dominant Hand: Right    Extremity/Trunk Assessment   Upper Extremity Assessment Upper  Extremity Assessment: RUE deficits/detail;LUE deficits/detail RUE Deficits / Details: grossly 4/5 LUE Deficits / Details: muscle contraction noted for shoulder elevation, AAROM to PROM to reach 90deg, unable to flex/extend wrist/elbow    Lower Extremity Assessment Lower Extremity Assessment: RLE deficits/detail;LLE deficits/detail RLE Deficits / Details: hip flexion 2+/5, knee extension 4/5, ankle DF 4-/5, PF 4/5 LLE Deficits / Details: hip flexion 2+/5,  knee extension 4/5, ankle DF 3+/5, 3+/5 PF    Cervical / Trunk Assessment Cervical / Trunk Assessment: (some weight shift noted to L in sitting)  Communication   Communication: Other (comment)(some garbled speech)  Cognition Arousal/Alertness: Awake/alert Behavior During Therapy: WFL for tasks assessed/performed Overall Cognitive Status: Within Functional Limits for tasks assessed                                        General Comments      Exercises     Assessment/Plan    PT Assessment Patient needs continued PT services  PT Problem List Decreased strength;Decreased mobility;Decreased safety awareness;Decreased activity tolerance;Decreased balance       PT Treatment Interventions DME instruction;Therapeutic exercise;Gait training;Balance training;Neuromuscular re-education;Functional mobility training;Therapeutic activities;Patient/family education    PT Goals (Current goals can be found in the Care Plan section)  Acute Rehab PT Goals Patient Stated Goal: to go home PT Goal Formulation: With patient Time For Goal Achievement: 07/27/19 Potential to Achieve Goals: Good    Frequency Min 2X/week   Barriers to discharge        Co-evaluation               AM-PAC PT "6 Clicks" Mobility  Outcome Measure Help needed turning from your back to your side while in a flat bed without using bedrails?: A Little Help needed moving from lying on your back to sitting on the side of a flat bed without using bedrails?: A Little Help needed moving to and from a bed to a chair (including a wheelchair)?: A Little Help needed standing up from a chair using your arms (e.g., wheelchair or bedside chair)?: A Little Help needed to walk in hospital room?: A Little Help needed climbing 3-5 steps with a railing? : A Little 6 Click Score: 18    End of Session Equipment Utilized During Treatment: Gait belt Activity Tolerance: Patient tolerated treatment well Patient left:  with chair alarm set;in chair;with call bell/phone within reach Nurse Communication: Mobility status PT Visit Diagnosis: Muscle weakness (generalized) (M62.81);Other abnormalities of gait and mobility (R26.89)    Time: [id number] PT Time Calculation (min) (ACUTE ONLY): 23 min   Charges:   PT Evaluation $PT Eval Low Complexity: 1 Low PT Treatments $Therapeutic Exercise: 8-22 mins       Lieutenant Diego PT, DPT 12:08 PM,07/13/19

## 2019-07-13 NOTE — TOC Transition Note (Signed)
Transition of Care (TOC) - CM/SW Discharge Note   Patient Details  Name: Daniel [name] MRN: [id number] Date of Birth: 12/30/1955  Transition of Care (TOC) CM/SW Contact:   J , RN Phone Number: 07/13/2019, 12:59 PM   Clinical Narrative:     Met with the patient to discuss DC plan and needs, he has a RW, a cane, WC amd rollator at home and has [facility] for Rn, I contacted Cory with [facility] and asked to add PT, the patient stated that he does not have any additonal needs  Final next level of care: Home w Home Health Services Barriers to Discharge: Barriers Resolved   Patient Goals and CMS Choice Patient states their goals for this hospitalization and ongoing recovery are:: go home      Discharge Placement                       Discharge Plan and Services   Discharge Planning Services: CM Consult            DME Arranged: N/A         HH Arranged: PT HH Agency: [facility] Date HH Agency Contacted: 07/13/19 Time HH Agency Contacted: [id number] Representative spoke with at HH Agency: Cory  Social Determinants of Health (SDOH) Interventions     Readmission Risk Interventions No flowsheet data found.     

## 2019-07-13 NOTE — Progress Notes (Signed)
D: Pt alert and oriented x 4. Pt denies experiencing any pain at this time. Pt observed being drowsy today. Pt states did not sleep well last night. Pt's family has called numerous times to check on pt's progress. Pt's spouse was upset that she was not notified about pt not discharging today and ask if pt will be discharging tomorrow. It was explained that this writer does not know when pt will discharge, it will be the decision of the MD according to their overall assessment of the pt's well being.   A: Scheduled medications administered to pt, per MD orders. Support and encouragement provided. Frequent verbal contact made. Routine safety checks conducted q15 minutes.   R: No adverse drug reactions noted. Pt complaint with medications and treatment plan. Pt interacts well with staff on the unit. Pt is stable at this time, will continue to monitor and provide care for as ordered.

## 2019-07-13 NOTE — Progress Notes (Signed)
[facility] Progress Note  Patient: Daniel [name]    [id number]  DOA: 07/12/2019     Date of Service: the patient was seen and examined on 07/13/2019  Chief Complaint  Patient presents with  . Seizures   Brief hospital course: HTN, HLD, type II DM, CVA, depression, seizure disorder, CKD 3B, BPH.  Patient presented with 3 episodes of seizures.  Prior to this his last seizure was about a month ago.  Patient was recently hospitalized for DKA. Currently further plan is monitor renal function.  Assessment and Plan: 1.  Recurrent seizures CT head negative for any acute abnormality. No focal deficit. Neurology was consulted. Patient Lamictal dose was increased from 150 mg to 200 mg twice daily. Continue as needed IV Ativan for seizures.  2.  Essential hypertension Orthostatic hypotension Blood pressure is soft. We will discontinue lisinopril HCTZ combination. Provide IV hydration after IV bolus.  3.  History of CVA Chronic anemia Continue Lipitor. Not on antiplatelet medication likely from his recent GI bleed.  4.  Chronic kidney disease stage IIIb Baseline renal function around 1.8. On presentation serum creatinine 1.74. Currently trending up to 1.90. Monitor.  5.  Type 2 diabetes mellitus with chronic kidney disease with hypoglycemia Recent A1c 14.9.  Poorly controlled. On Metformin at home.  As well as Lantus. Continue Lantus.  6.  BPH Continue Flomax  Diet: carb modified diet DVT Prophylaxis: Subcutaneous Heparin    Advance goals of care discussion: Full code  Family Communication: no family was present at bedside, at the time of interview.   Disposition:  Pt is from home, admitted with seizures, still has orthostatic drops, which precludes a safe discharge. Discharge to home, when tomorrow.  Subjective: feeling dizzy, no chest pain, no abdominal pain, no fever and no diarrhea  Physical Exam: General: alert oriented to time, place, and person.    Appear in mild distress, affect appropriate Eyes: PERRL ENT: Oral Mucosa Clear, moist  Neck: no JVD Cardiovascular: S1 and S2 Present, no Murmur Respiratory: good respiratory effort, Bilateral Air entry equal and Decreased, no Crackles, no wheezes Abdomen: Bowel Sound present, Soft and no tenderness Skin: no rash Extremities: no Pedal edema, no calf tenderness Neurologic: without any new focal findings  Gait not checked due to patient safety concerns  Vitals:   07/12/19 1802 07/13/19 0032 07/13/19 0825 07/13/19 0848  BP: 120/83 97/64 95/60  (!) 118/101  Pulse: 79 91 (!) 101 94  Resp:  20 18   Temp: 98.3 F (36.8 C) 98.8 F (37.1 C) 98.9 F (37.2 C)   TempSrc: Oral Oral    SpO2: 100% 94% 96% 99%  Weight:      Height:        Intake/Output Summary (Last 24 hours) at 07/13/2019 0853 Last data filed at 07/13/2019 0144 Gross per 24 hour  Intake 572 ml  Output 450 ml  Net 122 ml   Filed Weights   07/12/19 1049 07/12/19 1759  Weight: 90.7 kg 96.8 kg    Data Reviewed: I have personally reviewed and interpreted daily labs, tele strips, imagings as discussed above. I reviewed all nursing notes, pharmacy notes, vitals, pertinent old records I have discussed plan of care as described above with RN and patient/family.  CBC: Recent Labs  Lab 07/12/19 1044 07/13/19 0244  WBC 5.3 5.0  HGB 10.8* 10.0*  HCT 34.2* 31.3*  MCV 83.6 83.9  PLT 170 [id number]   Basic Metabolic Panel: Recent Labs  Lab 07/12/19 1044 07/13/19 0244  NA 138 138  K 3.8 3.6  CL 102 103  CO2 23 25  GLUCOSE 111* 112*  BUN 16 23  CREATININE 1.74* 1.90*  CALCIUM 9.4 9.4    Studies: CT Head Wo Contrast  Result Date: 07/12/2019 CLINICAL DATA:  Pt via EMS from home. Pt had three possible seizures this am. Per EMS, pt was on the floor and post-ictal upon arrival. Pt is currently AANDOx4 upon arrival. Pt has a hx of seizures and stroke that left him with L sided deficits. EXAM: CT HEAD WITHOUT CONTRAST  TECHNIQUE: Contiguous axial images were obtained from the base of the skull through the vertex without intravenous contrast. COMPARISON:  CT head 11/30/2018 FINDINGS: Brain: Remote moderate to large right MCA infarct. Periventricular white matter hypodensity consistent with chronic small vessel ischemic change. No evidence of acute infarction, hemorrhage, hydrocephalus, extra-axial collection or mass lesion. Vascular: No hyperdense vessel or unexpected calcification. Skull: Normal. Negative for fracture or focal lesion. Sinuses/Orbits: No acute finding. Other: None. IMPRESSION: 1. No acute intracranial abnormality. 2. Remote moderate to large right MCA infarct. Electronically Signed   By: Audie Pinto M.D.   On: 07/12/2019 11:52    Scheduled Meds: . atorvastatin  40 mg Oral Daily  . DULoxetine  30 mg Oral Daily  . DULoxetine  60 mg Oral Daily  . enoxaparin (LOVENOX) injection  40 mg Subcutaneous Q24H  . gabapentin  600 mg Oral TID  . influenza vac split quadrivalent PF  0.5 mL Intramuscular Tomorrow-1000  . insulin aspart  0-9 Units Subcutaneous Q4H  . insulin glargine  18 Units Subcutaneous QHS  . lamoTRIgine  200 mg Oral BID  . nortriptyline  50 mg Oral QHS  . pneumococcal 23 valent vaccine  0.5 mL Intramuscular Tomorrow-1000  . tamsulosin  0.4 mg Oral Daily   Continuous Infusions: PRN Meds: acetaminophen, hydrALAZINE, LORazepam, ondansetron (ZOFRAN) IV  Time spent: 35 minutes  Author: Berle Mull, MD [facility] 07/13/2019 8:53 AM  To reach On-call, see care teams to locate the attending and reach out to them via [email or web address]. If 7PM-7AM, please contact night-coverage If you still have difficulty reaching the attending provider, please page the [facility] (Director on Call) for [facility] on amion for assistance.

## 2019-07-13 NOTE — Plan of Care (Signed)

## 2019-07-14 DIAGNOSIS — Z23 Encounter for immunization: Secondary | ICD-10-CM | POA: Diagnosis not present

## 2019-07-14 LAB — CBC WITH DIFFERENTIAL/PLATELET
Abs Immature Granulocytes: 0 10*3/uL (ref 0.00–0.07)
Basophils Absolute: 0 10*3/uL (ref 0.0–0.1)
Basophils Relative: 1 %
Eosinophils Absolute: 0.2 10*3/uL (ref 0.0–0.5)
Eosinophils Relative: 4 %
HCT: 31.6 % — ABNORMAL LOW (ref 39.0–52.0)
Hemoglobin: 10.1 g/dL — ABNORMAL LOW (ref 13.0–17.0)
Immature Granulocytes: 0 %
Lymphocytes Relative: 37 %
Lymphs Abs: 1.5 10*3/uL (ref 0.7–4.0)
MCH: 26.7 pg (ref 26.0–34.0)
MCHC: 32 g/dL (ref 30.0–36.0)
MCV: 83.6 fL (ref 80.0–100.0)
Monocytes Absolute: 0.3 10*3/uL (ref 0.1–1.0)
Monocytes Relative: 8 %
Neutro Abs: 2 10*3/uL (ref 1.7–7.7)
Neutrophils Relative %: 50 %
Platelets: 157 10*3/uL (ref 150–400)
RBC: 3.78 MIL/uL — ABNORMAL LOW (ref 4.22–5.81)
RDW: 14.2 % (ref 11.5–15.5)
WBC: 4 10*3/uL (ref 4.0–10.5)
nRBC: 0 % (ref 0.0–0.2)

## 2019-07-14 LAB — COMPREHENSIVE METABOLIC PANEL
ALT: 61 U/L — ABNORMAL HIGH (ref 0–44)
AST: 22 U/L (ref 15–41)
Albumin: 3.5 g/dL (ref 3.5–5.0)
Alkaline Phosphatase: 52 U/L (ref 38–126)
Anion gap: 10 (ref 5–15)
BUN: 23 mg/dL (ref 8–23)
CO2: 24 mmol/L (ref 22–32)
Calcium: 8.6 mg/dL — ABNORMAL LOW (ref 8.9–10.3)
Chloride: 105 mmol/L (ref 98–111)
Creatinine, Ser: 1.87 mg/dL — ABNORMAL HIGH (ref 0.61–1.24)
GFR calc Af Amer: 43 mL/min — ABNORMAL LOW (ref >60)
GFR calc non Af Amer: 37 mL/min — ABNORMAL LOW (ref >60)
Glucose, Bld: 86 mg/dL (ref 70–99)
Potassium: 3.8 mmol/L (ref 3.5–5.1)
Sodium: 139 mmol/L (ref 135–145)
Total Bilirubin: 1 mg/dL (ref 0.3–1.2)
Total Protein: 7 g/dL (ref 6.5–8.1)

## 2019-07-14 LAB — GLUCOSE, CAPILLARY
Glucose-Capillary: 96 mg/dL (ref 70–99)
Glucose-Capillary: 87 mg/dL (ref 70–99)
Glucose-Capillary: 111 mg/dL — ABNORMAL HIGH (ref 70–99)

## 2019-07-14 LAB — MAGNESIUM: Magnesium: 1.9 mg/dL (ref 1.7–2.4)

## 2019-07-14 NOTE — Progress Notes (Signed)
D: Pt alert and oriented. Pt denies experiencing any pain at this time.   A: Pt received discharge and medication education/information. Pt belongings were gathered and taken with pt upon discharge.   R: Pt verbalized understanding of discharge and medication education/information.  Pt escorted to medical mall front lobby via wheelchair by staff where wife picked pt upon.

## 2019-07-14 NOTE — TOC Transition Note (Cosign Needed)
Transition of Care [facility]) - CM/SW Discharge Note   Patient Details  Name: Daniel [name] MRN: [id number] Date of Birth: 05/27/1956  Transition of Care [facility]) CM/SW Contact:  Su Hilt, RN Phone Number: 07/14/2019, 12:01 PM   Clinical Narrative:    The patient to DC home with [facility] and continue with RN, Sharlyn Bologna patient does not need DME at home  States he has no additional needs   Final next level of care: Home w Home Health Services Barriers to Discharge: Barriers Resolved   Patient Goals and CMS Choice Patient states their goals for this hospitalization and ongoing recovery are:: go home      Discharge Placement                       Discharge Plan and Services   Discharge Planning Services: CM Consult            DME Arranged: N/A         HH Arranged: PT HH Agency: [facility] Date Power: 07/13/19 Time [place]: [id number] Representative spoke with at [place]: [place] ([place]) Interventions     Readmission Risk Interventions No flowsheet data found.

## 2019-07-14 NOTE — Discharge Summary (Signed)
Physician Discharge Summary   Daniel [name]  male DOB: [birth year]  [id number]  PCP: Kirk Ruths, MD  Admit date: 07/12/2019 Discharge date: 07/14/2019  Admitted From: home Disposition:  home Home Health: Yes CODE STATUS: Full code  Discharge Instructions    Diet - low sodium heart healthy   Complete by: As directed    Diet - low sodium heart healthy   Complete by: As directed    Discharge instructions   Complete by: As directed    It is important that you read the given instructions as well as go over your medication list with RN to help you understand your care after this hospitalization.  Please follow-up with PCP in 1-2 weeks.  Please note that NO REFILLS for any discharge medications will be authorized once you are discharged, as it is imperative that you return to your primary care physician (or establish a relationship with a primary care physician if you do not have one) for your aftercare needs so that they can reassess your need for medications and monitor your lab values.  Please request your primary care physician to go over all Hospital Tests and Procedure/Radiological results at the follow up. Please get all Hospital records sent to your PCP by signing hospital release before you go home.   Do not drive, operating heavy machinery, perform activities at heights, swimming or participation in water activities or provide baby sitting services because you were admitted for seizures; until you have been seen by Primary Care Physician or a Neurologist and are cleared to do such activities.  Do not take more than prescribed Pain, Sleep and Anxiety Medications.  You were cared for by a hospitalist during your hospital stay. If you have any questions about your discharge medications or the care you received while you were in the hospital after you are discharged, you can call the unit _0 @ you were admitted to and ask to speak with the hospitalist Berle Mull.  Ask for Hospitalist on call if the hospitalist that took care of you is not available.   Once you are discharged, your primary care physician will handle any further medical issues.  You Must read complete instructions/literature along with all the possible adverse reactions/side effects for all the Medicines you take and that have been prescribed to you. Take any new Medicines after you have completely understood and accept all the possible adverse reactions/side effects.  If you have smoked or chewed Tobacco in the last 2 yrs please STOP smoking STOP any Recreational drug use.  If you drink alcohol, please safely STOP the use. Do not drive, operating heavy machinery, perform activities at heights, swimming or participation in water activities or provide baby sitting services under influence.  Wear Seat belts while driving.   Driving Restrictions   Complete by: As directed    No driving.   Increase activity slowly   Complete by: As directed    Increase activity slowly   Complete by: As directed    Other Restrictions   Complete by: As directed    Per [facility] statutes, patients with seizures are not allowed to drive until  they have been seizure-free for six months.  Use caution when using heavy equipment or power tools.  Avoid working on ladders or at heights.  Take showers instead of baths. Ensure the water temperature is not too high on the home water heater. Do not go swimming alone.  When caring for infants or small children,  sit down when holding, feeding, or changing them to minimize risk of injury to the child in the event you have a seizure.  Maintain good sleep hygiene. Avoid alcohol.       Hospital Course:  For full details, please see H&P, progress notes, consult notes and ancillary notes.  Briefly,  Daniel [name] is a 64 y.o. with hx of HTN, HLD, type II DM, CVA, depression, seizure disorder, CKD 3B, BPH.  Patient presented with 3 episodes of seizures.  Prior  to this his last seizure was about a month ago.  Patient was recently hospitalized for DKA.  1.  Recurrent seizures CT head negative for any acute abnormality. No focal deficit. Neurology was consulted. Patient Lamictal dose was increased from 150 mg to 200 mg twice daily.  2.  Essential hypertension Orthostatic hypotension Blood pressure is soft. We have discontinued lisinopril HCTZ combination.  3.  History of CVA Chronic anemia Continue Lipitor. Not on antiplatelet medication likely from his recent GI bleed.  4.  Chronic kidney disease stage IIIb Baseline renal function around 1.8. On presentation serum creatinine 1.74.  5.  Type 2 diabetes mellitus with chronic kidney disease with hypoglycemia Recent A1c 14.9.  Poorly controlled. On Metformin at home.  As well as Lantus.  Continue home Lantus.  6.  BPH Continue Flomax   Discharge Diagnoses:  Principal Problem:   Seizure ([place]) Active Problems:   HTN (hypertension)   Stroke (HCC)   Depression   BPH (benign prostatic hyperplasia)   HLD (hyperlipidemia)   Type II diabetes mellitus with renal manifestations (HCC)   CKD (chronic kidney disease), stage IIIb   Recurrent seizures ([place])    Discharge Instructions:  Allergies as of 07/14/2019   No Known Allergies     Medication List    STOP taking these medications   lisinopril-hydrochlorothiazide 20-25 MG tablet Commonly known as: ZESTORETIC     TAKE these medications   atorvastatin 40 MG tablet Commonly known as: LIPITOR Take 40 mg by mouth daily.   blood glucose meter kit and supplies Kit Dispense based on patient and insurance preference. Use up to four times daily as directed. (FOR ICD-9 250.00, 250.01).   DULoxetine 30 MG capsule Commonly known as: CYMBALTA Take 30 mg by mouth daily. Take with 60 mg   DULoxetine 60 MG capsule Commonly known as: CYMBALTA Take 60 mg by mouth daily. Takes with 30 mg   gabapentin 300 MG capsule Commonly known  as: NEURONTIN Take 600 mg by mouth 3 (three) times daily.   insulin glargine 100 UNIT/ML injection Commonly known as: LANTUS Inject 0.2 mLs (20 Units total) into the skin at bedtime. What changed: how much to take   lamoTRIgine 200 MG tablet Commonly known as: LAMICTAL Take 1 tablet (200 mg total) by mouth 2 (two) times daily. What changed:   medication strength  how much to take  when to take this   metFORMIN 500 MG tablet Commonly known as: GLUCOPHAGE Take 500 mg by mouth 2 (two) times daily.   nortriptyline 50 MG capsule Commonly known as: PAMELOR Take 50 mg by mouth at bedtime.   tamsulosin 0.4 MG Caps capsule Commonly known as: Flomax Take 1 capsule (0.4 mg total) by mouth daily.            Durable Medical Equipment  (From admission, onward)         Start     Ordered   07/13/19 1220  For home use only DME Gilford Rile  rolling  Once    Question Answer Comment  Walker: With [place]   Patient needs a walker to treat with the following condition CVA (cerebral vascular accident) [facility])      07/13/19 1220          Follow-up Information    Kirk Ruths, MD. Schedule an appointment as soon as possible for a visit on 07/16/2019.   Specialty: Internal Medicine Why: @ 3:45 pm Contact information: [facility] [place] [address] [phone]           No Known Allergies   The results of significant diagnostics from this hospitalization (including imaging, microbiology, ancillary and laboratory) are listed below for reference.   Consultations:   Procedures/Studies: CT Head Wo Contrast  Result Date: 07/12/2019 CLINICAL DATA:  Pt via EMS from home. Pt had three possible seizures this am. Per EMS, pt was on the floor and post-ictal upon arrival. Pt is currently AANDOx4 upon arrival. Pt has a hx of seizures and stroke that left him with L sided deficits. EXAM: CT HEAD WITHOUT CONTRAST TECHNIQUE: Contiguous  axial images were obtained from the base of the skull through the vertex without intravenous contrast. COMPARISON:  CT head 11/30/2018 FINDINGS: Brain: Remote moderate to large right MCA infarct. Periventricular white matter hypodensity consistent with chronic small vessel ischemic change. No evidence of acute infarction, hemorrhage, hydrocephalus, extra-axial collection or mass lesion. Vascular: No hyperdense vessel or unexpected calcification. Skull: Normal. Negative for fracture or focal lesion. Sinuses/Orbits: No acute finding. Other: None. IMPRESSION: 1. No acute intracranial abnormality. 2. Remote moderate to large right MCA infarct. Electronically Signed   By: Audie Pinto M.D.   On: 07/12/2019 11:52      Labs: BNP (last 3 results) No results for input(s): BNP in the last 8760 hours. Basic Metabolic Panel: Recent Labs  Lab 07/12/19 1044 07/13/19 0244 07/14/19 0637  NA 138 138 139  K 3.8 3.6 3.8  CL 102 103 105  CO2 _0 GLUCOSE 111* 112* 86  BUN _1 CREATININE 1.74* 1.90* 1.87*  CALCIUM 9.4 9.4 8.6*  MG  --   --  1.9   Liver Function Tests: Recent Labs  Lab 07/14/19 0637  AST 22  ALT 61*  ALKPHOS 52  BILITOT 1.0  PROT 7.0  ALBUMIN 3.5   No results for input(s): LIPASE, AMYLASE in the last 168 hours. No results for input(s): AMMONIA in the last 168 hours. CBC: Recent Labs  Lab 07/12/19 1044 07/13/19 0244 07/14/19 0637  WBC 5.3 5.0 4.0  NEUTROABS  --   --  2.0  HGB 10.8* 10.0* 10.1*  HCT 34.2* 31.3* 31.6*  MCV 83.6 83.9 83.6  PLT 170 183 157   Cardiac Enzymes: No results for input(s): CKTOTAL, CKMB, CKMBINDEX, TROPONINI in the last 168 hours. BNP: Invalid input(s): POCBNP CBG: Recent Labs  Lab 07/13/19 1657 07/13/19 2122 07/14/19 0419 07/14/19 0804 07/14/19 1134  GLUCAP 69* 108* 96 87 111*   D-Dimer No results for input(s): DDIMER in the last 72 hours. Hgb A1c No results for input(s): HGBA1C in the last 72 hours. Lipid  Profile No results for input(s): CHOL, HDL, LDLCALC, TRIG, CHOLHDL, LDLDIRECT in the last 72 hours. Thyroid function studies No results for input(s): TSH, T4TOTAL, T3FREE, THYROIDAB in the last 72 hours.  Invalid input(s): FREET3 Anemia work up No results for input(s): VITAMINB12, FOLATE, FERRITIN, TIBC, IRON, RETICCTPCT in the last 5  hours. Urinalysis    Component Value Date/Time   COLORURINE YELLOW (A) 07/12/2019 1210   APPEARANCEUR CLEAR (A) 07/12/2019 1210   APPEARANCEUR Clear 10/04/2013 1949   LABSPEC 1.015 07/12/2019 1210   LABSPEC 1.057 10/04/2013 1949   PHURINE 6.0 07/12/2019 1210   GLUCOSEU NEGATIVE 07/12/2019 1210   GLUCOSEU Negative 10/04/2013 1949   HGBUR SMALL (A) 07/12/2019 1210   BILIRUBINUR NEGATIVE 07/12/2019 1210   BILIRUBINUR Negative 10/04/2013 1949   KETONESUR NEGATIVE 07/12/2019 1210   PROTEINUR 30 (A) 07/12/2019 1210   NITRITE NEGATIVE 07/12/2019 1210   LEUKOCYTESUR NEGATIVE 07/12/2019 1210   LEUKOCYTESUR Negative 10/04/2013 1949   Sepsis Labs Invalid input(s): PROCALCITONIN,  WBC,  LACTICIDVEN Microbiology Recent Results (from the past 240 hour(s))  SARS CORONAVIRUS 2 (TAT 6-24 HRS) Nasopharyngeal Nasopharyngeal Swab     Status: None   Collection Time: 07/12/19  1:09 PM   Specimen: Nasopharyngeal Swab  Result Value Ref Range Status   SARS Coronavirus 2 NEGATIVE NEGATIVE Final    Comment: (NOTE) SARS-CoV-2 target nucleic acids are NOT DETECTED. The SARS-CoV-2 RNA is generally detectable in upper and lower respiratory specimens during the acute phase of infection. Negative results do not preclude SARS-CoV-2 infection, do not rule out co-infections with other pathogens, and should not be used as the sole basis for treatment or other patient management decisions. Negative results must be combined with clinical observations, patient history, and epidemiological information. The expected result is Negative. Fact Sheet for  Patients: SugarRoll.be Fact Sheet for Healthcare Providers: https://www.woods-mathews.com/ This test is not yet approved or cleared by the [place] FDA and  has been authorized for detection and/or diagnosis of SARS-CoV-2 by FDA under an Emergency Use Authorization (EUA). This EUA will remain  in effect (meaning this test can be used) for the duration of the COVID-19 declaration under Section 56 4(b)(1) of the Act, 21 U.S.C. section 360bbb-3(b)(1), unless the authorization is terminated or revoked sooner. Performed at [facility], [place] [address] [place], [place] [address]      Total time spend on discharging this patient, including the last patient exam, discussing the hospital stay, instructions for ongoing care as it relates to all pertinent caregivers, as well as preparing the medical discharge records, prescriptions, and/or referrals as applicable, is 30 minutes.    Enzo Bi, MD  [facility] 07/14/2019, 11:55 AM  If 7PM-7AM, please contact night-coverage

## 2019-07-15 ENCOUNTER — Other Ambulatory Visit: Payer: Self-pay

## 2019-07-15 ENCOUNTER — Ambulatory Visit: Admission: RE | Admit: 2019-07-15 | Payer: Medicare HMO | Source: Home / Self Care | Admitting: Internal Medicine

## 2019-07-15 ENCOUNTER — Encounter: Admission: RE | Payer: Self-pay | Source: Home / Self Care

## 2019-07-15 DIAGNOSIS — N1832 Chronic kidney disease, stage 3b: Secondary | ICD-10-CM

## 2019-07-15 SURGERY — COLONOSCOPY WITH PROPOFOL
Anesthesia: General

## 2019-07-16 ENCOUNTER — Other Ambulatory Visit: Payer: Self-pay

## 2019-07-16 NOTE — Patient Outreach (Signed)
[facility]) Care Management  07/16/2019  Daniel [name] 07/11/1955 [id number]   Telephone call to patient. He reports he is making it.  Patient able to verify HIPAA.  He then asks for CM to speak with his wife Daniel [name].  She states that patient is doing ok and that he has home health ordered and waiting for a call from them as he had them previously.  She states she has so many people calling and asking questions. She states she has talked with the MD office and waiting for call back for follow up appointment.  She also states that she also received a call from [facility] as well.    Discussed [facility] services and support.  Wife declines further nurse follow up or other services at this time as too much going on.  Advised CM would send letter she is agreeable.    Plan: RN CM will send letter and close case.    [name], RN, MSN [facility] Care Management Coordinator Direct Line [phone] Cell [phone] Toll Free: [phone]  Fax: [phone]

## 2019-07-17 DIAGNOSIS — E1165 Type 2 diabetes mellitus with hyperglycemia: Secondary | ICD-10-CM | POA: Diagnosis not present

## 2019-07-17 DIAGNOSIS — N1832 Chronic kidney disease, stage 3b: Secondary | ICD-10-CM | POA: Diagnosis not present

## 2019-07-17 DIAGNOSIS — I129 Hypertensive chronic kidney disease with stage 1 through stage 4 chronic kidney disease, or unspecified chronic kidney disease: Secondary | ICD-10-CM | POA: Diagnosis not present

## 2019-07-17 DIAGNOSIS — E1142 Type 2 diabetes mellitus with diabetic polyneuropathy: Secondary | ICD-10-CM | POA: Diagnosis not present

## 2019-07-17 DIAGNOSIS — E1122 Type 2 diabetes mellitus with diabetic chronic kidney disease: Secondary | ICD-10-CM | POA: Diagnosis not present

## 2019-07-17 DIAGNOSIS — D631 Anemia in chronic kidney disease: Secondary | ICD-10-CM | POA: Diagnosis not present

## 2019-07-17 DIAGNOSIS — F0151 Vascular dementia with behavioral disturbance: Secondary | ICD-10-CM | POA: Diagnosis not present

## 2019-07-17 DIAGNOSIS — G40909 Epilepsy, unspecified, not intractable, without status epilepticus: Secondary | ICD-10-CM | POA: Diagnosis not present

## 2019-07-17 DIAGNOSIS — I69354 Hemiplegia and hemiparesis following cerebral infarction affecting left non-dominant side: Secondary | ICD-10-CM | POA: Diagnosis not present

## 2019-07-18 DIAGNOSIS — I129 Hypertensive chronic kidney disease with stage 1 through stage 4 chronic kidney disease, or unspecified chronic kidney disease: Secondary | ICD-10-CM | POA: Diagnosis not present

## 2019-07-18 DIAGNOSIS — E1122 Type 2 diabetes mellitus with diabetic chronic kidney disease: Secondary | ICD-10-CM | POA: Diagnosis not present

## 2019-07-18 DIAGNOSIS — G40909 Epilepsy, unspecified, not intractable, without status epilepticus: Secondary | ICD-10-CM | POA: Diagnosis not present

## 2019-07-18 DIAGNOSIS — E1142 Type 2 diabetes mellitus with diabetic polyneuropathy: Secondary | ICD-10-CM | POA: Diagnosis not present

## 2019-07-18 DIAGNOSIS — N1832 Chronic kidney disease, stage 3b: Secondary | ICD-10-CM | POA: Diagnosis not present

## 2019-07-18 DIAGNOSIS — F0151 Vascular dementia with behavioral disturbance: Secondary | ICD-10-CM | POA: Diagnosis not present

## 2019-07-18 DIAGNOSIS — I69354 Hemiplegia and hemiparesis following cerebral infarction affecting left non-dominant side: Secondary | ICD-10-CM | POA: Diagnosis not present

## 2019-07-18 DIAGNOSIS — D631 Anemia in chronic kidney disease: Secondary | ICD-10-CM | POA: Diagnosis not present

## 2019-07-18 DIAGNOSIS — F419 Anxiety disorder, unspecified: Secondary | ICD-10-CM | POA: Diagnosis not present

## 2019-07-20 DIAGNOSIS — D631 Anemia in chronic kidney disease: Secondary | ICD-10-CM | POA: Diagnosis not present

## 2019-07-20 DIAGNOSIS — N1832 Chronic kidney disease, stage 3b: Secondary | ICD-10-CM | POA: Diagnosis not present

## 2019-07-20 DIAGNOSIS — I69354 Hemiplegia and hemiparesis following cerebral infarction affecting left non-dominant side: Secondary | ICD-10-CM | POA: Diagnosis not present

## 2019-07-20 DIAGNOSIS — F419 Anxiety disorder, unspecified: Secondary | ICD-10-CM | POA: Diagnosis not present

## 2019-07-20 DIAGNOSIS — E1122 Type 2 diabetes mellitus with diabetic chronic kidney disease: Secondary | ICD-10-CM | POA: Diagnosis not present

## 2019-07-20 DIAGNOSIS — F0151 Vascular dementia with behavioral disturbance: Secondary | ICD-10-CM | POA: Diagnosis not present

## 2019-07-20 DIAGNOSIS — I129 Hypertensive chronic kidney disease with stage 1 through stage 4 chronic kidney disease, or unspecified chronic kidney disease: Secondary | ICD-10-CM | POA: Diagnosis not present

## 2019-07-20 DIAGNOSIS — E1142 Type 2 diabetes mellitus with diabetic polyneuropathy: Secondary | ICD-10-CM | POA: Diagnosis not present

## 2019-07-20 DIAGNOSIS — G40909 Epilepsy, unspecified, not intractable, without status epilepticus: Secondary | ICD-10-CM | POA: Diagnosis not present

## 2019-07-21 DIAGNOSIS — E1122 Type 2 diabetes mellitus with diabetic chronic kidney disease: Secondary | ICD-10-CM | POA: Diagnosis not present

## 2019-07-21 DIAGNOSIS — F0151 Vascular dementia with behavioral disturbance: Secondary | ICD-10-CM | POA: Diagnosis not present

## 2019-07-21 DIAGNOSIS — D631 Anemia in chronic kidney disease: Secondary | ICD-10-CM | POA: Diagnosis not present

## 2019-07-21 DIAGNOSIS — I129 Hypertensive chronic kidney disease with stage 1 through stage 4 chronic kidney disease, or unspecified chronic kidney disease: Secondary | ICD-10-CM | POA: Diagnosis not present

## 2019-07-21 DIAGNOSIS — G40909 Epilepsy, unspecified, not intractable, without status epilepticus: Secondary | ICD-10-CM | POA: Diagnosis not present

## 2019-07-21 DIAGNOSIS — N1832 Chronic kidney disease, stage 3b: Secondary | ICD-10-CM | POA: Diagnosis not present

## 2019-07-21 DIAGNOSIS — I69354 Hemiplegia and hemiparesis following cerebral infarction affecting left non-dominant side: Secondary | ICD-10-CM | POA: Diagnosis not present

## 2019-07-21 DIAGNOSIS — E1142 Type 2 diabetes mellitus with diabetic polyneuropathy: Secondary | ICD-10-CM | POA: Diagnosis not present

## 2019-07-21 DIAGNOSIS — F419 Anxiety disorder, unspecified: Secondary | ICD-10-CM | POA: Diagnosis not present

## 2019-07-23 DIAGNOSIS — I69354 Hemiplegia and hemiparesis following cerebral infarction affecting left non-dominant side: Secondary | ICD-10-CM | POA: Diagnosis not present

## 2019-07-23 DIAGNOSIS — E1122 Type 2 diabetes mellitus with diabetic chronic kidney disease: Secondary | ICD-10-CM | POA: Diagnosis not present

## 2019-07-23 DIAGNOSIS — E1142 Type 2 diabetes mellitus with diabetic polyneuropathy: Secondary | ICD-10-CM | POA: Diagnosis not present

## 2019-07-23 DIAGNOSIS — Z87891 Personal history of nicotine dependence: Secondary | ICD-10-CM | POA: Diagnosis not present

## 2019-07-23 DIAGNOSIS — D631 Anemia in chronic kidney disease: Secondary | ICD-10-CM | POA: Diagnosis not present

## 2019-07-23 DIAGNOSIS — F0151 Vascular dementia with behavioral disturbance: Secondary | ICD-10-CM | POA: Diagnosis not present

## 2019-07-23 DIAGNOSIS — R569 Unspecified convulsions: Secondary | ICD-10-CM | POA: Diagnosis not present

## 2019-07-23 DIAGNOSIS — F419 Anxiety disorder, unspecified: Secondary | ICD-10-CM | POA: Diagnosis not present

## 2019-07-23 DIAGNOSIS — F1020 Alcohol dependence, uncomplicated: Secondary | ICD-10-CM | POA: Diagnosis not present

## 2019-07-23 DIAGNOSIS — Z794 Long term (current) use of insulin: Secondary | ICD-10-CM | POA: Diagnosis not present

## 2019-07-23 DIAGNOSIS — N1832 Chronic kidney disease, stage 3b: Secondary | ICD-10-CM | POA: Diagnosis not present

## 2019-07-23 DIAGNOSIS — G40909 Epilepsy, unspecified, not intractable, without status epilepticus: Secondary | ICD-10-CM | POA: Diagnosis not present

## 2019-07-23 DIAGNOSIS — I129 Hypertensive chronic kidney disease with stage 1 through stage 4 chronic kidney disease, or unspecified chronic kidney disease: Secondary | ICD-10-CM | POA: Diagnosis not present

## 2019-07-28 DIAGNOSIS — D631 Anemia in chronic kidney disease: Secondary | ICD-10-CM | POA: Diagnosis not present

## 2019-07-28 DIAGNOSIS — I69354 Hemiplegia and hemiparesis following cerebral infarction affecting left non-dominant side: Secondary | ICD-10-CM | POA: Diagnosis not present

## 2019-07-28 DIAGNOSIS — E1142 Type 2 diabetes mellitus with diabetic polyneuropathy: Secondary | ICD-10-CM | POA: Diagnosis not present

## 2019-07-28 DIAGNOSIS — I129 Hypertensive chronic kidney disease with stage 1 through stage 4 chronic kidney disease, or unspecified chronic kidney disease: Secondary | ICD-10-CM | POA: Diagnosis not present

## 2019-07-28 DIAGNOSIS — N1832 Chronic kidney disease, stage 3b: Secondary | ICD-10-CM | POA: Diagnosis not present

## 2019-07-28 DIAGNOSIS — F419 Anxiety disorder, unspecified: Secondary | ICD-10-CM | POA: Diagnosis not present

## 2019-07-28 DIAGNOSIS — G40909 Epilepsy, unspecified, not intractable, without status epilepticus: Secondary | ICD-10-CM | POA: Diagnosis not present

## 2019-07-28 DIAGNOSIS — E1122 Type 2 diabetes mellitus with diabetic chronic kidney disease: Secondary | ICD-10-CM | POA: Diagnosis not present

## 2019-07-28 DIAGNOSIS — F0151 Vascular dementia with behavioral disturbance: Secondary | ICD-10-CM | POA: Diagnosis not present

## 2019-07-30 DIAGNOSIS — D631 Anemia in chronic kidney disease: Secondary | ICD-10-CM | POA: Diagnosis not present

## 2019-07-30 DIAGNOSIS — I69354 Hemiplegia and hemiparesis following cerebral infarction affecting left non-dominant side: Secondary | ICD-10-CM | POA: Diagnosis not present

## 2019-07-30 DIAGNOSIS — E1142 Type 2 diabetes mellitus with diabetic polyneuropathy: Secondary | ICD-10-CM | POA: Diagnosis not present

## 2019-07-30 DIAGNOSIS — F419 Anxiety disorder, unspecified: Secondary | ICD-10-CM | POA: Diagnosis not present

## 2019-07-30 DIAGNOSIS — E1122 Type 2 diabetes mellitus with diabetic chronic kidney disease: Secondary | ICD-10-CM | POA: Diagnosis not present

## 2019-07-30 DIAGNOSIS — G40909 Epilepsy, unspecified, not intractable, without status epilepticus: Secondary | ICD-10-CM | POA: Diagnosis not present

## 2019-07-30 DIAGNOSIS — I129 Hypertensive chronic kidney disease with stage 1 through stage 4 chronic kidney disease, or unspecified chronic kidney disease: Secondary | ICD-10-CM | POA: Diagnosis not present

## 2019-07-30 DIAGNOSIS — N1832 Chronic kidney disease, stage 3b: Secondary | ICD-10-CM | POA: Diagnosis not present

## 2019-07-30 DIAGNOSIS — F0151 Vascular dementia with behavioral disturbance: Secondary | ICD-10-CM | POA: Diagnosis not present

## 2019-07-31 DIAGNOSIS — N1832 Chronic kidney disease, stage 3b: Secondary | ICD-10-CM | POA: Diagnosis not present

## 2019-07-31 DIAGNOSIS — F419 Anxiety disorder, unspecified: Secondary | ICD-10-CM | POA: Diagnosis not present

## 2019-07-31 DIAGNOSIS — E1122 Type 2 diabetes mellitus with diabetic chronic kidney disease: Secondary | ICD-10-CM | POA: Diagnosis not present

## 2019-07-31 DIAGNOSIS — E1142 Type 2 diabetes mellitus with diabetic polyneuropathy: Secondary | ICD-10-CM | POA: Diagnosis not present

## 2019-07-31 DIAGNOSIS — G40909 Epilepsy, unspecified, not intractable, without status epilepticus: Secondary | ICD-10-CM | POA: Diagnosis not present

## 2019-07-31 DIAGNOSIS — F0151 Vascular dementia with behavioral disturbance: Secondary | ICD-10-CM | POA: Diagnosis not present

## 2019-07-31 DIAGNOSIS — I129 Hypertensive chronic kidney disease with stage 1 through stage 4 chronic kidney disease, or unspecified chronic kidney disease: Secondary | ICD-10-CM | POA: Diagnosis not present

## 2019-07-31 DIAGNOSIS — I69354 Hemiplegia and hemiparesis following cerebral infarction affecting left non-dominant side: Secondary | ICD-10-CM | POA: Diagnosis not present

## 2019-07-31 DIAGNOSIS — D631 Anemia in chronic kidney disease: Secondary | ICD-10-CM | POA: Diagnosis not present

## 2019-08-04 ENCOUNTER — Encounter: Payer: Medicare HMO | Attending: Internal Medicine | Admitting: *Deleted

## 2019-08-04 ENCOUNTER — Other Ambulatory Visit: Payer: Self-pay

## 2019-08-04 ENCOUNTER — Encounter: Payer: Self-pay | Admitting: *Deleted

## 2019-08-04 VITALS — BP 122/74 | Ht 66.0 in | Wt 218.4 lb

## 2019-08-04 DIAGNOSIS — K625 Hemorrhage of anus and rectum: Secondary | ICD-10-CM | POA: Diagnosis not present

## 2019-08-04 DIAGNOSIS — I69354 Hemiplegia and hemiparesis following cerebral infarction affecting left non-dominant side: Secondary | ICD-10-CM | POA: Diagnosis not present

## 2019-08-04 DIAGNOSIS — E1122 Type 2 diabetes mellitus with diabetic chronic kidney disease: Secondary | ICD-10-CM | POA: Diagnosis not present

## 2019-08-04 DIAGNOSIS — N183 Chronic kidney disease, stage 3 unspecified: Secondary | ICD-10-CM | POA: Insufficient documentation

## 2019-08-04 DIAGNOSIS — N1832 Chronic kidney disease, stage 3b: Secondary | ICD-10-CM | POA: Diagnosis not present

## 2019-08-04 DIAGNOSIS — E119 Type 2 diabetes mellitus without complications: Secondary | ICD-10-CM

## 2019-08-04 DIAGNOSIS — R7401 Elevation of levels of liver transaminase levels: Secondary | ICD-10-CM | POA: Diagnosis not present

## 2019-08-04 DIAGNOSIS — E1142 Type 2 diabetes mellitus with diabetic polyneuropathy: Secondary | ICD-10-CM | POA: Diagnosis not present

## 2019-08-04 DIAGNOSIS — D631 Anemia in chronic kidney disease: Secondary | ICD-10-CM | POA: Diagnosis not present

## 2019-08-04 DIAGNOSIS — F419 Anxiety disorder, unspecified: Secondary | ICD-10-CM | POA: Diagnosis not present

## 2019-08-04 DIAGNOSIS — Z794 Long term (current) use of insulin: Secondary | ICD-10-CM | POA: Insufficient documentation

## 2019-08-04 DIAGNOSIS — G40909 Epilepsy, unspecified, not intractable, without status epilepticus: Secondary | ICD-10-CM | POA: Diagnosis not present

## 2019-08-04 DIAGNOSIS — R1312 Dysphagia, oropharyngeal phase: Secondary | ICD-10-CM | POA: Diagnosis not present

## 2019-08-04 DIAGNOSIS — F0151 Vascular dementia with behavioral disturbance: Secondary | ICD-10-CM | POA: Diagnosis not present

## 2019-08-04 DIAGNOSIS — Z713 Dietary counseling and surveillance: Secondary | ICD-10-CM | POA: Insufficient documentation

## 2019-08-04 DIAGNOSIS — K219 Gastro-esophageal reflux disease without esophagitis: Secondary | ICD-10-CM | POA: Diagnosis not present

## 2019-08-04 DIAGNOSIS — I129 Hypertensive chronic kidney disease with stage 1 through stage 4 chronic kidney disease, or unspecified chronic kidney disease: Secondary | ICD-10-CM | POA: Diagnosis not present

## 2019-08-04 DIAGNOSIS — R198 Other specified symptoms and signs involving the digestive system and abdomen: Secondary | ICD-10-CM | POA: Diagnosis not present

## 2019-08-04 NOTE — Progress Notes (Signed)
Diabetes Self-Management Education  Visit Type: First/Initial  Appt. Start Time: 0900 Appt. End Time: 1005  08/04/2019  [name], identified by name and date of birth, is a 64 y.o. male with a diagnosis of Diabetes: Type 2.   ASSESSMENT  Blood pressure 122/74, height 5\' 6"  (1.676 m), weight 218 lb 6.4 oz (99.1 kg). Body mass index is 35.25 kg/m.  Diabetes Self-Management Education - 08/04/19 1215      Visit Information   Visit Type  First/Initial      Initial Visit   Diabetes Type  Type 2    Are you currently following a meal plan?  No    Are you taking your medications as prescribed?  Yes    Date Diagnosed  6 months ago      Health Coping   How would you rate your overall health?  Fair      Psychosocial Assessment   Patient Belief/Attitude about Diabetes  Motivated to manage diabetes   "aggrevating"   Self-care barriers  Unsteady gait/risk for falls    Self-management support  Doctor's office;Family    Other persons present  Spouse/SO    Patient Concerns  Nutrition/Meal planning;Glycemic Control;Monitoring    Special Needs  None    Preferred Learning Style  Visual;Auditory    Learning Readiness  Change in progress    How often do you need to have someone help you when you read instructions, pamphlets, or other written materials from your doctor or pharmacy?  3 - Sometimes   due to CVA and left sided weakness   What is the last grade level you completed in school?  9th      Pre-Education Assessment   Patient understands the diabetes disease and treatment process.  Needs Review    Patient understands incorporating nutritional management into lifestyle.  Needs Review    Patient undertands incorporating physical activity into lifestyle.  Needs Review    Patient understands using medications safely.  Needs Instruction    Patient understands monitoring blood glucose, interpreting and using results  Needs Review    Patient understands prevention, detection, and  treatment of acute complications.  Needs Instruction    Patient understands prevention, detection, and treatment of chronic complications.  Needs Review    Patient understands how to develop strategies to address psychosocial issues.  Needs Instruction    Patient understands how to develop strategies to promote health/change behavior.  Needs Instruction      Complications   Last HgB A1C per patient/outside source  11.3 %   07/23/2019   How often do you check your blood sugar?  1-2 times/day    Fasting Blood glucose range (mg/dL)  70-129   Wife reports FBG's 80-130's mg/dL   Postprandial Blood glucose range (mg/dL)  70-129;130-179   Wife reports post meal readings less than 180 mg/dL   Have you had a dilated eye exam in the past 12 months?  No    Have you had a dental exam in the past 12 months?  No    Are you checking your feet?  Yes    How many days per week are you checking your feet?  7      Dietary Intake   Breakfast  cereal and milk; eggs, grits, toast    Lunch  cereal and milk; fish with slaw and macaroni salad; peanut butter sandwich    Snack (afternoon)  0-1 snack/day - cheese or peanut butter crackers, fruit (pear, banana, apple)  Dinner  chicken, fish, occasional pork, spaghetti, potatoes, rice, pinto and green beans, greens, salad with lettuce, tomato, cuccumbers    Beverage(s)  diet soda      Exercise   Exercise Type  Light (walking / raking leaves)    How many days per week to you exercise?  6    How many minutes per day do you exercise?  120    Total minutes per week of exercise  720      Patient Education   Previous Diabetes Education  Yes (please comment)   Pt was seen for the 2 Hour Refresher program last year.   Disease state   Factors that contribute to the development of diabetes;Explored patient's options for treatment of their diabetes    Nutrition management   Role of diet in the treatment of diabetes and the relationship between the three main  macronutrients and blood glucose level;Food label reading, portion sizes and measuring food.;Reviewed blood glucose goals for pre and post meals and how to evaluate the patients' food intake on their blood glucose level.;Meal timing in regards to the patients' current diabetes medication.    Physical activity and exercise   Role of exercise on diabetes management, blood pressure control and cardiac health.    Medications  Taught/reviewed insulin injection, site rotation, insulin storage and needle disposal.;Reviewed patients medication for diabetes, action, purpose, timing of dose and side effects.    Monitoring  Purpose and frequency of SMBG.;Taught/discussed recording of test results and interpretation of SMBG.;Identified appropriate SMBG and/or A1C goals.;Yearly dilated eye exam    Acute complications  Taught treatment of hypoglycemia - the 15 rule.;Discussed and identified patients' treatment of hyperglycemia.    Chronic complications  Relationship between chronic complications and blood glucose control      Individualized Goals (developed by patient)   Reducing Risk  Other (comment)   Improve blood sugars; Prevent diabetes complications     Outcomes   Expected Outcomes  Demonstrated interest in learning. Expect positive outcomes    Future DMSE  4-6 wks       Individualized Plan for Diabetes Self-Management Training:   Learning Objective:  Patient will have a greater understanding of diabetes self-management. Patient education plan is to attend individual and/or group sessions per assessed needs and concerns.   Plan:   Patient Instructions  Check blood sugars 2 x day before breakfast and 2 hrs after supper every day Bring blood sugar records to the next appointment Exercise: Continue walking for  120  minutes  6 days a week Eat 3 meals day, 1-2 snacks a day Space meals 4-6 hours apart Include 1 serving of protein with each meal (especially cereal and milk) Make an eye doctor  appointment Carry fast acting glucose and a snack at all times (especially when walking by yourself) Rotate injection sites Return for appointment on:  Tuesday September 01, 2019 at 10:00 am with Freda Munro (nurse)  Expected Outcomes:  Demonstrated interest in learning. Expect positive outcomes  Education material provided:  General Meal Planning Guidelines Simple Meal Plan Glucose tablets Symptoms, causes and treatments of Hypoglycemia and Hyperglycemia  If problems or questions, patient to contact team via:  Johny Drilling, [place], [place], CDE [phone]  Future DSME appointment: 4-6 wks  September 01, 2019 with this nurse. Pt was seen last year for Diabetes education. He didn't want to see a dietitian at this time.

## 2019-08-04 NOTE — Patient Instructions (Addendum)
Check blood sugars 2 x day before breakfast and 2 hrs after supper every day Bring blood sugar records to the next appointment  Exercise: Continue walking for  120  minutes  6 days a week  Eat 3 meals day, 1-2 snacks a day Space meals 4-6 hours apart Include 1 serving of protein with each meal (especially cereal and milk)  Make an eye doctor appointment  Carry fast acting glucose and a snack at all times (especially when walking by yourself) Rotate injection sites  Return for appointment on:  Tuesday September 01, 2019 at 10:00 am with Freda Munro (nurse)

## 2019-08-06 DIAGNOSIS — I69354 Hemiplegia and hemiparesis following cerebral infarction affecting left non-dominant side: Secondary | ICD-10-CM | POA: Diagnosis not present

## 2019-08-06 DIAGNOSIS — F419 Anxiety disorder, unspecified: Secondary | ICD-10-CM | POA: Diagnosis not present

## 2019-08-06 DIAGNOSIS — E1142 Type 2 diabetes mellitus with diabetic polyneuropathy: Secondary | ICD-10-CM | POA: Diagnosis not present

## 2019-08-06 DIAGNOSIS — N1832 Chronic kidney disease, stage 3b: Secondary | ICD-10-CM | POA: Diagnosis not present

## 2019-08-06 DIAGNOSIS — D631 Anemia in chronic kidney disease: Secondary | ICD-10-CM | POA: Diagnosis not present

## 2019-08-06 DIAGNOSIS — G40909 Epilepsy, unspecified, not intractable, without status epilepticus: Secondary | ICD-10-CM | POA: Diagnosis not present

## 2019-08-06 DIAGNOSIS — F0151 Vascular dementia with behavioral disturbance: Secondary | ICD-10-CM | POA: Diagnosis not present

## 2019-08-06 DIAGNOSIS — E1122 Type 2 diabetes mellitus with diabetic chronic kidney disease: Secondary | ICD-10-CM | POA: Diagnosis not present

## 2019-08-06 DIAGNOSIS — I129 Hypertensive chronic kidney disease with stage 1 through stage 4 chronic kidney disease, or unspecified chronic kidney disease: Secondary | ICD-10-CM | POA: Diagnosis not present

## 2019-08-10 DIAGNOSIS — E1122 Type 2 diabetes mellitus with diabetic chronic kidney disease: Secondary | ICD-10-CM | POA: Diagnosis not present

## 2019-08-10 DIAGNOSIS — I129 Hypertensive chronic kidney disease with stage 1 through stage 4 chronic kidney disease, or unspecified chronic kidney disease: Secondary | ICD-10-CM | POA: Diagnosis not present

## 2019-08-10 DIAGNOSIS — E1142 Type 2 diabetes mellitus with diabetic polyneuropathy: Secondary | ICD-10-CM | POA: Diagnosis not present

## 2019-08-10 DIAGNOSIS — D631 Anemia in chronic kidney disease: Secondary | ICD-10-CM | POA: Diagnosis not present

## 2019-08-10 DIAGNOSIS — G40909 Epilepsy, unspecified, not intractable, without status epilepticus: Secondary | ICD-10-CM | POA: Diagnosis not present

## 2019-08-10 DIAGNOSIS — F0151 Vascular dementia with behavioral disturbance: Secondary | ICD-10-CM | POA: Diagnosis not present

## 2019-08-10 DIAGNOSIS — F419 Anxiety disorder, unspecified: Secondary | ICD-10-CM | POA: Diagnosis not present

## 2019-08-10 DIAGNOSIS — I69354 Hemiplegia and hemiparesis following cerebral infarction affecting left non-dominant side: Secondary | ICD-10-CM | POA: Diagnosis not present

## 2019-08-10 DIAGNOSIS — N1832 Chronic kidney disease, stage 3b: Secondary | ICD-10-CM | POA: Diagnosis not present

## 2019-08-14 DIAGNOSIS — N1832 Chronic kidney disease, stage 3b: Secondary | ICD-10-CM | POA: Diagnosis not present

## 2019-08-14 DIAGNOSIS — E1142 Type 2 diabetes mellitus with diabetic polyneuropathy: Secondary | ICD-10-CM | POA: Diagnosis not present

## 2019-08-14 DIAGNOSIS — E1122 Type 2 diabetes mellitus with diabetic chronic kidney disease: Secondary | ICD-10-CM | POA: Diagnosis not present

## 2019-08-14 DIAGNOSIS — G40909 Epilepsy, unspecified, not intractable, without status epilepticus: Secondary | ICD-10-CM | POA: Diagnosis not present

## 2019-08-14 DIAGNOSIS — F419 Anxiety disorder, unspecified: Secondary | ICD-10-CM | POA: Diagnosis not present

## 2019-08-14 DIAGNOSIS — F0151 Vascular dementia with behavioral disturbance: Secondary | ICD-10-CM | POA: Diagnosis not present

## 2019-08-14 DIAGNOSIS — D631 Anemia in chronic kidney disease: Secondary | ICD-10-CM | POA: Diagnosis not present

## 2019-08-14 DIAGNOSIS — I129 Hypertensive chronic kidney disease with stage 1 through stage 4 chronic kidney disease, or unspecified chronic kidney disease: Secondary | ICD-10-CM | POA: Diagnosis not present

## 2019-08-14 DIAGNOSIS — I69354 Hemiplegia and hemiparesis following cerebral infarction affecting left non-dominant side: Secondary | ICD-10-CM | POA: Diagnosis not present

## 2019-09-01 ENCOUNTER — Ambulatory Visit: Payer: Medicare HMO | Admitting: *Deleted

## 2019-09-01 ENCOUNTER — Telehealth: Payer: Self-pay | Admitting: *Deleted

## 2019-09-01 NOTE — Telephone Encounter (Signed)
Received voice mail from wife that patient needed to reschedule his appointment for today. Called wife and she reports patient isn't feeling well. Rescheduled appointment for next Wed April 14 at 1:45 pm.

## 2019-09-09 ENCOUNTER — Encounter: Payer: Medicare HMO | Attending: Internal Medicine | Admitting: *Deleted

## 2019-09-09 ENCOUNTER — Encounter: Payer: Self-pay | Admitting: *Deleted

## 2019-09-09 ENCOUNTER — Other Ambulatory Visit: Payer: Self-pay

## 2019-09-09 VITALS — BP 120/80 | Wt 221.9 lb

## 2019-09-09 DIAGNOSIS — N183 Chronic kidney disease, stage 3 unspecified: Secondary | ICD-10-CM | POA: Insufficient documentation

## 2019-09-09 DIAGNOSIS — E119 Type 2 diabetes mellitus without complications: Secondary | ICD-10-CM

## 2019-09-09 DIAGNOSIS — E1122 Type 2 diabetes mellitus with diabetic chronic kidney disease: Secondary | ICD-10-CM | POA: Insufficient documentation

## 2019-09-09 DIAGNOSIS — Z794 Long term (current) use of insulin: Secondary | ICD-10-CM | POA: Insufficient documentation

## 2019-09-09 DIAGNOSIS — Z713 Dietary counseling and surveillance: Secondary | ICD-10-CM | POA: Diagnosis not present

## 2019-09-09 NOTE — Patient Instructions (Signed)
Check blood sugars 2 x day before breakfast and 2 hrs after supper every day Bring blood sugar records to each MD appointment  Exercise: Continue walking for  120  minutes  6 days a week  Eat 3 meals day,  1-2  snacks a day Space meals 4-6 hours apart Include 1 serving of protein with meals and when eating fruit for a snack  Make an eye doctor appointment  Carry fast acting glucose and a snack at all times Rotate injection sites  Call back for any questions

## 2019-09-09 NOTE — Progress Notes (Signed)
Diabetes Self-Management Education  Visit Type: Follow-up  Appt. Start Time: 1330 Appt. End Time: 1400  09/09/2019  [name], identified by name and date of birth, is a 64 y.o. male with a diagnosis of Diabetes: Type 2.   ASSESSMENT  Blood pressure 120/80, weight 221 lb 14.4 oz (100.7 kg). Body mass index is 35.82 kg/m.  Diabetes Self-Management Education - 09/09/19 1457      Visit Information   Visit Type  Follow-up      Initial Visit   Diabetes Type  Type 2      Psychosocial Assessment   Other persons present  Spouse/SO      Complications   How often do you check your blood sugar?  1-2 times/day   Wife didn't bring BG records.   Fasting Blood glucose range (mg/dL)  --   Wife reports FBG's 90-140 mg/dL.   Postprandial Blood glucose range (mg/dL)  --   Wife reports pp's 180's mg/dL and less   Have you had a dilated eye exam in the past 12 months?  No    Have you had a dental exam in the past 12 months?  No    Are you checking your feet?  Yes    How many days per week are you checking your feet?  7      Dietary Intake   Breakfast  3 meals and 1 snack/day - doesn't always include protein with breakfast or snacks of fruit      Exercise   Exercise Type  Light (walking / raking leaves)    How many days per week to you exercise?  6    How many minutes per day do you exercise?  120    Total minutes per week of exercise  720      Patient Education   Disease state   Explored patient's options for treatment of their diabetes    Nutrition management   Role of diet in the treatment of diabetes and the relationship between the three main macronutrients and blood glucose level;Food label reading, portion sizes and measuring food.;Reviewed blood glucose goals for pre and post meals and how to evaluate the patients' food intake on their blood glucose level.;Meal timing in regards to the patients' current diabetes medication.    Physical activity and exercise   Role of  exercise on diabetes management, blood pressure control and cardiac health.    Medications  Taught/reviewed insulin injection, site rotation, insulin storage and needle disposal.;Reviewed patients medication for diabetes, action, purpose, timing of dose and side effects.    Monitoring  Purpose and frequency of SMBG.;Taught/discussed recording of test results and interpretation of SMBG.;Identified appropriate SMBG and/or A1C goals.;Yearly dilated eye exam    Acute complications  Taught treatment of hypoglycemia - the 15 rule.    Chronic complications  Relationship between chronic complications and blood glucose control      Individualized Goals (developed by patient)   Nutrition  Follow meal plan discussed    Monitoring   test my blood glucose as discussed      Post-Education Assessment   Patient understands the diabetes disease and treatment process.  Demonstrates understanding / competency    Patient understands incorporating nutritional management into lifestyle.  Needs Review    Patient undertands incorporating physical activity into lifestyle.  Demonstrates understanding / competency    Patient understands using medications safely.  Demonstrates understanding / competency    Patient understands monitoring blood glucose, interpreting and using results  Needs Review    Patient understands prevention, detection, and treatment of acute complications.  Needs Review    Patient understands prevention, detection, and treatment of chronic complications.  Demonstrates understanding / competency    Patient understands how to develop strategies to address psychosocial issues.  Demonstrates understanding / competency    Patient understands how to develop strategies to promote health/change behavior.  Demonstrates understanding / competency      Outcomes   Expected Outcomes  Demonstrated interest in learning. Expect positive outcomes    Future DMSE  PRN    Program Status  Completed      Subsequent  Visit   Since your last visit have you continued or begun to take your medications as prescribed?  Yes    Since your last visit have you had your blood pressure checked?  No    Since your last visit have you experienced any weight changes?  Gain    Weight Gain (lbs)  3.5    Since your last visit, are you checking your blood glucose at least once a day?  Yes       Individualized Plan for Diabetes Self-Management Training:   Learning Objective:  Patient will have a greater understanding of diabetes self-management. Patient education plan is to attend individual and/or group sessions per assessed needs and concerns.   Plan:   Patient Instructions  Check blood sugars 2 x day before breakfast and 2 hrs after supper every day Bring blood sugar records to each MD appointment Exercise: Continue walking for  120  minutes  6 days a week Eat 3 meals day,  1-2  snacks a day Space meals 4-6 hours apart Include 1 serving of protein with meals and when eating fruit for a snack Make an eye doctor appointment Carry fast acting glucose and a snack at all times Rotate injection sites Call back for any questions  Expected Outcomes:  Demonstrated interest in learning. Expect positive outcomes  Education material provided:  Symptoms, causes and treatments of Hypoglycemia  If problems or questions, patient to contact team via:  Johny Drilling, RN, [place], [place] [phone]  Future DSME appointment: PRN

## 2019-09-14 DIAGNOSIS — R198 Other specified symptoms and signs involving the digestive system and abdomen: Secondary | ICD-10-CM | POA: Diagnosis not present

## 2019-09-14 DIAGNOSIS — N1832 Chronic kidney disease, stage 3b: Secondary | ICD-10-CM | POA: Diagnosis not present

## 2019-09-14 DIAGNOSIS — E1122 Type 2 diabetes mellitus with diabetic chronic kidney disease: Secondary | ICD-10-CM | POA: Diagnosis not present

## 2019-09-14 DIAGNOSIS — K5900 Constipation, unspecified: Secondary | ICD-10-CM | POA: Diagnosis not present

## 2019-09-14 DIAGNOSIS — N183 Chronic kidney disease, stage 3 unspecified: Secondary | ICD-10-CM | POA: Diagnosis not present

## 2019-09-14 DIAGNOSIS — F0151 Vascular dementia with behavioral disturbance: Secondary | ICD-10-CM | POA: Diagnosis not present

## 2019-09-14 DIAGNOSIS — K625 Hemorrhage of anus and rectum: Secondary | ICD-10-CM | POA: Diagnosis not present

## 2019-10-05 ENCOUNTER — Other Ambulatory Visit
Admission: RE | Admit: 2019-10-05 | Discharge: 2019-10-05 | Disposition: A | Discharge status: Home / Self Care | Payer: Medicare HMO | Source: Ambulatory Visit | Attending: Internal Medicine | Admitting: Internal Medicine

## 2019-10-05 DIAGNOSIS — Z01812 Encounter for preprocedural laboratory examination: Secondary | ICD-10-CM | POA: Insufficient documentation

## 2019-10-05 DIAGNOSIS — Z20822 Contact with and (suspected) exposure to covid-19: Secondary | ICD-10-CM | POA: Insufficient documentation

## 2019-10-05 LAB — SARS CORONAVIRUS 2 (TAT 6-24 HRS): SARS Coronavirus 2: NEGATIVE

## 2019-10-06 ENCOUNTER — Encounter: Payer: Self-pay | Admitting: Internal Medicine

## 2019-10-07 ENCOUNTER — Ambulatory Visit: Payer: Medicare HMO | Admitting: Certified Registered Nurse Anesthetist

## 2019-10-07 ENCOUNTER — Ambulatory Visit
Admission: RE | Admit: 2019-10-07 | Discharge: 2019-10-07 | Disposition: A | Discharge status: Home / Self Care | Payer: Medicare HMO | Attending: Internal Medicine | Admitting: Internal Medicine

## 2019-10-07 ENCOUNTER — Encounter
Admission: RE | Disposition: A | Discharge status: Home / Self Care | Payer: Self-pay | Source: Home / Self Care | Attending: Internal Medicine

## 2019-10-07 ENCOUNTER — Other Ambulatory Visit: Payer: Self-pay

## 2019-10-07 ENCOUNTER — Encounter: Payer: Self-pay | Admitting: Internal Medicine

## 2019-10-07 DIAGNOSIS — R1310 Dysphagia, unspecified: Secondary | ICD-10-CM | POA: Diagnosis not present

## 2019-10-07 DIAGNOSIS — Z794 Long term (current) use of insulin: Secondary | ICD-10-CM | POA: Diagnosis not present

## 2019-10-07 DIAGNOSIS — Z79899 Other long term (current) drug therapy: Secondary | ICD-10-CM | POA: Insufficient documentation

## 2019-10-07 DIAGNOSIS — R569 Unspecified convulsions: Secondary | ICD-10-CM | POA: Insufficient documentation

## 2019-10-07 DIAGNOSIS — E119 Type 2 diabetes mellitus without complications: Secondary | ICD-10-CM | POA: Insufficient documentation

## 2019-10-07 DIAGNOSIS — Z8673 Personal history of transient ischemic attack (TIA), and cerebral infarction without residual deficits: Secondary | ICD-10-CM | POA: Diagnosis not present

## 2019-10-07 DIAGNOSIS — K219 Gastro-esophageal reflux disease without esophagitis: Secondary | ICD-10-CM | POA: Insufficient documentation

## 2019-10-07 DIAGNOSIS — K228 Other specified diseases of esophagus: Secondary | ICD-10-CM | POA: Insufficient documentation

## 2019-10-07 DIAGNOSIS — K5730 Diverticulosis of large intestine without perforation or abscess without bleeding: Secondary | ICD-10-CM | POA: Insufficient documentation

## 2019-10-07 DIAGNOSIS — D124 Benign neoplasm of descending colon: Secondary | ICD-10-CM | POA: Diagnosis not present

## 2019-10-07 DIAGNOSIS — K921 Melena: Secondary | ICD-10-CM | POA: Insufficient documentation

## 2019-10-07 DIAGNOSIS — R195 Other fecal abnormalities: Secondary | ICD-10-CM | POA: Insufficient documentation

## 2019-10-07 DIAGNOSIS — R194 Change in bowel habit: Secondary | ICD-10-CM | POA: Insufficient documentation

## 2019-10-07 DIAGNOSIS — Z87891 Personal history of nicotine dependence: Secondary | ICD-10-CM | POA: Insufficient documentation

## 2019-10-07 DIAGNOSIS — K648 Other hemorrhoids: Secondary | ICD-10-CM | POA: Diagnosis not present

## 2019-10-07 DIAGNOSIS — I10 Essential (primary) hypertension: Secondary | ICD-10-CM | POA: Diagnosis not present

## 2019-10-07 DIAGNOSIS — N1832 Chronic kidney disease, stage 3b: Secondary | ICD-10-CM | POA: Diagnosis not present

## 2019-10-07 DIAGNOSIS — I129 Hypertensive chronic kidney disease with stage 1 through stage 4 chronic kidney disease, or unspecified chronic kidney disease: Secondary | ICD-10-CM | POA: Diagnosis not present

## 2019-10-07 DIAGNOSIS — K635 Polyp of colon: Secondary | ICD-10-CM | POA: Diagnosis not present

## 2019-10-07 DIAGNOSIS — R1312 Dysphagia, oropharyngeal phase: Secondary | ICD-10-CM | POA: Diagnosis not present

## 2019-10-07 DIAGNOSIS — K625 Hemorrhage of anus and rectum: Secondary | ICD-10-CM | POA: Diagnosis not present

## 2019-10-07 DIAGNOSIS — K5790 Diverticulosis of intestine, part unspecified, without perforation or abscess without bleeding: Secondary | ICD-10-CM | POA: Diagnosis not present

## 2019-10-07 DIAGNOSIS — B3781 Candidal esophagitis: Secondary | ICD-10-CM | POA: Diagnosis not present

## 2019-10-07 DIAGNOSIS — E1122 Type 2 diabetes mellitus with diabetic chronic kidney disease: Secondary | ICD-10-CM | POA: Diagnosis not present

## 2019-10-07 HISTORY — PX: COLONOSCOPY WITH PROPOFOL: SHX5780

## 2019-10-07 HISTORY — PX: ESOPHAGOGASTRODUODENOSCOPY (EGD) WITH PROPOFOL: SHX5813

## 2019-10-07 LAB — KOH PREP: Special Requests: NORMAL

## 2019-10-07 LAB — GLUCOSE, CAPILLARY: Glucose-Capillary: 83 mg/dL (ref 70–99)

## 2019-10-07 SURGERY — COLONOSCOPY WITH PROPOFOL
Anesthesia: General

## 2019-10-07 MED ORDER — PROPOFOL 10 MG/ML IV BOLUS
INTRAVENOUS | Status: DC | PRN
  Administered 2019-10-07: 100 ug/kg/min via INTRAVENOUS

## 2019-10-07 MED ORDER — PROPOFOL 10 MG/ML IV BOLUS
INTRAVENOUS | Status: AC
  Filled 2019-10-07: qty 40

## 2019-10-07 MED ORDER — LIDOCAINE HCL (CARDIAC) PF 100 MG/5ML IV SOSY
PREFILLED_SYRINGE | INTRAVENOUS | Status: DC | PRN
  Administered 2019-10-07: 30 mg via INTRATRACHEAL

## 2019-10-07 MED ORDER — SODIUM CHLORIDE 0.9 % IV SOLN: INTRAVENOUS | Status: DC

## 2019-10-07 NOTE — H&P (Signed)
Outpatient short stay form Pre-procedure 10/07/2019 8:56 AM Kewanda Poland K. Alice Reichert, M.D.  Primary Physician: Frazier Richards, M.D.  Reason for visit: Oropharyngeal dysphagia, GERD, rectal bleeding, change in bowel habits.  History of present illness:  64 y/o patient with relatively well controlled GERD presents with oropharyngeal dysphagia to solids amid late night eating. No abdominal pain, melena. Has hx of constipation with intermittent rectal bleeding with stools #1 on the Bristol stool scale.       Current Facility-Administered Medications:  .  0.9 %  sodium chloride infusion, , Intravenous, Continuous, Halaina Vanduzer, Benay Pike, MD  Medications Prior to Admission  Medication Sig Dispense Refill Last Dose  . atorvastatin (LIPITOR) 40 MG tablet Take 40 mg by mouth daily.   10/06/2019 at 0800  . blood glucose meter kit and supplies KIT Dispense based on patient and insurance preference. Use up to four times daily as directed. (FOR ICD-9 250.00, 250.01). 1 each 0 10/06/2019 at Unknown time  . Docusate Sodium (DSS) 100 MG CAPS Take 100 mg by mouth in the morning and at bedtime.   Past Week at Unknown time  . DULoxetine (CYMBALTA) 30 MG capsule Take 30 mg by mouth daily. Take with 60 mg   10/06/2019 at 0800  . DULoxetine (CYMBALTA) 60 MG capsule Take 60 mg by mouth daily. Takes with 30 mg   10/06/2019 at 0800  . gabapentin (NEURONTIN) 300 MG capsule Take 600 mg by mouth 3 (three) times daily.   10/06/2019 at 2100  . insulin glargine (LANTUS) 100 UNIT/ML injection Inject 0.2 mLs (20 Units total) into the skin at bedtime. (Patient taking differently: Inject 25 Units into the skin at bedtime. ) 10 mL 11 10/06/2019 at 2100  . lamoTRIgine (LAMICTAL) 200 MG tablet Take 1 tablet (200 mg total) by mouth 2 (two) times daily. 60 tablet 0 10/06/2019 at 2100  . metFORMIN (GLUCOPHAGE) 500 MG tablet Take 500 mg by mouth 2 (two) times daily.   10/06/2019 at 2100  . nortriptyline (PAMELOR) 50 MG capsule Take 50 mg by mouth  at bedtime.   10/06/2019 at 2100  . tamsulosin (FLOMAX) 0.4 MG CAPS capsule Take 1 capsule (0.4 mg total) by mouth daily. 30 capsule 11 10/06/2019 at 800     No Known Allergies   Past Medical History:  Diagnosis Date  . Diabetes mellitus without complication ([place])   . Hypertension   . Pancreatitis   . Seizures ([place])   . Stroke ([place])   . Stroke [facility]) 2016    Review of systems:  Otherwise negative.    Physical Exam  Gen: Alert, oriented. Appears stated age.  HEENT: /AT. PERRLA. Lungs: CTA, no wheezes. CV: RR nl S1, S2. Abd: soft, benign, no masses. BS+ Ext: No edema. Pulses 2+    Planned procedures: Proceed with EGD and colonoscopy. The patient understands the nature of the planned procedure, indications, risks, alternatives and potential complications including but not limited to bleeding, infection, perforation, damage to internal organs and possible oversedation/side effects from anesthesia. The patient agrees and gives consent to proceed.  Please refer to procedure notes for findings, recommendations and patient disposition/instructions.     Kayleena Eke K. Alice Reichert, M.D. Gastroenterology 10/07/2019  8:56 AM

## 2019-10-07 NOTE — Anesthesia Preprocedure Evaluation (Addendum)
Anesthesia Evaluation  Patient identified by MRN, date of birth, ID band Patient awake    Reviewed: Allergy & Precautions, H&P , NPO status , Patient's Chart, lab work & pertinent test results  History of Anesthesia Complications Negative for: history of anesthetic complications  Airway Mallampati: III  TM Distance: >3 FB Neck ROM: limited    Dental  (+) Upper Dentures, Lower Dentures   Pulmonary neg pulmonary ROS, neg shortness of breath, former smoker,    Pulmonary exam normal        Cardiovascular hypertension, (-) angina(-) Past MI Normal cardiovascular exam     Neuro/Psych Seizures -, Well Controlled,  PSYCHIATRIC DISORDERS  Neuromuscular disease CVA (left sided), Residual Symptoms    GI/Hepatic negative GI ROS, Neg liver ROS,   Endo/Other  diabetes, Type 2, Insulin Dependent  Renal/GU Renal disease  negative genitourinary   Musculoskeletal   Abdominal   Peds  Hematology negative hematology ROS (+)   Anesthesia Other Findings Past Medical History: No date: Diabetes mellitus without complication (H[facility]) No date: Hypertension No date: Pancreatitis No date: Seizures ([place]) No date: Stroke [facility]) 2016: Stroke [facility])  Past Surgical History: 11/01/2014: TEE WITHOUT CARDIOVERSION; N/A     Comment:  Procedure: TRANSESOPHAGEAL ECHOCARDIOGRAM (TEE);                Surgeon: Wellington Hampshire, MD;  Location: [facility] ORS;                Service: Cardiovascular;  Laterality: N/A;     Reproductive/Obstetrics negative OB ROS                             Anesthesia Physical Anesthesia Plan  ASA: III  Anesthesia Plan: General   Post-op Pain Management:    Induction: Intravenous  PONV Risk Score and Plan: Propofol infusion and TIVA  Airway Management Planned: Natural Airway and Nasal Cannula  Additional Equipment:   Intra-op Plan:   Post-operative Plan:   Informed Consent: I have reviewed  the patients History and Physical, chart, labs and discussed the procedure including the risks, benefits and alternatives for the proposed anesthesia with the patient or authorized representative who has indicated his/her understanding and acceptance.     Dental Advisory Given  Plan Discussed with: Anesthesiologist, CRNA and Surgeon  Anesthesia Plan Comments: (Patient and wife consented for risks of anesthesia including but not limited to:  - adverse reactions to medications - risk of intubation if required - damage to eyes, teeth, lips or other oral mucosa - nerve damage due to positioning  - sore throat or hoarseness - Damage to heart, brain, nerves, lungs or loss of life  They voiced understanding.)       Anesthesia Quick Evaluation

## 2019-10-07 NOTE — Op Note (Addendum)
[facility] Gastroenterology Patient Name: Daniel [name] Procedure Date: 10/07/2019 9:50 AM MRN: [id number] Account #: [id number] Date of Birth: 1956/03/27 Admit Type: Outpatient Age: 64 Room: [facility] ENDO ROOM 3 Gender: Male Note Status: Finalized Procedure:             Upper GI endoscopy Indications:           Dysphagia, Esophageal reflux Providers:             Benay Pike. Alice Reichert MD, MD Referring MD:          Ocie Cornfield. Ouida Sills MD, MD (Referring MD) Medicines:             Propofol per Anesthesia Complications:         No immediate complications. Procedure:             Pre-Anesthesia Assessment:                        - The risks and benefits of the procedure and the                         sedation options and risks were discussed with the                         patient. All questions were answered and informed                         consent was obtained.                        - Patient identification and proposed procedure were                         verified prior to the procedure by the nurse. The                         procedure was verified in the procedure room.                        - ASA Grade Assessment: III - A patient with severe                         systemic disease.                        - After reviewing the risks and benefits, the patient                         was deemed in satisfactory condition to undergo the                         procedure.                        After obtaining informed consent, the endoscope was                         passed under direct vision. Throughout the procedure,                         the patient's blood  pressure, pulse, and oxygen                         saturations were monitored continuously. The Endoscope                         was introduced through the mouth, and advanced to the                         third part of duodenum. The upper GI endoscopy was                         accomplished  without difficulty. The patient tolerated                         the procedure well. Findings:      Diffuse, white plaques were found in the middle third of the esophagus       and in the lower third of the esophagus. Cells for cytology were       obtained by brushing.      The stomach was normal.      The examined duodenum was normal.      The exam was otherwise without abnormality. Impression:            - Esophageal plaques were found, consistent with                         candidiasis. Cells for cytology obtained.                        - Normal stomach.                        - Normal examined duodenum.                        - The examination was otherwise normal. Recommendation:        - Await pathology results.                        - Proceed with colonoscopy Procedure Code(s):     --- Professional ---                        [phone], Esophagogastroduodenoscopy, flexible,                         transoral; diagnostic, including collection of                         specimen(s) by brushing or washing, when performed                         (separate procedure) Diagnosis Code(s):     --- Professional ---                        K21.9, Gastro-esophageal reflux disease without                         esophagitis  R13.10, Dysphagia, unspecified                        K22.9, Disease of esophagus, unspecified CPT copyright 2019 American Medical Association. All rights reserved. The codes documented in this report are preliminary and upon coder review may  be revised to meet current compliance requirements. Efrain Sella MD, MD 10/07/2019 10:00:50 AM This report has been signed electronically. Number of Addenda: 0 Note Initiated On: 10/07/2019 9:50 AM Estimated Blood Loss:  Estimated blood loss: none.      [facility]

## 2019-10-07 NOTE — Anesthesia Postprocedure Evaluation (Signed)
Anesthesia Post Note  Patient: Daniel [name]  Procedure(s) Performed: COLONOSCOPY WITH PROPOFOL (N/A ) ESOPHAGOGASTRODUODENOSCOPY (EGD) WITH PROPOFOL (N/A )  Patient location during evaluation: Endoscopy Anesthesia Type: General Level of consciousness: awake and alert Pain management: pain level controlled Vital Signs Assessment: post-procedure vital signs reviewed and stable Respiratory status: spontaneous breathing, nonlabored ventilation, respiratory function stable and patient connected to nasal cannula oxygen Cardiovascular status: blood pressure returned to baseline and stable Postop Assessment: no apparent nausea or vomiting Anesthetic complications: no     Last Vitals:  Vitals:   10/07/19 1038 10/07/19 1048  BP: (!) 149/92 (!) 141/94  Pulse: 83 71  Resp: (!) 24 12  Temp:    SpO2: 98% 99%    Last Pain:  Vitals:   10/07/19 1048  TempSrc:   PainSc: 0-No pain                 Precious Haws Althea Backs

## 2019-10-07 NOTE — Interval H&P Note (Signed)
History and Physical Interval Note:  10/07/2019 8:58 AM  Daniel [name]  has presented today for surgery, with the diagnosis of RECTAL BLEED DYSPHAGIA.  The various methods of treatment have been discussed with the patient and family. After consideration of risks, benefits and other options for treatment, the patient has consented to  Procedure(s): COLONOSCOPY WITH PROPOFOL (N/A) ESOPHAGOGASTRODUODENOSCOPY (EGD) WITH PROPOFOL (N/A) as a surgical intervention.  The patient's history has been reviewed, patient examined, no change in status, stable for surgery.  I have reviewed the patient's chart and labs.  Questions were answered to the patient's satisfaction.     [place], [place]

## 2019-10-07 NOTE — Transfer of Care (Signed)
Immediate Anesthesia Transfer of Care Note  Patient: Daniel [name]  Procedure(s) Performed: COLONOSCOPY WITH PROPOFOL (N/A ) ESOPHAGOGASTRODUODENOSCOPY (EGD) WITH PROPOFOL (N/A )  Patient Location: PACU  Anesthesia Type:General  Level of Consciousness: awake and alert   Airway & Oxygen Therapy: Patient Spontanous Breathing  Post-op Assessment: Report given to RN  Post vital signs: Reviewed and stable  Last Vitals:  Vitals Value Taken Time  BP 151/96 10/07/19 1019  Temp    Pulse 79 10/07/19 1019  Resp 16 10/07/19 1019  SpO2 97 % 10/07/19 1019    Last Pain:  Vitals:   10/07/19 1018  TempSrc:   PainSc: Asleep         Complications: No apparent anesthesia complications

## 2019-10-07 NOTE — Op Note (Addendum)
[facility] Patient Name: Daniel [name] Procedure Date: 10/07/2019 9:49 AM MRN: [id number] Account #: [id number] Date of Birth: 23-Jul-1955 Admit Type: Outpatient Age: 64 Room: [facility] ENDO ROOM 3 Gender: Male Note Status: Finalized Procedure:             Colonoscopy Indications:           Hematochezia, Change in bowel habits, Change in stool                         caliber Providers:             Benay Pike. Alice Reichert MD, MD Referring MD:          Ocie Cornfield. Ouida Sills MD, MD (Referring MD) Medicines:             Propofol per Anesthesia Complications:         No immediate complications. Procedure:             Pre-Anesthesia Assessment:                        - The risks and benefits of the procedure and the                         sedation options and risks were discussed with the                         patient. All questions were answered and informed                         consent was obtained.                        - Patient identification and proposed procedure were                         verified prior to the procedure by the nurse. The                         procedure was verified in the procedure room.                        - ASA Grade Assessment: III - A patient with severe                         systemic disease.                        - After reviewing the risks and benefits, the patient                         was deemed in satisfactory condition to undergo the                         procedure.                        After obtaining informed consent, the colonoscope was                         passed under direct vision. Throughout the  procedure,                         the patient's blood pressure, pulse, and oxygen                         saturations were monitored continuously. The                         Colonoscope was introduced through the anus and                         advanced to the the cecum, identified by appendiceal                      orifice and ileocecal valve. The colonoscopy was                         performed without difficulty. The patient tolerated                         the procedure well. The quality of the bowel                         preparation was adequate. The ileocecal valve,                         appendiceal orifice, and rectum were photographed. Findings:      The perianal and digital rectal examinations were normal. Pertinent       negatives include normal sphincter tone and no palpable rectal lesions.      A few small-mouthed diverticula were found in the sigmoid colon.      A 5 mm polyp was found in the descending colon. The polyp was sessile.       The polyp was removed with a jumbo cold forceps. Resection and retrieval       were complete.      The exam was otherwise without abnormality. Impression:            - Diverticulosis in the sigmoid colon.                        - One 5 mm polyp in the descending colon, removed with                         a jumbo cold forceps. Resected and retrieved.                        - The examination was otherwise normal. Recommendation:        - Await pathology results from EGD, also performed                         today.                        - Patient has a contact number available for                         emergencies. The signs and symptoms of potential  delayed complications were discussed with the patient.                         Return to normal activities tomorrow. Written                         discharge instructions were provided to the patient.                        - Resume previous diet.                        - Continue present medications.                        - Repeat colonoscopy is recommended for surveillance.                         The colonoscopy date will be determined after                         pathology results from today's exam become available                         for  review.                        - Return to physician assistant in 6 weeks.                        - Follow up with Octavia Bruckner, PA-C in [ ]  months.                        - The findings and recommendations were discussed with                         the patient. Procedure Code(s):     --- Professional ---                        [phone], Colonoscopy, flexible; with biopsy, single or                         multiple Diagnosis Code(s):     --- Professional ---                        K57.30, Diverticulosis of large intestine without                         perforation or abscess without bleeding                        R19.5, Other fecal abnormalities                        R19.4, Change in bowel habit                        K92.1, Melena (includes Hematochezia)                        K63.5,  Polyp of colon CPT copyright 2019 American Medical Association. All rights reserved. The codes documented in this report are preliminary and upon coder review may  be revised to meet current compliance requirements. Efrain Sella MD, MD 10/07/2019 10:17:26 AM This report has been signed electronically. Number of Addenda: 0 Note Initiated On: 10/07/2019 9:49 AM Scope Withdrawal Time: 0 hours 7 minutes 29 seconds  Total Procedure Duration: 0 hours 10 minutes 24 seconds  Estimated Blood Loss:  Estimated blood loss: none.      [facility]

## 2019-10-08 ENCOUNTER — Encounter: Payer: Self-pay | Admitting: *Deleted

## 2019-10-08 LAB — SURGICAL PATHOLOGY

## 2019-11-01 IMAGING — US US RENAL
2 series · 14 of 25 positions shown · non-contrast
Comparison: CT abdomen pelvis of 10/04/2013

CLINICAL DATA: Renal failure

EXAM:
RENAL / URINARY TRACT ULTRASOUND COMPLETE

[Series 1: us renal · 0.23mm/px · 11 of 58 slices shown (1 of 2)]
[im 1/58]
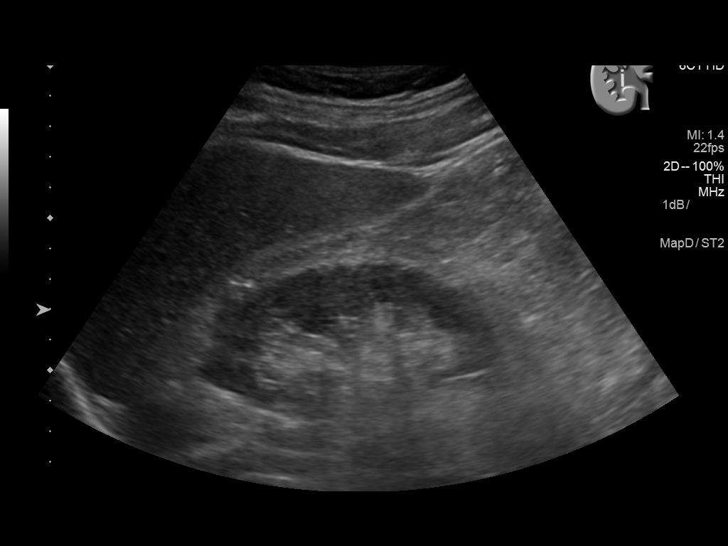
[im 7/58]
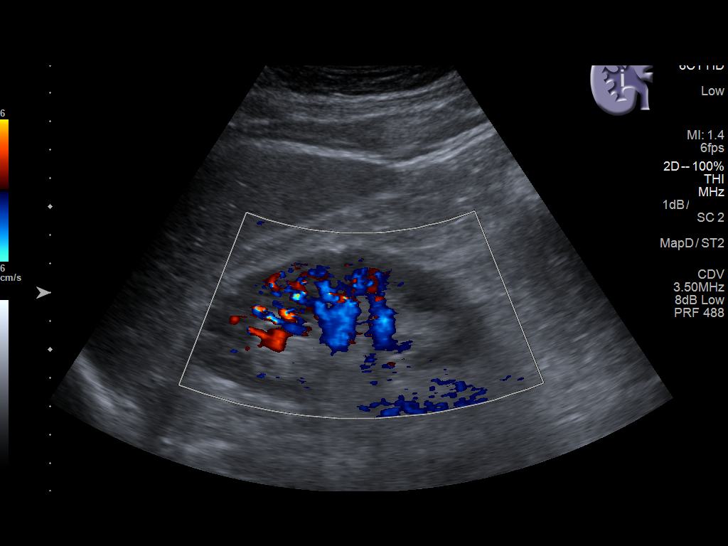
[im 13/58]
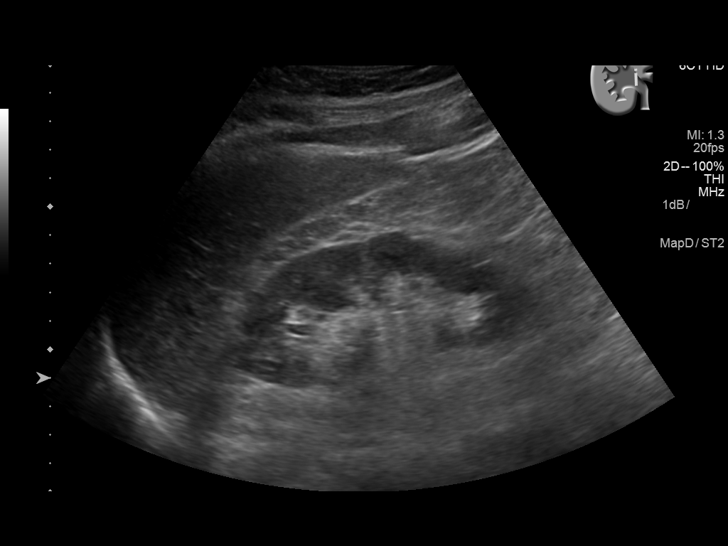
[im 19/58]
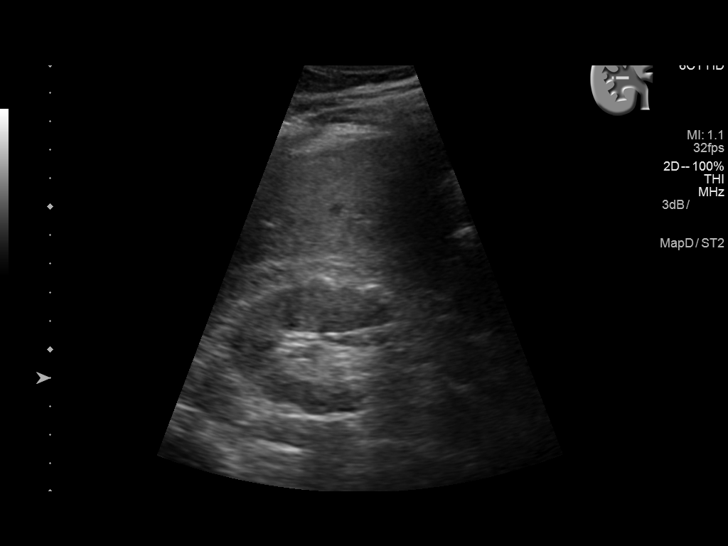
[im 25/58]
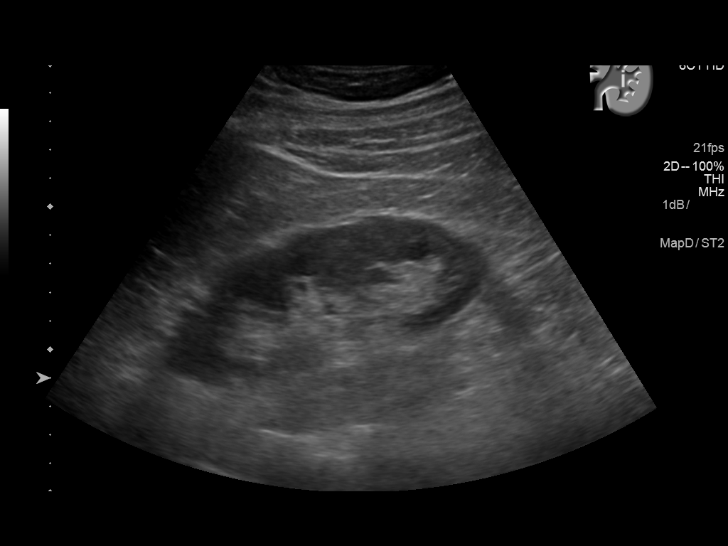
[im 28/58]
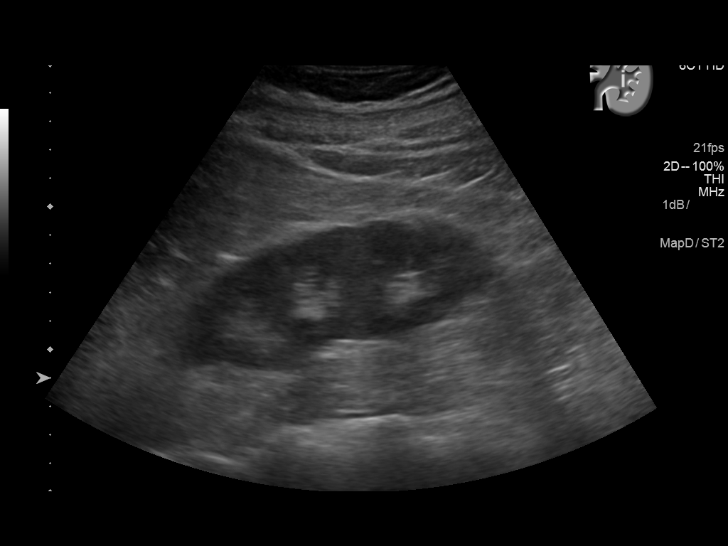
[im 34/58]
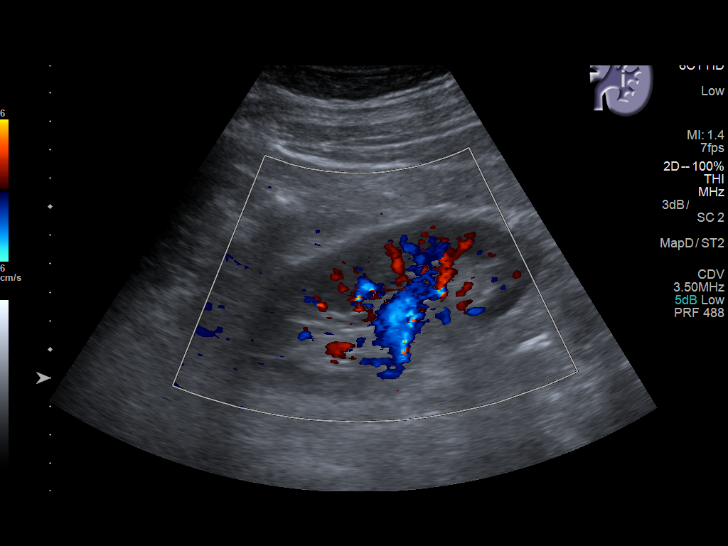
[im 40/58]
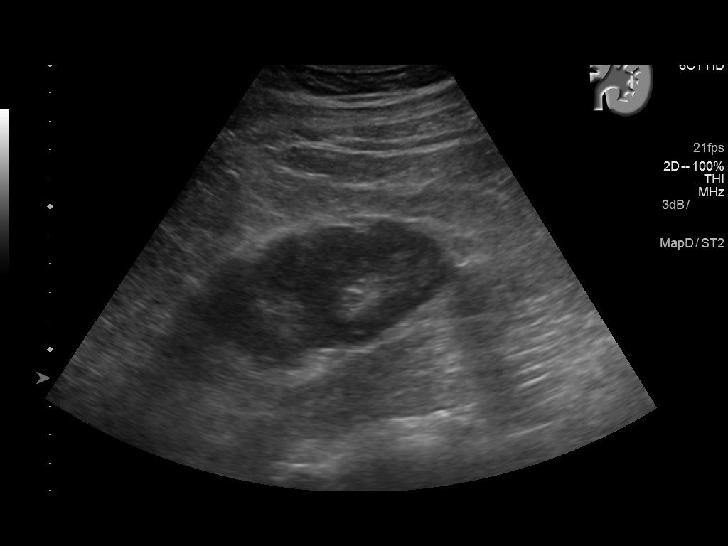
[im 46/58]
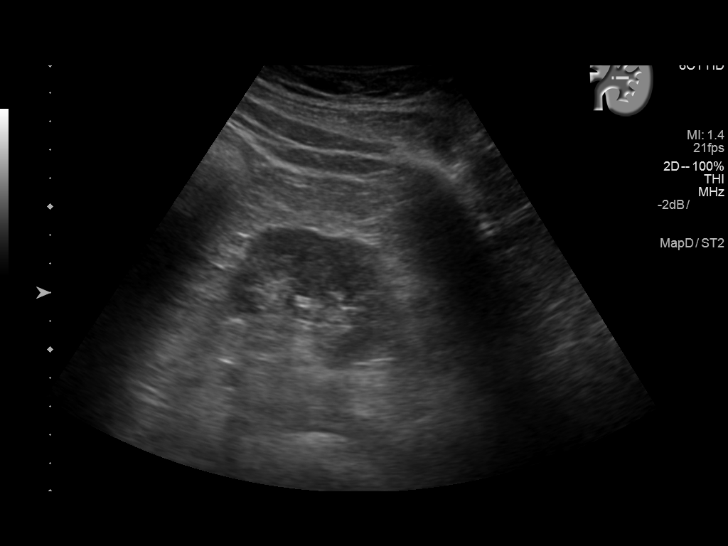
[im 49/58]
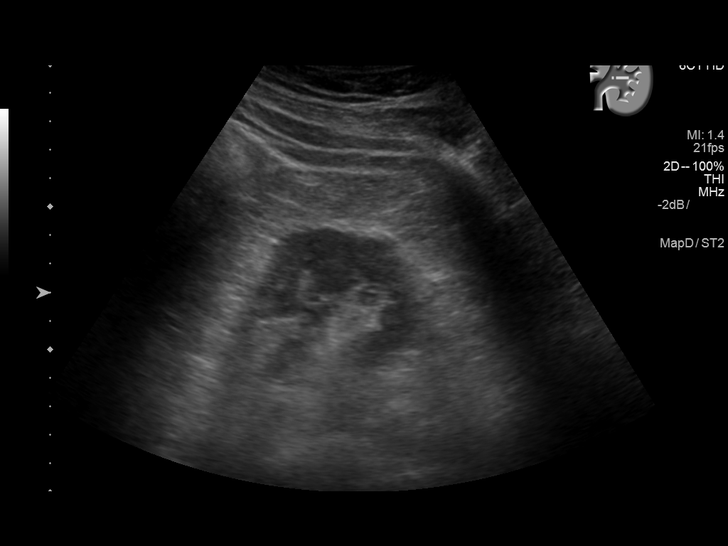
[im 55/58]
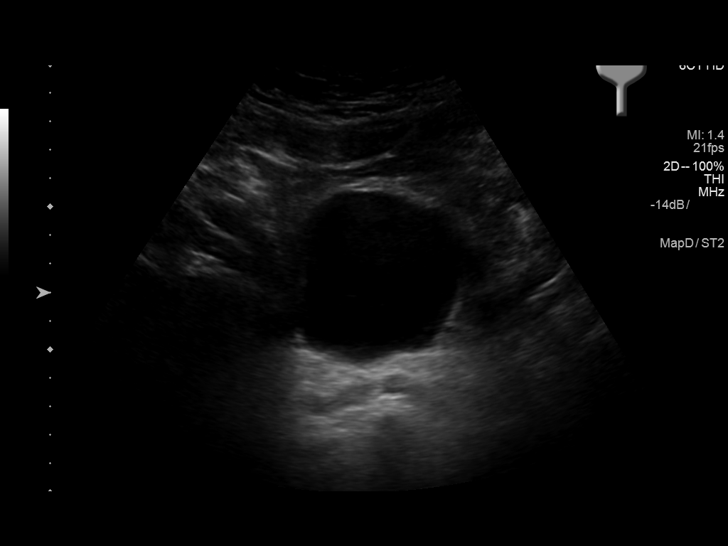

[Series 2001: us renal · 0.20mm/px · 3 of 15 slices shown (2 of 2)]
[im 1/15]
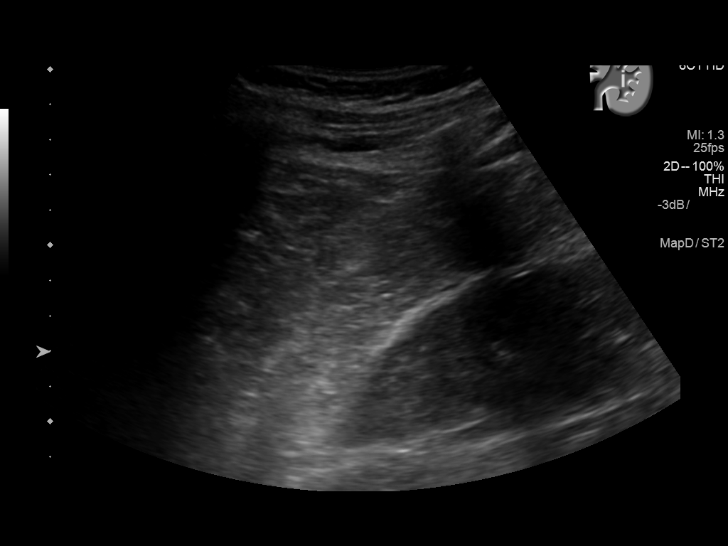
[im 8/15]
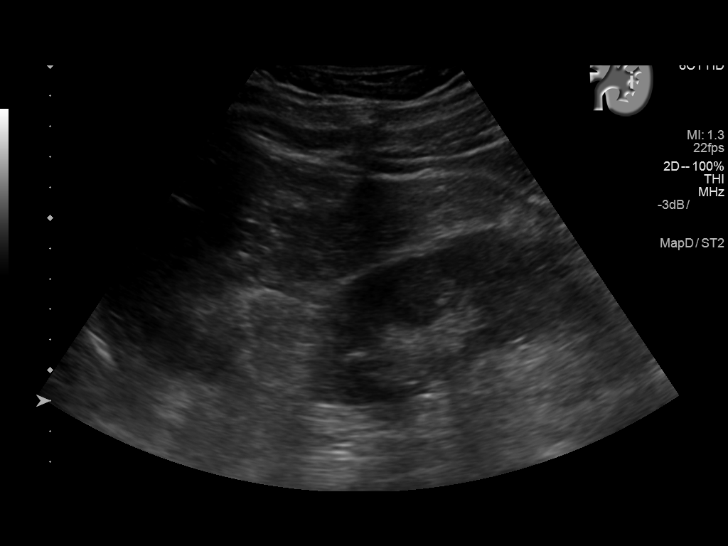
[im 15/15]
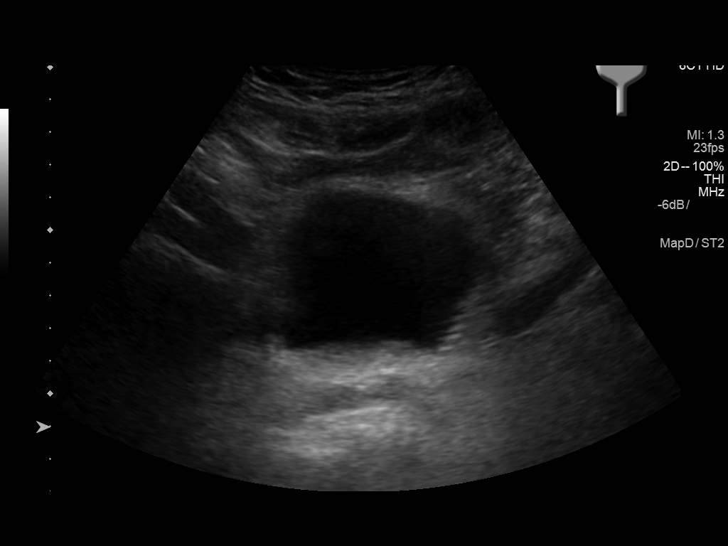

[14 of 25 positions shown; findings below may reference images not displayed]

FINDINGS: Right Kidney:

Length: 10.7 cm.. No hydronephrosis is seen. The echogenicity of the
renal parenchyma is within normal limits.

Left Kidney:

Length: 11.0 cm.. No hydronephrosis is seen. The renal echogenicity
is within normal limits.

Bladder:

The urinary bladder is moderately well distended with no abnormality
noted. The prostate measures 3.0 x 4.8 x 4.4 cm.
IMPRESSION: 1. No hydronephrosis.
2. The renal parenchymal echogenicity is within normal limits
bilaterally.

## 2019-12-31 ENCOUNTER — Emergency Department: Payer: Medicare HMO

## 2019-12-31 ENCOUNTER — Other Ambulatory Visit: Payer: Self-pay

## 2019-12-31 DIAGNOSIS — Y929 Unspecified place or not applicable: Secondary | ICD-10-CM | POA: Diagnosis not present

## 2019-12-31 DIAGNOSIS — S0231XA Fracture of orbital floor, right side, initial encounter for closed fracture: Secondary | ICD-10-CM | POA: Diagnosis not present

## 2019-12-31 DIAGNOSIS — R55 Syncope and collapse: Principal | ICD-10-CM | POA: Insufficient documentation

## 2019-12-31 DIAGNOSIS — N1832 Chronic kidney disease, stage 3b: Secondary | ICD-10-CM | POA: Insufficient documentation

## 2019-12-31 DIAGNOSIS — E1165 Type 2 diabetes mellitus with hyperglycemia: Secondary | ICD-10-CM | POA: Diagnosis not present

## 2019-12-31 DIAGNOSIS — W1839XA Other fall on same level, initial encounter: Secondary | ICD-10-CM | POA: Diagnosis not present

## 2019-12-31 DIAGNOSIS — I130 Hypertensive heart and chronic kidney disease with heart failure and stage 1 through stage 4 chronic kidney disease, or unspecified chronic kidney disease: Secondary | ICD-10-CM | POA: Diagnosis not present

## 2019-12-31 DIAGNOSIS — E785 Hyperlipidemia, unspecified: Secondary | ICD-10-CM | POA: Diagnosis not present

## 2019-12-31 DIAGNOSIS — Y9389 Activity, other specified: Secondary | ICD-10-CM | POA: Insufficient documentation

## 2019-12-31 DIAGNOSIS — S0990XA Unspecified injury of head, initial encounter: Secondary | ICD-10-CM | POA: Diagnosis not present

## 2019-12-31 DIAGNOSIS — Y999 Unspecified external cause status: Secondary | ICD-10-CM | POA: Diagnosis not present

## 2019-12-31 DIAGNOSIS — S02412A LeFort II fracture, initial encounter for closed fracture: Secondary | ICD-10-CM | POA: Insufficient documentation

## 2019-12-31 DIAGNOSIS — Z20822 Contact with and (suspected) exposure to covid-19: Secondary | ICD-10-CM | POA: Diagnosis not present

## 2019-12-31 DIAGNOSIS — I679 Cerebrovascular disease, unspecified: Secondary | ICD-10-CM | POA: Diagnosis not present

## 2019-12-31 DIAGNOSIS — S0240CA Maxillary fracture, right side, initial encounter for closed fracture: Secondary | ICD-10-CM | POA: Diagnosis not present

## 2019-12-31 DIAGNOSIS — G9389 Other specified disorders of brain: Secondary | ICD-10-CM | POA: Diagnosis not present

## 2019-12-31 DIAGNOSIS — S022XXA Fracture of nasal bones, initial encounter for closed fracture: Secondary | ICD-10-CM | POA: Diagnosis not present

## 2019-12-31 DIAGNOSIS — S0993XA Unspecified injury of face, initial encounter: Secondary | ICD-10-CM | POA: Diagnosis not present

## 2019-12-31 LAB — BASIC METABOLIC PANEL
Anion gap: 8 (ref 5–15)
BUN: 16 mg/dL (ref 8–23)
CO2: 24 mmol/L (ref 22–32)
Calcium: 8.8 mg/dL — ABNORMAL LOW (ref 8.9–10.3)
Chloride: 109 mmol/L (ref 98–111)
Creatinine, Ser: 1.39 mg/dL — ABNORMAL HIGH (ref 0.61–1.24)
GFR calc Af Amer: >60 mL/min (ref >60)
GFR calc non Af Amer: 53 mL/min — ABNORMAL LOW (ref >60)
Glucose, Bld: 196 mg/dL — ABNORMAL HIGH (ref 70–99)
Potassium: 3.9 mmol/L (ref 3.5–5.1)
Sodium: 141 mmol/L (ref 135–145)

## 2019-12-31 LAB — CBC
HCT: 35 % — ABNORMAL LOW (ref 39.0–52.0)
Hemoglobin: 10.8 g/dL — ABNORMAL LOW (ref 13.0–17.0)
MCH: 23.9 pg — ABNORMAL LOW (ref 26.0–34.0)
MCHC: 30.9 g/dL (ref 30.0–36.0)
MCV: 77.6 fL — ABNORMAL LOW (ref 80.0–100.0)
Platelets: 166 10*3/uL (ref 150–400)
RBC: 4.51 MIL/uL (ref 4.22–5.81)
RDW: 17 % — ABNORMAL HIGH (ref 11.5–15.5)
WBC: 4.5 10*3/uL (ref 4.0–10.5)
nRBC: 0 % (ref 0.0–0.2)

## 2019-12-31 NOTE — ED Notes (Signed)
Pt does have hx of stroke and left sided deficit due to stroke

## 2019-12-31 NOTE — ED Triage Notes (Signed)
Comes from home where he reports he was eating cereal and began to choke, attempted to stand up and blacked out, this resulting in a fall where pt reports he fell and hit his head on the floor. Pt states his nose was bleeding following the fall and there is an abraison noted on the end of the nose at this time. Pt unsure if he takes blood thinners at this time but has history of stroke

## 2020-01-01 ENCOUNTER — Observation Stay
Admission: EM | Admit: 2020-01-01 | Discharge: 2020-01-02 | Disposition: A | Discharge status: Home / Self Care | Payer: Medicare HMO | Attending: Internal Medicine | Admitting: Internal Medicine

## 2020-01-01 ENCOUNTER — Inpatient Hospital Stay
Admit: 2020-01-01 | Discharge: 2020-01-01 | Disposition: A | Discharge status: Home / Self Care | Payer: Medicare HMO | Attending: Internal Medicine | Admitting: Internal Medicine

## 2020-01-01 DIAGNOSIS — R569 Unspecified convulsions: Secondary | ICD-10-CM | POA: Diagnosis not present

## 2020-01-01 DIAGNOSIS — N182 Chronic kidney disease, stage 2 (mild): Secondary | ICD-10-CM

## 2020-01-01 DIAGNOSIS — G40909 Epilepsy, unspecified, not intractable, without status epilepticus: Secondary | ICD-10-CM

## 2020-01-01 DIAGNOSIS — E1169 Type 2 diabetes mellitus with other specified complication: Secondary | ICD-10-CM | POA: Diagnosis not present

## 2020-01-01 DIAGNOSIS — S02412A LeFort II fracture, initial encounter for closed fracture: Secondary | ICD-10-CM

## 2020-01-01 DIAGNOSIS — N183 Chronic kidney disease, stage 3 unspecified: Secondary | ICD-10-CM | POA: Diagnosis present

## 2020-01-01 DIAGNOSIS — R55 Syncope and collapse: Secondary | ICD-10-CM

## 2020-01-01 DIAGNOSIS — I10 Essential (primary) hypertension: Secondary | ICD-10-CM | POA: Diagnosis present

## 2020-01-01 DIAGNOSIS — Z8673 Personal history of transient ischemic attack (TIA), and cerebral infarction without residual deficits: Secondary | ICD-10-CM

## 2020-01-01 DIAGNOSIS — S02412S LeFort II fracture, sequela: Secondary | ICD-10-CM

## 2020-01-01 DIAGNOSIS — E785 Hyperlipidemia, unspecified: Secondary | ICD-10-CM

## 2020-01-01 LAB — SARS CORONAVIRUS 2 BY RT PCR (HOSPITAL ORDER, PERFORMED IN ~~LOC~~ HOSPITAL LAB): SARS Coronavirus 2: NEGATIVE

## 2020-01-01 LAB — GLUCOSE, CAPILLARY
Glucose-Capillary: 95 mg/dL (ref 70–99)
Glucose-Capillary: 98 mg/dL (ref 70–99)
Glucose-Capillary: 86 mg/dL (ref 70–99)
Glucose-Capillary: 118 mg/dL — ABNORMAL HIGH (ref 70–99)
Glucose-Capillary: 98 mg/dL (ref 70–99)

## 2020-01-01 LAB — ECHOCARDIOGRAM COMPLETE
AR max vel: 3.46 cm2
AV Area VTI: 3.1 cm2
AV Area mean vel: 3.6 cm2
AV Mean grad: 4 mmHg
AV Peak grad: 6.9 mmHg
Ao pk vel: 1.31 m/s
Area-P 1/2: 3.72 cm2
S' Lateral: 3.19 cm

## 2020-01-01 LAB — HEMOGLOBIN A1C
Hgb A1c MFr Bld: 6.6 % — ABNORMAL HIGH (ref 4.8–5.6)
Mean Plasma Glucose: 142.72 mg/dL

## 2020-01-01 LAB — TROPONIN I (HIGH SENSITIVITY): Troponin I (High Sensitivity): 5 ng/L (ref <18)

## 2020-01-01 MED ORDER — TAMSULOSIN HCL 0.4 MG PO CAPS
0.4000 mg | ORAL_CAPSULE | Freq: Every day | ORAL | Status: DC
  Administered 2020-01-01 – 2020-01-02 (×2): 0.4 mg via ORAL
  Filled 2020-01-01 (×2): qty 1

## 2020-01-01 MED ORDER — MORPHINE SULFATE (PF) 2 MG/ML IV SOLN
2.0000 mg | Freq: Once | INTRAVENOUS | Status: AC
  Administered 2020-01-01: 2 mg via INTRAVENOUS
  Filled 2020-01-01: qty 1

## 2020-01-01 MED ORDER — ATORVASTATIN CALCIUM 20 MG PO TABS
40.0000 mg | ORAL_TABLET | Freq: Every day | ORAL | Status: DC
  Administered 2020-01-01 – 2020-01-02 (×2): 40 mg via ORAL
  Filled 2020-01-01 (×2): qty 2

## 2020-01-01 MED ORDER — INSULIN GLARGINE 100 UNIT/ML ~~LOC~~ SOLN
25.0000 [IU] | Freq: Every day | SUBCUTANEOUS | Status: DC
  Administered 2020-01-01: 25 [IU] via SUBCUTANEOUS
  Filled 2020-01-01 (×2): qty 0.25

## 2020-01-01 MED ORDER — ENOXAPARIN SODIUM 40 MG/0.4ML ~~LOC~~ SOLN
40.0000 mg | SUBCUTANEOUS | Status: DC
  Administered 2020-01-01 – 2020-01-02 (×2): 40 mg via SUBCUTANEOUS
  Filled 2020-01-01 (×3): qty 0.4

## 2020-01-01 MED ORDER — CEPHALEXIN 500 MG PO CAPS
500.0000 mg | ORAL_CAPSULE | Freq: Three times a day (TID) | ORAL | Status: DC
  Administered 2020-01-01 – 2020-01-02 (×4): 500 mg via ORAL
  Filled 2020-01-01 (×4): qty 1

## 2020-01-01 MED ORDER — ACETAMINOPHEN 325 MG PO TABS
650.0000 mg | ORAL_TABLET | Freq: Four times a day (QID) | ORAL | Status: DC | PRN
  Administered 2020-01-01: 650 mg via ORAL
  Filled 2020-01-01: qty 2

## 2020-01-01 MED ORDER — LAMOTRIGINE 100 MG PO TABS
200.0000 mg | ORAL_TABLET | Freq: Two times a day (BID) | ORAL | Status: DC
  Administered 2020-01-01 – 2020-01-02 (×3): 200 mg via ORAL
  Filled 2020-01-01 (×3): qty 2

## 2020-01-01 MED ORDER — INSULIN ASPART 100 UNIT/ML ~~LOC~~ SOLN: 0.0000 [IU] | Freq: Three times a day (TID) | SUBCUTANEOUS | Status: DC

## 2020-01-01 MED ORDER — GABAPENTIN 300 MG PO CAPS
600.0000 mg | ORAL_CAPSULE | Freq: Three times a day (TID) | ORAL | Status: DC
  Administered 2020-01-01 – 2020-01-02 (×4): 600 mg via ORAL
  Filled 2020-01-01 (×4): qty 2

## 2020-01-01 MED ORDER — DULOXETINE HCL 30 MG PO CPEP
60.0000 mg | ORAL_CAPSULE | Freq: Every day | ORAL | Status: DC
  Administered 2020-01-01 – 2020-01-02 (×2): 60 mg via ORAL
  Filled 2020-01-01 (×2): qty 2

## 2020-01-01 MED ORDER — ONDANSETRON HCL 4 MG/2ML IJ SOLN
4.0000 mg | Freq: Once | INTRAMUSCULAR | Status: AC
  Administered 2020-01-01: 4 mg via INTRAVENOUS
  Filled 2020-01-01: qty 2

## 2020-01-01 MED ORDER — LORAZEPAM 2 MG/ML IJ SOLN: 2.0000 mg | INTRAMUSCULAR | Status: DC | PRN

## 2020-01-01 MED ORDER — METFORMIN HCL 500 MG PO TABS
500.0000 mg | ORAL_TABLET | Freq: Two times a day (BID) | ORAL | Status: DC
  Administered 2020-01-01 – 2020-01-02 (×2): 500 mg via ORAL
  Filled 2020-01-01 (×2): qty 1

## 2020-01-01 MED ORDER — INSULIN ASPART 100 UNIT/ML ~~LOC~~ SOLN: 0.0000 [IU] | Freq: Every day | SUBCUTANEOUS | Status: DC

## 2020-01-01 MED ORDER — SODIUM CHLORIDE 0.9% FLUSH: 3.0000 mL | Freq: Two times a day (BID) | INTRAVENOUS | Status: DC

## 2020-01-01 MED ORDER — DOCUSATE SODIUM 100 MG PO CAPS
100.0000 mg | ORAL_CAPSULE | Freq: Every morning | ORAL | Status: DC
  Administered 2020-01-01 – 2020-01-02 (×2): 100 mg via ORAL
  Filled 2020-01-01 (×2): qty 1

## 2020-01-01 MED ORDER — NORTRIPTYLINE HCL 25 MG PO CAPS
50.0000 mg | ORAL_CAPSULE | Freq: Every day | ORAL | Status: DC
  Administered 2020-01-01: 50 mg via ORAL
  Filled 2020-01-01 (×2): qty 2

## 2020-01-01 MED ORDER — DULOXETINE HCL 30 MG PO CPEP
30.0000 mg | ORAL_CAPSULE | Freq: Every day | ORAL | Status: DC
  Administered 2020-01-01 – 2020-01-02 (×2): 30 mg via ORAL
  Filled 2020-01-01 (×2): qty 1

## 2020-01-01 MED ORDER — SODIUM CHLORIDE 0.9 % IV BOLUS
1000.0000 mL | Freq: Once | INTRAVENOUS | Status: AC
  Administered 2020-01-01: 1000 mL via INTRAVENOUS

## 2020-01-01 NOTE — ED Notes (Signed)
Pt used his call bell, states he thinks he may have urinated on himself, pt assisted to sit on side of bed, underwear are damp, however bed is not soiled, pt assist to lay back down and soiled shorts removed and gown placed on pt, chux placed under patient and warm blanket given. Pt redirected to the call bell, and pt offered his thanks for my assistance

## 2020-01-01 NOTE — ED Notes (Signed)
Called lab who stated they will add Trop on now.

## 2020-01-01 NOTE — Progress Notes (Signed)
Patient ID: Daniel [name], male   DOB: [birth year], 64 y.o.   MRN: [id number] [facility] PROGRESS NOTE  Daniel [name] VOZ:[id number] DOB: [birth year] DOA: 01/01/2020 PCP: Daniel Ruths, MD  HPI/Subjective: Patient stated he was choking on some food and was coughing and ended up falling on the ground.  His hands were down by his side and he ended up hitting his head.  He was found to have facial bone fractures.  He has a little bit of a headache.  Objective: Vitals:   01/01/20 0829 01/01/20 1149  BP: (!) 148/95 (!) 145/93  Pulse: 83 78  Resp: 18 18  Temp: 98.2 F (36.8 C) 98.2 F (36.8 C)  SpO2:  98%    Intake/Output Summary (Last 24 hours) at 01/01/2020 1321 Last data filed at 01/01/2020 1149 Gross per 24 hour  Intake --  Output 100 ml  Net -100 ml   Filed Weights   12/31/19 2220 01/01/20 0829  Weight: 93 kg 103.2 kg    ROS: Review of Systems  Constitutional: Positive for malaise/fatigue.  Respiratory: Negative for shortness of breath.   Cardiovascular: Negative for chest pain.  Gastrointestinal: Negative for abdominal pain, nausea and vomiting.  Neurological: Positive for focal weakness and headaches.   Exam: Physical Exam HENT:     Nose: No mucosal edema.     Mouth/Throat:     Pharynx: No oropharyngeal exudate.  Eyes:     General: Lids are normal.     Conjunctiva/sclera: Conjunctivae normal.     Pupils: Pupils are equal, round, and reactive to light.  Cardiovascular:     Rate and Rhythm: Normal rate and regular rhythm.     Heart sounds: Normal heart sounds, S1 normal and S2 normal.  Pulmonary:     Breath sounds: Examination of the right-lower field reveals decreased breath sounds. Examination of the left-lower field reveals decreased breath sounds. Decreased breath sounds present. No wheezing, rhonchi or rales.  Abdominal:     Palpations: Abdomen is soft.     Tenderness: There is no abdominal tenderness.  Musculoskeletal:     Right ankle: Swelling  present.     Left ankle: Swelling present.  Skin:    General: Skin is warm.     Findings: No rash.  Neurological:     Mental Status: He is alert and oriented to person, place, and time.     Comments: Left arm can hardly flex or extend at the elbow.  Able to slightly flex at the left shoulder.  Right fingers contracted but able to flex only slightly. Able to straight leg raise       Data Reviewed: Basic Metabolic Panel: Recent Labs  Lab 12/31/19 2229  NA 141  K 3.9  CL 109  CO2 24  GLUCOSE 196*  BUN 16  CREATININE 1.39*  CALCIUM 8.8*   CBC: Recent Labs  Lab 12/31/19 2229  WBC 4.5  HGB 10.8*  HCT 35.0*  MCV 77.6*  PLT 166   CBG: Recent Labs  Lab 01/01/20 0733 01/01/20 0829 01/01/20 1147  GLUCAP 98 95 86    Recent Results (from the past 240 hour(s))  SARS Coronavirus 2 by RT PCR (hospital order, performed in [facility] hospital lab) Nasopharyngeal Nasopharyngeal Swab     Status: None   Collection Time: 01/01/20  3:06 AM   Specimen: Nasopharyngeal Swab  Result Value Ref Range Status   SARS Coronavirus 2 NEGATIVE NEGATIVE Final    Comment: (NOTE) SARS-CoV-2 target nucleic acids  are NOT DETECTED.  The SARS-CoV-2 RNA is generally detectable in upper and lower respiratory specimens during the acute phase of infection. The lowest concentration of SARS-CoV-2 viral copies this assay can detect is 250 copies / mL. A negative result does not preclude SARS-CoV-2 infection and should not be used as the sole basis for treatment or other patient management decisions.  A negative result may occur with improper specimen collection / handling, submission of specimen other than nasopharyngeal swab, presence of viral mutation(s) within the areas targeted by this assay, and inadequate number of viral copies (<250 copies / mL). A negative result must be combined with clinical observations, patient history, and epidemiological information.  Fact Sheet for Patients:    StrictlyIdeas.no  Fact Sheet for Healthcare Providers: BankingDealers.co.za  This test is not yet approved or  cleared by the [place] FDA and has been authorized for detection and/or diagnosis of SARS-CoV-2 by FDA under an Emergency Use Authorization (EUA).  This EUA will remain in effect (meaning this test can be used) for the duration of the COVID-19 declaration under Section 564(b)(1) of the Act, 21 U.S.C. section 360bbb-3(b)(1), unless the authorization is terminated or revoked sooner.  Performed at [facility], [address] [place], [place] [address]      Studies: CT HEAD WO CONTRAST  Result Date: 12/31/2019 CLINICAL DATA:  Facial trauma. Nasal abrasion and nose bleed. EXAM: CT HEAD WITHOUT CONTRAST TECHNIQUE: Contiguous axial images were obtained from the base of the skull through the vertex without intravenous contrast. COMPARISON:  Head CT 07/12/2019 FINDINGS: Brain: Stable appearance of moderate to large remote right MCA infarct. No intracranial hemorrhage, mass effect, or midline shift. Ex vacuo dilatation of the right lateral ventricle, unchanged. The basilar cisterns are patent. No evidence of territorial infarct or acute ischemia. No extra-axial or intracranial fluid collection. Vascular: No hyperdense vessel. Skull: No fracture or focal lesion. Sinuses/Orbits: Assessed on concurrent face CT, reported separately. The mastoid air cells are clear. Other: None. IMPRESSION: 1. No acute intracranial abnormality. No skull fracture. 2. Stable appearance of remote right MCA infarct. Electronically Signed   By: Daniel [name] M.D.   On: 12/31/2019 23:07   ECHOCARDIOGRAM COMPLETE  Result Date: 01/01/2020    ECHOCARDIOGRAM REPORT   Patient Name:   Daniel [name] Date of Exam: 01/01/2020 Medical Rec #:  [id number]        Height:       66.0 in Accession #:    [id number]       Weight:       205.0 lb Date of Birth:  07/20/1955          BSA:          2.021 m Patient Age:    89 years         BP:           148/95 mmHg Patient Gender: M                HR:           82 bpm. Exam Location:  [facility] Procedure: 2D Echo, Cardiac Doppler and Color Doppler Indications:     R55 Syncope  History:         Patient has prior history of Echocardiogram examinations.                  Stroke; Risk Factors:Hypertension and Diabetes.  Sonographer:     Charmayne Sheer RDCS (AE) Referring Phys:  [id number] Waldemar Dickens  DUNCAN Diagnosing Phys: Yolonda Kida MD  Sonographer Comments: No subcostal window and suboptimal parasternal window. Image acquisition challenging due to patient body habitus. IMPRESSIONS  1. Left ventricular ejection fraction, by estimation, is 60 to 65%. The left ventricle has normal function. The left ventricle has no regional wall motion abnormalities. Left ventricular diastolic parameters are consistent with Grade I diastolic dysfunction (impaired relaxation).  2. Right ventricular systolic function is normal. The right ventricular size is normal.  3. The mitral valve is normal in structure. Mild mitral valve regurgitation.  4. The aortic valve is normal in structure. Aortic valve regurgitation is not visualized. FINDINGS  Left Ventricle: Left ventricular ejection fraction, by estimation, is 60 to 65%. The left ventricle has normal function. The left ventricle has no regional wall motion abnormalities. The left ventricular internal cavity size was normal in size. There is  no left ventricular hypertrophy. Left ventricular diastolic parameters are consistent with Grade I diastolic dysfunction (impaired relaxation). Right Ventricle: The right ventricular size is normal. No increase in right ventricular wall thickness. Right ventricular systolic function is normal. Left Atrium: Left atrial size was normal in size. Right Atrium: Right atrial size was normal in size. Pericardium: There is no evidence of pericardial effusion. Mitral Valve: The mitral valve is  normal in structure. Mild mitral valve regurgitation. MV peak gradient, 3.1 mmHg. The mean mitral valve gradient is 2.0 mmHg. Tricuspid Valve: The tricuspid valve is normal in structure. Tricuspid valve regurgitation is trivial. Aortic Valve: The aortic valve is normal in structure. Aortic valve regurgitation is not visualized. Aortic valve mean gradient measures 4.0 mmHg. Aortic valve peak gradient measures 6.9 mmHg. Aortic valve area, by VTI measures 3.10 cm. Pulmonic Valve: The pulmonic valve was normal in structure. Pulmonic valve regurgitation is trivial. Aorta: The aortic root is normal in size and structure. IAS/Shunts: No atrial level shunt detected by color flow Doppler.  LEFT VENTRICLE PLAX 2D LVIDd:         5.01 cm  Diastology LVIDs:         3.19 cm  LV e' lateral:   10.10 cm/s LV PW:         1.08 cm  LV E/e' lateral: 7.3 LV IVS:        0.83 cm  LV e' medial:    7.40 cm/s LVOT diam:     2.30 cm  LV E/e' medial:  10.0 LV SV:         74 LV SV Index:   37 LVOT Area:     4.15 cm  RIGHT VENTRICLE RV Basal diam:  3.42 cm LEFT ATRIUM             Index       RIGHT ATRIUM           Index LA diam:        3.30 cm 1.63 cm/m  RA Area:     14.60 cm LA Vol (A2C):   31.3 ml 15.49 ml/m RA Volume:   36.30 ml  17.96 ml/m LA Vol (A4C):   28.6 ml 14.15 ml/m LA Biplane Vol: 30.2 ml 14.94 ml/m  AORTIC VALVE                   PULMONIC VALVE AV Area (Vmax):    3.46 cm    PV Vmax:       0.79 m/s AV Area (Vmean):   3.60 cm    PV Vmean:      58.300  cm/s AV Area (VTI):     3.10 cm    PV VTI:        0.145 m AV Vmax:           131.00 cm/s PV Peak grad:  2.5 mmHg AV Vmean:          87.000 cm/s PV Mean grad:  2.0 mmHg AV VTI:            0.240 m AV Peak Grad:      6.9 mmHg AV Mean Grad:      4.0 mmHg LVOT Vmax:         109.00 cm/s LVOT Vmean:        75.400 cm/s LVOT VTI:          0.179 m LVOT/AV VTI ratio: 0.75  AORTA Ao Root diam: 3.00 cm MITRAL VALVE MV Area (PHT): 3.72 cm    SHUNTS MV Peak grad:  3.1 mmHg    Systemic VTI:   0.18 m MV Mean grad:  2.0 mmHg    Systemic Diam: 2.30 cm MV Vmax:       0.88 m/s MV Vmean:      59.5 cm/s MV Decel Time: 204 msec MV E velocity: 73.70 cm/s MV A velocity: 99.40 cm/s MV E/A ratio:  0.74 Dwayne D Callwood MD Electronically signed by Yolonda Kida MD Signature Date/Time: 01/01/2020/12:55:46 PM    Final    CT Maxillofacial Wo Contrast  Result Date: 12/31/2019 CLINICAL DATA:  Facial trauma. EXAM: CT MAXILLOFACIAL WITHOUT CONTRAST TECHNIQUE: Multidetector CT imaging of the maxillofacial structures was performed. Multiplanar CT image reconstructions were also generated. COMPARISON:  Head CT 07/12/2019, no prior face CT available. FINDINGS: Osseous: Mildly displaced bilateral nasal bone fractures. No fracture of the nasal septum. There is a nondisplaced fracture involving the left pterygoid plate. Possible nondisplaced left zygomatic fracture. Mandibles are intact. Temporomandibular joints are congruent. Edentulous of upper teeth. Many absent lower teeth. Periapical lucency noted about in situ left molar. Orbits: Inferior medial left orbital fracture appears remote, also seen on 12/24/2016 head CT. Right orbit is intact. No evidence of globe injury. Sinuses: Left maxillary sinus fracture involves the anterior, lateral, and posteromedial walls. Fracture abuts the alveolar ridge. There is air in the adjacent soft tissues and sinus fluid level/hemosinus. Acute fracture through the anterior and inferior/posterolateral walls of the right maxillary sinus with associated soft tissue air anteriorly and right maxillary hemosinus. The remaining paranasal sinuses are well aerated and clear. Soft tissues: Nasal soft tissue edema. Scattered soft tissue air adjacent to the sinus fracture site. Limited intracranial: Assessed on concurrent head CT, reported separately. IMPRESSION: 1. LeFort type facial fractures on the left with fractures of the maxillary sinus, anterior alveolar ridge, pterygoid plate and left nasal  bone, likely a LeFort 1/2. Possible nondisplaced left zygomatic arch fracture. 2. Right nasal bone fracture. Acute fracture through the anterior and posterolateral wall of the right maxillary sinus. No pterygoid plate involvement on the right. 3. Remote left orbital fracture. Electronically Signed   By: Daniel [name] M.D.   On: 12/31/2019 23:16    Scheduled Meds: . atorvastatin  40 mg Oral Daily  . cephALEXin  500 mg Oral Q8H  . docusate sodium  100 mg Oral q morning - 10a  . DULoxetine  30 mg Oral Daily  . DULoxetine  60 mg Oral Daily  . enoxaparin (LOVENOX) injection  40 mg Subcutaneous Q24H  . gabapentin  600 mg Oral TID  . insulin aspart  0-15 Units Subcutaneous TID WC  . insulin aspart  0-5 Units Subcutaneous QHS  . insulin glargine  25 Units Subcutaneous QHS  . lamoTRIgine  200 mg Oral BID  . metFORMIN  500 mg Oral BID WC  . nortriptyline  50 mg Oral QHS  . tamsulosin  0.4 mg Oral Daily   Continuous Infusions:  Assessment/Plan:  1. Vasovagal syncope from coughing and choking.  Patient normally does not have any problem swallowing.  Continue to watch for arrhythmias overnight.  Seen by neurology and they do not think this is seizure. 2. Seizure disorder continue lamotrigine 3. LeFort II fracture subsequent evaluation.  Follow-up with [facility].  Empiric Keflex. 4. Type 2 diabetes mellitus with hyperlipidemia on atorvastatin glargine insulin and sliding scale. 5. Chronic kidney disease stage II 6. History of ischemic stroke with left-sided weakness.  Physical therapy evaluation     Code Status:     Code Status Orders  (From admission, onward)         Start     Ordered   01/01/20 0232  Full code  Continuous        01/01/20 0245        Code Status History    Date Active Date Inactive Code Status Order ID Comments User Context   07/12/2019 1747 07/14/2019 2122 Full Code [id number]  Ivor Costa, MD Inpatient   06/12/2019 1539 06/17/2019 0057 Full Code [id number]  Sidney Ace, MD ED   11/30/2018 1707 12/02/2018 1423 Full Code [id number]  Fritzi Mandes, MD Inpatient   10/27/2014 1201 11/02/2014 1319 Full Code [id number]  Max Sane, MD Inpatient   Advance Care Planning Activity     Family Communication: Wife at the bedside Disposition Plan: Status is: Inpatient  Dispo: The patient is from: Home              Anticipated d/c is to: Home              Anticipated d/c date is: 01/02/2020              Patient currently being monitored for vasovagal syncope.  Checking orthostatic vital signs and monitoring on telemetry overnight.  Consultants:  Neurology  Time spent: 27 minutes  [place]

## 2020-01-01 NOTE — H&P (Addendum)
History and Physical    Daniel [name] ZOX:[id number] DOB: 02-11-1956 DOA: 01/01/2020  PCP: Kirk Ruths, MD   Patient coming from: home  I have personally briefly reviewed patient's old medical records in [place]  Chief Complaint: Blacked out  HPI: Daniel [name] is a 64 y.o. male with medical history significant for HTN, DM,CKD 3b history of seizures and CVA with left-sided weakness, who was brought to the emergency room after he passed out at home falling forward onto his face onto the floor.  Patient was eating cereal and apparently began to choke and coughed a couple times and then  passed out falling forward onto his face on the floor losing consciousness.  He denied feeling unwell prior to starting to choke.  Uncertain duration of LOC.  Patient awake and alert by arrival ED Course: On arrival, vitals within normal limits.  Patient had a mild nosebleed.  Blood work notable for hemoglobin 10.8, creatinine 1.39 which is his baseline, troponin 5 maxillofacial CT showing a LeFort I/II fracture.  Head CT with no acute intracranial findings.  Old right MCA infarct.  The emergency room provider spoke with ENT, Dr. Richardson Landry who recommended oral antibiotics pain control with referral to [facility] for follow-up Review of Systems: As per HPI otherwise all other systems on review of systems negative.    Past Medical History:  Diagnosis Date  . Diabetes mellitus without complication ([place])   . Hypertension   . Pancreatitis   . Seizures ([place])   . Stroke ([place])   . Stroke [facility]) 2016    Past Surgical History:  Procedure Laterality Date  . COLONOSCOPY WITH PROPOFOL N/A 10/07/2019   Procedure: COLONOSCOPY WITH PROPOFOL;  Surgeon: Toledo, Benay Pike, MD;  Location: [facility] ENDOSCOPY;  Service: Gastroenterology;  Laterality: N/A;  . ESOPHAGOGASTRODUODENOSCOPY    . ESOPHAGOGASTRODUODENOSCOPY (EGD) WITH PROPOFOL N/A 10/07/2019   Procedure: ESOPHAGOGASTRODUODENOSCOPY (EGD) WITH PROPOFOL;   Surgeon: Toledo, Benay Pike, MD;  Location: [facility] ENDOSCOPY;  Service: Gastroenterology;  Laterality: N/A;  . TEE WITHOUT CARDIOVERSION N/A 11/01/2014   Procedure: TRANSESOPHAGEAL ECHOCARDIOGRAM (TEE);  Surgeon: Wellington Hampshire, MD;  Location: [facility] ORS;  Service: Cardiovascular;  Laterality: N/A;     reports that he quit smoking about 5 years ago. His smoking use included cigarettes. He has a 22.50 pack-year smoking history. He has never used smokeless tobacco. He reports that he does not drink alcohol and does not use drugs.  No Known Allergies  Family History  Problem Relation Age of Onset  . CAD Sister   . Diabetes Sister   . Hypertension Sister   . Diabetes Sister   . Prostate cancer Father   . Diabetes Mother   . Bladder Cancer Neg Hx   . Kidney cancer Neg Hx       Prior to Admission medications   Medication Sig Start Date End Date Taking? Authorizing Provider  atorvastatin (LIPITOR) 40 MG tablet Take 40 mg by mouth daily. 08/17/18   [provider]  blood glucose meter kit and supplies KIT Dispense based on patient and insurance preference. Use up to four times daily as directed. (FOR ICD-9 250.00, 250.01). 06/16/19   Annita Brod, MD  Docusate Sodium (DSS) 100 MG CAPS Take 100 mg by mouth in the morning and at bedtime. 08/04/19   [provider]  DULoxetine (CYMBALTA) 30 MG capsule Take 30 mg by mouth daily. Take with 60 mg 10/21/18   [provider]  DULoxetine (CYMBALTA) 60 MG  capsule Take 60 mg by mouth daily. Takes with 30 mg 10/24/18   [provider]  gabapentin (NEURONTIN) 300 MG capsule Take 600 mg by mouth 3 (three) times daily.    [provider]  insulin glargine (LANTUS) 100 UNIT/ML injection Inject 0.2 mLs (20 Units total) into the skin at bedtime. Patient taking differently: Inject 25 Units into the skin at bedtime.  06/16/19   Annita Brod, MD  lamoTRIgine (LAMICTAL) 200 MG tablet Take 1 tablet (200 mg total) by  mouth 2 (two) times daily. 07/13/19   Lavina Hamman, MD  metFORMIN (GLUCOPHAGE) 500 MG tablet Take 500 mg by mouth 2 (two) times daily. 06/22/19   [provider]  nortriptyline (PAMELOR) 50 MG capsule Take 50 mg by mouth at bedtime.    [provider]  tamsulosin (FLOMAX) 0.4 MG CAPS capsule Take 1 capsule (0.4 mg total) by mouth daily. 07/24/17   Hollice Espy, MD    Physical Exam: Vitals:   12/31/19 2219 12/31/19 2220  BP: (!) 156/94   Pulse: 85   Resp: 20   Temp:  98.2 F (36.8 C)  TempSrc: Oral Oral  SpO2: 98%   Weight:  93 kg  Height:  '5\' 6"'$  (1.676 m)     Vitals:   12/31/19 2219 12/31/19 2220  BP: (!) 156/94   Pulse: 85   Resp: 20   Temp:  98.2 F (36.8 C)  TempSrc: Oral Oral  SpO2: 98%   Weight:  93 kg  Height:  '5\' 6"'$  (1.676 m)      Constitutional: Alert and oriented x 3 . Not in any apparent distress HEENT:      Head:  Abrasions on forehead  .  Tenderness on palpation over cheekbones  with mild apparent deformity    Eyes: PERLA, EOMI, Conjunctivae are normal. Sclera is non-icteric.       Mouth/Throat: Mucous membranes are moist.       Neck: Supple with no signs of meningismus. Cardiovascular: Regular rate and rhythm. No murmurs, gallops, or rubs. 2+ symmetrical distal pulses are present . No JVD. No LE edema Respiratory: Respiratory effort normal .Lungs sounds clear bilaterally. No wheezes, crackles, or rhonchi.  Gastrointestinal: Soft, non tender, and non distended with positive bowel sounds. No rebound or guarding. Genitourinary: No CVA tenderness. Musculoskeletal: Nontender with normal range of motion in all extremities. No cyanosis, or erythema of extremities. Neurologic: Normal speech and language. Face is symmetric.  Old left hemiparesis skin: Skin is warm, dry.  No rash or ulcers Psychiatric: Mood and affect are normal Speech and behavior are normal   Labs on Admission: I have personally reviewed following labs and imaging  studies  CBC: Recent Labs  Lab 12/31/19 2229  WBC 4.5  HGB 10.8*  HCT 35.0*  MCV 77.6*  PLT 517   Basic Metabolic Panel: Recent Labs  Lab 12/31/19 2229  NA 141  K 3.9  CL 109  CO2 24  GLUCOSE 196*  BUN 16  CREATININE 1.39*  CALCIUM 8.8*   GFR: Estimated Creatinine Clearance: 57.3 mL/min (A) (by C-G formula based on SCr of 1.39 mg/dL (H)). Liver Function Tests: No results for input(s): AST, ALT, ALKPHOS, BILITOT, PROT, ALBUMIN in the last 168 hours. No results for input(s): LIPASE, AMYLASE in the last 168 hours. No results for input(s): AMMONIA in the last 168 hours. Coagulation Profile: No results for input(s): INR, PROTIME in the last 168 hours. Cardiac Enzymes: No results for input(s): CKTOTAL, CKMB, CKMBINDEX,  TROPONINI in the last 168 hours. BNP (last 3 results) No results for input(s): PROBNP in the last 8760 hours. HbA1C: No results for input(s): HGBA1C in the last 72 hours. CBG: No results for input(s): GLUCAP in the last 168 hours. Lipid Profile: No results for input(s): CHOL, HDL, LDLCALC, TRIG, CHOLHDL, LDLDIRECT in the last 72 hours. Thyroid Function Tests: No results for input(s): TSH, T4TOTAL, FREET4, T3FREE, THYROIDAB in the last 72 hours. Anemia Panel: No results for input(s): VITAMINB12, FOLATE, FERRITIN, TIBC, IRON, RETICCTPCT in the last 72 hours. Urine analysis:    Component Value Date/Time   COLORURINE YELLOW (A) 07/12/2019 1210   APPEARANCEUR CLEAR (A) 07/12/2019 1210   APPEARANCEUR Clear 10/04/2013 1949   LABSPEC 1.015 07/12/2019 1210   LABSPEC 1.057 10/04/2013 1949   PHURINE 6.0 07/12/2019 1210   GLUCOSEU NEGATIVE 07/12/2019 1210   GLUCOSEU Negative 10/04/2013 1949   HGBUR SMALL (A) 07/12/2019 1210   BILIRUBINUR NEGATIVE 07/12/2019 1210   BILIRUBINUR Negative 10/04/2013 1949   KETONESUR NEGATIVE 07/12/2019 1210   PROTEINUR 30 (A) 07/12/2019 1210   NITRITE NEGATIVE 07/12/2019 1210   LEUKOCYTESUR NEGATIVE 07/12/2019 1210    LEUKOCYTESUR Negative 10/04/2013 1949    Radiological Exams on Admission: CT HEAD WO CONTRAST  Result Date: 12/31/2019 CLINICAL DATA:  Facial trauma. Nasal abrasion and nose bleed. EXAM: CT HEAD WITHOUT CONTRAST TECHNIQUE: Contiguous axial images were obtained from the base of the skull through the vertex without intravenous contrast. COMPARISON:  Head CT 07/12/2019 FINDINGS: Brain: Stable appearance of moderate to large remote right MCA infarct. No intracranial hemorrhage, mass effect, or midline shift. Ex vacuo dilatation of the right lateral ventricle, unchanged. The basilar cisterns are patent. No evidence of territorial infarct or acute ischemia. No extra-axial or intracranial fluid collection. Vascular: No hyperdense vessel. Skull: No fracture or focal lesion. Sinuses/Orbits: Assessed on concurrent face CT, reported separately. The mastoid air cells are clear. Other: None. IMPRESSION: 1. No acute intracranial abnormality. No skull fracture. 2. Stable appearance of remote right MCA infarct. Electronically Signed   By: Keith Rake M.D.   On: 12/31/2019 23:07   CT Maxillofacial Wo Contrast  Result Date: 12/31/2019 CLINICAL DATA:  Facial trauma. EXAM: CT MAXILLOFACIAL WITHOUT CONTRAST TECHNIQUE: Multidetector CT imaging of the maxillofacial structures was performed. Multiplanar CT image reconstructions were also generated. COMPARISON:  Head CT 07/12/2019, no prior face CT available. FINDINGS: Osseous: Mildly displaced bilateral nasal bone fractures. No fracture of the nasal septum. There is a nondisplaced fracture involving the left pterygoid plate. Possible nondisplaced left zygomatic fracture. Mandibles are intact. Temporomandibular joints are congruent. Edentulous of upper teeth. Many absent lower teeth. Periapical lucency noted about in situ left molar. Orbits: Inferior medial left orbital fracture appears remote, also seen on 12/24/2016 head CT. Right orbit is intact. No evidence of globe injury.  Sinuses: Left maxillary sinus fracture involves the anterior, lateral, and posteromedial walls. Fracture abuts the alveolar ridge. There is air in the adjacent soft tissues and sinus fluid level/hemosinus. Acute fracture through the anterior and inferior/posterolateral walls of the right maxillary sinus with associated soft tissue air anteriorly and right maxillary hemosinus. The remaining paranasal sinuses are well aerated and clear. Soft tissues: Nasal soft tissue edema. Scattered soft tissue air adjacent to the sinus fracture site. Limited intracranial: Assessed on concurrent head CT, reported separately. IMPRESSION: 1. LeFort type facial fractures on the left with fractures of the maxillary sinus, anterior alveolar ridge, pterygoid plate and left nasal bone, likely a LeFort 1/2. Possible nondisplaced left  zygomatic arch fracture. 2. Right nasal bone fracture. Acute fracture through the anterior and posterolateral wall of the right maxillary sinus. No pterygoid plate involvement on the right. 3. Remote left orbital fracture. Electronically Signed   By: Keith Rake M.D.   On: 12/31/2019 23:16    EKG: Independently reviewed. Interpretation : NSR with no acute ST-T wave changes  Assessment/Plan 64 year old male with history of HTN, DM,CKD 3b history of seizures and CVA with left-sided weakness, presenting after a passing out after choking on cereal falling on his face sustaining a LeFort type I/II fracture.     Syncope and collapse -Patient was eating cereal and apparently began to choke and was coughing before passing out falling forward onto his face on the floor losing consciousness. -Etiology of episode is unclear -cough syncope versus seizure versus other etiology -Head CT unremarkable -Syncope work-up to include cardiac monitoring, echocardiogram, EEG -No focal deficits to suggest CVA -Neurology consult    Seizure disorder [facility]) -Resume home meds pending med rec -Ativan as needed  seizure -Fall and seizure precautions -Neurology consult    Lefort ii fracture, initial encounter for closed fracture [facility]) -ER provider spoke with ENT Dr. Richardson Landry who recommended arranging follow-up/transfer to [facility] for follow-up -Consulted Dr. Richardson Landry via secure chart who recommends antibiotics and pain control, no consult necessary.  Consult canceled  -Keflex 3 times daily continued per conversation between ER provider and Dr. Richardson Landry -Soft diet then advance if tolerating -As needed pain meds    HTN (hypertension) -Continue home meds pending med rec    Type II diabetes mellitus with renal manifestations ([place]) -Sliding scale insulin coverage pending med rec    CKD (chronic kidney disease), stage IIIb -Renal function at baseline    History of CVA (cerebrovascular accident) with left hemiparesis -Continue home antiplatelets and statins pending med rec.    DVT prophylaxis: Lovenox  Code Status: full code  Family Communication:  none  Disposition Plan: Back to previous home environment Consults called: none  Status: Observation    Athena Masse MD [facility]     01/01/2020, 2:46 AM

## 2020-01-01 NOTE — ED Provider Notes (Signed)
[facility] Emergency Department Provider Note   ____________________________________________   First MD Initiated Contact with Patient 01/01/20 0132     (approximate)  I have reviewed the triage vital signs and the nursing notes.   HISTORY  Chief Complaint Loss of Consciousness and Fall    HPI Daniel [name] is a 64 y.o. male who presents to the ED from home with a chief complaint of syncope.  Patient has a history of diabetes, seizures, stroke with left-sided deficits he was eating cereal and began to choke.  He stood up and immediately blacked out, falling forward and striking his face.  States his nose was bleeding after the fall.  Denies anticoagulant use.  Complains of face and head pain.  Denies vision changes, neck pain, chest pain, shortness of breath, abdominal pain, nausea, vomiting or diarrhea.      Past Medical History:  Diagnosis Date  . Diabetes mellitus without complication ([place])   . Hypertension   . Pancreatitis   . Seizures ([place])   . Stroke ([place])   . Stroke [facility]) 2016    Patient Active Problem List   Diagnosis Date Noted  . Recurrent seizures ([name]) 07/13/2019  . Seizure ([place]) 07/12/2019  . HLD (hyperlipidemia) 07/12/2019  . Type II diabetes mellitus with renal manifestations ([place]) 07/12/2019  . CKD (chronic kidney disease), stage IIIb 07/12/2019  . AKI (acute kidney injury) ([place]) 06/13/2019  . Hyperkalemia 06/13/2019  . Obesity (BMI 30-39.9) 06/13/2019  . Depression 06/13/2019  . BPH (benign prostatic hyperplasia) 06/13/2019  . Peripheral neuropathy 06/13/2019  . DKA (diabetic ketoacidoses) ([place]) 06/12/2019  . Acute metabolic encephalopathy 99/37/1696  . Stroke ([place]) 10/27/2014  . Lymphadenitis 04/09/2011  . HTN (hypertension) 04/09/2011  . Uncontrolled diabetes mellitus with complications ([place]) 78/93/8101    Past Surgical History:  Procedure Laterality Date  . COLONOSCOPY WITH PROPOFOL N/A 10/07/2019   Procedure:  COLONOSCOPY WITH PROPOFOL;  Surgeon: Toledo, Benay Pike, MD;  Location: [facility] ENDOSCOPY;  Service: Gastroenterology;  Laterality: N/A;  . ESOPHAGOGASTRODUODENOSCOPY    . ESOPHAGOGASTRODUODENOSCOPY (EGD) WITH PROPOFOL N/A 10/07/2019   Procedure: ESOPHAGOGASTRODUODENOSCOPY (EGD) WITH PROPOFOL;  Surgeon: Toledo, Benay Pike, MD;  Location: [facility] ENDOSCOPY;  Service: Gastroenterology;  Laterality: N/A;  . TEE WITHOUT CARDIOVERSION N/A 11/01/2014   Procedure: TRANSESOPHAGEAL ECHOCARDIOGRAM (TEE);  Surgeon: Wellington Hampshire, MD;  Location: [facility] ORS;  Service: Cardiovascular;  Laterality: N/A;    Prior to Admission medications   Medication Sig Start Date End Date Taking? Authorizing Provider  atorvastatin (LIPITOR) 40 MG tablet Take 40 mg by mouth daily. 08/17/18   [provider]  blood glucose meter kit and supplies KIT Dispense based on patient and insurance preference. Use up to four times daily as directed. (FOR ICD-9 250.00, 250.01). 06/16/19   Annita Brod, MD  Docusate Sodium (DSS) 100 MG CAPS Take 100 mg by mouth in the morning and at bedtime. 08/04/19   [provider]  DULoxetine (CYMBALTA) 30 MG capsule Take 30 mg by mouth daily. Take with 60 mg 10/21/18   [provider]  DULoxetine (CYMBALTA) 60 MG capsule Take 60 mg by mouth daily. Takes with 30 mg 10/24/18   [provider]  gabapentin (NEURONTIN) 300 MG capsule Take 600 mg by mouth 3 (three) times daily.    [provider]  insulin glargine (LANTUS) 100 UNIT/ML injection Inject 0.2 mLs (20 Units total) into the skin at bedtime. Patient taking differently: Inject 25 Units into the skin at bedtime.  06/16/19   Annita Brod, MD  lamoTRIgine (LAMICTAL) 200 MG tablet Take 1 tablet (200 mg total) by mouth 2 (two) times daily. 07/13/19   Lavina Hamman, MD  metFORMIN (GLUCOPHAGE) 500 MG tablet Take 500 mg by mouth 2 (two) times daily. 06/22/19   [provider]  nortriptyline (PAMELOR) 50 MG  capsule Take 50 mg by mouth at bedtime.    [provider]  tamsulosin (FLOMAX) 0.4 MG CAPS capsule Take 1 capsule (0.4 mg total) by mouth daily. 07/24/17   Hollice Espy, MD    Allergies Patient has no known allergies.  Family History  Problem Relation Age of Onset  . CAD Sister   . Diabetes Sister   . Hypertension Sister   . Diabetes Sister   . Prostate cancer Father   . Diabetes Mother   . Bladder Cancer Neg Hx   . Kidney cancer Neg Hx     Social History Social History   Tobacco Use  . Smoking status: Former Smoker    Packs/day: 0.50    Years: 45.00    Pack years: 22.50    Types: Cigarettes    Quit date: 10/28/2014    Years since quitting: 5.1  . Smokeless tobacco: Never Used  Vaping Use  . Vaping Use: Never used  Substance Use Topics  . Alcohol use: Never  . Drug use: No    Review of Systems  Constitutional: No fever/chills Eyes: No visual changes. ENT: Positive for facial pain.  No sore throat. Cardiovascular: Denies chest pain. Respiratory: Denies shortness of breath. Gastrointestinal: No abdominal pain.  No nausea, no vomiting.  No diarrhea.  No constipation. Genitourinary: Negative for dysuria. Musculoskeletal: Negative for back pain. Skin: Negative for rash. Neurological: Positive for syncope.  Negative for headaches, focal weakness or numbness.   ____________________________________________   PHYSICAL EXAM:  VITAL SIGNS: ED Triage Vitals  Enc Vitals Group     BP 12/31/19 2219 (!) 156/94     Pulse Rate 12/31/19 2219 85     Resp 12/31/19 2219 20     Temp 12/31/19 2220 98.2 F (36.8 C)     Temp Source 12/31/19 2219 Oral     SpO2 12/31/19 2219 98 %     Weight 12/31/19 2220 205 lb (93 kg)     Height 12/31/19 2220 _0  (1.676 m)     Head Circumference --      Peak Flow --      Pain Score 12/31/19 2220 9     Pain Loc --      Pain Edu? --      Excl. in [place]? --     Constitutional: Alert and oriented.  Chronically ill appearing and  in no acute distress. Eyes: Conjunctivae are normal. PERRL. EOMI. Head: Atraumatic.  No significant swelling of left face. Nose: No nasal swelling.  Dried trickle of blood from left naris. Mouth/Throat: Mucous membranes are mildly dry.  No dental malocclusion.   Neck: No stridor.  No cervical spine tenderness to palpation. Cardiovascular: Normal rate, regular rhythm. Grossly normal heart sounds.  Good peripheral circulation. Respiratory: Normal respiratory effort.  No retractions. Lungs CTAB. Gastrointestinal: Soft and nontender to light or deep palpation. No distention. No abdominal bruits. No CVA tenderness. Musculoskeletal: No lower extremity tenderness nor edema.  No joint effusions. Neurologic: Alert and oriented X3.  Baseline left-sided deficits.  Normal speech and language. No gross focal neurologic deficits are appreciated.  Skin:  Skin is warm, dry and intact.  No rash noted. Psychiatric: Mood and affect are normal. Speech and behavior are normal.  ____________________________________________   LABS (all labs ordered are listed, but only abnormal results are displayed)  Labs Reviewed  BASIC METABOLIC PANEL - Abnormal; Notable for the following components:      Result Value   Glucose, Bld 196 (*)    Creatinine, Ser 1.39 (*)    Calcium 8.8 (*)    GFR calc non Af Amer 53 (*)    All other components within normal limits  CBC - Abnormal; Notable for the following components:   Hemoglobin 10.8 (*)    HCT 35.0 (*)    MCV 77.6 (*)    MCH 23.9 (*)    RDW 17.0 (*)    All other components within normal limits  SARS CORONAVIRUS 2 BY RT PCR (HOSPITAL ORDER, [place] [facility])  TROPONIN I (HIGH SENSITIVITY)   ____________________________________________  EKG  ED ECG REPORT I, Achille Xiang J, the attending physician, personally viewed and interpreted this ECG.   Date: 01/01/2020  EKG Time: 2225  Rate: 81  Rhythm: normal EKG, normal sinus rhythm  Axis:  Normal  Intervals:none  ST&T Change: Nonspecific  ____________________________________________  RADIOLOGY  ED MD interpretation: No ICH, LeFort II facial fractures  Official radiology report(s): CT HEAD WO CONTRAST  Result Date: 12/31/2019 CLINICAL DATA:  Facial trauma. Nasal abrasion and nose bleed. EXAM: CT HEAD WITHOUT CONTRAST TECHNIQUE: Contiguous axial images were obtained from the base of the skull through the vertex without intravenous contrast. COMPARISON:  Head CT 07/12/2019 FINDINGS: Brain: Stable appearance of moderate to large remote right MCA infarct. No intracranial hemorrhage, mass effect, or midline shift. Ex vacuo dilatation of the right lateral ventricle, unchanged. The basilar cisterns are patent. No evidence of territorial infarct or acute ischemia. No extra-axial or intracranial fluid collection. Vascular: No hyperdense vessel. Skull: No fracture or focal lesion. Sinuses/Orbits: Assessed on concurrent face CT, reported separately. The mastoid air cells are clear. Other: None. IMPRESSION: 1. No acute intracranial abnormality. No skull fracture. 2. Stable appearance of remote right MCA infarct. Electronically Signed   By: Keith Rake M.D.   On: 12/31/2019 23:07   CT Maxillofacial Wo Contrast  Result Date: 12/31/2019 CLINICAL DATA:  Facial trauma. EXAM: CT MAXILLOFACIAL WITHOUT CONTRAST TECHNIQUE: Multidetector CT imaging of the maxillofacial structures was performed. Multiplanar CT image reconstructions were also generated. COMPARISON:  Head CT 07/12/2019, no prior face CT available. FINDINGS: Osseous: Mildly displaced bilateral nasal bone fractures. No fracture of the nasal septum. There is a nondisplaced fracture involving the left pterygoid plate. Possible nondisplaced left zygomatic fracture. Mandibles are intact. Temporomandibular joints are congruent. Edentulous of upper teeth. Many absent lower teeth. Periapical lucency noted about in situ left molar. Orbits: Inferior  medial left orbital fracture appears remote, also seen on 12/24/2016 head CT. Right orbit is intact. No evidence of globe injury. Sinuses: Left maxillary sinus fracture involves the anterior, lateral, and posteromedial walls. Fracture abuts the alveolar ridge. There is air in the adjacent soft tissues and sinus fluid level/hemosinus. Acute fracture through the anterior and inferior/posterolateral walls of the right maxillary sinus with associated soft tissue air anteriorly and right maxillary hemosinus. The remaining paranasal sinuses are well aerated and clear. Soft tissues: Nasal soft tissue edema. Scattered soft tissue air adjacent to the sinus fracture site. Limited intracranial: Assessed on concurrent head CT, reported separately. IMPRESSION: 1. LeFort type facial fractures on the left with fractures of the maxillary sinus, anterior alveolar  ridge, pterygoid plate and left nasal bone, likely a LeFort 1/2. Possible nondisplaced left zygomatic arch fracture. 2. Right nasal bone fracture. Acute fracture through the anterior and posterolateral wall of the right maxillary sinus. No pterygoid plate involvement on the right. 3. Remote left orbital fracture. Electronically Signed   By: Keith Rake M.D.   On: 12/31/2019 23:16    ____________________________________________   PROCEDURES  Procedure(s) performed (including Critical Care):  .1-3 Lead EKG Interpretation Performed by: Paulette Blanch, MD Authorized by: Paulette Blanch, MD     Interpretation: normal     ECG rate:  80   ECG rate assessment: normal     Rhythm: sinus rhythm     Ectopy: none     Conduction: normal   Comments:     Patient placed on cardiac monitor to evaluate for arrhythmias     ____________________________________________   INITIAL IMPRESSION / ASSESSMENT AND PLAN / ED COURSE  As part of my medical decision making, I reviewed the following data within the [place] History obtained from family,  Nursing notes reviewed and incorporated, Labs reviewed, EKG interpreted, Radiograph reviewed, Discussed with admitting physician and Notes from prior ED visits     ANACLETO BATTERMAN was evaluated in Emergency Department on 01/01/2020 for the symptoms described in the history of present illness. He was evaluated in the context of the global COVID-19 pandemic, which necessitated consideration that the patient might be at risk for infection with the SARS-CoV-2 virus that causes COVID-19. Institutional protocols and algorithms that pertain to the evaluation of patients at risk for COVID-19 are in a state of rapid change based on information released by regulatory bodies including the CDC and federal and state organizations. These policies and algorithms were followed during the patient's care in the ED.    64 year old male with stroke, seizures, diabetes who presents with syncope.  Differential diagnosis includes but is not limited to [place], CVA, seizure, cardiac, infectious, metabolic etiologies, etc.  Laboratory results unremarkable.  Will check troponin.  CT maxillofacial demonstrates LeFort's type II fractures.  Will discuss with ENT and hospitalist services.   Clinical Course as of Dec 31 153  Fri Jan 01, 2020  0150 Discussed with ENT on-call Dr. Richardson Landry who agrees patient may be admitted to our facility for syncope, then referred for outpatient follow up of LeFort facial fractures. Will start prophylactic antibiotic and discuss with hospitalist services for admission.   [JS]    Clinical Course User Index [JS] Paulette Blanch, MD     ____________________________________________   FINAL CLINICAL IMPRESSION(S) / ED DIAGNOSES  Final diagnoses:  Syncope, unspecified syncope type  Closed Eddie Dibbles II fracture, initial encounter [facility])     ED Discharge Orders    None       Note:  This document was prepared using Dragon voice recognition software and may include unintentional dictation  errors.   Paulette Blanch, MD 01/01/20 [phone]

## 2020-01-01 NOTE — Progress Notes (Signed)
PT Screen Note  Patient Details Name: Daniel [name] MRN: [id number] DOB: December 04, 1955   Cancelled Treatment:    Reason Eval/Treat Not Completed: PT screened, no needs identified, will sign off Pt with baseline L sided weakness since CVA in 2014.  Here after syncopal episode but feeling back to baseline.  He ultimately was able to get up to sitting w/o assist, rose to standing w/o issue (or AD) and with only minimal use of SPC (which he does use at baseline) was able to easily, safely and confidently circumambulate the nurses station w/o issue. Pt reports feeling essentially back to baseline and agrees he does not require further PT follow up.  Will sign off.  Kreg Shropshire, DPT 01/01/2020, 5:51 PM

## 2020-01-01 NOTE — Progress Notes (Signed)
*  PRELIMINARY RESULTS* Echocardiogram 2D Echocardiogram has been performed.  Daniel [name] 01/01/2020, 10:16 AM

## 2020-01-01 NOTE — ED Notes (Signed)
Pt resp reg/unlabored. Reports discomfort along R side of body. Pt's speech clear. Visitor remains at bedside.

## 2020-01-01 NOTE — Progress Notes (Signed)
eeg done °

## 2020-01-01 NOTE — Procedures (Signed)
Patient Name: Daniel [name]  MRN: [id number]  Epilepsy Attending: Lora Havens  Referring Physician/Provider: Dr Judd Gaudier Date: 01/01/2020 Duration: 25.56 mins  Patient history: 64 y.o. male with PMH significant for  with PMH significant for prior R MCA stroke with seizures on Lamictal 200mg  BID, HTN who presents for an episode of passing out. EEG to evaluate for seizure  Level of alertness: Awake, asleep  AEDs during EEG study: Gabapentin, LTG  Technical aspects: This EEG study was done with scalp electrodes positioned according to the 10-20 International system of electrode placement. Electrical activity was acquired at a sampling rate of 500Hz  and reviewed with a high frequency filter of 70Hz  and a low frequency filter of 1Hz . EEG data were recorded continuously and digitally stored.   Description: The posterior dominant rhythm consists of 8.5 Hz activity of moderate voltage (25-35 uV) seen predominantly in posterior head regions, symmetric and reactive to eye opening and eye closing. Sleep was characterized by vertex waves, sleep spindles (12 to 14 Hz), maximal frontocentral region.  Physiologic photic driving was seen during photic stimulation.  Hyperventilation was not performed.     IMPRESSION: This study is within normal limits. No seizures or epileptiform discharges were seen throughout the recording.  Havard Radigan Barbra Sarks

## 2020-01-01 NOTE — Consult Note (Signed)
NEUROLOGY CONSULTATION NOTE   Date of service: January 01, 2020 Patient Name: Daniel [name] MRN:  [id number] DOB:  1955-10-04 Reason for consult: "seizure"  History of Present Illness  Daniel [name] is a 64 y.o. male with PMH significant for prior R MCA stroke with seizures on Lamictal 2106m BID, HTN who presents for an episode of passing out.  He was sitting at the edge of the bed eating cereal when he choked on it and it got stuck in the back of his throat. He was coughing very hard for 1-2 mins. It was a very big bout of cough. He was running short of breath. He passed out, fell forwards, head hit the ground first. He was unconsicous for a couple seconds. No post ictal somnolence.  As for his seizures, all of his seizures start off as twitching of eye and jumping of mouth. That is his warning sign that it is about to come. He has a loud cry during the seizure.  [facility] Noted to have a Daniel [name] fx on CT maxillofacial.    ROS   Constitutional Denies weight loss, fever and chills.  HEENT Denies changes in vision and hearing.  Respiratory Denies SOB and cough.  CV Denies palpitations and CP  GI Denies abdominal pain, nausea, vomiting and diarrhea.  GU Denies dysuria and urinary frequency.  MSK Denies myalgia and joint pain.  Skin Denies rash and pruritus.  Neurological Pain behind his ear, no numbness.  Psychiatric Denies recent changes in mood. Denies anxiety and depression.    Past History   Past Medical History:  Diagnosis Date  . [facility]   . Hypertension   . Pancreatitis   . Seizures ([facility]   . Stroke ([facility]   . Stroke ([facility] 2016   Past Surgical History:  Procedure Laterality Date  . COLONOSCOPY WITH PROPOFOL N/A 10/07/2019   Procedure: COLONOSCOPY WITH PROPOFOL;  Surgeon: Toledo, TBenay Pike MD;  Location: [facility] ENDOSCOPY;  Service: Gastroenterology;  Laterality: N/A;   . ESOPHAGOGASTRODUODENOSCOPY    . ESOPHAGOGASTRODUODENOSCOPY (EGD) WITH PROPOFOL N/A 10/07/2019   Procedure: ESOPHAGOGASTRODUODENOSCOPY (EGD) WITH PROPOFOL;  Surgeon: Toledo, TBenay Pike MD;  Location: [facility] ENDOSCOPY;  Service: Gastroenterology;  Laterality: N/A;  . TEE WITHOUT CARDIOVERSION N/A 11/01/2014   Procedure: TRANSESOPHAGEAL ECHOCARDIOGRAM (TEE);  Surgeon: MWellington Hampshire MD;  Location: [facility] ORS;  Service: Cardiovascular;  Laterality: N/A;   Family History  Problem Relation Age of Onset  . CAD Sister   . Diabetes Sister   . Hypertension Sister   . Diabetes Sister   . Prostate cancer Father   . Diabetes Mother   . Bladder Cancer Neg Hx   . Kidney cancer Neg Hx    Social History   Socioeconomic History  . Marital status: Married    Spouse name: Not on file  . Number of children: Not on file  . Years of education: Not on file  . Highest education level: Not on file  Occupational History  . Not on file  Tobacco Use  . Smoking status: Former Smoker    Packs/day: 0.50    Years: 45.00    Pack years: 22.50    Types: Cigarettes    Quit date: 10/28/2014    Years since quitting: 5.1  . Smokeless tobacco: Never Used  Vaping Use  . Vaping Use: Never used  Substance and Sexual Activity  . Alcohol use: Never  .  Drug use: No  . Sexual activity: Not on file  Other Topics Concern  . Not on file  Social History Narrative   ** Merged History Encounter **       Social Determinants of Health   Financial Resource Strain:   . Difficulty of Paying Living Expenses:   Food Insecurity:   . Worried About Charity fundraiser in the Last Year:   . Arboriculturist in the Last Year:   Transportation Needs:   . Film/video editor (Medical):   Marland Kitchen Lack of Transportation (Non-Medical):   Physical Activity:   . Days of Exercise per Week:   . Minutes of Exercise per Session:   Stress:   . Feeling of Stress :   Social Connections:   . Frequency of Communication with Friends and  Family:   . Frequency of Social Gatherings with Friends and Family:   . Attends Religious Services:   . Active Member of Clubs or Organizations:   . Attends Archivist Meetings:   Marland Kitchen Marital Status:    No Known Allergies  Medications   Medications Prior to Admission  Medication Sig Dispense Refill  . atorvastatin (LIPITOR) 40 MG tablet Take 40 mg by mouth daily.    . blood glucose meter kit and supplies KIT Dispense based on patient and insurance preference. Use up to four times daily as directed. (FOR ICD-9 250.00, 250.01). 1 each 0  . Docusate Sodium (DSS) 100 MG CAPS Take 100 mg by mouth in the morning and at bedtime.    . DULoxetine (CYMBALTA) 30 MG capsule Take 30 mg by mouth daily. Take with 60 mg    . DULoxetine (CYMBALTA) 60 MG capsule Take 60 mg by mouth daily. Takes with 30 mg    . gabapentin (NEURONTIN) 300 MG capsule Take 600 mg by mouth 3 (three) times daily.    . insulin glargine (LANTUS) 100 UNIT/ML injection Inject 0.2 mLs (20 Units total) into the skin at bedtime. (Patient taking differently: Inject 25 Units into the skin at bedtime. ) 10 mL 11  . lamoTRIgine (LAMICTAL) 200 MG tablet Take 1 tablet (200 mg total) by mouth 2 (two) times daily. 60 tablet 0  . metFORMIN (GLUCOPHAGE) 500 MG tablet Take 500 mg by mouth 2 (two) times daily.    . nortriptyline (PAMELOR) 50 MG capsule Take 50 mg by mouth at bedtime.    . tamsulosin (FLOMAX) 0.4 MG CAPS capsule Take 1 capsule (0.4 mg total) by mouth daily. 30 capsule 11     Vitals  Temp:  [98.2 F (36.8 C)] 98.2 F (36.8 C) (08/05 2220) Pulse Rate:  [75-95] 80 (08/06 0800) Resp:  [20] 20 (08/05 2219) BP: (144-158)/(94-106) 158/106 (08/06 0800) SpO2:  [95 %-98 %] 96 % (08/06 0800) Weight:  [93 kg] 93 kg (08/05 2220)  Body mass index is 33.09 kg/m.  Physical Exam   General: Laying comfortably in bed; in no acute distress.  HENT: Normal oropharynx and mucosa. Normal external appearance of ears and nose. Neck:  Supple, no pain or tenderness CV: No JVD. No peripheral edema. Pulmonary: Symmetric Chest rise. Normal respiratory effort. Abdomen: Soft to touch, non-tender Ext: No cyanosis, edema, or deformity  Skin: No rash. Normal palpation of skin.   Musculoskeletal: Normal digits and nails by inspection. No clubbing.  Neurologic Examination  Mental status/Cognition: Alert, oriented to self, place, month and year, good attention. Speech/language: Fluent, dysarthric speech, comprehension intact, object naming intact, repetition intact. Cranial  nerves:   CN II Pupils equal and reactive to light, no VF deficits   CN III,IV,VI EOM intact, no gaze preference or deviation, no nystagmus   CN V Decreased in left face.   CN VII Mild nasolabial fold flattening on the left.   CN VIII normal hearing to speech   CN IX & X Pain with opening his mouthc precluded assessment, tongue protrudes in midline.   CN XI    CN XII midline tongue protrusion   Motor:  Muscle bulk: normal, tone increased in LUE and LLE Mvmt Root Nerve  Muscle Right Left Comments  SA C5/6 Ax Deltoid 5 3   EF C5/6 Mc Biceps 5 3   EE C6/7/8 Rad Triceps 5 3   WF C6/7 Med FCR 5 4-   WE C7/8 PIN ECU 5 4-   F Ab C8/T1 U ADM/FDI 5 2   HF L1/2/3 Fem Illopsoas 5 5   KE L2/3/4 Fem Quad 5 5   DF L4/5 D Peron Tib Ant 5 5   PF S1/2 Tibial Grc/Sol 5 5    Reflexes:  Right Left Comments  Pectoralis      Biceps (C5/6)     Brachioradialis (C5/6) 2 2    Triceps (C6/7) 3 3    Patellar (L3/4) 3 3    Achilles (S1) 0 1    Hoffman      Plantar withdraws withdraws   Jaw jerk    Sensation:  Light touch Decreased in L face, LUE and LLE   Pin prick    Temperature    Vibration   Proprioception    Coordination/Complex Motor:  - Finger to Nose intact on R, unable to do on the left - Heel to shin intact on R, ataxic on left. - Rapid alternating movement slowed significantly on left. - Gait: not safe to assess due to seizure precautions.  Labs    Lab Results  Component Value Date   NA 141 12/31/2019   K 3.9 12/31/2019   CL 109 12/31/2019   CO2 24 12/31/2019   GLUCOSE 196 (H) 12/31/2019   BUN 16 12/31/2019   CREATININE 1.39 (H) 12/31/2019   CALCIUM 8.8 (L) 12/31/2019   ALBUMIN 3.5 07/14/2019   AST 22 07/14/2019   ALT 61 (H) 07/14/2019   ALKPHOS 52 07/14/2019   BILITOT 1.0 07/14/2019   GFRNONAA 53 (L) 12/31/2019   GFRAA >60 12/31/2019    Imaging and Diagnostic studies  CTH w/o contrast: 1. No acute intracranial abnormality. No skull fracture. 2. Stable appearance of remote right MCA infarct.  Impression   Daniel [name] is a 64 y.o. male with PMH significant for  with PMH significant for prior R MCA stroke with seizures on Lamictal 274m BID, HTN who presents for an episode of passing out. The description of the event seems more consistent with vasovagal syncope. He did not have his typical eye twitching and mouth jumping that is his warning sign for seizures.  Recommendations  - Continue home Lamotrigine at 2050mBID. - No changes to his Anti epileptic regimen at this time - Continue seizure precautions with seizure pads. - Neurology inpatient team will signoff. He should follow up with his outpatient neurologist.  ______________________________________________________________________   Thank you for the opportunity to take part in the care of this patient. If you have any further questions, please contact the neurology consultation attending.  Signed,  SaShipmanager Number [phone]

## 2020-01-01 NOTE — ED Notes (Signed)
Report given to kelly RN.

## 2020-01-02 DIAGNOSIS — N1831 Chronic kidney disease, stage 3a: Secondary | ICD-10-CM | POA: Diagnosis not present

## 2020-01-02 DIAGNOSIS — S02412S LeFort II fracture, sequela: Secondary | ICD-10-CM | POA: Diagnosis not present

## 2020-01-02 DIAGNOSIS — E785 Hyperlipidemia, unspecified: Secondary | ICD-10-CM | POA: Diagnosis not present

## 2020-01-02 DIAGNOSIS — E1169 Type 2 diabetes mellitus with other specified complication: Secondary | ICD-10-CM | POA: Diagnosis not present

## 2020-01-02 DIAGNOSIS — R55 Syncope and collapse: Secondary | ICD-10-CM | POA: Diagnosis present

## 2020-01-02 LAB — BASIC METABOLIC PANEL
Anion gap: 8 (ref 5–15)
BUN: 12 mg/dL (ref 8–23)
CO2: 25 mmol/L (ref 22–32)
Calcium: 8.6 mg/dL — ABNORMAL LOW (ref 8.9–10.3)
Chloride: 107 mmol/L (ref 98–111)
Creatinine, Ser: 1.47 mg/dL — ABNORMAL HIGH (ref 0.61–1.24)
GFR calc Af Amer: 58 mL/min — ABNORMAL LOW (ref >60)
GFR calc non Af Amer: 50 mL/min — ABNORMAL LOW (ref >60)
Glucose, Bld: 79 mg/dL (ref 70–99)
Potassium: 3.9 mmol/L (ref 3.5–5.1)
Sodium: 140 mmol/L (ref 135–145)

## 2020-01-02 LAB — GLUCOSE, CAPILLARY
Glucose-Capillary: 77 mg/dL (ref 70–99)
Glucose-Capillary: 91 mg/dL (ref 70–99)
Glucose-Capillary: 104 mg/dL — ABNORMAL HIGH (ref 70–99)

## 2020-01-02 LAB — CBC
HCT: 34.7 % — ABNORMAL LOW (ref 39.0–52.0)
Hemoglobin: 10.7 g/dL — ABNORMAL LOW (ref 13.0–17.0)
MCH: 23.6 pg — ABNORMAL LOW (ref 26.0–34.0)
MCHC: 30.8 g/dL (ref 30.0–36.0)
MCV: 76.6 fL — ABNORMAL LOW (ref 80.0–100.0)
Platelets: 165 10*3/uL (ref 150–400)
RBC: 4.53 MIL/uL (ref 4.22–5.81)
RDW: 17.3 % — ABNORMAL HIGH (ref 11.5–15.5)
WBC: 4.4 10*3/uL (ref 4.0–10.5)
nRBC: 0 % (ref 0.0–0.2)

## 2020-01-02 MED ORDER — OXYCODONE HCL 5 MG PO TABS: 5.0000 mg | ORAL_TABLET | Freq: Three times a day (TID) | ORAL | 0 refills | Status: AC | PRN

## 2020-01-02 MED ORDER — INSULIN GLARGINE 100 UNIT/ML ~~LOC~~ SOLN: 25.0000 [IU] | Freq: Every day | SUBCUTANEOUS | Status: AC

## 2020-01-02 MED ORDER — CEPHALEXIN 500 MG PO CAPS: 500.0000 mg | ORAL_CAPSULE | Freq: Three times a day (TID) | ORAL | 0 refills | Status: AC

## 2020-01-02 NOTE — Progress Notes (Signed)
Patient taken of the floor via wheelchair by staff. No distress noted. Personal cane with patient

## 2020-01-02 NOTE — Care Management CC44 (Signed)
Condition Code 44 Documentation Completed  Patient Details  Name: Daniel [name] MRN: [id number] Date of Birth: 1956-04-23   Condition Code 44 given:  Yes Patient signature on Condition Code 44 notice:  Yes Documentation of 2 MD's agreement:  Yes Code 44 added to claim:  Yes    Harriet Masson, RN 01/02/2020, 4:50 PM

## 2020-01-02 NOTE — TOC Transition Note (Signed)
Transition of Care [facility]) - CM/SW Discharge Note   Patient Details  Name: FABYAN LOUGHMILLER MRN: [id number] Date of Birth: [birth year]  Transition of Care [facility]) CM/SW Contact:  Harriet Masson, RN Phone [place] 01/02/2020, 4:52 PM   Clinical Narrative:     Pt will discharged today with no recommendation from PT however Alvis Lemmings was discussed for services and agreed to completed a safety evaluation in the home upon pot's discharge. No DME or other recommendation. RN spoke with pt and spouse at the bedside upon upon pt's discharge today. Pt has sufficient transportation and supportive family.        Patient Goals and CMS Choice        Discharge Placement                       Discharge Plan and Services                                     Social Determinants of Health (SDOH) Interventions     Readmission Risk Interventions No flowsheet data found.

## 2020-01-02 NOTE — Discharge Summary (Signed)
[name] [place] NAME: Daniel [name]    MR#:  [id number]  DATE OF BIRTH:  Oct 05, 1955  DATE OF ADMISSION:  01/01/2020 ADMITTING PHYSICIAN: Loletha Grayer, MD  DATE OF DISCHARGE: 01/02/2020  1:43 PM  PRIMARY CARE PHYSICIAN: Kirk Ruths, MD    ADMISSION DIAGNOSIS:  Syncope and collapse [R55] Closed Daniel [name] fracture, initial encounter ([place]) [S02.412A] Syncope, unspecified syncope type [R55] [place] [name] fracture, initial encounter for closed fracture ([place]) [S02.412A] Syncope [R55]  DISCHARGE DIAGNOSIS:  Principal Problem:   Syncope, vasovagal Active Problems:   HTN (hypertension)   Seizure disorder (HCC)   Type 2 diabetes mellitus with hyperlipidemia ([place])   CKD (chronic kidney disease), stage IIIb   Closed Daniel [name] fracture ([place])   History of CVA (cerebrovascular accident)   CKD (chronic kidney disease), stage [name]   Syncope   SECONDARY DIAGNOSIS:   Past Medical History:  Diagnosis Date  . Diabetes mellitus without complication ([place])   . Hypertension   . Pancreatitis   . Seizures ([place])   . Stroke ([place])   . Stroke [facility]) 2016    HOSPITAL COURSE:   1.  Vasovagal syncope secondary to coughing and choking.  Patient does not have a problem swallowing usually.  No arrhythmias overnight.  Echocardiogram normal ejection fraction.  Neurology did not think that this was a seizure and EEG negative. 2.  [place] [name] fracture subsequent evaluation.  Our ENT recommended following up with [facility].  Empiric Keflex.  Patient can go on soft diet for right now.  Advised the wife can put anything he wants in a blender.  As needed oxycodone 9 pills prescribed only. 3.  Seizure disorder continue lamotrigine 4.  Type 2 diabetes mellitus with hyperlipidemia on atorvastatin, glargine insulin and Metformin 5.  Chronic kidney disease stage IIIa 6.  History of ischemic stroke with left-sided weakness.  DISCHARGE CONDITIONS:    Satisfactory  CONSULTS OBTAINED:  Neurology  DRUG ALLERGIES:  No Known Allergies  DISCHARGE MEDICATIONS:   Allergies as of 01/02/2020   No Known Allergies     Medication List    TAKE these medications   atorvastatin 40 MG tablet Commonly known as: LIPITOR Take 40 mg by mouth daily.   blood glucose meter kit and supplies Kit Dispense based on patient and insurance preference. Use up to four times daily as directed. (FOR ICD-9 250.00, 250.01).   cephALEXin 500 MG capsule Commonly known as: KEFLEX Take 1 capsule (500 mg total) by mouth every 8 (eight) hours for 6 days.   DSS 100 MG Caps Take 100 mg by mouth in the morning and at bedtime.   DULoxetine 30 MG capsule Commonly known as: CYMBALTA Take 30 mg by mouth daily. Take with 60 mg   DULoxetine 60 MG capsule Commonly known as: CYMBALTA Take 60 mg by mouth daily. Takes with 30 mg   gabapentin 300 MG capsule Commonly known as: NEURONTIN Take 600 mg by mouth 3 (three) times daily.   insulin glargine 100 UNIT/ML injection Commonly known as: LANTUS Inject 0.25 mLs (25 Units total) into the skin at bedtime.   lamoTRIgine 200 MG tablet Commonly known as: LAMICTAL Take 1 tablet (200 mg total) by mouth 2 (two) times daily.   metFORMIN 500 MG tablet Commonly known as: GLUCOPHAGE Take 500 mg by mouth 2 (two) times daily.   nortriptyline 50 MG capsule Commonly known as: PAMELOR Take 50 mg by mouth at bedtime.  oxyCODONE 5 MG immediate release tablet Commonly known as: Roxicodone Take 1 tablet (5 mg total) by mouth every 8 (eight) hours as needed for up to 3 days.   tamsulosin 0.4 MG Caps capsule Commonly known as: Flomax Take 1 capsule (0.4 mg total) by mouth daily.        DISCHARGE INSTRUCTIONS:   Follow-up PMD 5 days Follow-up [facility] ear nose and throat doctor when able  If you experience worsening of your admission symptoms, develop shortness of breath, life threatening emergency, suicidal or  homicidal thoughts you must seek medical attention immediately by calling 911 or calling your MD immediately  if symptoms less severe.  You Must read complete instructions/literature along with all the possible adverse reactions/side effects for all the Medicines you take and that have been prescribed to you. Take any new Medicines after you have completely understood and accept all the possible adverse reactions/side effects.   Please note  You were cared for by a hospitalist during your hospital stay. If you have any questions about your discharge medications or the care you received while you were in the hospital after you are discharged, you can call the unit and asked to speak with the hospitalist on call if the hospitalist that took care of you is not available. Once you are discharged, your primary care physician will handle any further medical issues. Please note that NO REFILLS for any discharge medications will be authorized once you are discharged, as it is imperative that you return to your primary care physician (or establish a relationship with a primary care physician if you do not have one) for your aftercare needs so that they can reassess your need for medications and monitor your lab values.    Today   CHIEF COMPLAINT:   Chief Complaint  Patient presents with  . Loss of Consciousness  . Fall    HISTORY OF PRESENT ILLNESS:  Daniel [name]  is a 64 y.o. male came in after syncopal episode and landing face first on the floor.   VITAL SIGNS:  Blood pressure (!) 148/95, pulse 88, temperature 98.4 F (36.9 C), temperature source Oral, resp. rate 18, height _0  (1.676 m), weight 102.1 kg, SpO2 97 %.  I/O:    Intake/Output Summary (Last 24 hours) at 01/02/2020 1652 Last data filed at 01/02/2020 0539 Gross per 24 hour  Intake 320 ml  Output 280 ml  Net 40 ml    PHYSICAL EXAMINATION:  GENERAL:  64 y.o.-year-old patient lying in the bed with no acute distress.  EYES:  Pupils equal, round, reactive to light and accommodation. No scleral icterus. HEENT: Head atraumatic, normocephalic. Oropharynx and nasopharynx clear.   LUNGS: Normal breath sounds bilaterally, no wheezing, rales,rhonchi or crepitation. CARDIOVASCULAR: S1, S2 normal. No murmurs, rubs, or gallops.  ABDOMEN: Soft, non-tender, non-distended.  EXTREMITIES: No pedal edema.  NEUROLOGIC: Cranial nerves [name] through XII are intact.  Left arm contracted fingers and cannot bend at the elbow PSYCHIATRIC: The patient is alert and oriented x 3.  SKIN: Bruising on the face  DATA REVIEW:   CBC Recent Labs  Lab 01/02/20 0425  WBC 4.4  HGB 10.7*  HCT 34.7*  PLT 165    Chemistries  Recent Labs  Lab 01/02/20 0425  NA 140  K 3.9  CL 107  CO2 25  GLUCOSE 79  BUN 12  CREATININE 1.47*  CALCIUM 8.6*    Microbiology Results  Results for orders placed or performed during the hospital encounter of  01/01/20  SARS Coronavirus 2 by RT PCR (hospital order, performed in [facility] hospital lab) Nasopharyngeal Nasopharyngeal Swab     Status: None   Collection Time: 01/01/20  3:06 AM   Specimen: Nasopharyngeal Swab  Result Value Ref Range Status   SARS Coronavirus 2 NEGATIVE NEGATIVE Final    Comment: (NOTE) SARS-CoV-2 target nucleic acids are NOT DETECTED.  The SARS-CoV-2 RNA is generally detectable in upper and lower respiratory specimens during the acute phase of infection. The lowest concentration of SARS-CoV-2 viral copies this assay can detect is 250 copies / mL. A negative result does not preclude SARS-CoV-2 infection and should not be used as the sole basis for treatment or other patient management decisions.  A negative result may occur with improper specimen collection / handling, submission of specimen other than nasopharyngeal swab, presence of viral mutation(s) within the areas targeted by this assay, and inadequate number of viral copies (<250 copies / mL). A negative result must be  combined with clinical observations, patient history, and epidemiological information.  Fact Sheet for Patients:   StrictlyIdeas.no  Fact Sheet for Healthcare Providers: BankingDealers.co.za  This test is not yet approved or  cleared by the [place] FDA and has been authorized for detection and/or diagnosis of SARS-CoV-2 by FDA under an Emergency Use Authorization (EUA).  This EUA will remain in effect (meaning this test can be used) for the duration of the COVID-19 declaration under Section 564(b)(1) of the Act, 21 U.S.C. section 360bbb-3(b)(1), unless the authorization is terminated or revoked sooner.  Performed at [facility], [address] [place], [place] [address]     RADIOLOGY:  EEG  Result Date: 01/01/2020 Lora Havens, MD     01/01/2020  5:35 PM Patient Name: Daniel [name] MRN: [id number] Epilepsy Attending: Lora Havens Referring Physician/Provider: Dr Judd Gaudier Date: 01/01/2020 Duration: 25.56 mins Patient history: 64 y.o. male with PMH significant for  with PMH significant for prior R MCA stroke with seizures on Lamictal 231m BID, HTN who presents for an episode of passing out. EEG to evaluate for seizure Level of alertness: Awake, asleep AEDs during EEG study: Gabapentin, LTG Technical aspects: This EEG study was done with scalp electrodes positioned according to the 10-20 International system of electrode placement. Electrical activity was acquired at a sampling rate of _0  and reviewed with a high frequency filter of _1  and a low frequency filter of _2 . EEG data were recorded continuously and digitally stored. Description: The posterior dominant rhythm consists of 8.5 Hz activity of moderate voltage (25-35 uV) seen predominantly in posterior head regions, symmetric and reactive to eye opening and eye closing. Sleep was characterized by vertex waves, sleep spindles (12 to 14 Hz), maximal  frontocentral region.  Physiologic photic driving was seen during photic stimulation.  Hyperventilation was not performed.   IMPRESSION: This study is within normal limits. No seizures or epileptiform discharges were seen throughout the recording. PLora Havens  CT HEAD WO CONTRAST  Result Date: 12/31/2019 CLINICAL DATA:  Facial trauma. Nasal abrasion and nose bleed. EXAM: CT HEAD WITHOUT CONTRAST TECHNIQUE: Contiguous axial images were obtained from the base of the skull through the vertex without intravenous contrast. COMPARISON:  Head CT 07/12/2019 FINDINGS: Brain: Stable appearance of moderate to large remote right MCA infarct. No intracranial hemorrhage, mass effect, or midline shift. Ex vacuo dilatation of the right lateral ventricle, unchanged. The basilar cisterns are patent. No evidence of territorial infarct or acute ischemia. No extra-axial or  intracranial fluid collection. Vascular: No hyperdense vessel. Skull: No fracture or focal lesion. Sinuses/Orbits: Assessed on concurrent face CT, reported separately. The mastoid air cells are clear. Other: None. IMPRESSION: 1. No acute intracranial abnormality. No skull fracture. 2. Stable appearance of remote right MCA infarct. Electronically Signed   By: Keith Rake M.D.   On: 12/31/2019 23:07   ECHOCARDIOGRAM COMPLETE  Result Date: 01/01/2020    ECHOCARDIOGRAM REPORT   Patient Name:   Daniel [name] Date of Exam: 01/01/2020 Medical Rec #:  [id number]        Height:       66.0 in Accession #:    [id number]       Weight:       205.0 lb Date of Birth:  April 26, 1956         BSA:          2.021 m Patient Age:    63 years         BP:           148/95 mmHg Patient Gender: M                HR:           82 bpm. Exam Location:  [facility] Procedure: 2D Echo, Cardiac Doppler and Color Doppler Indications:     R55 Syncope  History:         Patient has prior history of Echocardiogram examinations.                  Stroke; Risk Factors:Hypertension and Diabetes.   Sonographer:     Charmayne Sheer RDCS (AE) Referring Phys:  [id number] Athena Masse Diagnosing Phys: Yolonda Kida MD  Sonographer Comments: No subcostal window and suboptimal parasternal window. Image acquisition challenging due to patient body habitus. IMPRESSIONS  1. Left ventricular ejection fraction, by estimation, is 60 to 65%. The left ventricle has normal function. The left ventricle has no regional wall motion abnormalities. Left ventricular diastolic parameters are consistent with Grade I diastolic dysfunction (impaired relaxation).  2. Right ventricular systolic function is normal. The right ventricular size is normal.  3. The mitral valve is normal in structure. Mild mitral valve regurgitation.  4. The aortic valve is normal in structure. Aortic valve regurgitation is not visualized. FINDINGS  Left Ventricle: Left ventricular ejection fraction, by estimation, is 60 to 65%. The left ventricle has normal function. The left ventricle has no regional wall motion abnormalities. The left ventricular internal cavity size was normal in size. There is  no left ventricular hypertrophy. Left ventricular diastolic parameters are consistent with Grade I diastolic dysfunction (impaired relaxation). Right Ventricle: The right ventricular size is normal. No increase in right ventricular wall thickness. Right ventricular systolic function is normal. Left Atrium: Left atrial size was normal in size. Right Atrium: Right atrial size was normal in size. Pericardium: There is no evidence of pericardial effusion. Mitral Valve: The mitral valve is normal in structure. Mild mitral valve regurgitation. MV peak gradient, 3.1 mmHg. The mean mitral valve gradient is 2.0 mmHg. Tricuspid Valve: The tricuspid valve is normal in structure. Tricuspid valve regurgitation is trivial. Aortic Valve: The aortic valve is normal in structure. Aortic valve regurgitation is not visualized. Aortic valve mean gradient measures 4.0 mmHg. Aortic valve  peak gradient measures 6.9 mmHg. Aortic valve area, by VTI measures 3.10 cm. Pulmonic Valve: The pulmonic valve was normal in structure. Pulmonic valve regurgitation is trivial. Aorta: The aortic root is normal in  size and structure. IAS/Shunts: No atrial level shunt detected by color flow Doppler.  LEFT VENTRICLE PLAX 2D LVIDd:         5.01 cm  Diastology LVIDs:         3.19 cm  LV e' lateral:   10.10 cm/s LV PW:         1.08 cm  LV E/e' lateral: 7.3 LV IVS:        0.83 cm  LV e' medial:    7.40 cm/s LVOT diam:     2.30 cm  LV E/e' medial:  10.0 LV SV:         74 LV SV Index:   37 LVOT Area:     4.15 cm  RIGHT VENTRICLE RV Basal diam:  3.42 cm LEFT ATRIUM             Index       RIGHT ATRIUM           Index LA diam:        3.30 cm 1.63 cm/m  RA Area:     14.60 cm LA Vol (A2C):   31.3 ml 15.49 ml/m RA Volume:   36.30 ml  17.96 ml/m LA Vol (A4C):   28.6 ml 14.15 ml/m LA Biplane Vol: 30.2 ml 14.94 ml/m  AORTIC VALVE                   PULMONIC VALVE AV Area (Vmax):    3.46 cm    PV Vmax:       0.79 m/s AV Area (Vmean):   3.60 cm    PV Vmean:      58.300 cm/s AV Area (VTI):     3.10 cm    PV VTI:        0.145 m AV Vmax:           131.00 cm/s PV Peak grad:  2.5 mmHg AV Vmean:          87.000 cm/s PV Mean grad:  2.0 mmHg AV VTI:            0.240 m AV Peak Grad:      6.9 mmHg AV Mean Grad:      4.0 mmHg LVOT Vmax:         109.00 cm/s LVOT Vmean:        75.400 cm/s LVOT VTI:          0.179 m LVOT/AV VTI ratio: 0.75  AORTA Ao Root diam: 3.00 cm MITRAL VALVE MV Area (PHT): 3.72 cm    SHUNTS MV Peak grad:  3.1 mmHg    Systemic VTI:  0.18 m MV Mean grad:  2.0 mmHg    Systemic Diam: 2.30 cm MV Vmax:       0.88 m/s MV Vmean:      59.5 cm/s MV Decel Time: 204 msec MV E velocity: 73.70 cm/s MV A velocity: 99.40 cm/s MV E/A ratio:  0.74 Dwayne D Callwood MD Electronically signed by Yolonda Kida MD Signature Date/Time: 01/01/2020/12:55:46 PM    Final    CT Maxillofacial Wo Contrast  Result Date: 12/31/2019 CLINICAL  DATA:  Facial trauma. EXAM: CT MAXILLOFACIAL WITHOUT CONTRAST TECHNIQUE: Multidetector CT imaging of the maxillofacial structures was performed. Multiplanar CT image reconstructions were also generated. COMPARISON:  Head CT 07/12/2019, no prior face CT available. FINDINGS: Osseous: Mildly displaced bilateral nasal bone fractures. No fracture of the nasal septum. There is a nondisplaced fracture involving the left pterygoid  plate. Possible nondisplaced left zygomatic fracture. Mandibles are intact. Temporomandibular joints are congruent. Edentulous of upper teeth. Many absent lower teeth. Periapical lucency noted about in situ left molar. Orbits: Inferior medial left orbital fracture appears remote, also seen on 12/24/2016 head CT. Right orbit is intact. No evidence of globe injury. Sinuses: Left maxillary sinus fracture involves the anterior, lateral, and posteromedial walls. Fracture abuts the alveolar ridge. There is air in the adjacent soft tissues and sinus fluid level/hemosinus. Acute fracture through the anterior and inferior/posterolateral walls of the right maxillary sinus with associated soft tissue air anteriorly and right maxillary hemosinus. The remaining paranasal sinuses are well aerated and clear. Soft tissues: Nasal soft tissue edema. Scattered soft tissue air adjacent to the sinus fracture site. Limited intracranial: Assessed on concurrent head CT, reported separately. IMPRESSION: 1. [place] type facial fractures on the left with fractures of the maxillary sinus, anterior alveolar ridge, pterygoid plate and left nasal bone, likely a [place] 1/2. Possible nondisplaced left zygomatic arch fracture. 2. Right nasal bone fracture. Acute fracture through the anterior and posterolateral wall of the right maxillary sinus. No pterygoid plate involvement on the right. 3. Remote left orbital fracture. Electronically Signed   By: Keith Rake M.D.   On: 12/31/2019 23:16    Management plans discussed with  the patient, family and they are in agreement.  CODE STATUS:     Code Status Orders  (From admission, onward)         Start     Ordered   01/01/20 0232  Full code  Continuous        01/01/20 0245        Code Status History    Date Active Date Inactive Code Status Order ID Comments User Context   07/12/2019 1747 07/14/2019 2122 Full Code [id number]  Ivor Costa, MD Inpatient   06/12/2019 1539 06/17/2019 0057 Full Code [id number]  Sidney Ace, MD ED   11/30/2018 1707 12/02/2018 1423 Full Code [id number]  Fritzi Mandes, MD Inpatient   10/27/2014 1201 11/02/2014 1319 Full Code [id number]  Max Sane, MD Inpatient   Advance Care Planning Activity      TOTAL TIME TAKING CARE OF THIS PATIENT: 35 minutes.    Loletha Grayer M.D on 01/02/2020 at 4:52 PM  Between 7am to 6pm - Pager - [phone]  After 6pm go to [email or web address] - password [email or web address]  [facility]  CC: Primary care physician; Kirk Ruths, MD

## 2020-01-02 NOTE — Progress Notes (Signed)
Made aware by lisa case manager that per PT note, patient does not need home health. Per Lattie Haw she can not order home health PT. Dr. Leslye Peer was made aware and per md patient is okay to discharge home without home health PT. Lattie Haw is following up with [facility]

## 2020-01-02 NOTE — Progress Notes (Signed)
Franz Dell to be D/C'd Home per MD order.  Discussed prescriptions and follow up appointments with the patient and wife. Prescriptions electronically submitted, medication list explained in detail. Pt and wife  verbalized understanding.  Allergies as of 01/02/2020   No Known Allergies     Medication List    TAKE these medications   atorvastatin 40 MG tablet Commonly known as: LIPITOR Take 40 mg by mouth daily.   blood glucose meter kit and supplies Kit Dispense based on patient and insurance preference. Use up to four times daily as directed. (FOR ICD-9 250.00, 250.01).   cephALEXin 500 MG capsule Commonly known as: KEFLEX Take 1 capsule (500 mg total) by mouth every 8 (eight) hours for 6 days.   DSS 100 MG Caps Take 100 mg by mouth in the morning and at bedtime.   DULoxetine 30 MG capsule Commonly known as: CYMBALTA Take 30 mg by mouth daily. Take with 60 mg   DULoxetine 60 MG capsule Commonly known as: CYMBALTA Take 60 mg by mouth daily. Takes with 30 mg   gabapentin 300 MG capsule Commonly known as: NEURONTIN Take 600 mg by mouth 3 (three) times daily.   insulin glargine 100 UNIT/ML injection Commonly known as: LANTUS Inject 0.25 mLs (25 Units total) into the skin at bedtime.   lamoTRIgine 200 MG tablet Commonly known as: LAMICTAL Take 1 tablet (200 mg total) by mouth 2 (two) times daily.   metFORMIN 500 MG tablet Commonly known as: GLUCOPHAGE Take 500 mg by mouth 2 (two) times daily.   nortriptyline 50 MG capsule Commonly known as: PAMELOR Take 50 mg by mouth at bedtime.   oxyCODONE 5 MG immediate release tablet Commonly known as: Roxicodone Take 1 tablet (5 mg total) by mouth every 8 (eight) hours as needed for up to 3 days.   tamsulosin 0.4 MG Caps capsule Commonly known as: Flomax Take 1 capsule (0.4 mg total) by mouth daily.       Vitals:   01/02/20 0803 01/02/20 1137  BP: (!) 124/93 (!) 148/95  Pulse: (!) 106 88  Resp: 20 18  Temp: 98.3  F (36.8 C) 98.4 F (36.9 C)  SpO2: 97% 97%    Skin clean, dry and intact without evidence of skin break down, no evidence of skin tears noted. IV catheter discontinued intact. Site without signs and symptoms of complications. Dressing and pressure applied. Pt denies pain at this time. No complaints noted.  An After Visit Summary was printed and given to the patient. Patient adamant on staying to eat lunch. Will be transported via wheelchair, wife at bedside.    [place]

## 2020-01-07 DIAGNOSIS — S0282XD Fracture of other specified skull and facial bones, left side, subsequent encounter for fracture with routine healing: Secondary | ICD-10-CM | POA: Diagnosis not present

## 2020-01-07 DIAGNOSIS — I10 Essential (primary) hypertension: Secondary | ICD-10-CM | POA: Diagnosis not present

## 2020-01-07 DIAGNOSIS — N1832 Chronic kidney disease, stage 3b: Secondary | ICD-10-CM | POA: Diagnosis not present

## 2020-01-07 DIAGNOSIS — S022XXD Fracture of nasal bones, subsequent encounter for fracture with routine healing: Secondary | ICD-10-CM | POA: Diagnosis not present

## 2020-01-07 DIAGNOSIS — I129 Hypertensive chronic kidney disease with stage 1 through stage 4 chronic kidney disease, or unspecified chronic kidney disease: Secondary | ICD-10-CM | POA: Diagnosis not present

## 2020-01-07 DIAGNOSIS — I69354 Hemiplegia and hemiparesis following cerebral infarction affecting left non-dominant side: Secondary | ICD-10-CM | POA: Diagnosis not present

## 2020-01-07 DIAGNOSIS — S02413D LeFort III fracture, subsequent encounter for fracture with routine healing: Secondary | ICD-10-CM | POA: Diagnosis not present

## 2020-01-07 DIAGNOSIS — S02412D LeFort II fracture, subsequent encounter for fracture with routine healing: Secondary | ICD-10-CM | POA: Diagnosis not present

## 2020-01-07 DIAGNOSIS — G40909 Epilepsy, unspecified, not intractable, without status epilepticus: Secondary | ICD-10-CM | POA: Diagnosis not present

## 2020-01-07 DIAGNOSIS — E1122 Type 2 diabetes mellitus with diabetic chronic kidney disease: Secondary | ICD-10-CM | POA: Diagnosis not present

## 2020-01-12 DIAGNOSIS — S022XXD Fracture of nasal bones, subsequent encounter for fracture with routine healing: Secondary | ICD-10-CM | POA: Diagnosis not present

## 2020-01-12 DIAGNOSIS — I69354 Hemiplegia and hemiparesis following cerebral infarction affecting left non-dominant side: Secondary | ICD-10-CM | POA: Diagnosis not present

## 2020-01-12 DIAGNOSIS — S0282XD Fracture of other specified skull and facial bones, left side, subsequent encounter for fracture with routine healing: Secondary | ICD-10-CM | POA: Diagnosis not present

## 2020-01-12 DIAGNOSIS — S02413D LeFort III fracture, subsequent encounter for fracture with routine healing: Secondary | ICD-10-CM | POA: Diagnosis not present

## 2020-01-12 DIAGNOSIS — G40909 Epilepsy, unspecified, not intractable, without status epilepticus: Secondary | ICD-10-CM | POA: Diagnosis not present

## 2020-01-12 DIAGNOSIS — N1832 Chronic kidney disease, stage 3b: Secondary | ICD-10-CM | POA: Diagnosis not present

## 2020-01-12 DIAGNOSIS — I129 Hypertensive chronic kidney disease with stage 1 through stage 4 chronic kidney disease, or unspecified chronic kidney disease: Secondary | ICD-10-CM | POA: Diagnosis not present

## 2020-01-12 DIAGNOSIS — S02412D LeFort II fracture, subsequent encounter for fracture with routine healing: Secondary | ICD-10-CM | POA: Diagnosis not present

## 2020-01-12 DIAGNOSIS — E1122 Type 2 diabetes mellitus with diabetic chronic kidney disease: Secondary | ICD-10-CM | POA: Diagnosis not present

## 2020-01-16 DIAGNOSIS — S022XXD Fracture of nasal bones, subsequent encounter for fracture with routine healing: Secondary | ICD-10-CM | POA: Diagnosis not present

## 2020-01-16 DIAGNOSIS — E1122 Type 2 diabetes mellitus with diabetic chronic kidney disease: Secondary | ICD-10-CM | POA: Diagnosis not present

## 2020-01-16 DIAGNOSIS — N1832 Chronic kidney disease, stage 3b: Secondary | ICD-10-CM | POA: Diagnosis not present

## 2020-01-16 DIAGNOSIS — G40909 Epilepsy, unspecified, not intractable, without status epilepticus: Secondary | ICD-10-CM | POA: Diagnosis not present

## 2020-01-16 DIAGNOSIS — I69354 Hemiplegia and hemiparesis following cerebral infarction affecting left non-dominant side: Secondary | ICD-10-CM | POA: Diagnosis not present

## 2020-01-16 DIAGNOSIS — S0282XD Fracture of other specified skull and facial bones, left side, subsequent encounter for fracture with routine healing: Secondary | ICD-10-CM | POA: Diagnosis not present

## 2020-01-16 DIAGNOSIS — S02413D LeFort III fracture, subsequent encounter for fracture with routine healing: Secondary | ICD-10-CM | POA: Diagnosis not present

## 2020-01-16 DIAGNOSIS — I129 Hypertensive chronic kidney disease with stage 1 through stage 4 chronic kidney disease, or unspecified chronic kidney disease: Secondary | ICD-10-CM | POA: Diagnosis not present

## 2020-01-16 DIAGNOSIS — S02412D LeFort II fracture, subsequent encounter for fracture with routine healing: Secondary | ICD-10-CM | POA: Diagnosis not present

## 2020-01-18 DIAGNOSIS — G40909 Epilepsy, unspecified, not intractable, without status epilepticus: Secondary | ICD-10-CM | POA: Diagnosis not present

## 2020-01-18 DIAGNOSIS — I129 Hypertensive chronic kidney disease with stage 1 through stage 4 chronic kidney disease, or unspecified chronic kidney disease: Secondary | ICD-10-CM | POA: Diagnosis not present

## 2020-01-18 DIAGNOSIS — E1122 Type 2 diabetes mellitus with diabetic chronic kidney disease: Secondary | ICD-10-CM | POA: Diagnosis not present

## 2020-01-18 DIAGNOSIS — S02413D LeFort III fracture, subsequent encounter for fracture with routine healing: Secondary | ICD-10-CM | POA: Diagnosis not present

## 2020-01-18 DIAGNOSIS — S0282XD Fracture of other specified skull and facial bones, left side, subsequent encounter for fracture with routine healing: Secondary | ICD-10-CM | POA: Diagnosis not present

## 2020-01-18 DIAGNOSIS — S02412D LeFort II fracture, subsequent encounter for fracture with routine healing: Secondary | ICD-10-CM | POA: Diagnosis not present

## 2020-01-18 DIAGNOSIS — S022XXD Fracture of nasal bones, subsequent encounter for fracture with routine healing: Secondary | ICD-10-CM | POA: Diagnosis not present

## 2020-01-20 DIAGNOSIS — G40909 Epilepsy, unspecified, not intractable, without status epilepticus: Secondary | ICD-10-CM | POA: Diagnosis not present

## 2020-01-20 DIAGNOSIS — S02413D LeFort III fracture, subsequent encounter for fracture with routine healing: Secondary | ICD-10-CM | POA: Diagnosis not present

## 2020-01-20 DIAGNOSIS — N1832 Chronic kidney disease, stage 3b: Secondary | ICD-10-CM | POA: Diagnosis not present

## 2020-01-20 DIAGNOSIS — I69354 Hemiplegia and hemiparesis following cerebral infarction affecting left non-dominant side: Secondary | ICD-10-CM | POA: Diagnosis not present

## 2020-01-20 DIAGNOSIS — S02412D LeFort II fracture, subsequent encounter for fracture with routine healing: Secondary | ICD-10-CM | POA: Diagnosis not present

## 2020-01-20 DIAGNOSIS — S022XXD Fracture of nasal bones, subsequent encounter for fracture with routine healing: Secondary | ICD-10-CM | POA: Diagnosis not present

## 2020-01-20 DIAGNOSIS — I129 Hypertensive chronic kidney disease with stage 1 through stage 4 chronic kidney disease, or unspecified chronic kidney disease: Secondary | ICD-10-CM | POA: Diagnosis not present

## 2020-01-20 DIAGNOSIS — S0282XD Fracture of other specified skull and facial bones, left side, subsequent encounter for fracture with routine healing: Secondary | ICD-10-CM | POA: Diagnosis not present

## 2020-01-20 DIAGNOSIS — E1122 Type 2 diabetes mellitus with diabetic chronic kidney disease: Secondary | ICD-10-CM | POA: Diagnosis not present

## 2020-01-22 DIAGNOSIS — Z0000 Encounter for general adult medical examination without abnormal findings: Secondary | ICD-10-CM | POA: Diagnosis not present

## 2020-01-22 DIAGNOSIS — Z23 Encounter for immunization: Secondary | ICD-10-CM | POA: Diagnosis not present

## 2020-01-22 DIAGNOSIS — N1832 Chronic kidney disease, stage 3b: Secondary | ICD-10-CM | POA: Diagnosis not present

## 2020-01-22 DIAGNOSIS — Z794 Long term (current) use of insulin: Secondary | ICD-10-CM | POA: Diagnosis not present

## 2020-01-22 DIAGNOSIS — E1122 Type 2 diabetes mellitus with diabetic chronic kidney disease: Secondary | ICD-10-CM | POA: Diagnosis not present

## 2020-01-22 DIAGNOSIS — I69354 Hemiplegia and hemiparesis following cerebral infarction affecting left non-dominant side: Secondary | ICD-10-CM | POA: Diagnosis not present

## 2020-01-22 DIAGNOSIS — S0282XD Fracture of other specified skull and facial bones, left side, subsequent encounter for fracture with routine healing: Secondary | ICD-10-CM | POA: Diagnosis not present

## 2020-01-22 DIAGNOSIS — R569 Unspecified convulsions: Secondary | ICD-10-CM | POA: Diagnosis not present

## 2020-01-22 DIAGNOSIS — S02413D LeFort III fracture, subsequent encounter for fracture with routine healing: Secondary | ICD-10-CM | POA: Diagnosis not present

## 2020-01-22 DIAGNOSIS — Z1211 Encounter for screening for malignant neoplasm of colon: Secondary | ICD-10-CM | POA: Diagnosis not present

## 2020-01-22 DIAGNOSIS — F0151 Vascular dementia with behavioral disturbance: Secondary | ICD-10-CM | POA: Diagnosis not present

## 2020-01-22 DIAGNOSIS — S022XXD Fracture of nasal bones, subsequent encounter for fracture with routine healing: Secondary | ICD-10-CM | POA: Diagnosis not present

## 2020-01-22 DIAGNOSIS — I129 Hypertensive chronic kidney disease with stage 1 through stage 4 chronic kidney disease, or unspecified chronic kidney disease: Secondary | ICD-10-CM | POA: Diagnosis not present

## 2020-01-22 DIAGNOSIS — S02412D LeFort II fracture, subsequent encounter for fracture with routine healing: Secondary | ICD-10-CM | POA: Diagnosis not present

## 2020-01-22 DIAGNOSIS — G40909 Epilepsy, unspecified, not intractable, without status epilepticus: Secondary | ICD-10-CM | POA: Diagnosis not present

## 2020-01-26 DIAGNOSIS — S022XXD Fracture of nasal bones, subsequent encounter for fracture with routine healing: Secondary | ICD-10-CM | POA: Diagnosis not present

## 2020-01-26 DIAGNOSIS — I129 Hypertensive chronic kidney disease with stage 1 through stage 4 chronic kidney disease, or unspecified chronic kidney disease: Secondary | ICD-10-CM | POA: Diagnosis not present

## 2020-01-26 DIAGNOSIS — I69354 Hemiplegia and hemiparesis following cerebral infarction affecting left non-dominant side: Secondary | ICD-10-CM | POA: Diagnosis not present

## 2020-01-26 DIAGNOSIS — G40909 Epilepsy, unspecified, not intractable, without status epilepticus: Secondary | ICD-10-CM | POA: Diagnosis not present

## 2020-01-26 DIAGNOSIS — E1122 Type 2 diabetes mellitus with diabetic chronic kidney disease: Secondary | ICD-10-CM | POA: Diagnosis not present

## 2020-01-26 DIAGNOSIS — S02412D LeFort II fracture, subsequent encounter for fracture with routine healing: Secondary | ICD-10-CM | POA: Diagnosis not present

## 2020-01-26 DIAGNOSIS — N1832 Chronic kidney disease, stage 3b: Secondary | ICD-10-CM | POA: Diagnosis not present

## 2020-01-26 DIAGNOSIS — S0282XD Fracture of other specified skull and facial bones, left side, subsequent encounter for fracture with routine healing: Secondary | ICD-10-CM | POA: Diagnosis not present

## 2020-01-26 DIAGNOSIS — S02413D LeFort III fracture, subsequent encounter for fracture with routine healing: Secondary | ICD-10-CM | POA: Diagnosis not present

## 2020-02-02 DIAGNOSIS — S0282XD Fracture of other specified skull and facial bones, left side, subsequent encounter for fracture with routine healing: Secondary | ICD-10-CM | POA: Diagnosis not present

## 2020-02-02 DIAGNOSIS — I69354 Hemiplegia and hemiparesis following cerebral infarction affecting left non-dominant side: Secondary | ICD-10-CM | POA: Diagnosis not present

## 2020-02-02 DIAGNOSIS — S02413D LeFort III fracture, subsequent encounter for fracture with routine healing: Secondary | ICD-10-CM | POA: Diagnosis not present

## 2020-02-02 DIAGNOSIS — G40909 Epilepsy, unspecified, not intractable, without status epilepticus: Secondary | ICD-10-CM | POA: Diagnosis not present

## 2020-02-02 DIAGNOSIS — S02412D LeFort II fracture, subsequent encounter for fracture with routine healing: Secondary | ICD-10-CM | POA: Diagnosis not present

## 2020-02-02 DIAGNOSIS — I129 Hypertensive chronic kidney disease with stage 1 through stage 4 chronic kidney disease, or unspecified chronic kidney disease: Secondary | ICD-10-CM | POA: Diagnosis not present

## 2020-02-02 DIAGNOSIS — S022XXD Fracture of nasal bones, subsequent encounter for fracture with routine healing: Secondary | ICD-10-CM | POA: Diagnosis not present

## 2020-02-02 DIAGNOSIS — N1832 Chronic kidney disease, stage 3b: Secondary | ICD-10-CM | POA: Diagnosis not present

## 2020-02-02 DIAGNOSIS — E1122 Type 2 diabetes mellitus with diabetic chronic kidney disease: Secondary | ICD-10-CM | POA: Diagnosis not present

## 2020-02-06 DIAGNOSIS — G40909 Epilepsy, unspecified, not intractable, without status epilepticus: Secondary | ICD-10-CM | POA: Diagnosis not present

## 2020-03-17 DIAGNOSIS — R569 Unspecified convulsions: Secondary | ICD-10-CM | POA: Diagnosis not present

## 2020-03-17 DIAGNOSIS — Z7185 Encounter for immunization safety counseling: Secondary | ICD-10-CM | POA: Diagnosis not present

## 2020-03-17 DIAGNOSIS — Z8673 Personal history of transient ischemic attack (TIA), and cerebral infarction without residual deficits: Secondary | ICD-10-CM | POA: Diagnosis not present

## 2020-03-17 DIAGNOSIS — F0151 Vascular dementia with behavioral disturbance: Secondary | ICD-10-CM | POA: Diagnosis not present

## 2020-03-17 DIAGNOSIS — I69354 Hemiplegia and hemiparesis following cerebral infarction affecting left non-dominant side: Secondary | ICD-10-CM | POA: Diagnosis not present

## 2020-06-16 DIAGNOSIS — I129 Hypertensive chronic kidney disease with stage 1 through stage 4 chronic kidney disease, or unspecified chronic kidney disease: Secondary | ICD-10-CM | POA: Diagnosis not present

## 2020-06-16 DIAGNOSIS — F1020 Alcohol dependence, uncomplicated: Secondary | ICD-10-CM | POA: Diagnosis not present

## 2020-06-16 DIAGNOSIS — R569 Unspecified convulsions: Secondary | ICD-10-CM | POA: Diagnosis not present

## 2020-06-16 DIAGNOSIS — Z135 Encounter for screening for eye and ear disorders: Secondary | ICD-10-CM | POA: Diagnosis not present

## 2020-06-16 DIAGNOSIS — E1122 Type 2 diabetes mellitus with diabetic chronic kidney disease: Secondary | ICD-10-CM | POA: Diagnosis not present

## 2020-06-16 DIAGNOSIS — N1832 Chronic kidney disease, stage 3b: Secondary | ICD-10-CM | POA: Diagnosis not present

## 2020-06-16 DIAGNOSIS — F0151 Vascular dementia with behavioral disturbance: Secondary | ICD-10-CM | POA: Diagnosis not present

## 2020-06-16 DIAGNOSIS — Z794 Long term (current) use of insulin: Secondary | ICD-10-CM | POA: Diagnosis not present

## 2020-06-27 ENCOUNTER — Ambulatory Visit: Payer: Medicare HMO | Attending: Neurology

## 2020-06-27 DIAGNOSIS — I69354 Hemiplegia and hemiparesis following cerebral infarction affecting left non-dominant side: Secondary | ICD-10-CM | POA: Diagnosis not present

## 2020-06-27 DIAGNOSIS — R2689 Other abnormalities of gait and mobility: Secondary | ICD-10-CM | POA: Diagnosis not present

## 2020-06-27 NOTE — Therapy (Unsigned)
[place] MAIN [facility] SERVICES [address] [place], [place], [address] Phone: [phone]   Fax:  [phone]  Physical Therapy Evaluation- Wheelchair  Patient Details  Name: Daniel [name] MRN: [id number] Date of Birth: 01/06/1956 Referring Provider (PT): Dr. Manuella Ghazi   Encounter Date: 06/27/2020    Past Medical History:  Diagnosis Date  . Diabetes mellitus without complication ([place])   . Hypertension   . Pancreatitis   . Seizures ([place])   . Stroke ([place])   . Stroke [facility]) 2016    Past Surgical History:  Procedure Laterality Date  . COLONOSCOPY WITH PROPOFOL N/A 10/07/2019   Procedure: COLONOSCOPY WITH PROPOFOL;  Surgeon: Toledo, Benay Pike, MD;  Location: [facility] ENDOSCOPY;  Service: Gastroenterology;  Laterality: N/A;  . ESOPHAGOGASTRODUODENOSCOPY    . ESOPHAGOGASTRODUODENOSCOPY (EGD) WITH PROPOFOL N/A 10/07/2019   Procedure: ESOPHAGOGASTRODUODENOSCOPY (EGD) WITH PROPOFOL;  Surgeon: Toledo, Benay Pike, MD;  Location: [facility] ENDOSCOPY;  Service: Gastroenterology;  Laterality: N/A;  . TEE WITHOUT CARDIOVERSION N/A 11/01/2014   Procedure: TRANSESOPHAGEAL ECHOCARDIOGRAM (TEE);  Surgeon: Wellington Hampshire, MD;  Location: [facility] ORS;  Service: Cardiovascular;  Laterality: N/A;    There were no vitals filed for this visit.    Subjective Assessment - 06/29/20 1639    Subjective Patient presents today for wheelchair evaluation    Patient is accompained by: Family member    Pertinent History Patient is a pleasant 65 year old male referred by [employer], for a wheelchair evaluation. Patient experienced a CVA with left sided hemiparesis in 2016. An MRI confirmed Right MCA on 10/27/2014. Patient presents to clinic today stating he a non-functional Left UE, altered sensation on left side of body with difficulty with all mobility and dependent on [name] to assist with all ADLs. He states he does not have any type of wheeled mobility devices other than a front wheeled  walker and states he has a cane as well but unable to use due to poor balance and increased risk of falling. His past medical history includes history of seizure, memory loss following CVA in 2016, Depression, and Diabetes. Patient reports unable to use a walker due to poor overall Left UE functional use and unsteady with a cane in home with 3 reported falls in past 6 months.    Limitations Lifting;Standing;Walking;House hold activities    How long can you sit comfortably? no limitation    How long can you stand comfortably? Unsteady with all standing and mobility    How long can you walk comfortably? unsteady with all mobility- increased risk of falling.    Patient Stated Goals To obtain a power wheelchair for improved mobility and decreased risk of falling.    Currently in Pain? Yes    Pain Score 2     Pain Location Arm    Pain Orientation Left    Pain Descriptors / Indicators Burning    Pain Type Chronic pain    Pain Onset More than a month ago    Pain Frequency Intermittent              [facility] PT Assessment - 06/29/20 0001      Assessment   Medical Diagnosis CVA- left side hemiparesis    Referring Provider (PT) Dr. Manuella Ghazi    Onset Date/Surgical Date 10/27/14    Hand Dominance Right    Next MD Visit unknown      Precautions   Precautions Fall      Restrictions   Weight Bearing Restrictions No  Balance Screen   Has the patient fallen in the past 6 months Yes    How many times? 3    Has the patient had a decrease in activity level because of a fear of falling?  Yes    Is the patient reluctant to leave their home because of a fear of falling?  Yes      Daniel [name]    Living Arrangements Spouse/significant other    Available Help at Discharge Family    Type of [place] to enter    Entrance Stairs-Number of Steps 1    Daniel [name] One level    [place] - 2  wheels;Cane - single point;Shower seat          PATIENT INFORMATION: This Evaluation form will serve as the LMN for the following suppliers:  Supplier: [employer] Contact Person: Daniel [name] Phone: [phone]   Reason for Referral:  Patient/caregiver Goals: Patient was seen for face-to-face evaluation for new power wheelchair.  Also present was Daniel [name]     to discuss recommendations and wheelchair options.  Further paperwork was completed and sent to vendor.  Patient appears to qualify for power mobility device at this time per objective findings.   MEDICAL HISTORY: Diagnosis: Hemiplegia and hemiparesis following cerebral infarction affecting left non-dominant side [I69.354]  Primary Diagnosis Onset: 10/27/2014 '[]' Progressive Disease Relevant Past and Future Surgeries: Height:5'7" Weight: 231 Explain and recent changes or trends in weight: no change in past several months  Relevant History including falls:  Patient is a pleasant 65 year old male referred by [employer], for a wheelchair evaluation. Patient experienced a CVA with left sided hemiparesis in 2016. An MRI confirmed Right MCA on 10/27/2014. Patient presents to clinic today stating he a non-functional Left UE, altered sensation on left side of body with difficulty with all mobility and dependent on [name] to assist with all ADLs. He states he does not have any type of wheeled mobility devices other than a front wheeled walker and states he has a cane as well but unable to use due to poor balance and increased risk of falling. His past medical history includes history of seizure, memory loss following CVA in 2016, Depression, and Diabetes. Patient reports unable to use a walker due to poor overall Left UE functional use and unsteady with a cane in home with 3 reported falls in past 6 months.      HOME ENVIRONMENT: '[]' House  '[]' Condo/town home  '[x]' Apartment  '[]' Assisted Living    '[]' Lives Alone '[x]'  Lives with Others   -  [name]                                            Hours with caregiver:   '[x]' Home is accessible to patient            Stairs  '[x]' Yes '[]'  No     Ramp '[]' Yes '[]' No Comments:  1 step into home   COMMUNITY ADL: TRANSPORTATION: '[x]' Car    '[]' Van    '[]' Public Transportation    '[]' Adapted w/c Lift   '[]' Ambulance   '[]' Other:       '[]' Sits in wheelchair during transport  Employment/School:     Specific requirements pertaining to mobility  Other:                                  Disabled per patient report     FUNCTIONAL/SENSORY PROCESSING SKILLS:  Handedness:   '[x]' Right     '[]' Left    '[]' NA  Comments:                                 Functional Processing Skills for Wheeled Mobility '[x]' Processing Skills are adequate for safe wheelchair operation  Areas of concern than may interfere with safe operation of wheelchair Description of problem   '[]'  Attention to environment     '[]' Judgment     '[]'  Hearing  '[]'  Vision or visual processing    '[]' Motor Planning  '[]'  Fluctuations in Behavior                                                None observed or reported   VERBAL COMMUNICATION: '[x]' WFL receptive '[x]'  WFL expressive '[x]' Understandable  '[]' Difficult to understand  '[]' non-communicative '[]'  Uses an augmented communication device    CURRENT SEATING / MOBILITY: Current Mobility Base:   '[x]' None  '[]' Dependent  '[]' Manual  '[]' Scooter  '[]' Power   Type of Control:                       Manufacturer:                         Size:                         Age:                           Current Condition of Mobility Base:                                                                                                                     Current Wheelchair components:  Describe posture in present seating system:                                                                             SENSATION and SKIN ISSUES: Sensation '[]' Intact '[x]' Impaired '[]' Absent   Level of sensation:    Altered sensation with light touch along left side of body - complains of "burning/tingling" sensation                     Pressure Relief: Able to perform effective pressure relief :   '[]' Yes  '[x]'  No Method:                                                                              If not, Why?:                                                                     Patient unstable with transfers and safety compromised - at increased risk of falling.    Skin Issues/Skin Integrity Current Skin Issues   '[]' Yes '[x]' No  '[x]' Intact '[]'  Red area '[]'  Open Area  '[]' Scar Tissue '[x]' At risk from prolonged sitting  Where                              History of Skin Issues   '[]' Yes '[x]' No  Where                                         When                                               Hx of skin flap surgeries '[]' Yes '[x]' No  Where                  When                                                  Limited sitting tolerance '[]' Yes '[x]' No Hours spent sitting in daily:  Complaint of Pain:  Please describe:    Burning/Stinging pain entire left UE/LE. 0/10 at best and current, 8/10 Left side burning/stinging UE/LE pain at worst.                                                                                                    Swelling/Edema:      - None noted today. Patient reports some intermittent swelling in Left LE with walking.                                                                                                                                           ADL STATUS (in reference to wheelchair use):  Indep Assist Unable Indep with Equip Not assessed Comments  Dressing                  x                                              Spouse assist due to poor functional use of left UE      Eating                  x                                                                        Set up by spouse                                     Toileting                     x  Intermittent assistance from spouse                              Bathing                   x                                                                             Daily physical assist from spouse                                      Grooming/ Hygiene                x                                                  Daily physical assist from spouse                                                            Meal Prep                x                                           Spouse assist                                                               IADLS                    x                                                                    Spouse assist                          Bowel Management: '[x]' Continent  '[]' Incontinent  '[]' Accidents Comments:                                                  Bladder Management: '[]'   Continent  '[x]' Incontinent  '[x]' Accidents Comments:            Patient requires urine pad in bed at night.                                   WHEELCHAIR SKILLS: Manual w/c Propulsion: '[]' UE or LE strength and endurance sufficient to participate in ADLs using manual wheelchair:  -Increased tone with left UE  Arm :  '[]' left '[]' right  '[]' Both                                   Foot:   '[]' left '[]' right  '[]' Both  Distance:   Operate Scooter: '[]'  Strength, hand grip, balance and transfer appropriate for use '[]' Living environment is accessible for use of scooter  Operate Power w/c:  '[x]'  Std. Joystick   '[]'  Alternative Controls Indep '[x]'  Assist '[]'  Dependent/ Unable '[]'  N/A '[]'  '[]' Safe          '[]'  Functional      Distance:                Bed confined without wheelchair '[]'  Yes '[x]'  No, However patient presents at increased fall risk and increased risk  of injury if he does not utilize a chair at this time.   STRENGTH/RANGE OF MOTION:  Range of Motion Strength  Shoulder                       Non- functional left UE  - increased tone                    Right= WFL                                                              2-5 Left, 4/5 right  Elbow                                  Non- functional left UE  - increased tone                       Right= WFL                                                                                                     2-/5 left/ 4+/5 right   Wrist/Hand  Non- functional left UE  - increased tone                       Right= WFL                                                                         2-/5 left/ 4+/5 right    Hip            Limited left hip add/abd on left              Right= WFL                                                                                                  2-/5 left/4+/5 right  Knee            WFL= Bilateral                                                                                                                 3-/5 left and 3+/5 right  Ankle WFL= right Left = to neutral (DF)                                                             Grossly 3+/5 bilaterally          MOBILITY/BALANCE:  '[]'  Patient is totally dependent for mobility                                                                                               Balance Transfers Ambulation  Sitting Balance: Standing Balance: '[]'  Independent '[]'  Independent/Modified Independent  '[]'  WFL     '[]'  WFL '[]'  Supervision '[]'  Supervision  '[x]'  Uses UE for balance  '[]'  Supervision '[x]'  Min Assist '[]'  Ambulates  with Assist                           '[]'  Min Assist '[x]'  Min assist '[]'  Mod Assist '[]'  Ambulates with Device:  '[]'  RW   '[]'  StW   '[]'  Cane   '[x]'    Patient reports furniture/wall walking at home- very unsafe. States he has a walker but  unable to use due to non-functional Left UE. Patient amb short distance in clinic with HHA of PT using min A and gait belt for support- yet continued unsteadiness and dragging left LE at times and still at HIGH risk for fall.            '[]'  Mod Assist '[]'  Mod assist '[]'  Max assist   '[]'  Max Assist '[]'  Max assist '[]'  Dependent '[]'  Indep. Short Distance Only  '[]'  Unable '[]'  Unable '[]'  Lift / Sling Required Distance 32 feet x 2 trials.                            '[]'  Sliding board '[]'  Unable to Ambulate: (Explain:  Cardio Status:  '[]' Intact  '[x]'  Impaired   '[]'  NA                              Respiratory Status:  '[x]' Intact   '[]' Impaired   '[]' NA                                     Orthotics/Prosthetics:                                                                         Comments (Address manual vs power w/c vs scooter):           Patient is a 65 year old male with history of CVA with left non-dominant side hemiparesis in 2016. Patient presents with significant decline in mobility and only walking short distances in home without a device - unable to use cane effectively or grip a walker. Patient reports 3 falls in the last 6 months. He presents today with left sided weakness- non functional Left UE and weakness in left LE interfering with safe mobility. He demonstrated walking today in clinic- initially without a device as he would at home- dragging left LE with decreased weight bearing and several episodes of loss of balance. He then performed a 28mWalk test in 32 sec (indicating decreased gait speed with increased risk of falling) with HHA to prevent possible fall.  Patient would not be appropriate to use a manual w/c or scooter due to presenting with left non-functional UE including unable to raise arm or grip to steer a scooter or maneuver a  Manual wheelchair. Due to patient impairments mentioned above he would be most appropriate for a power wheelchair for safe functional mobility in his home and the community for  optimal independence and improved quality of life and decreased risk of injury or fall.  Anterior / Posterior Obliquity Rotation-Pelvis  PELVIS    '[x]' Neutral  '[]'  Posterior  '[]'  Anterior     '[]' WFL  '[]' Right Elevated  '[x]' Left Elevated   '[x]' WFL  '[]' Right Anterior '[]'   Left Anterior    '[]'  Fixed '[]'  Partly Flexible '[x]'  Flexible  '[]'  Other  '[]'  Fixed  '[]'  Partly Flexible  '[x]'  Flexible '[]'  Other  '[]'  Fixed  '[]'  Partly Flexible  '[x]'  Flexible '[]'  Other  TRUNK '[x]' WFL '[]' Thoracic Kyphosis '[]' Lumbar Lordosis   '[x]'  WFL '[]' Convex Right '[]' Convex Left   '[]' c-curve '[]' s-curve '[]' multiple  '[x]'  Neutral '[]'  Left-anterior '[]'  Right-anterior    '[]'  Fixed '[x]'  Flexible '[]'  Partly Flexible       Other  '[]'  Fixed '[x]'  Flexible '[]'  Partly Flexible '[]'  Other  '[]'  Fixed           '[x]'  Flexible '[]'  Partly Flexible '[]'  Other   Position Windswept   HIPS  '[]'  Neutral '[x]'  Abduct Left '[]'  ADduct '[x]'  Neutral '[]'  Right '[]'  Left   Left LE abducted with poor ability to sit straight.     '[]'  Fixed  '[x]'  Partly Flexible             '[]'  Dislocated '[]'  Flexible '[]'  Subluxed    '[]'  Fixed '[]'  Partly Flexible  '[x]'  Flexible '[]'  Other              Foot Positioning Knee Positioning   Knees and  Feet  '[x]'  WFL '[]' Left '[]' Right '[x]'  WFL '[]' Left '[]' Right   KNEES ROM concerns: ROM concerns:   & Dorsi-Flexed                    '[x]' Lt '[]' Rt     Limited DF on left                             FEET Plantar Flexed                  '[]' Lt '[]' Rt     Inversion                    '[]' Lt '[]' Rt     Eversion                    '[]' Lt '[]' Rt    HEAD '[x]'  Functional '[]'  Good Head Control   & '[]'  Flexed         '[]'  Extended '[x]'  Adequate Head Control   NECK '[]'  Rotated  Lt  '[]'  Lat Flexed Lt '[]'  Rotated  Rt '[]'  Lat Flexed Rt '[]'  Limited Head Control    '[]'  Cervical Hyperextension '[]'  Absent  Head Control    SHOULDERS ELBOWS WRIST& HAND         Left     Right    Left     Right  U/E '[]' Functional   Left            '[x]' Functional  Right                                 '[x]' Fisting             '[]' Fisting     '[]' elevated Left '[x]' depressed  Left '[]' elevated Right '[]' depressed  Right      '[]' protracted Left '[]' retracted Left '[]' protracted Right '[]' retracted Right '[]' subluxed  Left              '[]' subluxed  Right   ** Left UE - non-functional with observed flexor tone      Goals for Wheelchair Mobility  '[x]'  Independence with mobility in the home with motor related ADLs (MRADLs)  '[x]'  Independence with MRADLs in the community '[]'  Provide dependent mobility  '[]'  Provide recline     '[x]' Provide tilt   Goals for Seating system '[x]'  Optimize pressure distribution '[x]'  Provide support needed to facilitate function or safety '[x]'  Provide corrective forces to assist with maintaining or improving posture '[]'  Accommodate client's posture: current seated postures and positions are not flexible or will not tolerate corrective forces '[x]'  Client to be independent with relieving pressure in the wheelchair '[]' Enhance physiological function such as breathing, swallowing, digestion  Simulation ideas/Equipment trials:    Quantum Edge 3                                                                                            State why other equipment was unsuccessful:   Patient is a 65 year old male with history of CVA with left non-dominant side hemiparesis in 2016. Patient presents with significant decline in mobility and only walking short distances in home without a device - unable to use cane effectively or grip a walker. Patient reports 3 falls in the last 6 months. He presents today with left sided weakness- non functional Left UE and weakness in left LE interfering with safe mobility. He demonstrated walking today in clinic- initially without a device as he would at home- dragging left LE with decreased weight bearing and several episodes of loss of balance. He then performed a 52mWalk test in 32 sec (indicating  decreased gait speed with increased risk of falling) with HHA to prevent possible fall.  Patient would not be appropriate to use a manual w/c or scooter due to presenting with left non-functional UE including unable to raise arm or grip to steer a scooter or maneuver a  Manual wheelchair. Due to patient impairments mentioned above he would be most appropriate for a power wheelchair for safe functional mobility in his home and the community for optimal independence and improved quality of life and decreased risk of injury or fall. He will benefit from use of tilt option for pressure relief due to prolonged sitting as well as assist with postural control. A tilt option will also decrease amount of unsafe transfers and decrease risk of falling. Patient will benefit from a back support for postural control as well. He will require a seat cushion for decreased risk of pressure ulcer due to altered sensation as well as bladder incontinence. A pelvic lateral thigh guide will be essential to assist the patient as he exhibits poor hip adduction and prevent his leg from abducting in seated position and assist with postural control. Patient will require a headrest for support in seated position as well as when tilted. Flip back armrests will be required for adequate elbow support and ability to safely transfer with decreased risk of falling. He will also need depth adjustable foot plate for appropriate foot support in sitting, promote adequate ankle ROM to decrease potential tone, and for safe  transfers to decrease risk of falling. In conclusion, Patient presents today with poor overall mobility with history of falls and demonstrated poor ambulation today during evaluation with increased fall risk. Patient is not able to safely use a walker or cane for safe mobility at this time nor a manual wheelchair or scooter due to non-functional left UE. He will require a Quantum Edge 3 power chair for optimal functional mobility,  improved ADL function, improved quality of life, and  decreased fall risk.                                                                                                      MOBILITY BASE RECOMMENDATIONS and JUSTIFICATION: MOBILITY COMPONENT JUSTIFICATION  Manufacturer:    Quantum       Model:  Edge 3           Size: 18      Seat Depth  20       '[x]' provide transport from point A to B '[x]' promote Indep mobility  '[x]' is not a safe, functional ambulator '[x]' walker or cane inadequate '[]' non-standard width/depth necessary to accommodate anatomical measurement '[]'                             '[]' Manual Mobility Base '[]' non-functional ambulator    '[]' Scooter/POV  '[]' can safely operate  '[]' can safely transfer   '[]' has adequate trunk stability  '[]' cannot functionally propel manual w/c  '[x]' Power Mobility Base  '[x]' not safe-ambulatory  '[x]' cannot functionally propel manual wheelchair  '[x]'  cannot functionally and safely operate scooter/POV '[x]' can safely operate and willing to  '[]' Stroller Base '[]' infant/child  '[]' unable to propel manual wheelchair '[]' allows for growth '[]' non-functional ambulator '[]' non-functional UE '[]' Indep mobility is not a goal at this time  '[x]' Tilt  '[]' Forward                   '[x]' Backward                  '[x]' Powered tilt              '[]' Manual tilt  '[x]' change position against gravitational force on head and shoulders  '[x]' change position for pressure relief/cannot weight shift '[x]' transfers  '[x]' management of tone '[x]' rest periods '[x]' control edema '[x]' facilitate postural control  '[]'                                       '[]' Recline  '[]' Power recline on power base '[]' Manual recline on manual base  '[]' accommodate femur to back angle  '[]' bring to full recline for ADL care  '[]' change position for pressure relief/cannot weight shift '[]' rest periods '[]' repositioning for transfers or clothing/diaper /catheter changes '[]' head positioning  '[]' Lighter weight required '[]' self- propulsion  '[]' lifting '[]'                                                  '[]'   Heavy Duty required '[]' user weight greater than 250# '[]' extreme tone/ over active movement '[]' broken frame on previous chair '[]'                                     '[x]'  Back  '[]'  Angle Adjustable '[]'  Custom molded                           '[x]' postural control '[x]' control of tone/spasticity '[x]' accommodation of range of motion '[x]' UE functional control '[]' accommodation for seating system '[]'                                          '[]' provide lateral trunk support '[]' accommodate deformity '[x]' provide posterior trunk support '[x]' provide lumbar/sacral support '[x]' support trunk in midline '[x]' Pressure relief over spinal processes  '[x]'  Seat Cushion                       '[x]' impaired sensation  '[]' decubitus ulcers present '[]' history of pressure ulceration '[x]' prevent pelvic extension '[]' low maintenance  '[x]' stabilize pelvis  '[x]' accommodate obliquity '[]' accommodate multiple deformity '[x]' neutralize lower extremity position '[x]' increase pressure distribution '[]'                                           '[x]'  Pelvic/thigh support  '[x]'  Lateral thigh guide '[]'  Distal medial pad  '[]'  Distal lateral pad '[]'  pelvis in neutral '[]' accommodate pelvis '[x]'  position upper legs '[x]'  alignment '[]'  accommodate ROM '[]'  decrease adduction '[x]' accommodate tone '[x]' removable for transfers '[x]' decrease abduction  '[]'  Lateral trunk Supports '[]'  Lt     '[]'  Rt '[]' decrease lateral trunk leaning '[]' control tone '[]' contour for increased contact '[]' safety  '[]' accommodate asymmetry '[]'                                                 '[]'  Mounting hardware  '[]' lateral trunk supports  '[x]' back   '[]' seat '[x]' headrest      '[x]'  thigh support '[]' fixed   '[]' swing away '[]' attach seat platform/cushion to w/c frame '[]' attach back cushion to w/c frame '[x]' mount postural supports '[x]' mount headrest  '[]' swing medial thigh support away '[x]' swing lateral supports away for transfers  '[]'                                                      Armrests  '[]' fixed '[x]' adjustable height '[]' removable   '[]' swing away  '[x]' flip back   '[]' reclining '[x]' full length pads '[]' desk    '[]' pads tubular  '[x]' provide support with elbow at 90   '[]' provide support for w/c tray '[x]' change of height/angles for variable activities '[x]' remove for transfers '[x]' allow to come closer to table top '[]' remove for access to tables '[]'   Hangers/ Leg rests  '[]' 60 '[]' 70 '[]' 90 '[]' elevating '[]' heavy duty  '[]' articulating '[]' fixed '[]' lift off '[]' swing away     '[]' power '[]' provide LE support  '[]' accommodate to hamstring tightness '[]' elevate legs during recline   '[]' provide change in position for Legs '[]' Maintain placement of feet on footplate '[]' durability '[]' enable transfers '[]' decrease edema '[]' Accommodate lower leg length '[]'                                         Foot support Footplate    '[]' Lt  '[]'  Rt  '[x]'  Center mount '[x]' flip up                            '[x]' depth/angle adjustable '[]' Amputee adapter    '[]'  Lt     '[]'  Rt '[x]' provide foot support '[x]' accommodate to ankle ROM '[x]' transfers '[]' Provide support for residual extremity '[]'  allow foot to go under wheelchair base '[]'  decrease tone  '[]'                                                 '[]'  Ankle strap/heel loops '[]' support foot on foot support '[]' decrease extraneous movement '[]' provide input to heel  '[]' protect foot  Tires: '[]' pneumatic  '[x]' flat free inserts  '[]' solid  '[x]' decrease maintenance  '[x]' prevent frequent flats '[]' increase shock absorbency '[]' decrease pain from road shock '[]' decrease spasms from road shock '[]'                                              '[x]'  Headrest  '[x]' provide posterior head support '[x]' provide posterior neck support '[]' provide lateral head support '[]' provide anterior head support '[x]' support during tilt and recline '[]' improve feeding   '[]' improve respiration '[]' placement of switches '[x]' safety  '[]' accommodate ROM  '[]' accommodate tone '[]' improve visual orientation  '[]'   Anterior chest strap '[]'  Vest '[]'  Shoulder retractors  '[]' decrease forward movement of shoulder '[]' accommodation of TLSO '[]' decrease forward movement of trunk '[]' decrease shoulder elevation '[]' added abdominal support '[]' alignment '[]' assistance with shoulder control  '[]'                                               Pelvic Positioner '[x]' Belt '[]' SubASIS bar '[]' Dual Pull '[]' stabilize tone '[x]' decrease falling out of chair/ **will not Decrease potential for sliding due to pelvic tilting '[]' prevent excessive rotation '[]' pad for protection over boney prominence '[]' prominence comfort '[]' special pull angle to control rotation '[]'                                                  Upper ExtremitySupport  '[]' L   '[]'  R '[]' Arm trough   '[]' hand support '[]'  tray       '[]' full tray '[]' swivel mount '[]' decrease edema      '[]' decrease subluxation   '[]' control tone   '[]' placement for AAC/Computer/EADL '[]' decrease gravitational pull on shoulders '[]' provide midline positioning '[]' provide support to increase UE function '[]' provide hand support in natural  position '[]' provide work surface   POWER WHEELCHAIR CONTROLS  '[x]' Proportional  '[]' Non-Proportional Type                                      '[]' Left  '[x]' Right '[x]' provides access for controlling wheelchair   '[]' lacks motor control to operate proportional drive control '[]' unable to understand proportional controls  Actuator Control Module  '[x]' Single  '[]' Multiple   '[x]' Allow the client to operate the power seat function(s) through the joystick control   '[]' Safety Reset Switches '[]' Used to change modes and stop the wheelchair when driving in latch mode    '[]' Upgraded Electronics   '[]' programming for accurate control '[]' progressive Disease/changing condition '[]' non-proportional drive control needed '[]' Needed in order to operate power seat functions through joystick control   '[]' Display box '[]' Allows user to see in which mode and drive the wheelchair is set  '[]' necessary for alternate controls     '[]' Digital interface electronics '[]' Allows w/c to operate when using alternative drive controls  '[]' ASL Head Array '[]' Allows client to operate wheelchair  through switches placed in tri-panel headrest  '[]' Sip and puff with tubing kit '[]' needed to operate sip and puff drive controls  '[]' Upgraded tracking electronics '[]' increase safety when driving '[]' correct tracking when on uneven surfaces  '[x]' Mount for switches or joystick '[]' Attaches switches to w/c  '[x]' Swing away for access or transfers '[]' midline for optimal placement '[]' provides for consistent access  '[]' Attendant controlled joystick plus mount '[]' safety '[]' long distance driving '[]' operation of seat functions '[]' compliance with transportation regulations '[]'                                             Rear wheel placement/Axle adjustability '[]' None '[]' semi adjustable '[]' fully adjustable  '[]' improved UE access to wheels '[]' improved stability '[]' changing angle in space for improvement of postural stability '[]' 1-arm drive access '[]' amputee pad placement '[]'                                Wheel rims/ hand rims  '[]' metal   '[]' plastic coated '[]' oblique projections           '[]' vertical projections '[]' Provide ability to propel manual wheelchair  '[]'  Increase self-propulsion with hand weakness/decreased grasp  Push handles '[]' extended   '[]' angle adjustable              '[]' standard '[]' caregiver access '[]' caregiver assist '[]' allows "hooking" to enable increased ability to perform ADLs or maintain balance  One armed device   '[]' Lt   '[]' Rt '[]' enable propulsion of manual wheelchair with one arm   '[]'                                            Brake/wheel lock extension '[]'  Lt   '[]'  Rt '[]' increase indep in applying wheel locks   '[]' Side guards '[]' prevent clothing getting caught in wheel or becoming soiled '[]'  prevent skin tears/abrasions  Battery:             [id number]                               '[x]' to power wheelchair  Other:                                                                                                                         The above equipment has a life- long use expectancy. Growth and changes in medical and/or functional conditions would be the exceptions. This is to certify that the therapist has no financial relationship with durable medical provider or manufacturer. The therapist will not receive remuneration of any kind for the equipment recommended in this evaluation.   Patient has mobility limitation that significantly impairs safe, timely participation in one or more mobility related ADL's. (bathing, toileting, feeding, dressing, grooming, moving from room to room)  '[x]'  Yes '[]'  No  Will mobility device sufficiently improve ability to participate and/or be aided in participation of MRADL's?      '[x]'  Yes '[]'  No  Can limitation be compensated for with use of a cane or walker?                                    '[]'  Yes '[x]'  No  Does patient or caregiver demonstrate ability/potential ability & willingness to safely use the mobility device?    '[x]'  Yes '[]'  No  Does patient's home environment support use of recommended mobility device?            '[x]'  Yes '[]'  No  Does patient have sufficient upper extremity function necessary to functionally propel a manual wheelchair?     '[]'  Yes '[x]'  No  Does patient have sufficient strength and trunk stability to safely operate a POV (scooter)?                                  '[]'  Yes '[x]'  No  Does patient need additional features/benefits provided by a power wheelchair for MRADL's in the home?        '[x]'  Yes '[]'  No  Does the patient demonstrate the ability to safely use a power wheelchair?                   '[x]'  Yes '[]'  No     Physician's Name Printed:                                                        Physician's Signature:  Date:     This is to certify that I, the above signed therapist have the following affiliations: '[]'  This DME provider '[]'  Manufacturer of  recommended equipment '[]'  Patient's long term care facility '[x]'  None of the above  Therapist Name/Signature:       Ollen Bowl, PT  Date: 06/27/2020               Objective measurements completed on examination: See above findings.                PT Short Term Goals - 06/28/20 7209      PT SHORT TERM GOAL #1   Title Pt and caregivers will understand PT recommendation and appropriate/safe use for wheelchair and seating for home use.    Baseline Patient currently with no seating mobility device and difficulty with all mobiity with increased risk of falling.    Time 1    Period Days    Status Achieved                     Plan - 06/29/20 1700    Clinical Impression Statement Patient is a 65 year old male with history of CVA with left non-dominant side hemiparesis in 2016. Patient presents with significant decline in mobility and only walking short distances in home without a device - unable to use cane effectively or grip a walker. Patient reports 3 falls in the last 6 months. He presents today with left sided weakness- non functional Left UE and weakness in left LE interfering with safe mobility. He demonstrated walking today in clinic- initially without a device as he would at home- dragging left LE with decreased weight bearing and several episodes of loss of balance. He then performed a 80mWalk test in 32 sec (indicating decreased gait speed with increased risk of falling) with HHA to prevent possible fall. Patient would not be appropriate to use a manual w/c or scooter due to presenting with left non-functional UE including unable to raise arm or grip to steer a scooter or maneuver a Manual wheelchair. Due to patient impairments mentioned above he would be most appropriate for a power wheelchair for safe functional mobility in his home and the community for optimal independence and improved quality of life and decreased  risk of injury or fall. In conclusion, Patient presents today with poor overall mobility with history of falls and demonstrated poor ambulation today during evaluation with increased fall risk. Patient is not able to safely use a walker or cane for safe mobility at this time nor a manual wheelchair or scooter due to non-functional left UE. He will require a Quantum Edge 3 power chair for optimal functional mobility, improved ADL function, improved quality of life, and decreased fall risk    Personal Factors and Comorbidities Time since onset of injury/illness/exacerbation;Comorbidity 3+    Comorbidities syncope, seizure, Diabetes    Examination-Activity Limitations Bathing;Bed Mobility;Bend;Carry;Continence;Hygiene/Grooming;Lift;Locomotion Level;Reach Overhead;Squat;Stairs;Stand;Toileting;Transfers    Examination-Participation Restrictions Cleaning;Community Activity;Driving;Yard Work;Meal Prep;Medication Management;Shop;Laundry    Stability/Clinical Decision Making Stable/Uncomplicated    PT Frequency One time visit    PT Duration --   PT wheelchair eval only   Consulted and Agree with Plan of Care Patient;Family member/caregiver    Family Member Consulted Daniel [name]           Patient will benefit from skilled therapeutic intervention in order to improve the following deficits and impairments:  Decreased mobility  Visit Diagnosis: Other abnormalities of gait and mobility  Hemiplegia and hemiparesis following cerebral infarction affecting left non-dominant side ([facility]     Problem List Patient Active Problem List   Diagnosis Date Noted  . Syncope 01/02/2020  . Syncope, vasovagal 01/01/2020  . Closed [place] fracture ([place] 01/01/2020  . History of CVA (cerebrovascular accident) 01/01/2020  .  CKD (chronic kidney disease), stage II   . Recurrent seizures ([place]) 07/13/2019  . Seizure disorder ([place]) 07/12/2019  . HLD (hyperlipidemia) 07/12/2019  . Type 2 diabetes mellitus with  hyperlipidemia ([place]) 07/12/2019  . CKD (chronic kidney disease), stage IIIb 07/12/2019  . AKI (acute kidney injury) ([place]) 06/13/2019  . Hyperkalemia 06/13/2019  . Obesity (BMI 30-39.9) 06/13/2019  . Depression 06/13/2019  . BPH (benign prostatic hyperplasia) 06/13/2019  . Peripheral neuropathy 06/13/2019  . DKA (diabetic ketoacidoses) 06/12/2019  . Acute metabolic encephalopathy 32/25/6720  . Stroke ([place]) 10/27/2014  . Lymphadenitis 04/09/2011  . HTN (hypertension) 04/09/2011  . Uncontrolled diabetes mellitus with complications ([place]) 91/98/0221    Daniel [name], PT 06/29/2020, 5:00 PM  [place] [facility] [address] [place], [place], [address] Phone: [phone]   Fax:  [phone]  Name: Daniel [name] MRN: [id number] Date of Birth: [birth year]

## 2020-06-30 DIAGNOSIS — F0151 Vascular dementia with behavioral disturbance: Secondary | ICD-10-CM | POA: Diagnosis not present

## 2020-06-30 DIAGNOSIS — E559 Vitamin D deficiency, unspecified: Secondary | ICD-10-CM | POA: Diagnosis not present

## 2020-06-30 DIAGNOSIS — R569 Unspecified convulsions: Secondary | ICD-10-CM | POA: Diagnosis not present

## 2020-06-30 DIAGNOSIS — Z8673 Personal history of transient ischemic attack (TIA), and cerebral infarction without residual deficits: Secondary | ICD-10-CM | POA: Diagnosis not present

## 2020-06-30 DIAGNOSIS — I69354 Hemiplegia and hemiparesis following cerebral infarction affecting left non-dominant side: Secondary | ICD-10-CM | POA: Diagnosis not present

## 2020-06-30 DIAGNOSIS — Z7189 Other specified counseling: Secondary | ICD-10-CM | POA: Diagnosis not present

## 2020-06-30 DIAGNOSIS — E538 Deficiency of other specified B group vitamins: Secondary | ICD-10-CM | POA: Diagnosis not present

## 2020-08-08 IMAGING — CT CT HEAD W/O CM
4 series · 16 of 47 positions shown, 18 images · non-contrast
Comparison: CT head 11/30/2018

CLINICAL DATA: Pt via EMS from home. Pt had three possible seizures
this am. Per EMS, pt was on the floor and post-ictal upon arrival.
Pt is currently [id number] upon arrival. Pt has a hx of seizures and
stroke that left him with L sided deficits.

EXAM:
CT HEAD WITHOUT CONTRAST
TECHNIQUE: Contiguous axial images were obtained from the base of the skull
through the vertex without intravenous contrast.

[Series 2: head wo · axial · 0.47mm/px · z∈[-113,-8]mm · 7 of 29 slices shown, 9 images]
[im 4/29  brain]
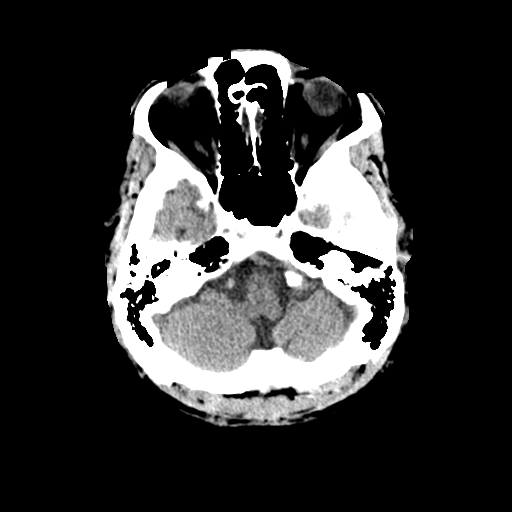
[im 4/29  bone]
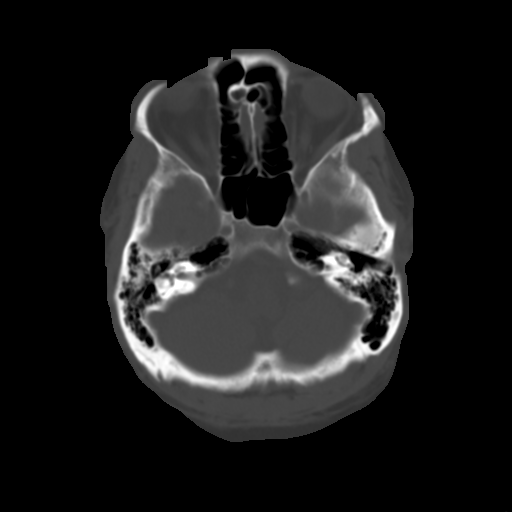
[im 8/29  brain]
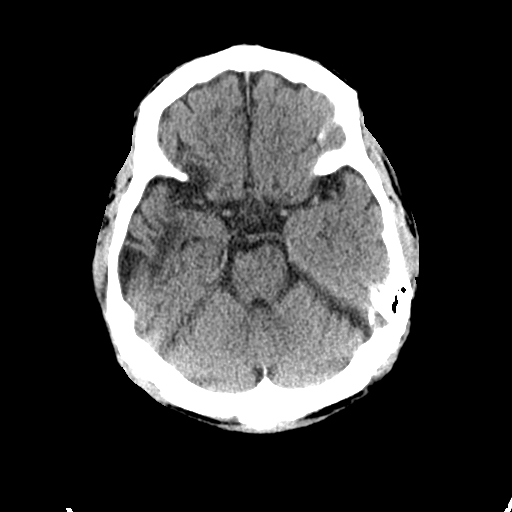
[im 11/29  brain]
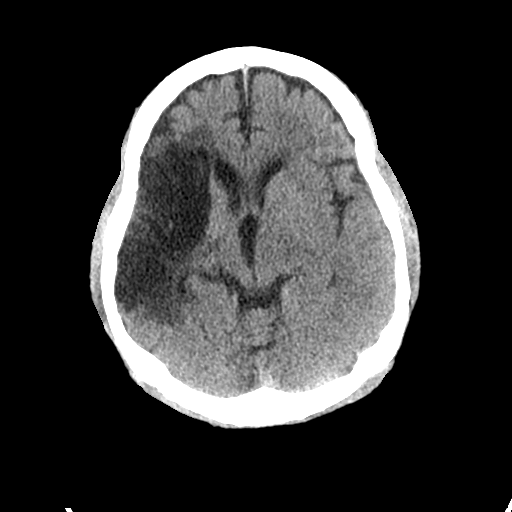
[im 15/29  brain]
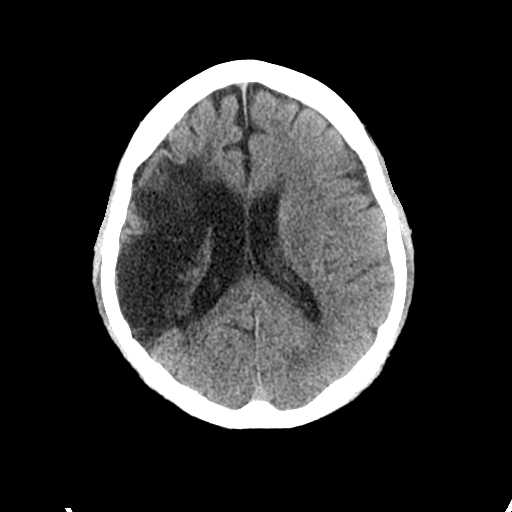
[im 18/29  brain]
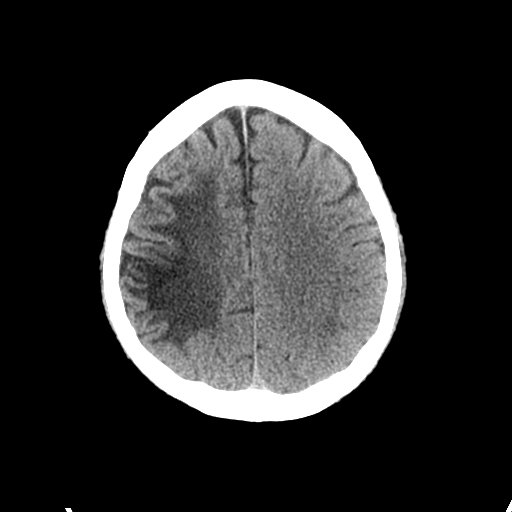
[im 18/29  bone]
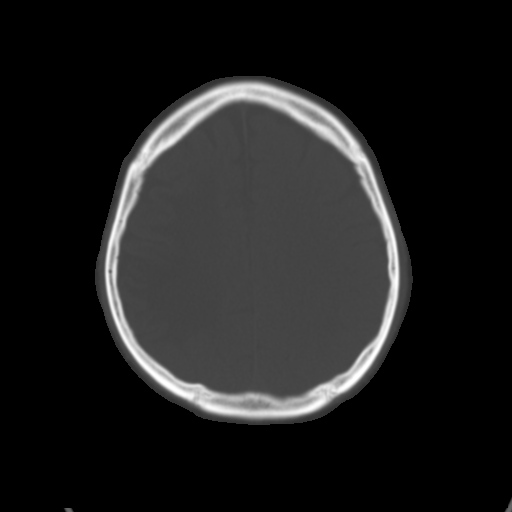
[im 22/29  brain]
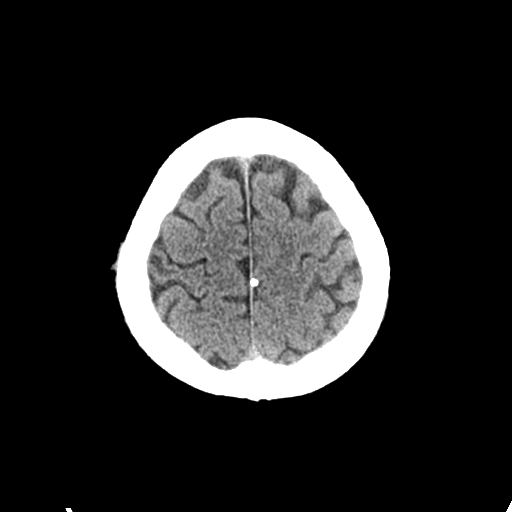
[im 25/29  brain]
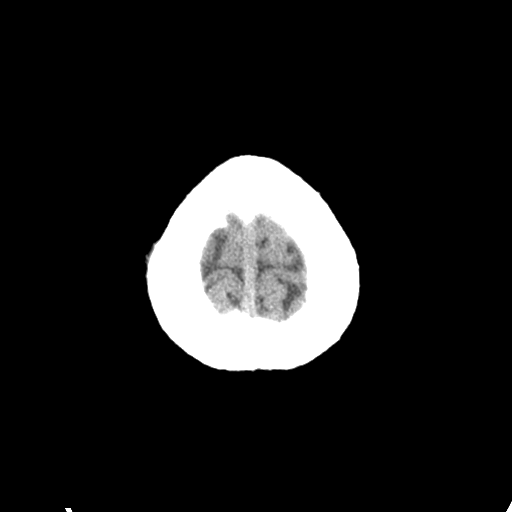

[Series 3: head bone · axial · 0.47mm/px · z∈[-114,-86]mm · 3 of 73 slices shown]
[im 8/73  bone]
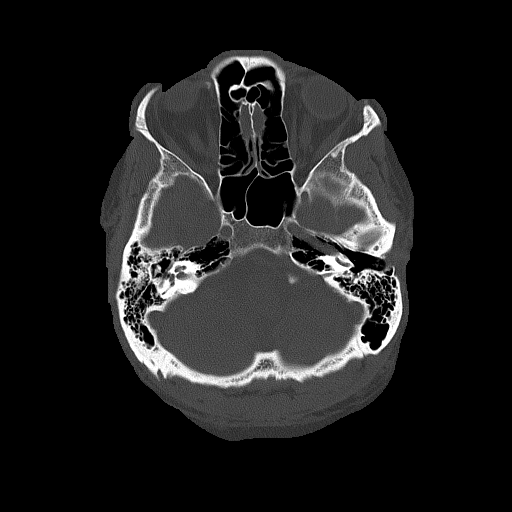
[im 15/73  bone]
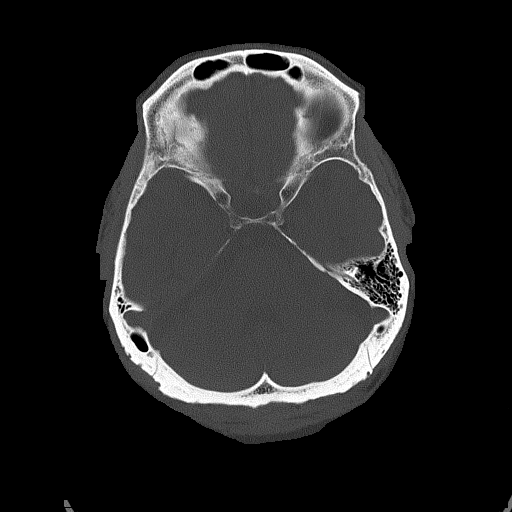
[im 22/73  bone]
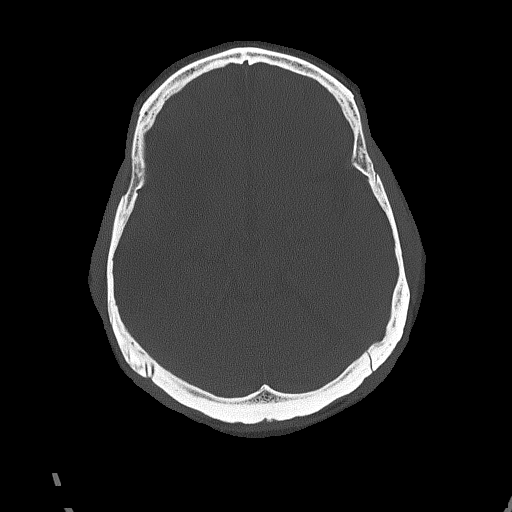

[Series 4: coronal soft tissue · coronal · 0.28mm/px · 3 of 67 slices shown]
[im 23/67  brain]
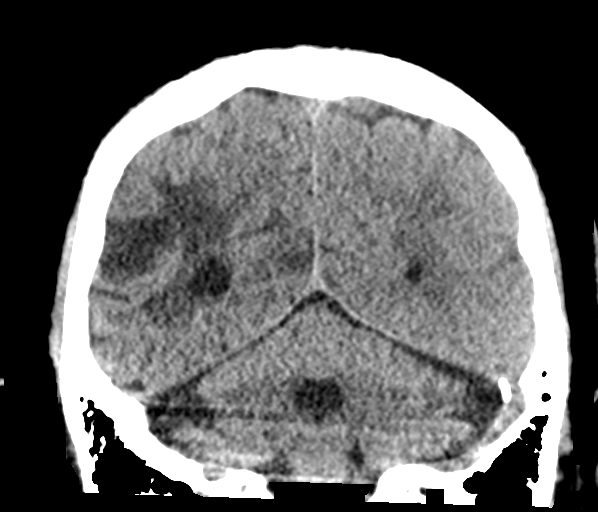
[im 30/67  brain]
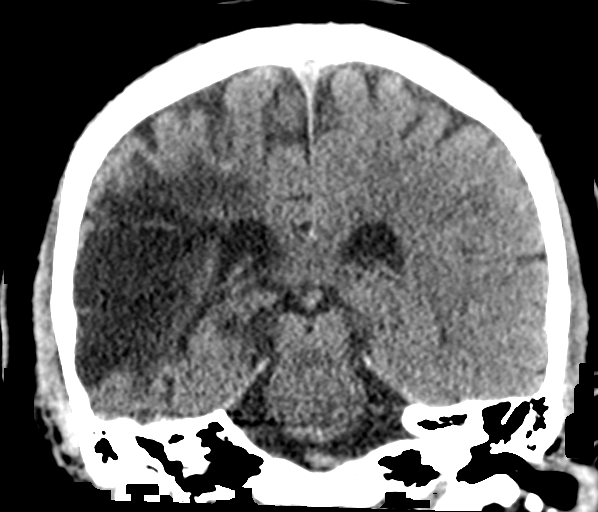
[im 37/67  brain]
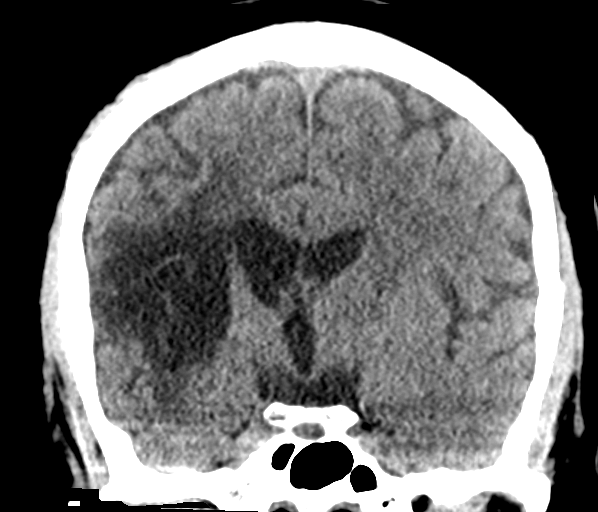

[Series 5: sagittal soft tissue · sagittal · 0.28mm/px · 3 of 56 slices shown]
[im 19/56  brain]
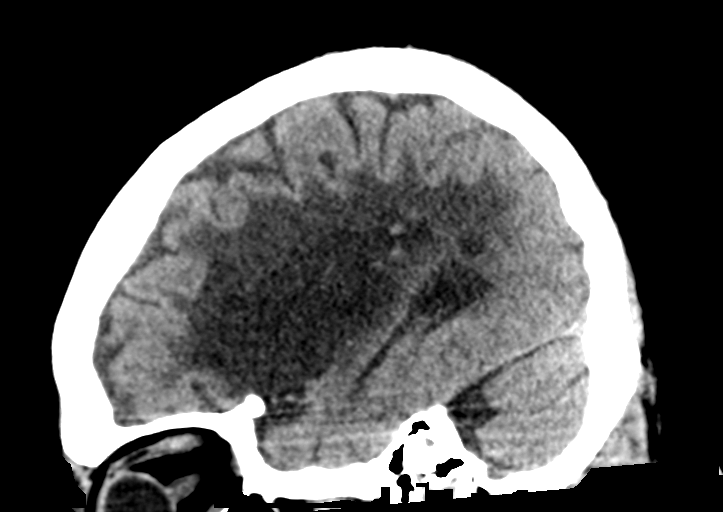
[im 28/56  brain]
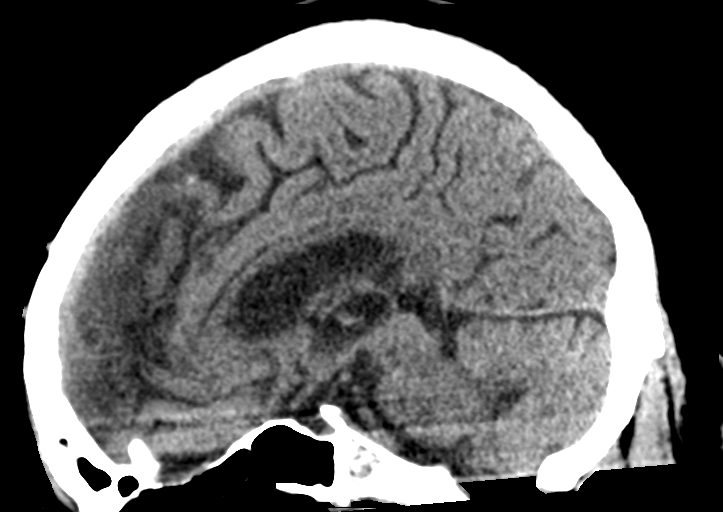
[im 37/56  brain]
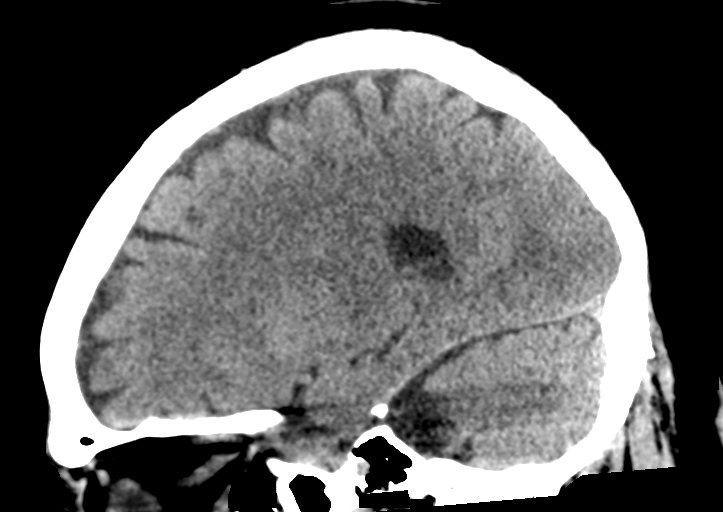

[16 of 47 positions shown; findings below may reference images not displayed]

FINDINGS: Brain: Remote moderate to large right MCA infarct. Periventricular
white matter hypodensity consistent with chronic small vessel
ischemic change. No evidence of acute infarction, hemorrhage,
hydrocephalus, extra-axial collection or mass lesion.

Vascular: No hyperdense vessel or unexpected calcification.

Skull: Normal. Negative for fracture or focal lesion.

Sinuses/Orbits: No acute finding.

Other: None.
IMPRESSION: 1. No acute intracranial abnormality.
2. Remote moderate to large right MCA infarct.

## 2020-08-20 ENCOUNTER — Emergency Department
Admission: EM | Admit: 2020-08-20 | Discharge: 2020-08-21 | Disposition: A | Discharge status: Home / Self Care | Payer: Medicare HMO | Attending: Emergency Medicine | Admitting: Emergency Medicine

## 2020-08-20 ENCOUNTER — Other Ambulatory Visit: Payer: Self-pay

## 2020-08-20 DIAGNOSIS — Z87891 Personal history of nicotine dependence: Secondary | ICD-10-CM | POA: Insufficient documentation

## 2020-08-20 DIAGNOSIS — Z7984 Long term (current) use of oral hypoglycemic drugs: Secondary | ICD-10-CM | POA: Diagnosis not present

## 2020-08-20 DIAGNOSIS — R569 Unspecified convulsions: Secondary | ICD-10-CM | POA: Diagnosis not present

## 2020-08-20 DIAGNOSIS — Z79899 Other long term (current) drug therapy: Secondary | ICD-10-CM | POA: Diagnosis not present

## 2020-08-20 DIAGNOSIS — Z794 Long term (current) use of insulin: Secondary | ICD-10-CM | POA: Diagnosis not present

## 2020-08-20 DIAGNOSIS — E1122 Type 2 diabetes mellitus with diabetic chronic kidney disease: Secondary | ICD-10-CM | POA: Insufficient documentation

## 2020-08-20 DIAGNOSIS — N1832 Chronic kidney disease, stage 3b: Secondary | ICD-10-CM | POA: Insufficient documentation

## 2020-08-20 DIAGNOSIS — I129 Hypertensive chronic kidney disease with stage 1 through stage 4 chronic kidney disease, or unspecified chronic kidney disease: Secondary | ICD-10-CM | POA: Diagnosis not present

## 2020-08-20 DIAGNOSIS — G40909 Epilepsy, unspecified, not intractable, without status epilepticus: Secondary | ICD-10-CM | POA: Diagnosis not present

## 2020-08-20 DIAGNOSIS — E1140 Type 2 diabetes mellitus with diabetic neuropathy, unspecified: Secondary | ICD-10-CM | POA: Insufficient documentation

## 2020-08-20 DIAGNOSIS — R0902 Hypoxemia: Secondary | ICD-10-CM | POA: Diagnosis not present

## 2020-08-20 LAB — CBC
HCT: 40 % (ref 39.0–52.0)
Hemoglobin: 12.4 g/dL — ABNORMAL LOW (ref 13.0–17.0)
MCH: 25.1 pg — ABNORMAL LOW (ref 26.0–34.0)
MCHC: 31 g/dL (ref 30.0–36.0)
MCV: 81 fL (ref 80.0–100.0)
Platelets: 190 10*3/uL (ref 150–400)
RBC: 4.94 MIL/uL (ref 4.22–5.81)
RDW: 15.3 % (ref 11.5–15.5)
WBC: 5.6 10*3/uL (ref 4.0–10.5)
nRBC: 0 % (ref 0.0–0.2)

## 2020-08-20 NOTE — ED Triage Notes (Signed)
Pt arrives via ems, per ems pt's spouse witnessed tonic clonic seizure, per ems pt without head injury. Pt is alert and oriented x4 without trauma noted and no loss of bowel or bladder. Pt denies pain.

## 2020-08-21 ENCOUNTER — Emergency Department: Payer: Medicare HMO

## 2020-08-21 DIAGNOSIS — R569 Unspecified convulsions: Secondary | ICD-10-CM | POA: Diagnosis not present

## 2020-08-21 LAB — BASIC METABOLIC PANEL
Anion gap: 9 (ref 5–15)
BUN: 12 mg/dL (ref 8–23)
CO2: 20 mmol/L — ABNORMAL LOW (ref 22–32)
Calcium: 9.3 mg/dL (ref 8.9–10.3)
Chloride: 109 mmol/L (ref 98–111)
Creatinine, Ser: 1.55 mg/dL — ABNORMAL HIGH (ref 0.61–1.24)
GFR, Estimated: 49 mL/min — ABNORMAL LOW (ref >60)
Glucose, Bld: 116 mg/dL — ABNORMAL HIGH (ref 70–99)
Potassium: 4.3 mmol/L (ref 3.5–5.1)
Sodium: 138 mmol/L (ref 135–145)

## 2020-08-21 LAB — ETHANOL: Alcohol, Ethyl (B): <10 mg/dL (ref <10)

## 2020-08-21 LAB — MAGNESIUM: Magnesium: 2.8 mg/dL — ABNORMAL HIGH (ref 1.7–2.4)

## 2020-08-21 NOTE — ED Notes (Signed)
Patient and wife requesting food and drink. Patient and wife educated on importance to remain NPO due to aspiration risk. Patient and wife state understanding.  Wife given crackers per request.

## 2020-08-21 NOTE — ED Notes (Signed)
Patient's wife given phone to call for ride prior to discharge.

## 2020-08-21 NOTE — ED Notes (Signed)
Patient assisted to sit in high fowlers. Given sandwich tray and water.

## 2020-08-21 NOTE — ED Provider Notes (Signed)
[facility] Emergency Department Provider Note  ____________________________________________   Event Date/Time   First MD Initiated Contact with Patient 08/20/20 2357     (approximate)  I have reviewed the triage vital signs and the nursing notes.   HISTORY  Chief Complaint Seizures    HPI Daniel [name] is a 65 y.o. male with medical history as listed below which notably includes a prior history of stroke as well as seizures.  He presents tonight by EMS for evaluation after seizure.   He says that he was just watching TV and then he could tell he is going to have a seizure.  He then felt his face started to twitch and then does not remember exactly what happened.  His wife witnessed him have some generalized shaking activity that lasted a couple minutes.  He has not injured himself recently has no history of head trauma.  He did not lose bowel or bladder control and did not bite his tongue or his cheeks.  He has back to his normal self but said that he feels quite tired.  He denies pain and shortness of breath.  He said he had a normal day today other than choking on some cereal earlier in the day.  No new medications or medication changes.  No use of drugs or alcohol.  He has no focal weakness or numbness in his extremities.  His wife says that he has been taking his lamotrigine as prescribed.  The onset was acute and the symptoms were severe and nothing in particular made them better or worse.        Past Medical History:  Diagnosis Date  . Diabetes mellitus without complication ([place])   . Hypertension   . Pancreatitis   . Seizures ([place])   . Stroke ([place])   . Stroke [facility]) 2016    Patient Active Problem List   Diagnosis Date Noted  . Syncope 01/02/2020  . Syncope, vasovagal 01/01/2020  . Closed [place] II fracture ([place]) 01/01/2020  . History of CVA (cerebrovascular accident) 01/01/2020  . CKD (chronic kidney disease), stage II   . Recurrent  seizures ([place]) 07/13/2019  . Seizure disorder ([place]) 07/12/2019  . HLD (hyperlipidemia) 07/12/2019  . Type 2 diabetes mellitus with hyperlipidemia ([place]) 07/12/2019  . CKD (chronic kidney disease), stage IIIb 07/12/2019  . AKI (acute kidney injury) ([place]) 06/13/2019  . Hyperkalemia 06/13/2019  . Obesity (BMI 30-39.9) 06/13/2019  . Depression 06/13/2019  . BPH (benign prostatic hyperplasia) 06/13/2019  . Peripheral neuropathy 06/13/2019  . DKA (diabetic ketoacidoses) 06/12/2019  . Acute metabolic encephalopathy 16/02/9603  . Stroke ([place]) 10/27/2014  . Lymphadenitis 04/09/2011  . HTN (hypertension) 04/09/2011  . Uncontrolled diabetes mellitus with complications ([place]) 54/01/8118    Past Surgical History:  Procedure Laterality Date  . COLONOSCOPY WITH PROPOFOL N/A 10/07/2019   Procedure: COLONOSCOPY WITH PROPOFOL;  Surgeon: Toledo, Benay Pike, MD;  Location: [facility] ENDOSCOPY;  Service: Gastroenterology;  Laterality: N/A;  . ESOPHAGOGASTRODUODENOSCOPY    . ESOPHAGOGASTRODUODENOSCOPY (EGD) WITH PROPOFOL N/A 10/07/2019   Procedure: ESOPHAGOGASTRODUODENOSCOPY (EGD) WITH PROPOFOL;  Surgeon: Toledo, Benay Pike, MD;  Location: [facility] ENDOSCOPY;  Service: Gastroenterology;  Laterality: N/A;  . TEE WITHOUT CARDIOVERSION N/A 11/01/2014   Procedure: TRANSESOPHAGEAL ECHOCARDIOGRAM (TEE);  Surgeon: Wellington Hampshire, MD;  Location: [facility] ORS;  Service: Cardiovascular;  Laterality: N/A;    Prior to Admission medications   Medication Sig Start Date End Date Taking? Authorizing Provider  atorvastatin (LIPITOR) 40 MG tablet Take 40  mg by mouth daily. 08/17/18   [provider]  blood glucose meter kit and supplies KIT Dispense based on patient and insurance preference. Use up to four times daily as directed. (FOR ICD-9 250.00, 250.01). 06/16/19   Annita Brod, MD  Docusate Sodium (DSS) 100 MG CAPS Take 100 mg by mouth in the morning and at bedtime. 08/04/19   [provider]  DULoxetine (CYMBALTA)  30 MG capsule Take 30 mg by mouth daily. Take with 60 mg 10/21/18   [provider]  DULoxetine (CYMBALTA) 60 MG capsule Take 60 mg by mouth daily. Takes with 30 mg 10/24/18   [provider]  gabapentin (NEURONTIN) 300 MG capsule Take 600 mg by mouth 3 (three) times daily.    [provider]  insulin glargine (LANTUS) 100 UNIT/ML injection Inject 0.25 mLs (25 Units total) into the skin at bedtime. 01/02/20   Loletha Grayer, MD  lamoTRIgine (LAMICTAL) 200 MG tablet Take 1 tablet (200 mg total) by mouth 2 (two) times daily. 07/13/19   Lavina Hamman, MD  metFORMIN (GLUCOPHAGE) 500 MG tablet Take 500 mg by mouth 2 (two) times daily. 06/22/19   [provider]  nortriptyline (PAMELOR) 50 MG capsule Take 50 mg by mouth at bedtime.    [provider]  tamsulosin (FLOMAX) 0.4 MG CAPS capsule Take 1 capsule (0.4 mg total) by mouth daily. 07/24/17   Hollice Espy, MD    Allergies Patient has no known allergies.  Family History  Problem Relation Age of Onset  . CAD Sister   . Diabetes Sister   . Hypertension Sister   . Diabetes Sister   . Prostate cancer Father   . Diabetes Mother   . Bladder Cancer Neg Hx   . Kidney cancer Neg Hx     Social History Social History   Tobacco Use  . Smoking status: Former Smoker    Packs/day: 0.50    Years: 45.00    Pack years: 22.50    Types: Cigarettes    Quit date: 10/28/2014    Years since quitting: 5.8  . Smokeless tobacco: Never Used  Vaping Use  . Vaping Use: Never used  Substance Use Topics  . Alcohol use: Never  . Drug use: No    Review of Systems Constitutional: No fever/chills Eyes: No visual changes. ENT: No sore throat. Cardiovascular: Denies chest pain. Respiratory: Denies shortness of breath. Gastrointestinal: No abdominal pain.  No nausea, no vomiting.  No diarrhea.  No constipation. Genitourinary: Negative for dysuria. Musculoskeletal: Negative for neck pain.  Negative for back  pain. Integumentary: Negative for rash. Neurological: Probable seizure.  Negative for headaches, focal weakness or numbness.   ____________________________________________   PHYSICAL EXAM:  VITAL SIGNS: ED Triage Vitals [08/20/20 2330]  Enc Vitals Group     BP (!) 141/98     Pulse Rate 95     Resp 16     Temp 97.9 F (36.6 C)     Temp Source Oral     SpO2 100 %     Weight 99.8 kg (220 lb)     Height 1.676 m (_0 )     Head Circumference      Peak Flow      Pain Score 0     Pain Loc      Pain Edu?      Excl. in [place]?     Constitutional: Alert and oriented.  Eyes: Conjunctivae are normal.  Head: Atraumatic. Nose: No congestion/rhinnorhea.  Mouth/Throat: Patient is wearing a mask. Neck: No stridor.  No meningeal signs.   Cardiovascular: Normal rate, regular rhythm. Good peripheral circulation. Respiratory: Normal respiratory effort.  No retractions. Gastrointestinal: Soft and nontender. No distention.  Musculoskeletal: No lower extremity tenderness nor edema. No gross deformities of extremities. Neurologic:  Normal speech and language. No gross focal neurologic deficits are appreciated.  Patient is moving all 4 extremities.  No subjective numbness.  No weakness in any of his extremities.  No aphasia, no dysarthria.  No cerebellar abnormalities. Skin:  Skin is warm, dry and intact. Psychiatric: Mood and affect are normal. Speech and behavior are normal.  ____________________________________________   LABS (all labs ordered are listed, but only abnormal results are displayed)  Labs Reviewed  CBC - Abnormal; Notable for the following components:      Result Value   Hemoglobin 12.4 (*)    MCH 25.1 (*)    All other components within normal limits  BASIC METABOLIC PANEL - Abnormal; Notable for the following components:   CO2 20 (*)    Glucose, Bld 116 (*)    Creatinine, Ser 1.55 (*)    GFR, Estimated 49 (*)    All other components within normal limits  MAGNESIUM -  Abnormal; Notable for the following components:   Magnesium 2.8 (*)    All other components within normal limits  ETHANOL  LAMOTRIGINE LEVEL   ____________________________________________  EKG  No indication for emergent EKG ____________________________________________  RADIOLOGY I, Hinda Kehr, personally viewed and evaluated these images (plain radiographs) as part of my medical decision making, as well as reviewing the written report by the radiologist.  ED MD interpretation: No acute intracranial abnormality with evidence of old right MCA infarction consistent with his medical history.  Official radiology report(s): CT HEAD WO CONTRAST  Result Date: 08/21/2020 CLINICAL DATA:  Seizure EXAM: CT HEAD WITHOUT CONTRAST TECHNIQUE: Contiguous axial images were obtained from the base of the skull through the vertex without intravenous contrast. COMPARISON:  12/31/2019 FINDINGS: Brain: There is no mass, hemorrhage or extra-axial collection. The size and configuration of the ventricles and extra-axial CSF spaces are normal. There is an old right MCA territory infarction. Vascular: No abnormal hyperdensity of the major intracranial arteries or dural venous sinuses. No intracranial atherosclerosis. Skull: The visualized skull base, calvarium and extracranial soft tissues are normal. Sinuses/Orbits: No fluid levels or advanced mucosal thickening of the visualized paranasal sinuses. No mastoid or middle ear effusion. The orbits are normal. IMPRESSION: 1. No acute intracranial abnormality. 2. Old right MCA territory infarction. Electronically Signed   By: Ulyses Jarred M.D.   On: 08/21/2020 00:48    ____________________________________________   PROCEDURES   Procedure(s) performed (including Critical Care):  .1-3 Lead EKG Interpretation Performed by: Hinda Kehr, MD Authorized by: Hinda Kehr, MD     Interpretation: normal     ECG rate:  90   ECG rate assessment: normal     Rhythm:  sinus rhythm     Ectopy: none     Conduction: normal       ____________________________________________   INITIAL IMPRESSION / MDM / ASSESSMENT AND PLAN / ED COURSE  As part of my medical decision making, I reviewed the following data within the [place] History obtained from family, Nursing notes reviewed and incorporated, Labs reviewed , Old chart reviewed and Notes from prior ED visits   Differential diagnosis includes, but is not limited to, breakthrough seizure, acute intracranial bleed, CVA, medication or drug side  effect, electrolyte abnormality.  The patient is on the cardiac monitor to evaluate for evidence of arrhythmia and/or significant heart rate changes.  Patient is well-appearing and in no distress.  His wife said that he has not had a seizure for a few months.  He has no focal neurological deficits and overall reassuring exam and assessment.  His history shows no warning signs.  I will check basic lab work and obtain a CT of his head given his age and the fact that he has not had seizures in a while but has a prior history of stroke.  However I do not believe this was secondary to CVA.  I added on an ethanol level but he denies any alcohol use.     Clinical Course as of 08/21/20 0354  Nancy Fetter Aug 21, 2020  0106 Lab work is reassuring.  His basic metabolic panel shows stable creatinine/kidney function.  CBC is within normal limits. [CF]  [id number] The patient has been resting comfortably.  No additional seizure-like activity.  Work-up has been reassuring.  No indication for further monitoring or medication changes.  He will take his morning lamotrigine and follow-up with his primary care doctor and neurologist, Dr. Manuella Ghazi.  He and his wife are both comfortable with the plan for discharge and outpatient follow-up.  I gave my usual and customary return precautions and follow-up recommendations. [CF]    Clinical Course User Index [CF] Hinda Kehr, MD      ____________________________________________  FINAL CLINICAL IMPRESSION(S) / ED DIAGNOSES  Final diagnoses:  Seizure-like activity ([place])     MEDICATIONS GIVEN DURING THIS VISIT:  Medications - No data to display   ED Discharge Orders    None      *Please note:  PHINEAS MCENROE was evaluated in Emergency Department on 08/21/2020 for the symptoms described in the history of present illness. He was evaluated in the context of the global COVID-19 pandemic, which necessitated consideration that the patient might be at risk for infection with the SARS-CoV-2 virus that causes COVID-19. Institutional protocols and algorithms that pertain to the evaluation of patients at risk for COVID-19 are in a state of rapid change based on information released by regulatory bodies including the CDC and federal and state organizations. These policies and algorithms were followed during the patient's care in the ED.  Some ED evaluations and interventions may be delayed as a result of limited staffing during and after the pandemic.*  Note:  This document was prepared using Dragon voice recognition software and may include unintentional dictation errors.   Hinda Kehr, MD 08/21/20 [phone]

## 2020-08-21 NOTE — ED Notes (Signed)
Patient and wife given warm blankets. Patient noted to be resting comfortably.

## 2020-08-21 NOTE — Discharge Instructions (Signed)

## 2020-08-21 NOTE — ED Notes (Signed)
Pt assisted to toilet- pt uses cane to walk

## 2020-08-21 NOTE — ED Notes (Signed)
Patient in stretcher, NAD noted. Patient A&Ox3, disoriented to time.  Wife bedside.

## 2020-08-22 LAB — LAMOTRIGINE LEVEL: Lamotrigine Lvl: 3.5 ug/mL (ref 2.0–20.0)

## 2020-08-26 DIAGNOSIS — R569 Unspecified convulsions: Secondary | ICD-10-CM | POA: Diagnosis not present

## 2020-08-26 DIAGNOSIS — M19012 Primary osteoarthritis, left shoulder: Secondary | ICD-10-CM | POA: Diagnosis not present

## 2020-08-26 DIAGNOSIS — M25512 Pain in left shoulder: Secondary | ICD-10-CM | POA: Diagnosis not present

## 2020-08-26 DIAGNOSIS — S2242XA Multiple fractures of ribs, left side, initial encounter for closed fracture: Secondary | ICD-10-CM | POA: Diagnosis not present

## 2020-09-28 DIAGNOSIS — I69354 Hemiplegia and hemiparesis following cerebral infarction affecting left non-dominant side: Secondary | ICD-10-CM | POA: Diagnosis not present

## 2020-09-28 DIAGNOSIS — M7542 Impingement syndrome of left shoulder: Secondary | ICD-10-CM | POA: Diagnosis not present

## 2020-09-28 DIAGNOSIS — M7502 Adhesive capsulitis of left shoulder: Secondary | ICD-10-CM | POA: Diagnosis not present

## 2020-09-28 DIAGNOSIS — M19012 Primary osteoarthritis, left shoulder: Secondary | ICD-10-CM | POA: Diagnosis not present

## 2020-09-28 DIAGNOSIS — G8929 Other chronic pain: Secondary | ICD-10-CM | POA: Diagnosis not present

## 2020-09-28 DIAGNOSIS — M25512 Pain in left shoulder: Secondary | ICD-10-CM | POA: Diagnosis not present

## 2020-10-14 DIAGNOSIS — F0151 Vascular dementia with behavioral disturbance: Secondary | ICD-10-CM | POA: Diagnosis not present

## 2020-10-14 DIAGNOSIS — I129 Hypertensive chronic kidney disease with stage 1 through stage 4 chronic kidney disease, or unspecified chronic kidney disease: Secondary | ICD-10-CM | POA: Diagnosis not present

## 2020-10-14 DIAGNOSIS — I69354 Hemiplegia and hemiparesis following cerebral infarction affecting left non-dominant side: Secondary | ICD-10-CM | POA: Diagnosis not present

## 2020-10-14 DIAGNOSIS — E7800 Pure hypercholesterolemia, unspecified: Secondary | ICD-10-CM | POA: Diagnosis not present

## 2020-10-14 DIAGNOSIS — Z125 Encounter for screening for malignant neoplasm of prostate: Secondary | ICD-10-CM | POA: Diagnosis not present

## 2020-10-14 DIAGNOSIS — Z6835 Body mass index (BMI) 35.0-35.9, adult: Secondary | ICD-10-CM | POA: Diagnosis not present

## 2020-10-14 DIAGNOSIS — Z794 Long term (current) use of insulin: Secondary | ICD-10-CM | POA: Diagnosis not present

## 2020-10-14 DIAGNOSIS — N1832 Chronic kidney disease, stage 3b: Secondary | ICD-10-CM | POA: Diagnosis not present

## 2020-10-14 DIAGNOSIS — E1122 Type 2 diabetes mellitus with diabetic chronic kidney disease: Secondary | ICD-10-CM | POA: Diagnosis not present

## 2020-11-09 DIAGNOSIS — M7542 Impingement syndrome of left shoulder: Secondary | ICD-10-CM | POA: Diagnosis not present

## 2020-11-09 DIAGNOSIS — M19012 Primary osteoarthritis, left shoulder: Secondary | ICD-10-CM | POA: Diagnosis not present

## 2020-11-09 DIAGNOSIS — M7502 Adhesive capsulitis of left shoulder: Secondary | ICD-10-CM | POA: Diagnosis not present

## 2020-11-09 DIAGNOSIS — G8929 Other chronic pain: Secondary | ICD-10-CM | POA: Diagnosis not present

## 2020-11-09 DIAGNOSIS — I69354 Hemiplegia and hemiparesis following cerebral infarction affecting left non-dominant side: Secondary | ICD-10-CM | POA: Diagnosis not present

## 2020-11-09 DIAGNOSIS — M25512 Pain in left shoulder: Secondary | ICD-10-CM | POA: Diagnosis not present

## 2020-11-15 ENCOUNTER — Ambulatory Visit: Payer: Medicare HMO | Attending: Sports Medicine | Admitting: Physical Therapy

## 2020-11-15 ENCOUNTER — Encounter: Payer: Self-pay | Admitting: Physical Therapy

## 2020-11-15 DIAGNOSIS — M25512 Pain in left shoulder: Secondary | ICD-10-CM | POA: Diagnosis not present

## 2020-11-15 DIAGNOSIS — G8929 Other chronic pain: Secondary | ICD-10-CM | POA: Diagnosis not present

## 2020-11-15 NOTE — Therapy (Addendum)
[facility] [address], [place], [address] Phone: [phone]   Fax:  [phone]  Physical Therapy Evaluation  Patient Details  Name: Daniel [name] MRN: [id number] Date of Birth: [birth year] Referring Provider (PT): Dr. Manuella Ghazi   Encounter Date: 11/15/2020   PT End of Session - 11/15/20 1317     Visit Number 1    Number of Visits 17    Date for PT Re-Evaluation 12/13/20    PT Start Time 1115    PT Stop Time 1202    PT Time Calculation (min) 47 min    Activity Tolerance Patient tolerated treatment well    Behavior During Therapy [facility] for tasks assessed/performed             Past Medical History:  Diagnosis Date   Diabetes mellitus without complication ([place])    Hypertension    Pancreatitis    Seizures ([place])    Stroke [facility])    Stroke ([place]) 2016    Past Surgical History:  Procedure Laterality Date   COLONOSCOPY WITH PROPOFOL N/A 10/07/2019   Procedure: COLONOSCOPY WITH PROPOFOL;  Surgeon: Toledo, Benay Pike, MD;  Location: [facility] ENDOSCOPY;  Service: Gastroenterology;  Laterality: N/A;   ESOPHAGOGASTRODUODENOSCOPY     ESOPHAGOGASTRODUODENOSCOPY (EGD) WITH PROPOFOL N/A 10/07/2019   Procedure: ESOPHAGOGASTRODUODENOSCOPY (EGD) WITH PROPOFOL;  Surgeon: Toledo, Benay Pike, MD;  Location: [facility] ENDOSCOPY;  Service: Gastroenterology;  Laterality: N/A;   TEE WITHOUT CARDIOVERSION N/A 11/01/2014   Procedure: TRANSESOPHAGEAL ECHOCARDIOGRAM (TEE);  Surgeon: Wellington Hampshire, MD;  Location: [facility] ORS;  Service: Cardiovascular;  Laterality: N/A;    There were no vitals filed for this visit.    Subjective Assessment - 11/15/20 1116     Subjective Daniel [name] is 65 year old male who presents to therapy for L shoulder pain secondary to R CVA (L sided hemiparesis) on 10/27/2014. Patient describes his pain as a burning pain that radiates down the L UE and has occured since his stroke. Pt reprorts he had more motion  following stroke, but that it got better and he could reach overhead, but it has been worsening over past "couple years. Pt states that it is difficult to use his L UE to assist in ADLs such as clothing, bathing, toileting and reaching for items above waist level. He states that his wife assists him with all ADLs and that they have a cane, shower chair, and a RW. He does enter the clinic today without an AD. Pt reports falling twice since January 2021 and has a history of previous falls in which he said that he fell onto his L shoulder. His PMH includes CVA, seizures, memory loss, depression, and diabetes. He denies any history of cancer but does admit to having night pain due to shoulder that prevents him from sleeping.    Patient is accompained by: Family member    Limitations Standing;Walking;House hold activities;Lifting    How long can you sit comfortably? no limitations    How long can you stand comfortably? Unsteady with all standing and mobility    How long can you walk comfortably? unsteady with all mobility- increased risk of falling.    Patient Stated Goals To increase functional mobility of L UE.    Currently in Pain? Yes    Pain Score 6     Pain Location Shoulder    Pain Orientation Left    Pain Descriptors / Indicators Burning    Pain Type Chronic pain  Pain Onset More than a month ago    Aggravating Factors  lying on L UE    Pain Relieving Factors tylenol    Effect of Pain on Daily Activities limiting    Multiple Pain Sites No              Objective:  L Shoulder PROM  Supine Flexion: 92 deg Extension: not measured this day Abduction: 55 deg IR/ER: very minimal; not formally measured unable to attain test position   L Shoulder AROM approximates Flexion: 30 deg in sitting Abduction: 20 deg in sitting IR: to belly deg in sitting ER: below belt line deg in sitting  L Elbow AROM   Supine  Flexion: 60 deg Extension: 15 deg  L shoulder strength  Grossly  2-/5 in sitting  Observation/Palpation  (+) Sulcus sign 98mm Firm Joint End Feel  Hypertonicity in L UE Inability to open hand  Scapulo-humeral Rhythm: shoulder hiking with upper trap activation for shoulder elevation   Sit<>Stand transfer: close supervision Supine<>Sit transfer: min assist Rolling L<>R: min assist  Skin intact/sensation diminished L UE  Posture: forward head, rounded shoulder, thoracic kyphosis; depressed L shoulder with slight elbow flex guarding, L hand in fist  Gait: decreased step length on L LE; shuffling  Palpation hypertonic with trigger points to UT, deltoid group, pec minor   Reflexes *deferred   Therapeutic Exercise PT reviewed the following HEP with patient with patient able to demonstrate a set of the following with min cuing for correction needed. PT educated patient on parameters of therex (how/when to inc/decrease intensity, frequency, rep/set range, stretch hold time, and purpose of therex) with verbalized understanding.  *Educated patient on repetitions, sets, frequency, positions, and technique.               Objective measurements completed on examination: See above findings.               PT Education - 11/15/20 1316     Education provided Yes    Education Details Patient educated on POC and the intitial HEP.    Person(s) Educated Patient    Methods Explanation;Demonstration;Tactile cues;Verbal cues    Comprehension Verbalized understanding;Returned demonstration              PT Short Term Goals - 11/15/20 1321       PT SHORT TERM GOAL #1   Title Pt will demonstrate independence with HEP to improve UE function for increased ability to participate with ADLs    Baseline Pt currently does not have a HEP.    Time 4    Period Weeks    Status New    Target Date 12/13/20               PT Long Term Goals - 11/15/20 1326       PT LONG TERM GOAL #1   Title Pt will demonstrate 90 degrees of active  shoulder flexion & abduction ROM with L UE in order to reach objects at shoulder level and decrease burden on caregiver.    Baseline 11/15/20 Pt approximate L shoulder AROM is currently 30 deg flexion and 20 deg abduction    Time 12    Period Weeks    Status New    Target Date 02/07/21      PT LONG TERM GOAL #2   Title Pt will demostrate 3+/5 strength within available L UE ROM in order to perform ADLs to decrease burden on caregiver.    Baseline 11/15/20  Pt current strength grossly 2/5 L UE in availible range    Time 12    Period Weeks    Status New      PT LONG TERM GOAL #3   Title Pt will improve FOTO score to 63 in order to demostrate improvement functional mobilty and quality of life.    Baseline 11/15/20 40    Time 12    Period Weeks    Status New      PT LONG TERM GOAL #4   Title Pt will decrease worst pain as reported on NPRS by at least 3 points in order to demonstrate clinically significant reduction in pain.    Baseline 11/15/20 9/10    Time 12    Period Weeks    Status New                    Plan - 11/15/20 1340     Clinical Impression Statement Patient is a 65 year old male with history of R CVA with L sided non-dominant hemiparesis in 2016. Pt primary concern is L shoulder pain and would like to regain use of L UE and become more independent with ADLs. Pt exhibits significantly decreased L UE ROM, strength, and activity tolerance. Patient demonstrated diminished sensation in L UE, decreased ability to open L hand, and inability to elevate L UE above naval. Pt deficits prevent functional use of L UE to aid in eating, dressing, reaching, lifting, transfers and utilizing a RW; inhibiting full participation in ADLs. Patient would benefit from skilled physical therapy in order to improve functional mobility of L UE to aid in use of AD, decrease burden on caregiver, and decrease risk of further injury.    Personal Factors and Comorbidities Time since onset of  injury/illness/exacerbation;Comorbidity 3+;Education    Comorbidities syncope, seizure, Diabetes    Examination-Activity Limitations Bathing;Bed Mobility;Bend;Carry;Hygiene/Grooming;Lift;Locomotion Level;Reach Overhead;Squat;Stairs;Stand;Toileting;Transfers    Examination-Participation Restrictions Cleaning;Community Activity;Driving;Yard Work;Meal Prep;Medication Management;Shop;Laundry    Stability/Clinical Decision Making Evolving/Moderate complexity    Clinical Decision Making Moderate    Rehab Potential Fair    PT Frequency 2x / week    PT Duration 12 weeks    PT Treatment/Interventions ADLs/Self Care Home Management;Therapeutic exercise;Therapeutic activities;Functional mobility training;Stair training;Gait training;DME Instruction;Balance training;Neuromuscular re-education;Manual techniques;Splinting;Passive range of motion;Visual/perceptual remediation/compensation;Patient/family education;Orthotic Fit/Training;Wheelchair mobility training;Dry needling;Moist Heat;Cryotherapy;Traction;Electrical Stimulation    PT Next Visit Plan outcome measures     PT Home Exercise Plan AAROM flex abd    Consulted and Agree with Plan of Care Patient             Patient will benefit from skilled therapeutic intervention in order to improve the following deficits and impairments:  Abnormal gait, Impaired tone, Decreased balance, Decreased mobility, Decreased endurance, Difficulty walking, Impaired sensation, Decreased range of motion, Improper body mechanics, Impaired UE functional use, Impaired flexibility, Decreased strength, Decreased coordination, Decreased activity tolerance, Increased fascial restricitons  Visit Diagnosis: Chronic left shoulder pain     Problem List Patient Active Problem List   Diagnosis Date Noted   Syncope 01/02/2020   Syncope, vasovagal 01/01/2020   Closed [place] II fracture ([place]) 01/01/2020   History of CVA (cerebrovascular accident) 01/01/2020   CKD (chronic  kidney disease), stage II    Recurrent seizures ([place]) 07/13/2019   Seizure disorder ([place]) 07/12/2019   HLD (hyperlipidemia) 07/12/2019   Type 2 diabetes mellitus with hyperlipidemia ([place]) 07/12/2019   CKD (chronic kidney disease), stage IIIb 07/12/2019   AKI (acute kidney injury) ([place]) 06/13/2019  Hyperkalemia 06/13/2019   Obesity (BMI 30-39.9) 06/13/2019   Depression 06/13/2019   BPH (benign prostatic hyperplasia) 06/13/2019   Peripheral neuropathy 06/13/2019   DKA (diabetic ketoacidoses) 08/06/8116   Acute metabolic encephalopathy 86/77/3736   Stroke ([place]) 10/27/2014   Lymphadenitis 04/09/2011   HTN (hypertension) 04/09/2011   Uncontrolled diabetes mellitus with complications ([place]) 68/15/9470      Chelsea Peterson DPT Sharion Settler, SPT  Durwin Reges 11/15/2020, 4:02 PM  [facility] [address] [place], [address] Phone: [phone]   Fax:  [phone]  Name: ALLEN EGERTON MRN: [id number] Date of Birth: [birth year]

## 2020-11-16 NOTE — Addendum Note (Signed)
Addended by: Kelton Pillar on: 11/16/2020 08:32 AM   Modules accepted: Orders

## 2020-11-22 ENCOUNTER — Encounter: Payer: Self-pay | Admitting: Physical Therapy

## 2020-11-22 ENCOUNTER — Ambulatory Visit: Payer: Medicare HMO | Admitting: Physical Therapy

## 2020-11-22 DIAGNOSIS — M25512 Pain in left shoulder: Secondary | ICD-10-CM | POA: Diagnosis not present

## 2020-11-22 DIAGNOSIS — G8929 Other chronic pain: Secondary | ICD-10-CM | POA: Diagnosis not present

## 2020-11-22 NOTE — Therapy (Signed)
[facility] [address] [place], [address] Phone: [phone]   Fax:  [phone]  Physical Therapy Treatment  Patient Details  Name: Daniel [name] MRN: [id number] Date of Birth: [birth year] Referring Provider (PT): Dr. Manuella Ghazi   Encounter Date: 11/22/2020   PT End of Session - 11/22/20 1114     Visit Number 2    Number of Visits 17    Date for PT Re-Evaluation 12/13/20    PT Start Time 1115    PT Stop Time 1200    PT Time Calculation (min) 45 min    Equipment Utilized During Treatment Gait belt    Activity Tolerance Patient tolerated treatment well    Behavior During Therapy [facility] for tasks assessed/performed             Past Medical History:  Diagnosis Date   Diabetes mellitus without complication ([place])    Hypertension    Pancreatitis    Seizures ([place])    Stroke ([place])    Stroke ([place]) 2016    Past Surgical History:  Procedure Laterality Date   COLONOSCOPY WITH PROPOFOL N/A 10/07/2019   Procedure: COLONOSCOPY WITH PROPOFOL;  Surgeon: Toledo, Benay Pike, MD;  Location: [facility] ENDOSCOPY;  Service: Gastroenterology;  Laterality: N/A;   ESOPHAGOGASTRODUODENOSCOPY     ESOPHAGOGASTRODUODENOSCOPY (EGD) WITH PROPOFOL N/A 10/07/2019   Procedure: ESOPHAGOGASTRODUODENOSCOPY (EGD) WITH PROPOFOL;  Surgeon: Toledo, Benay Pike, MD;  Location: [facility] ENDOSCOPY;  Service: Gastroenterology;  Laterality: N/A;   TEE WITHOUT CARDIOVERSION N/A 11/01/2014   Procedure: TRANSESOPHAGEAL ECHOCARDIOGRAM (TEE);  Surgeon: Wellington Hampshire, MD;  Location: [facility] ORS;  Service: Cardiovascular;  Laterality: N/A;    There were no vitals filed for this visit.   Subjective Assessment - 11/22/20 1123     Subjective Pt reports to therapy following eval with decreased pain. He rates his pain as 2/10 on NPRS. He states that he has been somewhat compliant with HEP. He presents today using his SPC.    Pertinent History Patient is a pleasant 65  year old male referred by [facility], for a wheelchair evaluation. Patient experienced a CVA with left sided hemiparesis in 2016. An MRI confirmed Right MCA on 10/27/2014. Patient presents to clinic today stating he a non-functional Left UE, altered sensation on left side of body with difficulty with all mobility and dependent on wife to assist with all ADLs. He states he does not have any type of wheeled mobility devices other than a front wheeled walker and states he has a cane as well but unable to use due to poor balance and increased risk of falling. His past medical history includes history of seizure, memory loss following CVA in 2016, Depression, and Diabetes. Patient reports unable to use a walker due to poor overall Left UE functional use and unsteady with a cane in home with 3 reported falls in past 6 months.    Limitations Standing;Walking;House hold activities;Lifting    How long can you stand comfortably? Unsteady with all standing and mobility    How long can you walk comfortably? unsteady with all mobility- increased risk of falling.    Patient Stated Goals To increase functional mobility of L UE.             Therapeutic Exercise  AAROM Pulleys flexion/scaption 3 x 90 sec each direction  AAROM with SPC (dowel) flex 2 x 90 sec  L Upper trap stretch with R cervical sidebending 3 x 30 sec  Manual Therapy  PROM flexion & abduction L shoulder & L elbow flexion  MET (reciprocal inhibition) with 5 sec hold with stretch to tolerable end range Flex/ABD  STM to L Upper trap to increase L shoulder ROM and decrease shoulder hiking    Ther-Ex PT reviewed the following HEP with patient with patient able to demonstrate a set of the following with min cuing for correction needed. PT educated patient on parameters of therex (how/when to inc/decrease intensity, frequency, rep/set range, stretch hold time, and purpose of therex) with verbalized  understanding.   Observations:  Continues L shoulder hiking Increased tone with elbow flexion Patient improved ability to extend L phalanges (open fist)                      PT Education - 11/22/20 1249     Education Details Pt educated on updated HEP and importance of active participation to improve therapy outcomes.    Person(s) Educated Patient    Methods Explanation;Demonstration;Tactile cues;Verbal cues    Comprehension Verbalized understanding;Returned demonstration              PT Short Term Goals - 11/15/20 1321       PT SHORT TERM GOAL #1   Title Pt will demonstrate independence with HEP to improve UE function for increased ability to participate with ADLs    Baseline Pt currently does not have a HEP.    Time 4    Period Weeks    Status New    Target Date 12/13/20               PT Long Term Goals - 11/15/20 1326       PT LONG TERM GOAL #1   Title Pt will demonstrate 90 degrees of active shoulder flexion & abduction ROM with L UE in order to reach objects at shoulder level and decrease burden on caregiver.    Baseline 11/15/20 Pt approximate L shoulder AROM is currently 30 deg flexion and 20 deg abduction    Time 12    Period Weeks    Status New    Target Date 02/07/21      PT LONG TERM GOAL #2   Title Pt will demostrate 3+/5 strength within available L UE ROM in order to perform ADLs to decrease burden on caregiver.    Baseline 11/15/20 Pt current strength grossly 2/5 L UE in availible range    Time 12    Period Weeks    Status New      PT LONG TERM GOAL #3   Title Pt will improve FOTO score to 63 in order to demostrate improvement functional mobilty and quality of life.    Baseline 11/15/20 40    Time 12    Period Weeks    Status New      PT LONG TERM GOAL #4   Title Pt will decrease worst pain as reported on NPRS by at least 3 points in order to demonstrate clinically significant reduction in pain.    Baseline 11/15/20 9/10     Time 12    Period Weeks    Status New                   Plan - 11/22/20 1251     Clinical Impression Statement Patient tolerated session well evidenced by no increase in pain at end of session. Pt had noticable increase in L shoulder AROM (flex) and PROM (flex/abd). Pt will continue  to benefit from skilled therapy in order to improve L shoulder function and achieve set goals. Plan next session to review updated HEP and manual therapy.    Personal Factors and Comorbidities Time since onset of injury/illness/exacerbation;Comorbidity 3+;Education    Comorbidities syncope, seizure, Diabetes    Examination-Activity Limitations Bathing;Bed Mobility;Bend;Carry;Hygiene/Grooming;Lift;Locomotion Level;Reach Overhead;Squat;Stairs;Stand;Toileting;Transfers    Examination-Participation Restrictions Cleaning;Community Activity;Driving;Yard Work;Meal Prep;Medication Management;Shop;Laundry    PT Treatment/Interventions ADLs/Self Care Home Management;Therapeutic exercise;Therapeutic activities;Functional mobility training;Stair training;Gait training;DME Instruction;Balance training;Neuromuscular re-education;Manual techniques;Splinting;Passive range of motion;Visual/perceptual remediation/compensation;Patient/family education;Orthotic Fit/Training;Wheelchair mobility training;Dry needling;Moist Heat;Cryotherapy;Traction;Electrical Stimulation    PT Next Visit Plan outcome measures, manual    PT Home Exercise Plan AAROM flex abd    Family Member Consulted [name]- wife             Patient will benefit from skilled therapeutic intervention in order to improve the following deficits and impairments:  Abnormal gait, Impaired tone, Decreased balance, Decreased mobility, Decreased endurance, Difficulty walking, Impaired sensation, Decreased range of motion, Improper body mechanics, Impaired UE functional use, Impaired flexibility, Decreased strength, Decreased coordination, Decreased activity  tolerance, Increased fascial restricitons  Visit Diagnosis: Chronic left shoulder pain     Problem List Patient Active Problem List   Diagnosis Date Noted   Syncope 01/02/2020   Syncope, vasovagal 01/01/2020   Closed [place] II fracture ([place]) 01/01/2020   History of CVA (cerebrovascular accident) 01/01/2020   CKD (chronic kidney disease), stage II    Recurrent seizures ([place]) 07/13/2019   Seizure disorder ([place]) 07/12/2019   HLD (hyperlipidemia) 07/12/2019   Type 2 diabetes mellitus with hyperlipidemia ([place]) 07/12/2019   CKD (chronic kidney disease), stage IIIb 07/12/2019   AKI (acute kidney injury) ([place]) 06/13/2019   Hyperkalemia 06/13/2019   Obesity (BMI 30-39.9) 06/13/2019   Depression 06/13/2019   BPH (benign prostatic hyperplasia) 06/13/2019   Peripheral neuropathy 06/13/2019   DKA (diabetic ketoacidoses) 73/42/8768   Acute metabolic encephalopathy 11/57/2620   Stroke ([place]) 10/27/2014   Lymphadenitis 04/09/2011   HTN (hypertension) 04/09/2011   Uncontrolled diabetes mellitus with complications ([place]) 35/59/7416     Chelsea Peterson DPT  Sharion Settler, SPT  Durwin Reges 11/22/2020, 2:09 PM  [facility] [address] [place], [address] Phone: [phone]   Fax:  [phone]  Name: Daniel [name] MRN: [id number] Date of Birth: [birth year]

## 2020-11-24 ENCOUNTER — Ambulatory Visit: Payer: Medicare HMO | Admitting: Physical Therapy

## 2020-11-24 ENCOUNTER — Other Ambulatory Visit: Payer: Self-pay

## 2020-11-24 ENCOUNTER — Encounter: Payer: Self-pay | Admitting: Physical Therapy

## 2020-11-24 VITALS — BP 108/67 | HR 48

## 2020-11-24 DIAGNOSIS — M25512 Pain in left shoulder: Secondary | ICD-10-CM | POA: Diagnosis not present

## 2020-11-24 DIAGNOSIS — G8929 Other chronic pain: Secondary | ICD-10-CM | POA: Diagnosis not present

## 2020-11-24 NOTE — Therapy (Signed)
[facility] [address] [place], [address] Phone: [phone]   Fax:  [phone]  Physical Therapy Treatment  Patient Details  Name: CLELL TRAHAN MRN: [id number] Date of Birth: 01/14/1956 Referring Provider (PT): Dr. Manuella Ghazi   Encounter Date: 11/24/2020   PT End of Session - 11/24/20 0847     Visit Number 3    Number of Visits 17    Date for PT Re-Evaluation 12/13/20    PT Start Time 2229    PT Stop Time 0940    PT Time Calculation (min) 45 min    Activity Tolerance Patient limited by lethargy    Behavior During Therapy [facility] for tasks assessed/performed             Past Medical History:  Diagnosis Date   Diabetes mellitus without complication ([place])    Hypertension    Pancreatitis    Seizures ([place])    Stroke [facility])    Stroke ([place]) 2016    Past Surgical History:  Procedure Laterality Date   COLONOSCOPY WITH PROPOFOL N/A 10/07/2019   Procedure: COLONOSCOPY WITH PROPOFOL;  Surgeon: Toledo, Benay Pike, MD;  Location: [facility] ENDOSCOPY;  Service: Gastroenterology;  Laterality: N/A;   ESOPHAGOGASTRODUODENOSCOPY     ESOPHAGOGASTRODUODENOSCOPY (EGD) WITH PROPOFOL N/A 10/07/2019   Procedure: ESOPHAGOGASTRODUODENOSCOPY (EGD) WITH PROPOFOL;  Surgeon: Toledo, Benay Pike, MD;  Location: [facility] ENDOSCOPY;  Service: Gastroenterology;  Laterality: N/A;   TEE WITHOUT CARDIOVERSION N/A 11/01/2014   Procedure: TRANSESOPHAGEAL ECHOCARDIOGRAM (TEE);  Surgeon: Wellington Hampshire, MD;  Location: [facility] ORS;  Service: Cardiovascular;  Laterality: N/A;    Vitals:   11/24/20 0934  BP: 108/67  Pulse: (!) 48     Subjective Assessment - 11/24/20 0949     Subjective Pt reports he is active in HEP. Pt is very lethargic today and reports he is very sleepy because his wife gave him a tylenol PM by mistake. He denies having any pain.                          Therapeutic Exercise  Pulleys 3 x 90 sec flex/abd with  assist to place hand around handle  SEATED TRX: AAROM flex with multimodal cues to lean forward to lift arm up    Manual Therapy  Grade 2-3 P-A & Inferior Mobilizations with movement  UE Ranger/PROM FLEX/ABD           PT Education - 11/24/20 1052     Education provided Yes    Education Details therex form/technique, medication management    Person(s) Educated Patient    Methods Explanation;Verbal cues;Tactile cues;Demonstration    Comprehension Verbalized understanding;Returned demonstration;Verbal cues required              PT Short Term Goals - 11/15/20 1321       PT SHORT TERM GOAL #1   Title Pt will demonstrate independence with HEP to improve UE function for increased ability to participate with ADLs    Baseline Pt currently does not have a HEP.    Time 4    Period Weeks    Status New    Target Date 12/13/20               PT Long Term Goals - 11/15/20 1326       PT LONG TERM GOAL #1   Title Pt will demonstrate 90 degrees of active shoulder flexion & abduction ROM with L  UE in order to reach objects at shoulder level and decrease burden on caregiver.    Baseline 11/15/20 Pt approximate L shoulder AROM is currently 30 deg flexion and 20 deg abduction    Time 12    Period Weeks    Status New    Target Date 02/07/21      PT LONG TERM GOAL #2   Title Pt will demostrate 3+/5 strength within available L UE ROM in order to perform ADLs to decrease burden on caregiver.    Baseline 11/15/20 Pt current strength grossly 2/5 L UE in availible range    Time 12    Period Weeks    Status New      PT LONG TERM GOAL #3   Title Pt will improve FOTO score to 63 in order to demostrate improvement functional mobilty and quality of life.    Baseline 11/15/20 40    Time 12    Period Weeks    Status New      PT LONG TERM GOAL #4   Title Pt will decrease worst pain as reported on NPRS by at least 3 points in order to demonstrate clinically significant reduction  in pain.    Baseline 11/15/20 9/10    Time 12    Period Weeks    Status New                   Plan - 11/24/20 0954     Clinical Impression Statement --    Personal Factors and Comorbidities Time since onset of injury/illness/exacerbation;Comorbidity 3+;Education    Comorbidities syncope, seizure, Diabetes    Examination-Activity Limitations Bathing;Bed Mobility;Bend;Carry;Hygiene/Grooming;Lift;Locomotion Level;Reach Overhead;Squat;Stairs;Stand;Toileting;Transfers    Examination-Participation Restrictions Cleaning;Community Activity;Driving;Yard Work;Meal Prep;Medication Management;Shop;Laundry    PT Treatment/Interventions ADLs/Self Care Home Management;Therapeutic exercise;Therapeutic activities;Functional mobility training;Stair training;Gait training;DME Instruction;Balance training;Neuromuscular re-education;Manual techniques;Splinting;Passive range of motion;Visual/perceptual remediation/compensation;Patient/family education;Orthotic Fit/Training;Wheelchair mobility training;Dry needling;Moist Heat;Cryotherapy;Traction;Electrical Stimulation    PT Next Visit Plan outcome measures, manual    PT Home Exercise Plan AAROM flex abd             Patient will benefit from skilled therapeutic intervention in order to improve the following deficits and impairments:  Abnormal gait, Impaired tone, Decreased balance, Decreased mobility, Decreased endurance, Difficulty walking, Impaired sensation, Decreased range of motion, Improper body mechanics, Impaired UE functional use, Impaired flexibility, Decreased strength, Decreased coordination, Decreased activity tolerance, Increased fascial restricitons  Visit Diagnosis: Chronic left shoulder pain     Problem List Patient Active Problem List   Diagnosis Date Noted   Syncope 01/02/2020   Syncope, vasovagal 01/01/2020   Closed [place] II fracture ([place]) 01/01/2020   History of CVA (cerebrovascular accident) 01/01/2020   CKD (chronic  kidney disease), stage II    Recurrent seizures ([place]) 07/13/2019   Seizure disorder ([place]) 07/12/2019   HLD (hyperlipidemia) 07/12/2019   Type 2 diabetes mellitus with hyperlipidemia ([place]) 07/12/2019   CKD (chronic kidney disease), stage IIIb 07/12/2019   AKI (acute kidney injury) ([place]) 06/13/2019   Hyperkalemia 06/13/2019   Obesity (BMI 30-39.9) 06/13/2019   Depression 06/13/2019   BPH (benign prostatic hyperplasia) 06/13/2019   Peripheral neuropathy 06/13/2019   DKA (diabetic ketoacidoses) 16/96/7893   Acute metabolic encephalopathy 81/05/7508   Stroke ([place]) 10/27/2014   Lymphadenitis 04/09/2011   HTN (hypertension) 04/09/2011   Uncontrolled diabetes mellitus with complications ([place]) 25/85/2778     Chelsea Peterson DPT Sharion Settler, SPT  Durwin Reges 11/24/2020, 10:54 AM  [facility] [address], [place], [address] Phone: [phone]   Fax:  [phone]  Name: ERIBERTO FELCH MRN: [id number] Date of Birth: 1955/12/08

## 2020-11-30 ENCOUNTER — Encounter: Payer: Self-pay | Admitting: Physical Therapy

## 2020-11-30 ENCOUNTER — Ambulatory Visit: Payer: Medicare HMO | Attending: Sports Medicine | Admitting: Physical Therapy

## 2020-11-30 VITALS — BP 133/93 | HR 78

## 2020-11-30 DIAGNOSIS — M25512 Pain in left shoulder: Secondary | ICD-10-CM | POA: Insufficient documentation

## 2020-11-30 DIAGNOSIS — G8929 Other chronic pain: Secondary | ICD-10-CM | POA: Diagnosis not present
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
## 2020-12-05 ENCOUNTER — Ambulatory Visit: Payer: Medicare HMO | Admitting: Physical Therapy

## 2020-12-05 ENCOUNTER — Other Ambulatory Visit: Payer: Self-pay

## 2020-12-05 ENCOUNTER — Encounter: Payer: Self-pay | Admitting: Physical Therapy

## 2020-12-05 DIAGNOSIS — M25512 Pain in left shoulder: Secondary | ICD-10-CM

## 2020-12-05 DIAGNOSIS — G8929 Other chronic pain: Secondary | ICD-10-CM | POA: Diagnosis not present

## 2020-12-05 NOTE — Therapy (Signed)
[place] [facility] [address] [place], [address] Phone: [phone]   Fax:  [phone]  Physical Therapy Treatment  Patient Details  Name: Daniel [name] MRN: [id number] Date of Birth: 20-Mar-1956 Referring Provider (PT): Dr. Manuella Ghazi   Encounter Date: 12/05/2020   PT End of Session - 12/05/20 0821     Visit Number 5    Number of Visits 17    Date for PT Re-Evaluation 12/13/20    PT Start Time 0817    PT Stop Time 0901    PT Time Calculation (min) 44 min    Equipment Utilized During Treatment Gait belt    Behavior During Therapy [facility] for tasks assessed/performed             Past Medical History:  Diagnosis Date   Diabetes mellitus without complication ([place])    Hypertension    Pancreatitis    Seizures ([place])    Stroke ([place])    Stroke ([place]) 2016    Past Surgical History:  Procedure Laterality Date   COLONOSCOPY WITH PROPOFOL N/A 10/07/2019   Procedure: COLONOSCOPY WITH PROPOFOL;  Surgeon: Toledo, Benay Pike, MD;  Location: [facility] ENDOSCOPY;  Service: Gastroenterology;  Laterality: N/A;   ESOPHAGOGASTRODUODENOSCOPY     ESOPHAGOGASTRODUODENOSCOPY (EGD) WITH PROPOFOL N/A 10/07/2019   Procedure: ESOPHAGOGASTRODUODENOSCOPY (EGD) WITH PROPOFOL;  Surgeon: Toledo, Benay Pike, MD;  Location: [facility] ENDOSCOPY;  Service: Gastroenterology;  Laterality: N/A;   TEE WITHOUT CARDIOVERSION N/A 11/01/2014   Procedure: TRANSESOPHAGEAL ECHOCARDIOGRAM (TEE);  Surgeon: Wellington Hampshire, MD;  Location: [facility] ORS;  Service: Cardiovascular;  Laterality: N/A;    There were no vitals filed for this visit.   Subjective Assessment - 12/05/20 0824     Subjective Pt reports to therapy stating that he has fallen bending over to pick something up from the floor. He doesnt report any injury but that he has not had the opportunity to obtain another [facility]. Pt has educated the patient on importance of ambulating with an AD.    Pertinent History  Patient is a pleasant 65 year old male referred by [facility], for a wheelchair evaluation. Patient experienced a CVA with left sided hemiparesis in 2016. An MRI confirmed Right MCA on 10/27/2014. Patient presents to clinic today stating he a non-functional Left UE, altered sensation on left side of body with difficulty with all mobility and dependent on wife to assist with all ADLs. He states he does not have any type of wheeled mobility devices other than a front wheeled walker and states he has a cane as well but unable to use due to poor balance and increased risk of falling. His past medical history includes history of seizure, memory loss following CVA in 2016, Depression, and Diabetes. Patient reports unable to use a walker due to poor overall Left UE functional use and unsteady with a cane in home with 3 reported falls in past 6 months.    Limitations Standing;Walking;House hold activities;Lifting                   Therapeutic Exercise   AAROM Pulleys flexion/scaption 3 x 90 sec each direction * cues to relax L shoulder and let the R bring it as far as possible   AAROM with PVC flex/ ABD2 x 90 sec   L Upper trap stretch with R cervical sidebending 3 x 30 sec   Manual Therapy   PROM flexion & abduction L shoulder    STM to  L Pec major to increase L shoulder ABD/Flex  MET (autogenic inhibition) Pec Major for 5 sec on/off Shouder in approximately 80 deg abd neutral IR/ER  *constant cues to relax chest and shoulder for PROM and STM                      PT Education - 12/05/20 0905     Education Details Educated on ambulating with a SPC for safety.    Person(s) Educated Patient;Spouse    Methods Explanation    Comprehension Verbalized understanding              PT Short Term Goals - 11/15/20 1321       PT SHORT TERM GOAL #1   Title Pt will demonstrate independence with HEP to improve UE function for increased ability to participate with ADLs     Baseline Pt currently does not have a HEP.    Time 4    Period Weeks    Status New    Target Date 12/13/20               PT Long Term Goals - 11/15/20 1326       PT LONG TERM GOAL #1   Title Pt will demonstrate 90 degrees of active shoulder flexion & abduction ROM with L UE in order to reach objects at shoulder level and decrease burden on caregiver.    Baseline 11/15/20 Pt approximate L shoulder AROM is currently 30 deg flexion and 20 deg abduction    Time 12    Period Weeks    Status New    Target Date 02/07/21      PT LONG TERM GOAL #2   Title Pt will demostrate 3+/5 strength within available L UE ROM in order to perform ADLs to decrease burden on caregiver.    Baseline 11/15/20 Pt current strength grossly 2/5 L UE in availible range    Time 12    Period Weeks    Status New      PT LONG TERM GOAL #3   Title Pt will improve FOTO score to 63 in order to demostrate improvement functional mobilty and quality of life.    Baseline 11/15/20 40    Time 12    Period Weeks    Status New      PT LONG TERM GOAL #4   Title Pt will decrease worst pain as reported on NPRS by at least 3 points in order to demonstrate clinically significant reduction in pain.    Baseline 11/15/20 9/10    Time 12    Period Weeks    Status New                   Plan - 12/05/20 0905     Clinical Impression Statement Pt tolerated session well evidenced by no reports of increased L shoulder pain at end of session. Pt continue to demonstrate decreased L shoulder ROM, pec major hypertonicity,and pain. Pt requires multimodal cueing to perform tasks and ensure proper form. PT focused on manual techniques and stretching this session to improve L shoulder flexion and abduction. PT also educated patient and spouse on obtainind an cane.    Comorbidities syncope, seizure, Diabetes    Examination-Activity Limitations Bathing;Bed Mobility;Bend;Carry;Hygiene/Grooming;Lift;Locomotion Level;Reach  Overhead;Squat;Stairs;Stand;Toileting;Transfers    Examination-Participation Restrictions Cleaning;Community Activity;Driving;Yard Work;Meal Prep;Medication Management;Shop;Laundry    Rehab Potential Fair    PT Frequency 2x / week    PT Duration 12 weeks  PT Treatment/Interventions ADLs/Self Care Home Management;Therapeutic exercise;Therapeutic activities;Functional mobility training;Stair training;Gait training;DME Instruction;Balance training;Neuromuscular re-education;Manual techniques;Splinting;Passive range of motion;Visual/perceptual remediation/compensation;Patient/family education;Orthotic Fit/Training;Wheelchair mobility training;Dry needling;Moist Heat;Cryotherapy;Traction;Electrical Stimulation    PT Next Visit Plan outcome measures, manual    PT Home Exercise Plan AAROM flex abd    Consulted and Agree with Plan of Care Patient    Family Member Consulted [name]- wife             Patient will benefit from skilled therapeutic intervention in order to improve the following deficits and impairments:  Abnormal gait, Impaired tone, Decreased balance, Decreased mobility, Decreased endurance, Difficulty walking, Impaired sensation, Decreased range of motion, Improper body mechanics, Impaired UE functional use, Impaired flexibility, Decreased strength, Decreased coordination, Decreased activity tolerance, Increased fascial restricitons  Visit Diagnosis: Chronic left shoulder pain     Problem List Patient Active Problem List   Diagnosis Date Noted   Syncope 01/02/2020   Syncope, vasovagal 01/01/2020   Closed [place] II fracture ([place]) 01/01/2020   History of CVA (cerebrovascular accident) 01/01/2020   CKD (chronic kidney disease), stage II    Recurrent seizures ([place]) 07/13/2019   Seizure disorder ([place]) 07/12/2019   HLD (hyperlipidemia) 07/12/2019   Type 2 diabetes mellitus with hyperlipidemia ([place]) 07/12/2019   CKD (chronic kidney disease), stage IIIb 07/12/2019   AKI  (acute kidney injury) ([place]) 06/13/2019   Hyperkalemia 06/13/2019   Obesity (BMI 30-39.9) 06/13/2019   Depression 06/13/2019   BPH (benign prostatic hyperplasia) 06/13/2019   Peripheral neuropathy 06/13/2019   DKA (diabetic ketoacidoses) 78/67/6720   Acute metabolic encephalopathy 94/70/9628   Stroke ([place]) 10/27/2014   Lymphadenitis 04/09/2011   HTN (hypertension) 04/09/2011   Uncontrolled diabetes mellitus with complications ([place]) 36/62/9476    Chelsea Peterson DPT Sharion Settler, SPT  Sharion Settler 12/05/2020, 9:33 AM  [place] [address] [place], [address] Phone: [phone]   Fax:  [phone]  Name: LORON WEIMER MRN: [id number] Date of Birth: 12-20-1955

## 2020-12-08 ENCOUNTER — Encounter: Payer: Self-pay | Admitting: Physical Therapy

## 2020-12-08 ENCOUNTER — Ambulatory Visit: Payer: Medicare HMO | Admitting: Physical Therapy

## 2020-12-08 VITALS — BP 148/94 | HR 70

## 2020-12-08 DIAGNOSIS — G8929 Other chronic pain: Secondary | ICD-10-CM

## 2020-12-08 DIAGNOSIS — M25512 Pain in left shoulder: Secondary | ICD-10-CM | POA: Diagnosis not present

## 2020-12-08 NOTE — Therapy (Signed)
[facility] [address], [place], [address] Phone: [phone]   Fax:  [phone]  Physical Therapy Treatment  Patient Details  Name: Daniel [name] MRN: [id number] Date of Birth: Jan 19, 1956 Referring Provider (PT): Dr. Manuella Ghazi   Encounter Date: 12/08/2020   PT End of Session - 12/08/20 0916     Visit Number 6    Number of Visits 17    Date for PT Re-Evaluation 12/13/20    PT Start Time 0815    PT Stop Time 0900    PT Time Calculation (min) 45 min    Equipment Utilized During Treatment Gait belt    Activity Tolerance Patient tolerated treatment well    Behavior During Therapy [facility] for tasks assessed/performed             Past Medical History:  Diagnosis Date   Diabetes mellitus without complication ([place])    Hypertension    Pancreatitis    Seizures ([place])    Stroke ([place])    Stroke ([place]) 2016    Past Surgical History:  Procedure Laterality Date   COLONOSCOPY WITH PROPOFOL N/A 10/07/2019   Procedure: COLONOSCOPY WITH PROPOFOL;  Surgeon: Toledo, Benay Pike, MD;  Location: [facility] ENDOSCOPY;  Service: Gastroenterology;  Laterality: N/A;   ESOPHAGOGASTRODUODENOSCOPY     ESOPHAGOGASTRODUODENOSCOPY (EGD) WITH PROPOFOL N/A 10/07/2019   Procedure: ESOPHAGOGASTRODUODENOSCOPY (EGD) WITH PROPOFOL;  Surgeon: Toledo, Benay Pike, MD;  Location: [facility] ENDOSCOPY;  Service: Gastroenterology;  Laterality: N/A;   TEE WITHOUT CARDIOVERSION N/A 11/01/2014   Procedure: TRANSESOPHAGEAL ECHOCARDIOGRAM (TEE);  Surgeon: Wellington Hampshire, MD;  Location: [facility] ORS;  Service: Cardiovascular;  Laterality: N/A;    Vitals:   12/08/20 0820  BP: (!) 148/94  Pulse: 70     Subjective Assessment - 12/08/20 0818     Subjective Pt denies having any pain this session. He also denies having any change in medication or having the opportunity to get a SPC.    Pertinent History Patient is a pleasant 65 year old male referred by [employer],  for a wheelchair evaluation. Patient experienced a CVA with left sided hemiparesis in 2016. An MRI confirmed Right MCA on 10/27/2014. Patient presents to clinic today stating he a non-functional Left UE, altered sensation on left side of body with difficulty with all mobility and dependent on wife to assist with all ADLs. He states he does not have any type of wheeled mobility devices other than a front wheeled walker and states he has a cane as well but unable to use due to poor balance and increased risk of falling. His past medical history includes history of seizure, memory loss following CVA in 2016, Depression, and Diabetes. Patient reports unable to use a walker due to poor overall Left UE functional use and unsteady with a cane in home with 3 reported falls in past 6 months.    Limitations Standing;Walking;House hold activities;Lifting    How long can you sit comfortably? no limitations    How long can you stand comfortably? Unsteady with all standing and mobility    How long can you walk comfortably? unsteady with all mobility- increased risk of falling.              Therapeutic Exercise   AAROM Pulleys flexion/scaption 3 x 90 sec each direction * cues to relax L shoulder and let the R bring it as far as possible *Therapist aided in holding L LE in scaption  Seated Lat stretch  with L UE on table 4 x 30 sec cueing for patient to lean forward   UE Ranger walking L shoulder into flexion 2 x 10 reps with 3-5 sec hold heavy cueing to keep elbow straight   Supine AAROM with PVC flex 2 x 90 sec  Supine Horizontal Abduction (pec) stretch with PVC 3 x 30 sec   L Upper trap stretch with R cervical sidebending 3 x 30 sec   Manual Therapy   STM to L Pec major to increase L shoulder ABD/Flex    *constant cues to relax chest and shoulder for PROM and STM        PT Education - 12/08/20 1059     Education provided Yes    Education Details cane use, therex form/technique     Person(s) Educated Patient    Methods Explanation    Comprehension Verbalized understanding              PT Short Term Goals - 11/15/20 1321       PT SHORT TERM GOAL #1   Title Pt will demonstrate independence with HEP to improve UE function for increased ability to participate with ADLs    Baseline Pt currently does not have a HEP.    Time 4    Period Weeks    Status New    Target Date 12/13/20               PT Long Term Goals - 11/15/20 1326       PT LONG TERM GOAL #1   Title Pt will demonstrate 90 degrees of active shoulder flexion & abduction ROM with L UE in order to reach objects at shoulder level and decrease burden on caregiver.    Baseline 11/15/20 Pt approximate L shoulder AROM is currently 30 deg flexion and 20 deg abduction    Time 12    Period Weeks    Status New    Target Date 02/07/21      PT LONG TERM GOAL #2   Title Pt will demostrate 3+/5 strength within available L UE ROM in order to perform ADLs to decrease burden on caregiver.    Baseline 11/15/20 Pt current strength grossly 2/5 L UE in availible range    Time 12    Period Weeks    Status New      PT LONG TERM GOAL #3   Title Pt will improve FOTO score to 63 in order to demostrate improvement functional mobilty and quality of life.    Baseline 11/15/20 40    Time 12    Period Weeks    Status New      PT LONG TERM GOAL #4   Title Pt will decrease worst pain as reported on NPRS by at least 3 points in order to demonstrate clinically significant reduction in pain.    Baseline 11/15/20 9/10    Time 12    Period Weeks    Status New                   Plan - 12/08/20 0917     Clinical Impression Statement Pt tolerated session well evidenced by no reports of increased L shoulder pain following manual therapy and exercise. Pt continues to demonstrate decreased L shoulder ROM, pec major hypertonicity,and pain. Pt requires multimodal cueing to perform tasks and ensure proper form. PT  focused on therex, manual techniques, and stretching this session to improve L shoulder flexion and abduction.  Personal Factors and Comorbidities Time since onset of injury/illness/exacerbation;Comorbidity 3+;Education    Comorbidities syncope, seizure, Diabetes    Examination-Activity Limitations Bathing;Bed Mobility;Bend;Carry;Hygiene/Grooming;Lift;Locomotion Level;Reach Overhead;Squat;Stairs;Stand;Toileting;Transfers    Examination-Participation Restrictions Cleaning;Community Activity;Driving;Yard Work;Meal Prep;Medication Management;Shop;Laundry    Stability/Clinical Decision Making Evolving/Moderate complexity    Rehab Potential Fair    PT Frequency 2x / week    PT Duration 12 weeks    PT Treatment/Interventions ADLs/Self Care Home Management;Therapeutic exercise;Therapeutic activities;Functional mobility training;Stair training;Gait training;DME Instruction;Balance training;Neuromuscular re-education;Manual techniques;Splinting;Passive range of motion;Visual/perceptual remediation/compensation;Patient/family education;Orthotic Fit/Training;Wheelchair mobility training;Dry needling;Moist Heat;Cryotherapy;Traction;Electrical Stimulation    PT Next Visit Plan outcome measures, manual    PT Home Exercise Plan AAROM flex abd    Consulted and Agree with Plan of Care Patient             Patient will benefit from skilled therapeutic intervention in order to improve the following deficits and impairments:  Abnormal gait, Impaired tone, Decreased balance, Decreased mobility, Decreased endurance, Difficulty walking, Impaired sensation, Decreased range of motion, Improper body mechanics, Impaired UE functional use, Impaired flexibility, Decreased strength, Decreased coordination, Decreased activity tolerance, Increased fascial restricitons  Visit Diagnosis: Chronic left shoulder pain     Problem List Patient Active Problem List   Diagnosis Date Noted   Syncope 01/02/2020   Syncope,  vasovagal 01/01/2020   Closed [place] II fracture ([place]) 01/01/2020   History of CVA (cerebrovascular accident) 01/01/2020   CKD (chronic kidney disease), stage II    Recurrent seizures ([place]) 07/13/2019   Seizure disorder ([place]) 07/12/2019   HLD (hyperlipidemia) 07/12/2019   Type 2 diabetes mellitus with hyperlipidemia ([place]) 07/12/2019   CKD (chronic kidney disease), stage IIIb 07/12/2019   AKI (acute kidney injury) ([place]) 06/13/2019   Hyperkalemia 06/13/2019   Obesity (BMI 30-39.9) 06/13/2019   Depression 06/13/2019   BPH (benign prostatic hyperplasia) 06/13/2019   Peripheral neuropathy 06/13/2019   DKA (diabetic ketoacidoses) 35/70/1779   Acute metabolic encephalopathy 39/07/90   Stroke ([place]) 10/27/2014   Lymphadenitis 04/09/2011   HTN (hypertension) 04/09/2011   Uncontrolled diabetes mellitus with complications ([place]) 33/00/7622    Chelsea Peterson DPT Sharion Settler, SPT  Durwin Reges 12/08/2020, 11:08 AM  [facility] [address] [place], [address] Phone: [phone]   Fax:  [phone]  Name: Daniel [name] MRN: [id number] Date of Birth: 1956-05-22

## 2020-12-12 ENCOUNTER — Ambulatory Visit: Payer: Medicare HMO | Admitting: Physical Therapy

## 2020-12-14 ENCOUNTER — Encounter: Payer: Self-pay | Admitting: Physical Therapy

## 2020-12-14 ENCOUNTER — Ambulatory Visit: Payer: Medicare HMO | Admitting: Physical Therapy

## 2020-12-14 DIAGNOSIS — G8929 Other chronic pain: Secondary | ICD-10-CM | POA: Diagnosis not present

## 2020-12-14 DIAGNOSIS — M25512 Pain in left shoulder: Secondary | ICD-10-CM

## 2020-12-14 NOTE — Therapy (Addendum)
[facility] [address], [place], [address] Phone: [phone]   Fax:  [phone]  Physical Therapy Treatment  Patient Details  Name: Daniel [name] MRN: [id number] Date of Birth: 04/09/1956 Referring Provider (PT): Dr. Manuella Ghazi   Encounter Date: 12/14/2020   PT End of Session - 12/14/20 1555     Visit Number 7    Number of Visits 17    Date for PT Re-Evaluation 12/13/20    Authorization - Visit Number 7    Authorization - Number of Visits 10    PT Start Time 8850    PT Stop Time 2774    PT Time Calculation (min) 38 min    Equipment Utilized During Treatment Gait belt    Activity Tolerance Patient tolerated treatment well    Behavior During Therapy [facility] for tasks assessed/performed             Past Medical History:  Diagnosis Date   Diabetes mellitus without complication ([place])    Hypertension    Pancreatitis    Seizures ([place])    Stroke ([place])    Stroke ([place]) 2016    Past Surgical History:  Procedure Laterality Date   COLONOSCOPY WITH PROPOFOL N/A 10/07/2019   Procedure: COLONOSCOPY WITH PROPOFOL;  Surgeon: Toledo, Benay Pike, MD;  Location: [facility] ENDOSCOPY;  Service: Gastroenterology;  Laterality: N/A;   ESOPHAGOGASTRODUODENOSCOPY     ESOPHAGOGASTRODUODENOSCOPY (EGD) WITH PROPOFOL N/A 10/07/2019   Procedure: ESOPHAGOGASTRODUODENOSCOPY (EGD) WITH PROPOFOL;  Surgeon: Toledo, Benay Pike, MD;  Location: [facility];  Service: Gastroenterology;  Laterality: N/A;   TEE WITHOUT CARDIOVERSION N/A 11/01/2014   Procedure: TRANSESOPHAGEAL ECHOCARDIOGRAM (TEE);  Surgeon: Wellington Hampshire, MD;  Location: [facility] ORS;  Service: Cardiovascular;  Laterality: N/A;    There were no vitals filed for this visit.   Subjective Assessment - 12/14/20 1546     Subjective Pt reports he missed tuesday d/t a small seizure. He reports he has these 1x/month and they go away quickly.    Pertinent History Patient is a pleasant  65 year old male referred by [employer], for a wheelchair evaluation. Patient experienced a CVA with left sided hemiparesis in 2016. An MRI confirmed Right MCA on 10/27/2014. Patient presents to clinic today stating he a non-functional Left UE, altered sensation on left side of body with difficulty with all mobility and dependent on wife to assist with all ADLs. He states he does not have any type of wheeled mobility devices other than a front wheeled walker and states he has a cane as well but unable to use due to poor balance and increased risk of falling. His past medical history includes history of seizure, memory loss following CVA in 2016, Depression, and Diabetes. Patient reports unable to use a walker due to poor overall Left UE functional use and unsteady with a cane in home with 3 reported falls in past 6 months.    Limitations Standing;Walking;House hold activities;Lifting    How long can you sit comfortably? no limitations    How long can you stand comfortably? Unsteady with all standing and mobility    How long can you walk comfortably? unsteady with all mobility- increased risk of falling.    Patient Stated Goals To increase functional mobility of L UE.    Pain Onset More than a month ago               Therapeutic Exercise   AAROM Pulleys flexion/scaption 3 x  90 sec each direction * cues to relax L shoulder and let the R bring it as far as possible *Therapist aided in holding L LE in scaption   Fwd BUE flex onto large blue theraball 2x 12 3sec hold min cuing for hold time In above position stretch x30sec hold   Supine AAROM with PVC flex X12 3sec hold with 1/2 foam roll at inf scapulae border for increased thoracic ext; same position stretch in max overhead flex 105d 30sec hold  Supine pec stretch with towel roll along spine 75min (unable to supinate and fully ext elbow) In above position: aarom wand abd x12 3sec hold with min cuing for max available elbow ext   Laying on R  side open books with therapist aiding in maintaining L knee (hooked) contact to mat table, and with LUE opening with use of PVC pipe x12  Seated aarom wand flex with 1/2 foam for increased thoracic ext x12 with min cuing for eccentric control with good carry over                              PT Education - 12/14/20 1555     Education provided Yes    Education Details therex form/technique    Person(s) Educated Patient    Methods Explanation;Demonstration;Verbal cues    Comprehension Verbalized understanding;Returned demonstration;Verbal cues required              PT Short Term Goals - 11/15/20 1321       PT SHORT TERM GOAL #1   Title Pt will demonstrate independence with HEP to improve UE function for increased ability to participate with ADLs    Baseline Pt currently does not have a HEP.    Time 4    Period Weeks    Status New    Target Date 12/13/20               PT Long Term Goals - 11/15/20 1326       PT LONG TERM GOAL #1   Title Pt will demonstrate 90 degrees of active shoulder flexion & abduction ROM with L UE in order to reach objects at shoulder level and decrease burden on caregiver.    Baseline 11/15/20 Pt approximate L shoulder AROM is currently 30 deg flexion and 20 deg abduction    Time 12    Period Weeks    Status New    Target Date 02/07/21      PT LONG TERM GOAL #2   Title Pt will demostrate 3+/5 strength within available L UE ROM in order to perform ADLs to decrease burden on caregiver.    Baseline 11/15/20 Pt current strength grossly 2/5 L UE in availible range    Time 12    Period Weeks    Status New      PT LONG TERM GOAL #3   Title Pt will improve FOTO score to 63 in order to demostrate improvement functional mobilty and quality of life.    Baseline 11/15/20 40    Time 12    Period Weeks    Status New      PT LONG TERM GOAL #4   Title Pt will decrease worst pain as reported on NPRS by at least 3 points in order to  demonstrate clinically significant reduction in pain.    Baseline 11/15/20 9/10    Time 12    Period Weeks    Status New  Plan - 12/14/20 1602     Clinical Impression Statement PT continued therex progression for increased shoulder and thoracic mobility and pain reduction with success. Patient is able to comply with cuing for proper technique of therex, with good motivation throughout session. Max flex obtained 105d with AAROM in supine. Pt reports no pain throughout session, and that he feels more mobile following session. Patient with difficulty with combined elbow ext/forearm supination d/t tone. PT will continue therex progression for functional mobility as able.    Personal Factors and Comorbidities Time since onset of injury/illness/exacerbation;Comorbidity 3+;Education    Comorbidities syncope, seizure, Diabetes    Examination-Activity Limitations Bathing;Bed Mobility;Bend;Carry;Hygiene/Grooming;Lift;Locomotion Level;Reach Overhead;Squat;Stairs;Stand;Toileting;Transfers    Examination-Participation Restrictions Cleaning;Community Activity;Driving;Yard Work;Meal Prep;Medication Management;Shop;Laundry    Stability/Clinical Decision Making Evolving/Moderate complexity    Clinical Decision Making Moderate    Rehab Potential Fair    PT Frequency 2x / week    PT Treatment/Interventions ADLs/Self Care Home Management;Therapeutic exercise;Therapeutic activities;Functional mobility training;Stair training;Gait training;DME Instruction;Balance training;Neuromuscular re-education;Manual techniques;Splinting;Passive range of motion;Visual/perceptual remediation/compensation;Patient/family education;Orthotic Fit/Training;Wheelchair mobility training;Dry needling;Moist Heat;Cryotherapy;Traction;Electrical Stimulation    PT Next Visit Plan outcome measures, manual    PT Home Exercise Plan AAROM flex abd    Consulted and Agree with Plan of Care Patient    Family Member  Consulted [name]- wife             Patient will benefit from skilled therapeutic intervention in order to improve the following deficits and impairments:  Abnormal gait, Impaired tone, Decreased balance, Decreased mobility, Decreased endurance, Difficulty walking, Impaired sensation, Decreased range of motion, Improper body mechanics, Impaired UE functional use, Impaired flexibility, Decreased strength, Decreased coordination, Decreased activity tolerance, Increased fascial restricitons  Visit Diagnosis: Chronic left shoulder pain     Problem List Patient Active Problem List   Diagnosis Date Noted   Syncope 01/02/2020   Syncope, vasovagal 01/01/2020   Closed [place] II fracture ([place]) 01/01/2020   History of CVA (cerebrovascular accident) 01/01/2020   CKD (chronic kidney disease), stage II    Recurrent seizures ([place]) 07/13/2019   Seizure disorder ([place]) 07/12/2019   HLD (hyperlipidemia) 07/12/2019   Type 2 diabetes mellitus with hyperlipidemia ([place]) 07/12/2019   CKD (chronic kidney disease), stage IIIb 07/12/2019   AKI (acute kidney injury) ([place]) 06/13/2019   Hyperkalemia 06/13/2019   Obesity (BMI 30-39.9) 06/13/2019   Depression 06/13/2019   BPH (benign prostatic hyperplasia) 06/13/2019   Peripheral neuropathy 06/13/2019   DKA (diabetic ketoacidoses) 93/73/4287   Acute metabolic encephalopathy 68/03/5725   Stroke ([place]) 10/27/2014   Lymphadenitis 04/09/2011   HTN (hypertension) 04/09/2011   Uncontrolled diabetes mellitus with complications ([place]) 20/35/5974   Alicha Raspberry DPT Durwin Reges 12/14/2020, 4:22 PM  [facility] [address] [place], [address] Phone: [phone]   Fax:  [phone]  Name: Daniel [name] MRN: [id number] Date of Birth: Aug 04, 1955

## 2020-12-20 ENCOUNTER — Encounter: Payer: Self-pay | Admitting: Physical Therapy

## 2020-12-20 ENCOUNTER — Ambulatory Visit: Payer: Medicare HMO

## 2020-12-20 DIAGNOSIS — G8929 Other chronic pain: Secondary | ICD-10-CM | POA: Diagnosis not present

## 2020-12-20 DIAGNOSIS — M25512 Pain in left shoulder: Secondary | ICD-10-CM | POA: Diagnosis not present

## 2020-12-20 NOTE — Therapy (Signed)
[facility] [address] [place], [address] Phone: [phone]   Fax:  [phone]  Physical Therapy Treatment  Patient Details  Name: Daniel [name] MRN: [id number] Date of Birth: 1956/01/16 Referring Provider (PT): Dr. Manuella Ghazi   Encounter Date: 12/20/2020   PT End of Session - 12/20/20 1623     Visit Number 8    Number of Visits 17    Date for PT Re-Evaluation 01/03/21    Authorization Type 4/10    Authorization - Visit Number 8    Authorization - Number of Visits 10    PT Start Time 1771    PT Stop Time 1622    PT Time Calculation (min) 44 min    Equipment Utilized During Treatment Gait belt    Activity Tolerance Patient tolerated treatment well    Behavior During Therapy [facility] for tasks assessed/performed             Past Medical History:  Diagnosis Date   Diabetes mellitus without complication ([place])    Hypertension    Pancreatitis    Seizures ([place])    Stroke ([place])    Stroke ([place]) 2016    Past Surgical History:  Procedure Laterality Date   COLONOSCOPY WITH PROPOFOL N/A 10/07/2019   Procedure: COLONOSCOPY WITH PROPOFOL;  Surgeon: Toledo, Benay Pike, MD;  Location: [facility] ENDOSCOPY;  Service: Gastroenterology;  Laterality: N/A;   ESOPHAGOGASTRODUODENOSCOPY     ESOPHAGOGASTRODUODENOSCOPY (EGD) WITH PROPOFOL N/A 10/07/2019   Procedure: ESOPHAGOGASTRODUODENOSCOPY (EGD) WITH PROPOFOL;  Surgeon: Toledo, Benay Pike, MD;  Location: [facility] ENDOSCOPY;  Service: Gastroenterology;  Laterality: N/A;   TEE WITHOUT CARDIOVERSION N/A 11/01/2014   Procedure: TRANSESOPHAGEAL ECHOCARDIOGRAM (TEE);  Surgeon: Wellington Hampshire, MD;  Location: [facility] ORS;  Service: Cardiovascular;  Laterality: N/A;    There were no vitals filed for this visit.   Subjective Assessment - 12/20/20 1541     Subjective Pt reports to therapy feeling well with no negatives affects from seizure. He denies any pain in L shoulder today.     Pertinent History Patient is a pleasant 65 year old male referred by [employer], for a wheelchair evaluation. Patient experienced a CVA with left sided hemiparesis in 2016. An MRI confirmed Right MCA on 10/27/2014. Patient presents to clinic today stating he a non-functional Left UE, altered sensation on left side of body with difficulty with all mobility and dependent on wife to assist with all ADLs. He states he does not have any type of wheeled mobility devices other than a front wheeled walker and states he has a cane as well but unable to use due to poor balance and increased risk of falling. His past medical history includes history of seizure, memory loss following CVA in 2016, Depression, and Diabetes. Patient reports unable to use a walker due to poor overall Left UE functional use and unsteady with a cane in home with 3 reported falls in past 6 months.    Limitations Standing;Walking;House hold activities;Lifting    How long can you sit comfortably? no limitations    How long can you stand comfortably? Unsteady with all standing and mobility    How long can you walk comfortably? unsteady with all mobility- increased risk of falling.    Patient Stated Goals To increase functional mobility of L UE.            Therex  AAROM Pulleys flexion/scaption 3 x 90 sec each direction * cues to relax L  shoulder and let the R bring it as far as possible *Therapist aided in holding L LE in scaption    L UE arm circles 3 x 30 sec in supine with multimodal cuing to decrease speed and optimize available range of motion  Supine AAROM with PVC flex 5 x 30 sec holdwith therapist assist to hold   Supine Horizontal Abduction (pec) stretch with PVC 5 x 30 sec with therapist assist to hold   L Upper trap stretch with R cervical sidebending 3 x 30 sec  L shoulder ER/IR Rotation laying on half foam roller to promote thoracic extension  Scap retractions 2 x 10 reps with 3 sec holds   MET to L shoulder  Horizontal ADD 8 x 5 sec hold and stretch   Intermittent STM provided to L Pec major and R trapezius during stretches and L shoulder mobility in hopes to increase L shoulder ABD/Flex AROM by decreasing tone in tissues.        *constant multimodal cues to relax chest and shoulder for PROM and STM                         PT Education - 12/20/20 1622     Education provided Yes    Education Details Pt educated on HEP as well as therex form/technique    Person(s) Educated Patient    Methods Explanation;Demonstration    Comprehension Verbalized understanding;Returned demonstration              PT Short Term Goals - 11/15/20 1321       PT SHORT TERM GOAL #1   Title Pt will demonstrate independence with HEP to improve UE function for increased ability to participate with ADLs    Baseline Pt currently does not have a HEP.    Time 4    Period Weeks    Status New    Target Date 12/13/20               PT Long Term Goals - 11/15/20 1326       PT LONG TERM GOAL #1   Title Pt will demonstrate 90 degrees of active shoulder flexion & abduction ROM with L UE in order to reach objects at shoulder level and decrease burden on caregiver.    Baseline 11/15/20 Pt approximate L shoulder AROM is currently 30 deg flexion and 20 deg abduction    Time 12    Period Weeks    Status New    Target Date 02/07/21      PT LONG TERM GOAL #2   Title Pt will demostrate 3+/5 strength within available L UE ROM in order to perform ADLs to decrease burden on caregiver.    Baseline 11/15/20 Pt current strength grossly 2/5 L UE in availible range    Time 12    Period Weeks    Status New      PT LONG TERM GOAL #3   Title Pt will improve FOTO score to 63 in order to demostrate improvement functional mobilty and quality of life.    Baseline 11/15/20 40    Time 12    Period Weeks    Status New      PT LONG TERM GOAL #4   Title Pt will decrease worst pain as reported on NPRS by  at least 3 points in order to demonstrate clinically significant reduction in pain.    Baseline 11/15/20 9/10    Time 12  Period Weeks    Status New                   Plan - 12/20/20 1626     Clinical Impression Statement Pt tolerated session well evidenced by no increase in pain at end of session. PT continued L shoulder and thoracic mobility progression utilizing multimodal cueing to achieve desired therapeutic effect with success. Pt continues to decreased L shoulder flexibility and will continue to benefit from skilled PT to achieve set goals. Future sessions to focus on patient and spousal education to assist with HEP for optimal outcomes.    Personal Factors and Comorbidities Time since onset of injury/illness/exacerbation;Comorbidity 3+;Education    Comorbidities syncope, seizure, Diabetes    Examination-Activity Limitations Bathing;Bed Mobility;Bend;Carry;Hygiene/Grooming;Lift;Locomotion Level;Reach Overhead;Squat;Stairs;Stand;Toileting;Transfers    Examination-Participation Restrictions Cleaning;Community Activity;Driving;Yard Work;Meal Prep;Medication Management;Shop;Laundry    Stability/Clinical Decision Making Evolving/Moderate complexity    Rehab Potential Fair    PT Frequency 2x / week    PT Duration 12 weeks    PT Treatment/Interventions ADLs/Self Care Home Management;Therapeutic exercise;Therapeutic activities;Functional mobility training;Stair training;Gait training;DME Instruction;Balance training;Neuromuscular re-education;Manual techniques;Splinting;Passive range of motion;Visual/perceptual remediation/compensation;Patient/family education;Orthotic Fit/Training;Wheelchair mobility training;Dry needling;Moist Heat;Cryotherapy;Traction;Electrical Stimulation    PT Next Visit Plan outcome measures, manual    PT Home Exercise Plan AAROM flex abd    Consulted and Agree with Plan of Care Patient    Family Member Consulted [name]- wife             Patient will  benefit from skilled therapeutic intervention in order to improve the following deficits and impairments:  Abnormal gait, Impaired tone, Decreased balance, Decreased mobility, Decreased endurance, Difficulty walking, Impaired sensation, Decreased range of motion, Improper body mechanics, Impaired UE functional use, Impaired flexibility, Decreased strength, Decreased coordination, Decreased activity tolerance, Increased fascial restricitons  Visit Diagnosis: Chronic left shoulder pain     Problem List Patient Active Problem List   Diagnosis Date Noted   Syncope 01/02/2020   Syncope, vasovagal 01/01/2020   Closed [place] II fracture ([place]) 01/01/2020   History of CVA (cerebrovascular accident) 01/01/2020   CKD (chronic kidney disease), stage II    Recurrent seizures ([place]) 07/13/2019   Seizure disorder ([place]) 07/12/2019   HLD (hyperlipidemia) 07/12/2019   Type 2 diabetes mellitus with hyperlipidemia ([place]) 07/12/2019   CKD (chronic kidney disease), stage IIIb 07/12/2019   AKI (acute kidney injury) ([place]) 06/13/2019   Hyperkalemia 06/13/2019   Obesity (BMI 30-39.9) 06/13/2019   Depression 06/13/2019   BPH (benign prostatic hyperplasia) 06/13/2019   Peripheral neuropathy 06/13/2019   DKA (diabetic ketoacidoses) 71/69/6789   Acute metabolic encephalopathy 38/02/1750   Stroke ([place]) 10/27/2014   Lymphadenitis 04/09/2011   HTN (hypertension) 04/09/2011   Uncontrolled diabetes mellitus with complications ([place]) 02/58/5277   Sharion Settler, SPT   Salem Caster. Fairly IV, PT, DPT Physical Therapist- [facility]  12/20/2020, 4:36 PM  [facility] [address], [place], [address] Phone: [phone]   Fax:  [phone]  Name: Daniel [name] MRN: [id number] Date of Birth: 1955/10/23

## 2020-12-22 ENCOUNTER — Ambulatory Visit: Payer: Medicare HMO | Admitting: Physical Therapy

## 2020-12-27 ENCOUNTER — Ambulatory Visit: Payer: Medicare HMO | Attending: Sports Medicine | Admitting: Physical Therapy

## 2020-12-27 ENCOUNTER — Encounter: Payer: Self-pay | Admitting: Physical Therapy

## 2020-12-27 DIAGNOSIS — M25512 Pain in left shoulder: Secondary | ICD-10-CM | POA: Diagnosis not present

## 2020-12-27 DIAGNOSIS — G8929 Other chronic pain: Secondary | ICD-10-CM | POA: Insufficient documentation

## 2020-12-27 NOTE — Therapy (Addendum)
[facility] [address], [place], [address] Phone: [phone]   Fax:  [phone]  Physical Therapy Treatment  Patient Details  Name: Daniel [name] MRN: [id number] Date of Birth: 05/08/1956 Referring Provider (PT): Dr. Manuella Ghazi   Encounter Date: 12/27/2020   PT End of Session - 12/27/20 1159     Visit Number 9    Number of Visits 17    Date for PT Re-Evaluation 01/03/21    Authorization - Visit Number 9    Authorization - Number of Visits 10    PT Start Time 8299    PT Stop Time 3716    PT Time Calculation (min) 40 min    Activity Tolerance Patient tolerated treatment well    Behavior During Therapy [facility] for tasks assessed/performed             Past Medical History:  Diagnosis Date   Diabetes mellitus without complication ([place])    Hypertension    Pancreatitis    Seizures ([place])    Stroke ([place])    Stroke ([place]) 2016    Past Surgical History:  Procedure Laterality Date   COLONOSCOPY WITH PROPOFOL N/A 10/07/2019   Procedure: COLONOSCOPY WITH PROPOFOL;  Surgeon: Toledo, Benay Pike, MD;  Location: [facility] ENDOSCOPY;  Service: Gastroenterology;  Laterality: N/A;   ESOPHAGOGASTRODUODENOSCOPY     ESOPHAGOGASTRODUODENOSCOPY (EGD) WITH PROPOFOL N/A 10/07/2019   Procedure: ESOPHAGOGASTRODUODENOSCOPY (EGD) WITH PROPOFOL;  Surgeon: Toledo, Benay Pike, MD;  Location: [facility] ENDOSCOPY;  Service: Gastroenterology;  Laterality: N/A;   TEE WITHOUT CARDIOVERSION N/A 11/01/2014   Procedure: TRANSESOPHAGEAL ECHOCARDIOGRAM (TEE);  Surgeon: Wellington Hampshire, MD;  Location: [facility] ORS;  Service: Cardiovascular;  Laterality: N/A;    There were no vitals filed for this visit.   Subjective Assessment - 12/27/20 1120     Subjective Pt reports falling after tripping on a ramp at the laundramat. He reports falling on L UE and having some soreness and pain rating 5/10 on NPRS. He denies having any serious injury. Pt educated on using  handrails and looking out for uneven curbs.    Patient is accompained by: Family member    Pertinent History Patient is a pleasant 65 year old male referred by [employer], for a wheelchair evaluation. Patient experienced a CVA with left sided hemiparesis in 2016. An MRI confirmed Right MCA on 10/27/2014. Patient presents to clinic today stating he a non-functional Left UE, altered sensation on left side of body with difficulty with all mobility and dependent on wife to assist with all ADLs. He states he does not have any type of wheeled mobility devices other than a front wheeled walker and states he has a cane as well but unable to use due to poor balance and increased risk of falling. His past medical history includes history of seizure, memory loss following CVA in 2016, Depression, and Diabetes. Patient reports unable to use a walker due to poor overall Left UE functional use and unsteady with a cane in home with 3 reported falls in past 6 months.    Limitations Standing;Walking;House hold activities;Lifting    How long can you sit comfortably? no limitations    How long can you stand comfortably? Unsteady with all standing and mobility    How long can you walk comfortably? unsteady with all mobility- increased risk of falling.    Patient Stated Goals To increase functional mobility of L UE.  Therex   AAROM Pulleys flexion/scaption 3 x 90 sec each direction * cues to relax L shoulder and let the R bring it as far as possible *Therapist aided in holding L LE in scaption    L UE arm circles 3 x 30 sec in supine with multimodal cuing to decrease speed and optimize available range of motion   Supine AAROM with PVC flex 5 x 30 sec hold with therapist assist to hold   Supine Horizontal Abduction (pec) stretch with PVC 5 x 30 sec with therapist assist to hold laying on half foam roll to improve pec major muscle length   L Upper trap stretch with R cervical sidebending 3 x  30 sec   L shoulder ER/IR Rotation laying on half foam roller to promote thoracic mobility and decrease rounded posture   Manual Therapy   MET to L shoulder Horizontal ADD 8 x 5 sec hold and stretch  MET to L shoulder ER 8 x 5 sec hold and stretch     Intermittent STM provided to L Pec major and R trapezius during stretches and L shoulder mobility in hopes to increase L shoulder ABD/Flex AROM by decreasing tone in tissues.  Scapular mobilizations in elevation, depression, protraction, and retraction.           *constant multimodal cues to relax chest and shoulder for PROM and STM                      PT Education - 12/27/20 1338     Education provided Yes    Education Details fall prevention, therex form/technique    Person(s) Educated Patient    Methods Explanation;Demonstration;Verbal cues    Comprehension Verbal cues required;Returned demonstration;Verbalized understanding              PT Short Term Goals - 11/15/20 1321       PT SHORT TERM GOAL #1   Title Pt will demonstrate independence with HEP to improve UE function for increased ability to participate with ADLs    Baseline Pt currently does not have a HEP.    Time 4    Period Weeks    Status New    Target Date 12/13/20               PT Long Term Goals - 11/15/20 1326       PT LONG TERM GOAL #1   Title Pt will demonstrate 90 degrees of active shoulder flexion & abduction ROM with L UE in order to reach objects at shoulder level and decrease burden on caregiver.    Baseline 11/15/20 Pt approximate L shoulder AROM is currently 30 deg flexion and 20 deg abduction    Time 12    Period Weeks    Status New    Target Date 02/07/21      PT LONG TERM GOAL #2   Title Pt will demostrate 3+/5 strength within available L UE ROM in order to perform ADLs to decrease burden on caregiver.    Baseline 11/15/20 Pt current strength grossly 2/5 L UE in availible range    Time 12    Period Weeks     Status New      PT LONG TERM GOAL #3   Title Pt will improve FOTO score to 63 in order to demostrate improvement functional mobilty and quality of life.    Baseline 11/15/20 40    Time 12    Period Weeks  Status New      PT LONG TERM GOAL #4   Title Pt will decrease worst pain as reported on NPRS by at least 3 points in order to demonstrate clinically significant reduction in pain.    Baseline 11/15/20 9/10    Time 12    Period Weeks    Status New                   Plan - 12/27/20 1159     Clinical Impression Statement Pt tolerated session well evidence by no reports of increased pain following manual therapy and exercise. PT continued L shoulder and thoracic mobility progression with multimodal cueing to reduce guarding and improve technique. Pt continues to display L UE hypertonicity and will continue to benefit from skilled therapy to achieve set goals.    Personal Factors and Comorbidities Time since onset of injury/illness/exacerbation;Comorbidity 3+;Education    Comorbidities syncope, seizure, Diabetes    Examination-Activity Limitations Bathing;Bed Mobility;Bend;Carry;Hygiene/Grooming;Lift;Locomotion Level;Reach Overhead;Squat;Stairs;Stand;Toileting;Transfers    Examination-Participation Restrictions Cleaning;Community Activity;Driving;Yard Work;Meal Prep;Medication Management;Shop;Laundry    Stability/Clinical Decision Making Evolving/Moderate complexity    Clinical Decision Making Moderate    Rehab Potential Fair    PT Frequency 2x / week    PT Duration 12 weeks    PT Treatment/Interventions ADLs/Self Care Home Management;Therapeutic exercise;Therapeutic activities;Functional mobility training;Stair training;Gait training;DME Instruction;Balance training;Neuromuscular re-education;Manual techniques;Splinting;Passive range of motion;Visual/perceptual remediation/compensation;Patient/family education;Orthotic Fit/Training;Wheelchair mobility training;Dry  needling;Moist Heat;Cryotherapy;Traction;Electrical Stimulation    PT Next Visit Plan outcome measures, manual    PT Home Exercise Plan AAROM flex abd    Consulted and Agree with Plan of Care Patient             Patient will benefit from skilled therapeutic intervention in order to improve the following deficits and impairments:  Abnormal gait, Impaired tone, Decreased balance, Decreased mobility, Decreased endurance, Difficulty walking, Impaired sensation, Decreased range of motion, Improper body mechanics, Impaired UE functional use, Impaired flexibility, Decreased strength, Decreased coordination, Decreased activity tolerance, Increased fascial restricitons  Visit Diagnosis: Chronic left shoulder pain     Problem List Patient Active Problem List   Diagnosis Date Noted   Syncope 01/02/2020   Syncope, vasovagal 01/01/2020   Closed [place] II fracture ([place]) 01/01/2020   History of CVA (cerebrovascular accident) 01/01/2020   CKD (chronic kidney disease), stage II    Recurrent seizures ([place]) 07/13/2019   Seizure disorder ([place]) 07/12/2019   HLD (hyperlipidemia) 07/12/2019   Type 2 diabetes mellitus with hyperlipidemia ([place]) 07/12/2019   CKD (chronic kidney disease), stage IIIb 07/12/2019   AKI (acute kidney injury) ([place]) 06/13/2019   Hyperkalemia 06/13/2019   Obesity (BMI 30-39.9) 06/13/2019   Depression 06/13/2019   BPH (benign prostatic hyperplasia) 06/13/2019   Peripheral neuropathy 06/13/2019   DKA (diabetic ketoacidoses) 09/60/4540   Acute metabolic encephalopathy 98/03/9146   Stroke ([place]) 10/27/2014   Lymphadenitis 04/09/2011   HTN (hypertension) 04/09/2011   Uncontrolled diabetes mellitus with complications ([place]) 82/95/6213     [place] DPT Sharion Settler, SPT  Durwin Reges 12/27/2020, 3:39 PM  [facility] [address], [place], [address] Phone: [phone]   Fax:   [phone]  Name: Daniel [name] MRN: [id number] Date of Birth: [birth year]

## 2020-12-29 ENCOUNTER — Ambulatory Visit: Payer: Medicare HMO | Admitting: Physical Therapy

## 2020-12-29 ENCOUNTER — Encounter: Payer: Self-pay | Admitting: Physical Therapy

## 2020-12-29 DIAGNOSIS — M25512 Pain in left shoulder: Secondary | ICD-10-CM | POA: Diagnosis not present

## 2020-12-29 DIAGNOSIS — G8929 Other chronic pain: Secondary | ICD-10-CM | POA: Diagnosis not present

## 2020-12-29 NOTE — Therapy (Signed)
[facility] [address] [place], [address] Phone: [phone]   Fax:  [phone]  Physical Therapy Treatment Progress Note  Reporting Period 11/15/20-12/29/20  Patient Details  Name: Daniel [name] MRN: [id number] Date of Birth: [birth year] Referring Provider (PT): Dr. Manuella Ghazi   Encounter Date: 12/29/2020   PT End of Session - 12/29/20 1104     Visit Number 10    Number of Visits 17    Date for PT Re-Evaluation 01/03/21    Authorization - Visit Number 10    Authorization - Number of Visits 10    PT Start Time 7564    PT Stop Time 3329    PT Time Calculation (min) 43 min    Activity Tolerance Patient tolerated treatment well    Behavior During Therapy [facility] for tasks assessed/performed             Past Medical History:  Diagnosis Date   Diabetes mellitus without complication ([place])    Hypertension    Pancreatitis    Seizures ([place])    Stroke ([place])    Stroke ([place]) 2016    Past Surgical History:  Procedure Laterality Date   COLONOSCOPY WITH PROPOFOL N/A 10/07/2019   Procedure: COLONOSCOPY WITH PROPOFOL;  Surgeon: Toledo, Benay Pike, MD;  Location: [facility] ENDOSCOPY;  Service: Gastroenterology;  Laterality: N/A;   ESOPHAGOGASTRODUODENOSCOPY     ESOPHAGOGASTRODUODENOSCOPY (EGD) WITH PROPOFOL N/A 10/07/2019   Procedure: ESOPHAGOGASTRODUODENOSCOPY (EGD) WITH PROPOFOL;  Surgeon: Toledo, Benay Pike, MD;  Location: [facility] ENDOSCOPY;  Service: Gastroenterology;  Laterality: N/A;   TEE WITHOUT CARDIOVERSION N/A 11/01/2014   Procedure: TRANSESOPHAGEAL ECHOCARDIOGRAM (TEE);  Surgeon: Wellington Hampshire, MD;  Location: [facility] ORS;  Service: Cardiovascular;  Laterality: N/A;    There were no vitals filed for this visit.   Subjective Assessment - 12/29/20 1058     Subjective Pt reports not feeling well due to difficulty sleeping last night. He reports some soreness from last session and pain rating 5/10 on NPRS.     Pertinent History Patient is a pleasant 65 year old male referred by [employer], for a wheelchair evaluation. Patient experienced a CVA with left sided hemiparesis in 2016. An MRI confirmed Right MCA on 10/27/2014. Patient presents to clinic today stating he a non-functional Left UE, altered sensation on left side of body with difficulty with all mobility and dependent on wife to assist with all ADLs. He states he does not have any type of wheeled mobility devices other than a front wheeled walker and states he has a cane as well but unable to use due to poor balance and increased risk of falling. His past medical history includes history of seizure, memory loss following CVA in 2016, Depression, and Diabetes. Patient reports unable to use a walker due to poor overall Left UE functional use and unsteady with a cane in home with 3 reported falls in past 6 months.    Limitations Standing;Walking;House hold activities;Lifting    How long can you sit comfortably? no limitations    How long can you stand comfortably? Unsteady with all standing and mobility    How long can you walk comfortably? unsteady with all mobility- increased risk of falling.    Patient Stated Goals To increase functional mobility of L UE.              Therex   AAROM Pulleys flexion/scaption 3 x 90 sec each direction * cues to relax L shoulder and  let the R bring it as far as possible *Therapist aided in holding L LE in scaption      Supine AAROM with PVC flex 5 x 30 sec hold with therapist assist to hold   Supine Horizontal Abduction (pec) stretch with PVC 5 x 30 sec with therapist assist to hold laying on half foam roll to improve pec major muscle length   L Upper trap stretch with R cervical sidebending 3 x 30 sec     Manual Therapy   MET to L shoulder Horizontal ADD 8 x 5 sec hold and stretch     Intermittent STM provided to L Pec major and R trapezius during stretches and L shoulder mobility in hopes to increase  L shoulder ABD/Flex AROM by decreasing tone in tissues.                             PT Education - 12/29/20 1054     Education provided Yes    Education Details Pt educated on progress to date and plans to d/c following HEP update next visit    Person(s) Educated Patient    Methods Explanation;Demonstration    Comprehension Verbalized understanding;Returned demonstration              PT Short Term Goals - 12/29/20 1105       PT SHORT TERM GOAL #1   Title Pt will demonstrate independence with HEP to improve UE function for increased ability to participate with ADLs    Baseline 11/15/20: HEP given 12/29/20: Pt independent with HEP    Time 4    Period Weeks    Status Achieved               PT Long Term Goals - 12/29/20 1107       PT LONG TERM GOAL #1   Title Pt will demonstrate 90 degrees of active shoulder flexion & abduction ROM with L UE in order to reach objects at shoulder level and decrease burden on caregiver.    Baseline 11/15/20 Pt approximate L shoulder AROM is currently 30 deg flexion and 20 deg abduction 12/29/20: Flex:90deg ABD: 75 deg (supine)    Time 12    Period Weeks    Status On-going      PT LONG TERM GOAL #2   Title Pt will demostrate 3+/5 strength within available L UE ROM in order to perform ADLs to decrease burden on caregiver.    Baseline 11/15/20 Pt current strength grossly 2/5 L UE in availible range 12/29/20: grossly 3+/5 within available range in sitting    Time 12    Period Weeks    Status Achieved      PT LONG TERM GOAL #3   Title Pt will improve FOTO score to 63 in order to demostrate improvement functional mobilty and quality of life.    Baseline 11/15/20 40 12/29/20:    Time 12    Period Weeks    Status On-going      PT LONG TERM GOAL #4   Title Pt will decrease worst pain as reported on NPRS by at least 3 points in order to demonstrate clinically significant reduction in pain.    Baseline 11/15/20 9/10 12/29/20: 5/10     Time 12    Period Weeks    Status Achieved                   Plan - 12/29/20 1151  Clinical Impression Statement Pt progress note performed this date. Pt is successfully progressing with L shoulder AROM and strength. Pt has also reported decrease in worst pain by 4 points on NPRS and reported increase in FOTO score by 5 points. PT has discussed discharge with patient next visit following update to HEP and demonstration of indenpendence. Pt agreed and verbalized understanding.    Personal Factors and Comorbidities Time since onset of injury/illness/exacerbation;Comorbidity 3+;Education    Comorbidities syncope, seizure, Diabetes    Examination-Activity Limitations Bathing;Bed Mobility;Bend;Carry;Hygiene/Grooming;Lift;Locomotion Level;Reach Overhead;Squat;Stairs;Stand;Toileting;Transfers    Examination-Participation Restrictions Cleaning;Community Activity;Driving;Yard Work;Meal Prep;Medication Management;Shop;Laundry    Stability/Clinical Decision Making Evolving/Moderate complexity    Rehab Potential Fair    PT Frequency 2x / week    PT Duration 12 weeks    PT Treatment/Interventions ADLs/Self Care Home Management;Therapeutic exercise;Therapeutic activities;Functional mobility training;Stair training;Gait training;DME Instruction;Balance training;Neuromuscular re-education;Manual techniques;Splinting;Passive range of motion;Visual/perceptual remediation/compensation;Patient/family education;Orthotic Fit/Training;Wheelchair mobility training;Dry needling;Moist Heat;Cryotherapy;Traction;Electrical Stimulation    PT Next Visit Plan update HEP    PT Home Exercise Plan AAROM flex abd    Consulted and Agree with Plan of Care Patient             Patient will benefit from skilled therapeutic intervention in order to improve the following deficits and impairments:  Abnormal gait, Impaired tone, Decreased balance, Decreased mobility, Decreased endurance, Difficulty walking, Impaired  sensation, Decreased range of motion, Improper body mechanics, Impaired UE functional use, Impaired flexibility, Decreased strength, Decreased coordination, Decreased activity tolerance, Increased fascial restricitons  Visit Diagnosis: Chronic left shoulder pain     Problem List Patient Active Problem List   Diagnosis Date Noted   Syncope 01/02/2020   Syncope, vasovagal 01/01/2020   Closed [place] II fracture ([place]) 01/01/2020   History of CVA (cerebrovascular accident) 01/01/2020   CKD (chronic kidney disease), stage II    Recurrent seizures ([place]) 07/13/2019   Seizure disorder ([place]) 07/12/2019   HLD (hyperlipidemia) 07/12/2019   Type 2 diabetes mellitus with hyperlipidemia ([place]) 07/12/2019   CKD (chronic kidney disease), stage IIIb 07/12/2019   AKI (acute kidney injury) ([place]) 06/13/2019   Hyperkalemia 06/13/2019   Obesity (BMI 30-39.9) 06/13/2019   Depression 06/13/2019   BPH (benign prostatic hyperplasia) 06/13/2019   Peripheral neuropathy 06/13/2019   DKA (diabetic ketoacidoses) 07/68/0881   Acute metabolic encephalopathy 03/27/5944   Stroke ([place]) 10/27/2014   Lymphadenitis 04/09/2011   HTN (hypertension) 04/09/2011   Uncontrolled diabetes mellitus with complications ([place]) 85/92/9244    [place] DPT Sharion Settler, SPT  Durwin Reges 12/29/2020, 3:31 PM  [facility] [address], [place], [address] Phone: [phone]   Fax:  [phone]  Name: Daniel [name] MRN: [id number] Date of Birth: 02-12-1956

## 2021-01-03 ENCOUNTER — Ambulatory Visit: Payer: Medicare HMO | Admitting: Physical Therapy

## 2021-01-05 ENCOUNTER — Ambulatory Visit: Payer: Medicare HMO | Admitting: Physical Therapy

## 2021-01-10 ENCOUNTER — Ambulatory Visit: Payer: Medicare HMO | Admitting: Physical Therapy

## 2021-01-12 ENCOUNTER — Encounter: Payer: Self-pay | Admitting: Physical Therapy

## 2021-01-12 ENCOUNTER — Ambulatory Visit: Payer: Medicare HMO | Admitting: Physical Therapy

## 2021-01-12 DIAGNOSIS — G8929 Other chronic pain: Secondary | ICD-10-CM

## 2021-01-12 DIAGNOSIS — M25512 Pain in left shoulder: Secondary | ICD-10-CM | POA: Diagnosis not present

## 2021-01-12 NOTE — Therapy (Signed)
[facility] [address] [place], [address] Phone: [phone]   Fax:  [phone]  Physical Therapy Treatment Discharge Summary Reporting Period 11/15/20-01/12/21  Patient Details  Name: Daniel [name] MRN: [id number] Date of Birth: 01/12/1956 Referring Provider (PT): Dr. Manuella Ghazi   Encounter Date: 01/12/2021   PT End of Session - 01/12/21 1123     Visit Number 11    Number of Visits 17    Date for PT Re-Evaluation 01/03/21    Authorization - Visit Number 11    Authorization - Number of Visits 10    PT Start Time 9357    PT Stop Time 0177    PT Time Calculation (min) 30 min    Activity Tolerance Patient tolerated treatment well    Behavior During Therapy [facility] for tasks assessed/performed             Past Medical History:  Diagnosis Date   Diabetes mellitus without complication ([place])    Hypertension    Pancreatitis    Seizures ([place])    Stroke ([place])    Stroke ([place]) 2016    Past Surgical History:  Procedure Laterality Date   COLONOSCOPY WITH PROPOFOL N/A 10/07/2019   Procedure: COLONOSCOPY WITH PROPOFOL;  Surgeon: Toledo, Benay Pike, MD;  Location: [facility] ENDOSCOPY;  Service: Gastroenterology;  Laterality: N/A;   ESOPHAGOGASTRODUODENOSCOPY     ESOPHAGOGASTRODUODENOSCOPY (EGD) WITH PROPOFOL N/A 10/07/2019   Procedure: ESOPHAGOGASTRODUODENOSCOPY (EGD) WITH PROPOFOL;  Surgeon: Toledo, Benay Pike, MD;  Location: [facility] ENDOSCOPY;  Service: Gastroenterology;  Laterality: N/A;   TEE WITHOUT CARDIOVERSION N/A 11/01/2014   Procedure: TRANSESOPHAGEAL ECHOCARDIOGRAM (TEE);  Surgeon: Wellington Hampshire, MD;  Location: [facility] ORS;  Service: Cardiovascular;  Laterality: N/A;    There were no vitals filed for this visit.   Subjective Assessment - 01/12/21 1132     Subjective Pt reports feeling well. He denies any pain and reports confidence in discharge this date.    Patient is accompained by: Family member     Pertinent History Patient is a pleasant 65 year old male referred by [employer], for a wheelchair evaluation. Patient experienced a CVA with left sided hemiparesis in 2016. An MRI confirmed Right MCA on 10/27/2014. Patient presents to clinic today stating he a non-functional Left UE, altered sensation on left side of body with difficulty with all mobility and dependent on wife to assist with all ADLs. He states he does not have any type of wheeled mobility devices other than a front wheeled walker and states he has a cane as well but unable to use due to poor balance and increased risk of falling. His past medical history includes history of seizure, memory loss following CVA in 2016, Depression, and Diabetes. Patient reports unable to use a walker due to poor overall Left UE functional use and unsteady with a cane in home with 3 reported falls in past 6 months.    Limitations Standing;Walking;House hold activities;Lifting    How long can you stand comfortably? Unsteady with all standing and mobility                 Therapeutic Exercise  Supine AAROM with PVC flex 5 x 30 sec hold with therapist assist to hold   Supine Horizontal Abduction (pec) stretch with PVC 5 x 30 sec with therapist assist to hold laying on half foam roll to improve pec major muscle length   L Upper trap stretch with R cervical sidebending 3  x 30 sec  HEP Access Code: [email or web address]   Ther-Ex PT reviewed the following HEP with patient with patient able to demonstrate a set of the following with min cuing for correction needed. PT educated patient on parameters of therex (how/when to inc/decrease intensity, frequency, rep/set range, stretch hold time, and purpose of therex) with verbalized understanding.   *Pt appropriate for discharge this date. Pt educated on progress and importance of continuing therapy at home. Pt demonstrated and verbally expressed independence with HEP. Refer to goals and clinical impression statement  for details.                    PT Education - 01/12/21 1203     Education provided Yes    Education Details Pt educated on progress and importance of continuing therapy at home.    Person(s) Educated Patient    Methods Explanation;Demonstration    Comprehension Verbalized understanding;Returned demonstration              PT Short Term Goals - 12/29/20 1105       PT SHORT TERM GOAL #1   Title Pt will demonstrate independence with HEP to improve UE function for increased ability to participate with ADLs    Baseline 11/15/20: HEP given 12/29/20: Pt independent with HEP    Time 4    Period Weeks    Status Achieved               PT Long Term Goals - 01/12/21 1124       PT LONG TERM GOAL #1   Title Pt will demonstrate 90 degrees of active shoulder flexion & abduction ROM with L UE in order to reach objects at shoulder level and decrease burden on caregiver.    Baseline 11/15/20 Pt approximate L shoulder AROM is currently 30 deg flexion and 20 deg abduction 12/29/20: Flex:90deg ABD: 75 deg (supine) 01/12/21:  Flex:90deg ABD: 75 deg    Time 12    Period Weeks    Status Partially Met      PT LONG TERM GOAL #2   Title Pt will demostrate 3+/5 strength within available L UE ROM in order to perform ADLs to decrease burden on caregiver.    Baseline 11/15/20 Pt current strength grossly 2/5 L UE in availible range 12/29/20: grossly 3+/5 within available range in sitting    Time 12    Period Weeks    Status Achieved      PT LONG TERM GOAL #3   Title Pt will improve FOTO score to 63 in order to demostrate improvement functional mobilty and quality of life.    Baseline 11/15/20 40 12/29/20: 01/12/21:    Time 12    Period Weeks      PT LONG TERM GOAL #4   Title Pt will decrease worst pain as reported on NPRS by at least 3 points in order to demonstrate clinically significant reduction in pain.    Baseline 11/15/20 9/10 12/29/20: 5/10    Time 12    Period Weeks    Status  Achieved                   Plan - 01/12/21 1150     Clinical Impression Statement Pt appropriate for discharge this date. Pt educated on progress and importance of continuing therapy at home. Pt demonstrated and verbally expressed independence with HEP. Pt has shown significant increase in L shoulder AROM, strength, and decrease in pain which the patient expressed  satisfaction. End PT POC this episode.    Personal Factors and Comorbidities Time since onset of injury/illness/exacerbation;Comorbidity 3+;Education    Comorbidities syncope, seizure, Diabetes    Examination-Activity Limitations Bathing;Bed Mobility;Bend;Carry;Hygiene/Grooming;Lift;Locomotion Level;Reach Overhead;Squat;Stairs;Stand;Toileting;Transfers    Examination-Participation Restrictions Cleaning;Community Activity;Driving;Yard Work;Meal Prep;Medication Management;Shop;Laundry    Stability/Clinical Decision Making Evolving/Moderate complexity    Rehab Potential Fair    PT Frequency 2x / week    PT Duration 12 weeks    PT Treatment/Interventions ADLs/Self Care Home Management;Therapeutic exercise;Therapeutic activities;Functional mobility training;Stair training;Gait training;DME Instruction;Balance training;Neuromuscular re-education;Manual techniques;Splinting;Passive range of motion;Visual/perceptual remediation/compensation;Patient/family education;Orthotic Fit/Training;Wheelchair mobility training;Dry needling;Moist Heat;Cryotherapy;Traction;Electrical Stimulation    PT Next Visit Plan D/C    PT Home Exercise Plan [id number]    Consulted and Agree with Plan of Care Patient             Patient will benefit from skilled therapeutic intervention in order to improve the following deficits and impairments:  Abnormal gait, Impaired tone, Decreased balance, Decreased mobility, Decreased endurance, Difficulty walking, Impaired sensation, Decreased range of motion, Improper body mechanics, Impaired UE functional use,  Impaired flexibility, Decreased strength, Decreased coordination, Decreased activity tolerance, Increased fascial restricitons  Visit Diagnosis: Chronic left shoulder pain     Problem List Patient Active Problem List   Diagnosis Date Noted   Syncope 01/02/2020   Syncope, vasovagal 01/01/2020   Closed [place] II fracture ([place]) 01/01/2020   History of CVA (cerebrovascular accident) 01/01/2020   CKD (chronic kidney disease), stage II    Recurrent seizures ([place]) 07/13/2019   Seizure disorder ([place]) 07/12/2019   HLD (hyperlipidemia) 07/12/2019   Type 2 diabetes mellitus with hyperlipidemia ([place]) 07/12/2019   CKD (chronic kidney disease), stage IIIb 07/12/2019   AKI (acute kidney injury) ([place]) 06/13/2019   Hyperkalemia 06/13/2019   Obesity (BMI 30-39.9) 06/13/2019   Depression 06/13/2019   BPH (benign prostatic hyperplasia) 06/13/2019   Peripheral neuropathy 06/13/2019   DKA (diabetic ketoacidoses) 40/81/4481   Acute metabolic encephalopathy 85/63/1497   Stroke ([place]) 10/27/2014   Lymphadenitis 04/09/2011   HTN (hypertension) 04/09/2011   Uncontrolled diabetes mellitus with complications ([place]) 02/63/7858     Chelsea Peterson DPT Sharion Settler, SPT  Durwin Reges 01/12/2021, 2:37 PM  [facility] [address] [place], [address] Phone: [phone]   Fax:  [phone]  Name: Daniel [name] MRN: [id number] Date of Birth: [birth year]

## 2021-02-16 ENCOUNTER — Other Ambulatory Visit: Payer: Self-pay

## 2021-02-16 ENCOUNTER — Emergency Department: Payer: Medicare HMO

## 2021-02-16 ENCOUNTER — Emergency Department
Admission: EM | Admit: 2021-02-16 | Discharge: 2021-02-16 | Disposition: A | Discharge status: Home / Self Care | Payer: Medicare HMO | Attending: Emergency Medicine | Admitting: Emergency Medicine

## 2021-02-16 DIAGNOSIS — E1110 Type 2 diabetes mellitus with ketoacidosis without coma: Secondary | ICD-10-CM | POA: Diagnosis not present

## 2021-02-16 DIAGNOSIS — E1122 Type 2 diabetes mellitus with diabetic chronic kidney disease: Secondary | ICD-10-CM | POA: Insufficient documentation

## 2021-02-16 DIAGNOSIS — I129 Hypertensive chronic kidney disease with stage 1 through stage 4 chronic kidney disease, or unspecified chronic kidney disease: Secondary | ICD-10-CM | POA: Insufficient documentation

## 2021-02-16 DIAGNOSIS — R569 Unspecified convulsions: Secondary | ICD-10-CM | POA: Diagnosis not present

## 2021-02-16 DIAGNOSIS — R404 Transient alteration of awareness: Secondary | ICD-10-CM | POA: Diagnosis not present

## 2021-02-16 DIAGNOSIS — E1140 Type 2 diabetes mellitus with diabetic neuropathy, unspecified: Secondary | ICD-10-CM | POA: Diagnosis not present

## 2021-02-16 DIAGNOSIS — Z794 Long term (current) use of insulin: Secondary | ICD-10-CM | POA: Diagnosis not present

## 2021-02-16 DIAGNOSIS — R0902 Hypoxemia: Secondary | ICD-10-CM | POA: Diagnosis not present

## 2021-02-16 DIAGNOSIS — Z87891 Personal history of nicotine dependence: Secondary | ICD-10-CM | POA: Insufficient documentation

## 2021-02-16 DIAGNOSIS — I10 Essential (primary) hypertension: Secondary | ICD-10-CM | POA: Diagnosis not present

## 2021-02-16 DIAGNOSIS — N1832 Chronic kidney disease, stage 3b: Secondary | ICD-10-CM | POA: Diagnosis not present

## 2021-02-16 DIAGNOSIS — R222 Localized swelling, mass and lump, trunk: Secondary | ICD-10-CM | POA: Diagnosis not present

## 2021-02-16 DIAGNOSIS — G40909 Epilepsy, unspecified, not intractable, without status epilepticus: Secondary | ICD-10-CM | POA: Diagnosis not present

## 2021-02-16 DIAGNOSIS — Z7984 Long term (current) use of oral hypoglycemic drugs: Secondary | ICD-10-CM | POA: Insufficient documentation

## 2021-02-16 DIAGNOSIS — R000 Tachycardia, unspecified: Secondary | ICD-10-CM | POA: Diagnosis not present

## 2021-02-16 LAB — URINALYSIS, COMPLETE (UACMP) WITH MICROSCOPIC
Bilirubin Urine: NEGATIVE
Glucose, UA: NEGATIVE mg/dL
Ketones, ur: NEGATIVE mg/dL
Leukocytes,Ua: NEGATIVE
Nitrite: NEGATIVE
Protein, ur: 30 mg/dL — AB
Specific Gravity, Urine: 1.039 — ABNORMAL HIGH (ref 1.005–1.030)
Squamous Epithelial / HPF: NONE SEEN (ref 0–5)
pH: 5 (ref 5.0–8.0)

## 2021-02-16 LAB — HEPATIC FUNCTION PANEL
ALT: 50 U/L — ABNORMAL HIGH (ref 0–44)
AST: 32 U/L (ref 15–41)
Albumin: 4 g/dL (ref 3.5–5.0)
Alkaline Phosphatase: 82 U/L (ref 38–126)
Bilirubin, Direct: <0.1 mg/dL (ref 0.0–0.2)
Total Bilirubin: 1.1 mg/dL (ref 0.3–1.2)
Total Protein: 7.4 g/dL (ref 6.5–8.1)

## 2021-02-16 LAB — BASIC METABOLIC PANEL
Anion gap: 4 — ABNORMAL LOW (ref 5–15)
BUN: 14 mg/dL (ref 8–23)
CO2: 27 mmol/L (ref 22–32)
Calcium: 9.4 mg/dL (ref 8.9–10.3)
Chloride: 108 mmol/L (ref 98–111)
Creatinine, Ser: 1.53 mg/dL — ABNORMAL HIGH (ref 0.61–1.24)
GFR, Estimated: 50 mL/min — ABNORMAL LOW (ref >60)
Glucose, Bld: 114 mg/dL — ABNORMAL HIGH (ref 70–99)
Potassium: 4.5 mmol/L (ref 3.5–5.1)
Sodium: 139 mmol/L (ref 135–145)

## 2021-02-16 LAB — CBC
HCT: 41.6 % (ref 39.0–52.0)
Hemoglobin: 13.8 g/dL (ref 13.0–17.0)
MCH: 27 pg (ref 26.0–34.0)
MCHC: 33.2 g/dL (ref 30.0–36.0)
MCV: 81.4 fL (ref 80.0–100.0)
Platelets: 172 10*3/uL (ref 150–400)
RBC: 5.11 MIL/uL (ref 4.22–5.81)
RDW: 14.6 % (ref 11.5–15.5)
WBC: 3.9 10*3/uL — ABNORMAL LOW (ref 4.0–10.5)
nRBC: 0 % (ref 0.0–0.2)

## 2021-02-16 LAB — LACTIC ACID, PLASMA: Lactic Acid, Venous: 1.4 mmol/L (ref 0.5–1.9)

## 2021-02-16 MED ORDER — SODIUM CHLORIDE 0.9 % IV BOLUS
500.0000 mL | Freq: Once | INTRAVENOUS | Status: AC
Start: 2021-02-16 — End: 2021-02-16
  Administered 2021-02-16: 500 mL via INTRAVENOUS

## 2021-02-16 MED ORDER — LEVETIRACETAM IN NACL 1500 MG/100ML IV SOLN
1500.0000 mg | Freq: Once | INTRAVENOUS | Status: AC
  Administered 2021-02-16: 1500 mg via INTRAVENOUS
  Filled 2021-02-16: qty 100

## 2021-02-16 MED ORDER — IOHEXOL 350 MG/ML SOLN
75.0000 mL | Freq: Once | INTRAVENOUS | Status: AC | PRN
  Administered 2021-02-16: 75 mL via INTRAVENOUS

## 2021-02-16 NOTE — ED Provider Notes (Signed)
Emergency Medicine Provider Triage Evaluation Note  Daniel [name] , a 65 y.o. male  was evaluated in triage.  Pt complains of period of unresponsiveness.  Patient was visiting his wife at dialysis when he was found unresponsive on the floor.  History of CVA and seizure.  Patient states that he feels like he had a seizure though this also was not witnessed.  Feels slightly confused/postictal which is consistent with his previous seizures.  Patient's last seizure was 3 months ago.  He states that he has been taking his medicine when she was unsure of the name of every day.  Patient is on Lamictal for same..  Review of Systems  Positive: Possible seizure activity.  Unwitnessed.  History of CVA and seizure.  Found unresponsive on the floor Negative: Headache, visual changes, change and chronic weakness left side from previous CVA  Physical Exam  BP 123/78   Pulse 75   Temp 98.8 F (37.1 C) (Oral)   Resp 18   SpO2 94%  Gen:   Awake, no distress.  Patient feels confused, drowsy Resp:  Normal effort patient MSK:   Moves extremities without difficulty  Other:  Cranial nerves II through XII grossly intact chronic neurodeficits on left side weakness  Medical Decision Making  Medically screening exam initiated at 12:55 PM.  Appropriate orders placed.  Daniel [name] was informed that the remainder of the evaluation will be completed by another provider, this initial triage assessment does not replace that evaluation, and the importance of remaining in the ED until their evaluation is complete.  Patient was found unresponsive on the floor while visiting his wife at dialysis.  History of CVA and seizure disorder.  States this feels most consistent with his previous seizures.  Last seizure was 3 months ago.  Taking his medication as prescribed.  Patient takes Lamictal.  Patient states that he feels postictal and appears drowsy and slightly confused.  Patient will have labs, CT scan at this time    [name] 02/16/21 1304    Blake Divine, MD 02/16/21 1710

## 2021-02-16 NOTE — ED Notes (Signed)
Pt up to use bathroom 

## 2021-02-16 NOTE — ED Triage Notes (Addendum)
See first nurse note- pt to ER with complaints of seizure today. Reports last seizure was 3 months ago. Re[ports being compliant with prescribed medications.   Residual left sided weakness from a previous stroke.

## 2021-02-16 NOTE — Discharge Instructions (Addendum)
Continue taking your Lamictal as prescribed.  Return to the ED with any further worsening symptoms or recurrent seizures.  Follow-up with Dr. Manuella Ghazi in the clinic.

## 2021-02-16 NOTE — ED Provider Notes (Signed)
[facility] Emergency Department Provider Note ____________________________________________   Event Date/Time   First MD Initiated Contact with Patient 02/16/21 1549     (approximate)  I have reviewed the triage vital signs and the nursing notes.  HISTORY  Chief Complaint Seizures   HPI Daniel [name] is a 65 y.o. malewho presents to the ED for evaluation of seizure.  Chart review indicates history of MCA stroke and subsequent seizures, vascular dementia, HTN, HLD and DM.  Morbid obesity.  Left-sided hemiparesis.  Dr. Manuella Ghazi with neurology lamotrigine  Patient presents to the ED for evaluation of seizure.  He was accompanying his wife at the dialysis clinic.  While she was getting hooked up, he went and walked around the clinic outside, on a hot day, and shortly after coming inside he fell to the ground and had a typical generalized seizure.  Lasting a couple minutes and self resolving without intervention.  Of note, he did have a second seizure today in the CT scanner.  After arrival to triage she was slightly postictal, but had improving clinical picture while in triage, returned to baseline.  His work-up included a CXR with possible abnormality, so he returned to CT to get a CT chest, and during this study had another typical generalized seizure, lasting a couple minutes and resolving without intervention.  When I first evaluate him, he is postictal in the room and cannot provide any relevant history. Supplemental history is obtained by the wife. No known illnesses, recent medication changes or other events before today.   Past Medical History:  Diagnosis Date   Diabetes mellitus without complication ([place])    Hypertension    Pancreatitis    Seizures ([place])    Stroke [facility])    Stroke ([place]) 2016    Patient Active Problem List   Diagnosis Date Noted   Syncope 01/02/2020   Syncope, vasovagal 01/01/2020   Closed [place] II fracture ([place]) 01/01/2020    History of CVA (cerebrovascular accident) 01/01/2020   CKD (chronic kidney disease), stage II    Recurrent seizures ([place]) 07/13/2019   Seizure disorder ([place]) 07/12/2019   HLD (hyperlipidemia) 07/12/2019   Type 2 diabetes mellitus with hyperlipidemia ([place]) 07/12/2019   CKD (chronic kidney disease), stage IIIb 07/12/2019   AKI (acute kidney injury) ([place]) 06/13/2019   Hyperkalemia 06/13/2019   Obesity (BMI 30-39.9) 06/13/2019   Depression 06/13/2019   BPH (benign prostatic hyperplasia) 06/13/2019   Peripheral neuropathy 06/13/2019   DKA (diabetic ketoacidoses) 46/80/3212   Acute metabolic encephalopathy 24/82/5003   Stroke ([place]) 10/27/2014   Lymphadenitis 04/09/2011   HTN (hypertension) 04/09/2011   Uncontrolled diabetes mellitus with complications ([place]) 70/48/8891    Past Surgical History:  Procedure Laterality Date   COLONOSCOPY WITH PROPOFOL N/A 10/07/2019   Procedure: COLONOSCOPY WITH PROPOFOL;  Surgeon: Toledo, Benay Pike, MD;  Location: [facility] ENDOSCOPY;  Service: Gastroenterology;  Laterality: N/A;   ESOPHAGOGASTRODUODENOSCOPY     ESOPHAGOGASTRODUODENOSCOPY (EGD) WITH PROPOFOL N/A 10/07/2019   Procedure: ESOPHAGOGASTRODUODENOSCOPY (EGD) WITH PROPOFOL;  Surgeon: Toledo, Benay Pike, MD;  Location: [facility] ENDOSCOPY;  Service: Gastroenterology;  Laterality: N/A;   TEE WITHOUT CARDIOVERSION N/A 11/01/2014   Procedure: TRANSESOPHAGEAL ECHOCARDIOGRAM (TEE);  Surgeon: Wellington Hampshire, MD;  Location: [facility] ORS;  Service: Cardiovascular;  Laterality: N/A;    Prior to Admission medications   Medication Sig Start Date End Date Taking? Authorizing Provider  atorvastatin (LIPITOR) 40 MG tablet Take 40 mg by mouth daily. 08/17/18   [provider]  blood  glucose meter kit and supplies KIT Dispense based on patient and insurance preference. Use up to four times daily as directed. (FOR ICD-9 250.00, 250.01). 06/16/19   Annita Brod, MD  Docusate Sodium (DSS) 100 MG CAPS Take 100 mg by mouth  in the morning and at bedtime. 08/04/19   [provider]  DULoxetine (CYMBALTA) 30 MG capsule Take 30 mg by mouth daily. Take with 60 mg 10/21/18   [provider]  DULoxetine (CYMBALTA) 60 MG capsule Take 60 mg by mouth daily. Takes with 30 mg 10/24/18   [provider]  gabapentin (NEURONTIN) 300 MG capsule Take 600 mg by mouth 3 (three) times daily.    [provider]  insulin glargine (LANTUS) 100 UNIT/ML injection Inject 0.25 mLs (25 Units total) into the skin at bedtime. 01/02/20   Loletha Grayer, MD  lamoTRIgine (LAMICTAL) 200 MG tablet Take 1 tablet (200 mg total) by mouth 2 (two) times daily. 07/13/19   Lavina Hamman, MD  metFORMIN (GLUCOPHAGE) 500 MG tablet Take 500 mg by mouth 2 (two) times daily. 06/22/19   [provider]  nortriptyline (PAMELOR) 50 MG capsule Take 50 mg by mouth at bedtime.    [provider]  tamsulosin (FLOMAX) 0.4 MG CAPS capsule Take 1 capsule (0.4 mg total) by mouth daily. 07/24/17   Hollice Espy, MD    Allergies Patient has no known allergies.  Family History  Problem Relation Age of Onset   CAD Sister    Diabetes Sister    Hypertension Sister    Diabetes Sister    Prostate cancer Father    Diabetes Mother    Bladder Cancer Neg Hx    Kidney cancer Neg Hx     Social History Social History   Tobacco Use   Smoking status: Former    Packs/day: 0.50    Years: 45.00    Pack years: 22.50    Types: Cigarettes    Quit date: 10/28/2014    Years since quitting: 6.3   Smokeless tobacco: Never  Vaping Use   Vaping Use: Never used  Substance Use Topics   Alcohol use: Never   Drug use: No    Review of Systems  Unable to be accurately assessed due to postictal patient. ____________________________________________   PHYSICAL EXAM:  VITAL SIGNS: Vitals:   02/16/21 1645 02/16/21 1700  BP:  134/85  Pulse: 91 87  Resp: 18 14  Temp:    SpO2: 91% 93%    Constitutional: Somnolent on my  initial presentation without distress or neurologic deficit. Upon reevaluation, he is awake, alert Eyes: Conjunctivae are normal. PERRL. EOMI. Head: Atraumatic. Nose: No congestion/rhinnorhea. Mouth/Throat: Mucous membranes are moist.  Oropharynx non-erythematous. Neck: No stridor. No cervical spine tenderness to palpation. Cardiovascular: Normal rate, regular rhythm. Grossly normal heart sounds.  Good peripheral circulation. Respiratory: Normal respiratory effort.  No retractions. Lungs CTAB. Gastrointestinal: Soft , nondistended, nontender to palpation. No CVA tenderness. Musculoskeletal: No lower extremity tenderness nor edema.  No joint effusions. No signs of acute trauma. Neurologic:  Normal speech and language. No acute focal neurologic deficits are appreciated.  Cranial nerves II through XII intact Skin:  Skin is warm, dry and intact. No rash noted. Psychiatric: Mood and affect are normal. Speech and behavior are normal. ____________________________________________   LABS (all labs ordered are listed, but only abnormal results are displayed)  Labs Reviewed  BASIC METABOLIC PANEL - Abnormal; Notable for the following components:      Result Value  Glucose, Bld 114 (*)    Creatinine, Ser 1.53 (*)    GFR, Estimated 50 (*)    Anion gap 4 (*)    All other components within normal limits  CBC - Abnormal; Notable for the following components:   WBC 3.9 (*)    All other components within normal limits  URINALYSIS, COMPLETE (UACMP) WITH MICROSCOPIC - Abnormal; Notable for the following components:   Color, Urine YELLOW (*)    APPearance CLEAR (*)    Specific Gravity, Urine 1.039 (*)    Hgb urine dipstick SMALL (*)    Protein, ur 30 (*)    Bacteria, UA RARE (*)    All other components within normal limits  HEPATIC FUNCTION PANEL - Abnormal; Notable for the following components:   ALT 50 (*)    All other components within normal limits  LACTIC ACID, PLASMA  LAMOTRIGINE LEVEL   LACTIC ACID, PLASMA  CBG MONITORING, ED   ____________________________________________  12 Lead EKG  Sinus rhythm at a rate of 115 bpm.  Normal axis and intervals.  No evidence of acute ischemia. ____________________________________________  RADIOLOGY  ED MD interpretation: 2 view CXR reviewed by me with possible right-sided infrahilar pulmonary mass. CT head reviewed by me without evidence of acute intracranial pathology. CT chest reviewed to me without evidence of obvious pulmonary or hilar mass  Official radiology report(s): DG Chest 2 View  Addendum Date: 02/16/2021   ADDENDUM REPORT: 02/16/2021 14:19 ADDENDUM: There is an opacity in the RIGHT infrahilar region as large as 3.6 x 4.0 cm. This does not have a lobar configuration. Added density projects in the retrocardiac region on the lateral view. Pulmonary mass is considered. Suggest CT of the chest for further evaluation. These results were called by telephone at the time of interpretation on 02/16/2021 at 2:19 pm to provider [facility] , who verbally acknowledged these results. Electronically Signed   By: Zetta Bills M.D.   On: 02/16/2021 14:19   Result Date: 02/16/2021 CLINICAL DATA:  Seizure, history of seizure today in a 65 year old male. EXAM: CHEST - 2 VIEW COMPARISON:  October 27, 2014. FINDINGS: Trachea midline. Cardiomediastinal contours and hilar structures are stable. No lobar consolidation. No sign of effusion. No pneumothorax. Signs of prior trauma to posterior ribs along the LEFT chest with healed rib fractures with unchanged appearance. No acute skeletal process on limited assessment. IMPRESSION: No active cardiopulmonary disease. Electronically Signed: By: Zetta Bills M.D. On: 02/16/2021 14:06   CT HEAD WO CONTRAST (5MM)  Result Date: 02/16/2021 CLINICAL DATA:  Unwitnessed seizure. History of stroke with left-sided weakness. EXAM: CT HEAD WITHOUT CONTRAST TECHNIQUE: Contiguous axial images were obtained from  the base of the skull through the vertex without intravenous contrast. COMPARISON:  08/21/2020 FINDINGS: Brain: Old right MCA distribution infarct. Age advanced cerebellar and cerebral atrophy. No mass lesion, hemorrhage, hydrocephalus, acute infarct, intra-axial, or extra-axial fluid collection. Vascular: No hyperdense vessel or unexpected calcification. Skull: No significant soft tissue swelling.  No skull fracture. Sinuses/Orbits: Normal imaged portions of the orbits and globes. Clear paranasal sinuses and mastoid air cells. Cerumen in the external ear canals. Other: None. IMPRESSION: 1.  No acute intracranial abnormality. 2. Old right MCA distribution infarct Electronically Signed   By: Abigail Miyamoto M.D.   On: 02/16/2021 13:44   CT Chest W Contrast  Result Date: 02/16/2021 CLINICAL DATA:  Chest mass. EXAM: CT CHEST WITH CONTRAST TECHNIQUE: Multidetector CT imaging of the chest was performed during intravenous contrast administration. However, exam  is limited due to persistent patient motion artifact. CONTRAST:  62m OMNIPAQUE IOHEXOL 350 MG/ML SOLN COMPARISON:  Radiograph of same day.  CT of Oct 04, 2013. FINDINGS: Cardiovascular: No significant vascular findings. Normal heart size. No pericardial effusion. Mediastinum/Nodes: No enlarged mediastinal, hilar, or axillary lymph nodes. Thyroid gland, trachea, and esophagus demonstrate no significant findings. Lungs/Pleura: No pneumothorax or pleural effusion is noted. No definite pulmonary abnormality is noted, although visualization of the lung bases is noted due to previously described respiratory motion artifact. Upper Abdomen: No acute abnormality. Musculoskeletal: Old left rib fractures are noted. No acute osseous abnormality is noted. IMPRESSION: Limited exam due to persistent respiratory motion artifact. Old left rib fractures are noted. No definite pulmonary abnormality is noted, although evaluation of lung bases is significantly limited due to motion  artifact. Electronically Signed   By: JMarijo ConceptionM.D.   On: 02/16/2021 16:29    ____________________________________________   PROCEDURES and INTERVENTIONS  Procedure(s) performed (including Critical Care):  .1-3 Lead EKG Interpretation Performed by: SVladimir Crofts MD Authorized by: SVladimir Crofts MD     Interpretation: normal     ECG rate:  90   ECG rate assessment: normal     Rhythm: sinus rhythm     Ectopy: none     Conduction: normal    Medications  sodium chloride 0.9 % bolus 500 mL (0 mLs Intravenous Stopped 02/16/21 1654)  iohexol (OMNIPAQUE) 350 MG/ML injection 75 mL (75 mLs Intravenous Contrast Given 02/16/21 1537)  levETIRAcetam (KEPPRA) IVPB 1500 mg/ 100 mL premix (0 mg Intravenous Stopped 02/16/21 1731)    ____________________________________________   MDM / ED COURSE   65year old male presents to the ED with 2 seizures, a benign work-up, and not quite meeting status epilepticus criteria, and ultimately amenable to outpatient management.  Initially is postictal on my evaluation, but with clears appropriately and he returned to neurologic baseline.  Work-up is benign without evidence of underlying cause such as infection, sepsis, UTI, CAP, ICH or CVA.  We will send a lamotrigine level and have him follow-up with his neurologist.  Return precautions discussed.  Clinical Course as of 02/16/21 2012  Thu Feb 16, 2021  1421 Received a call from radiologist informing that there is a mass in the right chest on x-ray.  Recommended CT scan. [JC]  1603 Patient back to the room and evaluated by me.  Had another seizure while in the CT table getting a CT chest due to a right-sided infrahilar mass on his CXR.  He was reportedly slightly postictal in triage, clearing up and essentially back to his baseline when he went for this CT chest.  Now postictal [DS]  1628 I reassessed the patient.  He was up and ambulating to the bathroom, I chaperoned her back to bed [DS]  1713  Reassessed. Wife in the room. Pt more awake. He seems fine to her.  She is requesting discharge for the patient [DS]  2009 Reassessed.  Wife again is requesting discharge.  Patient is awake, alert, ambulatory and tolerating p.o. intake without complication. [DS]    Clinical Course User Index [DS] SVladimir Crofts MD [JC] Cuthriell, JCharline Bills PA-C    ____________________________________________   FINAL CLINICAL IMPRESSION(S) / ED DIAGNOSES  Final diagnoses:  Seizure ([facility]     ED Discharge Orders     None        Toneshia Coello STamala Julian  Note:  This document was prepared using Dragon voice recognition software and may include unintentional dictation errors.  Vladimir Crofts, MD 02/16/21 2014

## 2021-02-16 NOTE — ED Notes (Signed)
Pt given sandwich tray and water 

## 2021-02-16 NOTE — ED Triage Notes (Signed)
Pt comes into the ED via EMS from visiting with his wife at her dialysis, had an unwitnessed seizure, found on floor on there arrival,hx of stroke with left sided weakness, on arrival was disoriented, on arrival to the ED a/ox4.The patient has a history of seizures Wife is with the pt in the [place] at present  [id number] 174/102 150/94

## 2021-02-17 LAB — LAMOTRIGINE LEVEL: Lamotrigine Lvl: 3.6 ug/mL (ref 2.0–20.0)

## 2021-02-19 ENCOUNTER — Emergency Department: Payer: Medicare HMO

## 2021-02-19 ENCOUNTER — Emergency Department
Admission: EM | Admit: 2021-02-19 | Discharge: 2021-02-19 | Disposition: A | Discharge status: Home / Self Care | Payer: Medicare HMO | Attending: Emergency Medicine | Admitting: Emergency Medicine

## 2021-02-19 ENCOUNTER — Other Ambulatory Visit: Payer: Self-pay

## 2021-02-19 DIAGNOSIS — I129 Hypertensive chronic kidney disease with stage 1 through stage 4 chronic kidney disease, or unspecified chronic kidney disease: Secondary | ICD-10-CM | POA: Insufficient documentation

## 2021-02-19 DIAGNOSIS — R531 Weakness: Secondary | ICD-10-CM

## 2021-02-19 DIAGNOSIS — Z7984 Long term (current) use of oral hypoglycemic drugs: Secondary | ICD-10-CM | POA: Insufficient documentation

## 2021-02-19 DIAGNOSIS — R569 Unspecified convulsions: Secondary | ICD-10-CM | POA: Diagnosis not present

## 2021-02-19 DIAGNOSIS — N1832 Chronic kidney disease, stage 3b: Secondary | ICD-10-CM | POA: Insufficient documentation

## 2021-02-19 DIAGNOSIS — R0902 Hypoxemia: Secondary | ICD-10-CM | POA: Diagnosis not present

## 2021-02-19 DIAGNOSIS — E119 Type 2 diabetes mellitus without complications: Secondary | ICD-10-CM | POA: Insufficient documentation

## 2021-02-19 DIAGNOSIS — W19XXXA Unspecified fall, initial encounter: Secondary | ICD-10-CM

## 2021-02-19 DIAGNOSIS — R0602 Shortness of breath: Secondary | ICD-10-CM | POA: Diagnosis not present

## 2021-02-19 DIAGNOSIS — G9389 Other specified disorders of brain: Secondary | ICD-10-CM | POA: Diagnosis not present

## 2021-02-19 DIAGNOSIS — Z043 Encounter for examination and observation following other accident: Secondary | ICD-10-CM | POA: Diagnosis not present

## 2021-02-19 DIAGNOSIS — I10 Essential (primary) hypertension: Secondary | ICD-10-CM | POA: Diagnosis not present

## 2021-02-19 DIAGNOSIS — Z79899 Other long term (current) drug therapy: Secondary | ICD-10-CM | POA: Insufficient documentation

## 2021-02-19 DIAGNOSIS — Z87891 Personal history of nicotine dependence: Secondary | ICD-10-CM | POA: Insufficient documentation

## 2021-02-19 DIAGNOSIS — J9811 Atelectasis: Secondary | ICD-10-CM | POA: Diagnosis not present

## 2021-02-19 LAB — BASIC METABOLIC PANEL
Anion gap: 7 (ref 5–15)
BUN: 10 mg/dL (ref 8–23)
CO2: 25 mmol/L (ref 22–32)
Calcium: 8.9 mg/dL (ref 8.9–10.3)
Chloride: 107 mmol/L (ref 98–111)
Creatinine, Ser: 1.6 mg/dL — ABNORMAL HIGH (ref 0.61–1.24)
GFR, Estimated: 48 mL/min — ABNORMAL LOW (ref >60)
Glucose, Bld: 167 mg/dL — ABNORMAL HIGH (ref 70–99)
Potassium: 4.1 mmol/L (ref 3.5–5.1)
Sodium: 139 mmol/L (ref 135–145)

## 2021-02-19 LAB — CBC
HCT: 40.7 % (ref 39.0–52.0)
Hemoglobin: 13.3 g/dL (ref 13.0–17.0)
MCH: 26.7 pg (ref 26.0–34.0)
MCHC: 32.7 g/dL (ref 30.0–36.0)
MCV: 81.6 fL (ref 80.0–100.0)
Platelets: 159 10*3/uL (ref 150–400)
RBC: 4.99 MIL/uL (ref 4.22–5.81)
RDW: 14.6 % (ref 11.5–15.5)
WBC: 3.6 10*3/uL — ABNORMAL LOW (ref 4.0–10.5)
nRBC: 0 % (ref 0.0–0.2)

## 2021-02-19 LAB — TROPONIN I (HIGH SENSITIVITY)
Troponin I (High Sensitivity): 5 ng/L (ref <18)
Troponin I (High Sensitivity): 4 ng/L (ref <18)

## 2021-02-19 MED ORDER — SODIUM CHLORIDE 0.9 % IV BOLUS
500.0000 mL | Freq: Once | INTRAVENOUS | Status: AC
  Administered 2021-02-19: 500 mL via INTRAVENOUS

## 2021-02-19 NOTE — Discharge Instructions (Addendum)
Your work-up was reassuring.  There is no evidence of heart attack.  He appears to be at his baseline self and his CT of his head was negative.  He can follow-up with his primary care doctor and return to the ER if he develops worsening symptoms or any other concerns

## 2021-02-19 NOTE — ED Triage Notes (Signed)
First nurse note: pt comes ems from home. Was behind his apartment, got weak and knees buckled. No LOC, no injury, no thinners, did not hit head. Hx of left sided paralysis that is the same from stroke. Hx of seizures and was here 3 days ago for same. CBG 126. 143/90.

## 2021-02-19 NOTE — ED Provider Notes (Signed)
[facility] Emergency Department Provider Note  ____________________________________________   Event Date/Time   First MD Initiated Contact with Patient 02/19/21 1119     (approximate)  I have reviewed the triage vital signs and the nursing notes.   HISTORY  Chief Complaint Weakness    HPI Daniel [name] is a 65 y.o. male with diabetes, hypertension, prior MCA stroke with left-sided hemiparesis, with subsequent seizures who comes in with concerns for weakness.  Patient typically ambulates with a cane.  He went outside and he was trying to wash out a basket when he was leaning over and he stated that his legs gave out.  After getting down on the ground he stated that he could not get himself back up.  Patient stated that he cannot landed on his left arm.  He reports some pain in the left arm but he does have some chronic pain so is hard to tell this is new or old he has limited range of motion secondary to paralysis of that arm and contractures.  He denies any pain in his left leg.  He states that he did not hit his head and is not sure if he may be lost consciousness from what he knows.  Denies any seizure activity or feeling like he had had a seizure.  He denies any chest pain.  He reports maybe a little bit of shortness of breath but states that this kind of chronic for him.  Wife is at bedside who states that he typically cannot bend over to do things so she thinks that he just lost his balance and that is why he fell.  Currently she reports that he is at his baseline mental status, no changes in his speech, no changes in his weakness.  He also reports feeling closer to his baseline self at this time.          Past Medical History:  Diagnosis Date   Diabetes mellitus without complication ([place])    Hypertension    Pancreatitis    Seizures ([place])    Stroke [facility])    Stroke ([place]) 2016    Patient Active Problem List   Diagnosis Date Noted   Syncope  01/02/2020   Syncope, vasovagal 01/01/2020   Closed [place] II fracture ([place]) 01/01/2020   History of CVA (cerebrovascular accident) 01/01/2020   CKD (chronic kidney disease), stage II    Recurrent seizures ([place]) 07/13/2019   Seizure disorder ([place]) 07/12/2019   HLD (hyperlipidemia) 07/12/2019   Type 2 diabetes mellitus with hyperlipidemia ([place]) 07/12/2019   CKD (chronic kidney disease), stage IIIb 07/12/2019   AKI (acute kidney injury) ([place]) 06/13/2019   Hyperkalemia 06/13/2019   Obesity (BMI 30-39.9) 06/13/2019   Depression 06/13/2019   BPH (benign prostatic hyperplasia) 06/13/2019   Peripheral neuropathy 06/13/2019   DKA (diabetic ketoacidoses) 53/61/4431   Acute metabolic encephalopathy 54/00/8676   Stroke ([place]) 10/27/2014   Lymphadenitis 04/09/2011   HTN (hypertension) 04/09/2011   Uncontrolled diabetes mellitus with complications ([place]) 19/50/9326    Past Surgical History:  Procedure Laterality Date   COLONOSCOPY WITH PROPOFOL N/A 10/07/2019   Procedure: COLONOSCOPY WITH PROPOFOL;  Surgeon: Toledo, Benay Pike, MD;  Location: [facility] ENDOSCOPY;  Service: Gastroenterology;  Laterality: N/A;   ESOPHAGOGASTRODUODENOSCOPY     ESOPHAGOGASTRODUODENOSCOPY (EGD) WITH PROPOFOL N/A 10/07/2019   Procedure: ESOPHAGOGASTRODUODENOSCOPY (EGD) WITH PROPOFOL;  Surgeon: Toledo, Benay Pike, MD;  Location: [facility] ENDOSCOPY;  Service: Gastroenterology;  Laterality: N/A;   TEE WITHOUT CARDIOVERSION N/A 11/01/2014  Procedure: TRANSESOPHAGEAL ECHOCARDIOGRAM (TEE);  Surgeon: Wellington Hampshire, MD;  Location: [facility] ORS;  Service: Cardiovascular;  Laterality: N/A;    Prior to Admission medications   Medication Sig Start Date End Date Taking? Authorizing Provider  atorvastatin (LIPITOR) 40 MG tablet Take 40 mg by mouth daily. 08/17/18   [provider]  blood glucose meter kit and supplies KIT Dispense based on patient and insurance preference. Use up to four times daily as directed. (FOR ICD-9 250.00,  250.01). 06/16/19   Annita Brod, MD  Docusate Sodium (DSS) 100 MG CAPS Take 100 mg by mouth in the morning and at bedtime. 08/04/19   [provider]  DULoxetine (CYMBALTA) 30 MG capsule Take 30 mg by mouth daily. Take with 60 mg 10/21/18   [provider]  DULoxetine (CYMBALTA) 60 MG capsule Take 60 mg by mouth daily. Takes with 30 mg 10/24/18   [provider]  gabapentin (NEURONTIN) 300 MG capsule Take 600 mg by mouth 3 (three) times daily.    [provider]  insulin glargine (LANTUS) 100 UNIT/ML injection Inject 0.25 mLs (25 Units total) into the skin at bedtime. 01/02/20   Loletha Grayer, MD  lamoTRIgine (LAMICTAL) 200 MG tablet Take 1 tablet (200 mg total) by mouth 2 (two) times daily. 07/13/19   Lavina Hamman, MD  metFORMIN (GLUCOPHAGE) 500 MG tablet Take 500 mg by mouth 2 (two) times daily. 06/22/19   [provider]  nortriptyline (PAMELOR) 50 MG capsule Take 50 mg by mouth at bedtime.    [provider]  tamsulosin (FLOMAX) 0.4 MG CAPS capsule Take 1 capsule (0.4 mg total) by mouth daily. 07/24/17   Hollice Espy, MD    Allergies Patient has no known allergies.  Family History  Problem Relation Age of Onset   CAD Sister    Diabetes Sister    Hypertension Sister    Diabetes Sister    Prostate cancer Father    Diabetes Mother    Bladder Cancer Neg Hx    Kidney cancer Neg Hx     Social History Social History   Tobacco Use   Smoking status: Former    Packs/day: 0.50    Years: 45.00    Pack years: 22.50    Types: Cigarettes    Quit date: 10/28/2014    Years since quitting: 6.3   Smokeless tobacco: Never  Vaping Use   Vaping Use: Never used  Substance Use Topics   Alcohol use: Never   Drug use: No      Review of Systems Constitutional: No fever/chills, weakness, fall Eyes: No visual changes. ENT: No sore throat. Cardiovascular: Denies chest pain. Respiratory: Denies shortness of breath. Gastrointestinal:  No abdominal pain.  No nausea, no vomiting.  No diarrhea.  No constipation. Genitourinary: Negative for dysuria. Musculoskeletal: Negative for back pain. Shoulder pain  Skin: Negative for rash. Neurological: Negative for headaches, focal weakness or numbness. All other ROS negative ____________________________________________   PHYSICAL EXAM:  VITAL SIGNS: ED Triage Vitals  Enc Vitals Group     BP 02/19/21 1055 (!) 131/94     Pulse Rate 02/19/21 1055 90     Resp 02/19/21 1055 18     Temp 02/19/21 1055 98.3 F (36.8 C)     Temp Source 02/19/21 1055 Oral     SpO2 02/19/21 1055 97 %     Weight 02/19/21 1056 231 lb (104.8 kg)     Height 02/19/21 1056 '5\' 6"'  (1.676 m)  Head Circumference --      Peak Flow --      Pain Score 02/19/21 1056 0     Pain Loc --      Pain Edu? --      Excl. in [name]? --     Constitutional: Alert and oriented. Well appearing and in no acute distress. Eyes: Conjunctivae are normal. EOMI. Head: Atraumatic. Nose: No congestion/rhinnorhea. Mouth/Throat: Mucous membranes are moist.   Neck: No stridor. Trachea Midline. FROM Cardiovascular: Normal rate, regular rhythm. Grossly normal heart sounds.  Good peripheral circulation. Respiratory: Normal respiratory effort.  No retractions. Lungs CTAB. Gastrointestinal: Soft and nontender. No distention. No abdominal bruits.  Musculoskeletal: Left hand with contracture.  Unable to lift up secondary to prior stroke and sensation not intact secondary to prior stroke.  However when I try to lift up the arm he reports some pain in the shoulder and the forearm.  No joint effusions. Neurologic: Prior deficits including left arm weakness and sensation changes.  He is able to lift both legs up off the bed.  No slurred speech, no facial droop, cranial nerves appear intact otherwise.  Wife is at bedside who states that his neuro exam is at his baseline self. Skin:  Skin is warm, dry and intact. No rash noted. Psychiatric: Mood  and affect are normal. Speech and behavior are normal. GU: Deferred   ____________________________________________   LABS (all labs ordered are listed, but only abnormal results are displayed)  Labs Reviewed  CBC - Abnormal; Notable for the following components:      Result Value   WBC 3.6 (*)    All other components within normal limits  BASIC METABOLIC PANEL  URINALYSIS, COMPLETE (UACMP) WITH MICROSCOPIC  CBG MONITORING, ED   ____________________________________________   ED ECG REPORT I, Vanessa Benton, the attending physician, personally viewed and interpreted this ECG.  Normal sinus rate of 85, no ST elevation, no T wave versions, normal intervals ____________________________________________  RADIOLOGY Robert Bellow, personally viewed and evaluated these images (plain radiographs) as part of my medical decision making, as well as reviewing the written report by the radiologist.  ED MD interpretation: chronic fractures notes   Official radiology report(s): DG Scapula Left  Result Date: 02/19/2021 CLINICAL DATA:  Fall EXAM: LEFT FOREARM - 2 VIEW; LEFT SCAPULA - 2+ VIEWS; LEFT SHOULDER - 2+ VIEW COMPARISON:  CT chest, 02/16/2021 FINDINGS: Chronic fracture deformity of the scapular body. No acute appearing fracture. Multiple, chronic left-sided rib fractures. No acute fracture or dislocation of the left shoulder. Mild glenohumeral and acromioclavicular arthrosis. No fracture or dislocation of the left radius or ulna. IMPRESSION: 1. Chronic fracture deformity of the left scapular body. No acute appearing fracture. 2. No acute fracture or dislocation of the left shoulder. 3. No fracture or dislocation of the left radius or ulna. 4.  Multiple, chronic left-sided rib fractures. Electronically Signed   By: Eddie Candle M.D.   On: 02/19/2021 13:06   DG Forearm Left  Result Date: 02/19/2021 CLINICAL DATA:  Fall EXAM: LEFT FOREARM - 2 VIEW; LEFT SCAPULA - 2+ VIEWS; LEFT SHOULDER - 2+  VIEW COMPARISON:  CT chest, 02/16/2021 FINDINGS: Chronic fracture deformity of the scapular body. No acute appearing fracture. Multiple, chronic left-sided rib fractures. No acute fracture or dislocation of the left shoulder. Mild glenohumeral and acromioclavicular arthrosis. No fracture or dislocation of the left radius or ulna. IMPRESSION: 1. Chronic fracture deformity of the left scapular body. No acute appearing fracture. 2.  No acute fracture or dislocation of the left shoulder. 3. No fracture or dislocation of the left radius or ulna. 4.  Multiple, chronic left-sided rib fractures. Electronically Signed   By: Eddie Candle M.D.   On: 02/19/2021 13:06   CT HEAD WO CONTRAST (5MM)  Result Date: 02/19/2021 CLINICAL DATA:  Fall EXAM: CT HEAD WITHOUT CONTRAST CT CERVICAL SPINE WITHOUT CONTRAST TECHNIQUE: Multidetector CT imaging of the head and cervical spine was performed following the standard protocol without intravenous contrast. Multiplanar CT image reconstructions of the cervical spine were also generated. COMPARISON:  02/16/2021 FINDINGS: CT HEAD FINDINGS Brain: No evidence of acute infarction, hemorrhage, hydrocephalus, extra-axial collection or mass lesion/mass effect. Unchanged right MCA territory encephalomalacia. Vascular: No hyperdense vessel or unexpected calcification. Skull: Normal. Negative for fracture or focal lesion. Sinuses/Orbits: No acute finding. Other: None. CT CERVICAL SPINE FINDINGS Alignment: Normal. Skull base and vertebrae: No acute fracture. No primary bone lesion or focal pathologic process. Soft tissues and spinal canal: No prevertebral fluid or swelling. No visible canal hematoma. Disc levels: Mild multilevel disc space height loss and osteophytosis. Upper chest: Negative. Other: None. IMPRESSION: 1. No acute intracranial pathology. 2. Unchanged right MCA territory encephalomalacia. 3. No fracture or static subluxation of the cervical spine. Electronically Signed   By: Eddie Candle M.D.   On: 02/19/2021 12:16   CT Cervical Spine Wo Contrast  Result Date: 02/19/2021 CLINICAL DATA:  Fall EXAM: CT HEAD WITHOUT CONTRAST CT CERVICAL SPINE WITHOUT CONTRAST TECHNIQUE: Multidetector CT imaging of the head and cervical spine was performed following the standard protocol without intravenous contrast. Multiplanar CT image reconstructions of the cervical spine were also generated. COMPARISON:  02/16/2021 FINDINGS: CT HEAD FINDINGS Brain: No evidence of acute infarction, hemorrhage, hydrocephalus, extra-axial collection or mass lesion/mass effect. Unchanged right MCA territory encephalomalacia. Vascular: No hyperdense vessel or unexpected calcification. Skull: Normal. Negative for fracture or focal lesion. Sinuses/Orbits: No acute finding. Other: None. CT CERVICAL SPINE FINDINGS Alignment: Normal. Skull base and vertebrae: No acute fracture. No primary bone lesion or focal pathologic process. Soft tissues and spinal canal: No prevertebral fluid or swelling. No visible canal hematoma. Disc levels: Mild multilevel disc space height loss and osteophytosis. Upper chest: Negative. Other: None. IMPRESSION: 1. No acute intracranial pathology. 2. Unchanged right MCA territory encephalomalacia. 3. No fracture or static subluxation of the cervical spine. Electronically Signed   By: Eddie Candle M.D.   On: 02/19/2021 12:16   DG Chest Portable 1 View  Result Date: 02/19/2021 CLINICAL DATA:  Weakness EXAM: PORTABLE CHEST 1 VIEW COMPARISON:  02/16/2021 FINDINGS: Stable cardiomediastinal contours. Low lung volumes with mild bibasilar atelectasis. No new focal airspace opacity. No pleural effusion or pneumothorax. Numerous chronic left-sided rib fractures. IMPRESSION: Low lung volumes with mild bibasilar atelectasis. Electronically Signed   By: Davina Poke D.O.   On: 02/19/2021 13:00   DG Shoulder Left  Result Date: 02/19/2021 CLINICAL DATA:  Fall EXAM: LEFT FOREARM - 2 VIEW; LEFT SCAPULA - 2+  VIEWS; LEFT SHOULDER - 2+ VIEW COMPARISON:  CT chest, 02/16/2021 FINDINGS: Chronic fracture deformity of the scapular body. No acute appearing fracture. Multiple, chronic left-sided rib fractures. No acute fracture or dislocation of the left shoulder. Mild glenohumeral and acromioclavicular arthrosis. No fracture or dislocation of the left radius or ulna. IMPRESSION: 1. Chronic fracture deformity of the left scapular body. No acute appearing fracture. 2. No acute fracture or dislocation of the left shoulder. 3. No fracture or dislocation of the left radius or ulna.  4.  Multiple, chronic left-sided rib fractures. Electronically Signed   By: Eddie Candle M.D.   On: 02/19/2021 13:06    ____________________________________________   PROCEDURES  Procedure(s) performed (including Critical Care):  .1-3 Lead EKG Interpretation Performed by: Vanessa Wilson, MD Authorized by: Vanessa Lockbourne, MD     Interpretation: normal     ECG rate:  80s   ECG rate assessment: normal     Rhythm: sinus rhythm     Ectopy: none     Conduction: normal     ____________________________________________   INITIAL IMPRESSION / ASSESSMENT AND PLAN / ED COURSE  TERANCE POMPLUN was evaluated in Emergency Department on 02/19/2021 for the symptoms described in the history of present illness. He was evaluated in the context of the global COVID-19 pandemic, which necessitated consideration that the patient might be at risk for infection with the SARS-CoV-2 virus that causes COVID-19. Institutional protocols and algorithms that pertain to the evaluation of patients at risk for COVID-19 are in a state of rapid change based on information released by regulatory bodies including the CDC and federal and state organizations. These policies and algorithms were followed during the patient's care in the ED.    Patient is a 65 year old who comes in with concerns for fall.  Per the wife she thinks that he just lost his balance as he is not  normally able to bend over secondary to his prior stroke but they want to make sure there is nothing else going on.  Given this question may be loss of consciousness after having the fall we will get some EKG and cardiac markers to evaluate for ACS, keep on the cardiac monitor to evaluate for arrhythmia get labs to evaluate for Electra abnormalities, AKI.  Patient's not the best historian but states that he did not hit his head although it was not witnessed therefore we will just get some CTs to make sure no evidence of intercranial hemorrhage or cervical fracture.  X-rays ordered to evaluate for possible shoulder pain.  Patient is able to lift up both legs I have low suspicion for hip fracture.  At this time patient seems to be is neurological baseline I do not feel like he had a stroke.  Kidney function slightly elevated at 1.6 up from his baseline of 1.5.  We will get a little bit of a fluids.  His white count slightly low but he has no other infectious symptoms.  Patient's wife dumped out his urine but he had a urine done 3 days ago that was negative's and he denies any symptoms I doubt UTI  His cardiac markers are negative x2  His CT imaging and x-rays are all reassuring and I have low suspicion for an acute stroke at this time given he is at his mental baseline.  Patient's been ambulatory with the nurse without any weakness.  At this time I feel comfortable discharging patient.  With patient and his wife feel comfortable with this plan  I discussed the provisional nature of ED diagnosis, the treatment so far, the ongoing plan of care, follow up appointments and return precautions with the patient and any family or support people present. They expressed understanding and agreed with the plan, discharged home.           ____________________________________________   FINAL CLINICAL IMPRESSION(S) / ED DIAGNOSES   Final diagnoses:  Weakness  Fall, initial encounter      MEDICATIONS  GIVEN DURING THIS VISIT:  Medications  sodium chloride 0.9 % bolus 500 mL (0 mLs Intravenous Stopped 02/19/21 1213)     ED Discharge Orders     None        Note:  This document was prepared using Dragon voice recognition software and may include unintentional dictation errors.    Vanessa Crum, MD 02/19/21 1357

## 2021-03-01 DIAGNOSIS — G40909 Epilepsy, unspecified, not intractable, without status epilepticus: Secondary | ICD-10-CM | POA: Diagnosis not present

## 2021-03-01 DIAGNOSIS — Z0000 Encounter for general adult medical examination without abnormal findings: Secondary | ICD-10-CM | POA: Diagnosis not present

## 2021-03-01 DIAGNOSIS — Z1211 Encounter for screening for malignant neoplasm of colon: Secondary | ICD-10-CM | POA: Diagnosis not present

## 2021-03-01 DIAGNOSIS — F01518 Vascular dementia, unspecified severity, with other behavioral disturbance: Secondary | ICD-10-CM | POA: Diagnosis not present

## 2021-03-01 DIAGNOSIS — E1122 Type 2 diabetes mellitus with diabetic chronic kidney disease: Secondary | ICD-10-CM | POA: Diagnosis not present

## 2021-03-01 DIAGNOSIS — I129 Hypertensive chronic kidney disease with stage 1 through stage 4 chronic kidney disease, or unspecified chronic kidney disease: Secondary | ICD-10-CM | POA: Diagnosis not present

## 2021-03-01 DIAGNOSIS — Z794 Long term (current) use of insulin: Secondary | ICD-10-CM | POA: Diagnosis not present

## 2021-03-01 DIAGNOSIS — N183 Chronic kidney disease, stage 3 unspecified: Secondary | ICD-10-CM | POA: Diagnosis not present

## 2021-04-08 ENCOUNTER — Emergency Department
Admission: EM | Admit: 2021-04-08 | Discharge: 2021-04-08 | Disposition: A | Discharge status: Home / Self Care | Payer: Medicare HMO | Attending: Emergency Medicine | Admitting: Emergency Medicine

## 2021-04-08 ENCOUNTER — Encounter: Payer: Self-pay | Admitting: Emergency Medicine

## 2021-04-08 DIAGNOSIS — I129 Hypertensive chronic kidney disease with stage 1 through stage 4 chronic kidney disease, or unspecified chronic kidney disease: Secondary | ICD-10-CM | POA: Insufficient documentation

## 2021-04-08 DIAGNOSIS — E1122 Type 2 diabetes mellitus with diabetic chronic kidney disease: Secondary | ICD-10-CM | POA: Diagnosis not present

## 2021-04-08 DIAGNOSIS — Z7984 Long term (current) use of oral hypoglycemic drugs: Secondary | ICD-10-CM | POA: Diagnosis not present

## 2021-04-08 DIAGNOSIS — E1110 Type 2 diabetes mellitus with ketoacidosis without coma: Secondary | ICD-10-CM | POA: Diagnosis not present

## 2021-04-08 DIAGNOSIS — L299 Pruritus, unspecified: Secondary | ICD-10-CM | POA: Diagnosis present

## 2021-04-08 DIAGNOSIS — L301 Dyshidrosis [pompholyx]: Secondary | ICD-10-CM | POA: Diagnosis not present

## 2021-04-08 DIAGNOSIS — N1832 Chronic kidney disease, stage 3b: Secondary | ICD-10-CM | POA: Diagnosis not present

## 2021-04-08 DIAGNOSIS — Z87891 Personal history of nicotine dependence: Secondary | ICD-10-CM | POA: Diagnosis not present

## 2021-04-08 DIAGNOSIS — Z794 Long term (current) use of insulin: Secondary | ICD-10-CM | POA: Diagnosis not present

## 2021-04-08 LAB — CBC WITH DIFFERENTIAL/PLATELET
Abs Immature Granulocytes: 0.01 10*3/uL (ref 0.00–0.07)
Basophils Absolute: 0 10*3/uL (ref 0.0–0.1)
Basophils Relative: 1 %
Eosinophils Absolute: 0.3 10*3/uL (ref 0.0–0.5)
Eosinophils Relative: 5 %
HCT: 41.6 % (ref 39.0–52.0)
Hemoglobin: 13.3 g/dL (ref 13.0–17.0)
Immature Granulocytes: 0 %
Lymphocytes Relative: 31 %
Lymphs Abs: 1.6 10*3/uL (ref 0.7–4.0)
MCH: 26.2 pg (ref 26.0–34.0)
MCHC: 32 g/dL (ref 30.0–36.0)
MCV: 82.1 fL (ref 80.0–100.0)
Monocytes Absolute: 0.4 10*3/uL (ref 0.1–1.0)
Monocytes Relative: 8 %
Neutro Abs: 2.9 10*3/uL (ref 1.7–7.7)
Neutrophils Relative %: 55 %
Platelets: 179 10*3/uL (ref 150–400)
RBC: 5.07 MIL/uL (ref 4.22–5.81)
RDW: 14.9 % (ref 11.5–15.5)
WBC: 5.2 10*3/uL (ref 4.0–10.5)
nRBC: 0 % (ref 0.0–0.2)

## 2021-04-08 LAB — BASIC METABOLIC PANEL
Anion gap: 6 (ref 5–15)
BUN: 18 mg/dL (ref 8–23)
CO2: 23 mmol/L (ref 22–32)
Calcium: 9.1 mg/dL (ref 8.9–10.3)
Chloride: 110 mmol/L (ref 98–111)
Creatinine, Ser: 1.6 mg/dL — ABNORMAL HIGH (ref 0.61–1.24)
GFR, Estimated: 48 mL/min — ABNORMAL LOW (ref >60)
Glucose, Bld: 189 mg/dL — ABNORMAL HIGH (ref 70–99)
Potassium: 3.5 mmol/L (ref 3.5–5.1)
Sodium: 139 mmol/L (ref 135–145)

## 2021-04-08 NOTE — ED Provider Notes (Signed)
Emergency Medicine Provider Triage Evaluation Note  Daniel [name], a 65 y.o. male  was evaluated in triage.  Pt complains of insect bites to the right index finger.  Patient presents with concern of a possible spider bite to the right index finger.  He presents with onset of pain this morning.  He admits to seeing a spider crawling up his leg while in bed, and later noted pain to the proximal phalanx of the index finger.  He denies any chest pain, shortness breath, fever, chills, sweats but he does note some itching to the finger that he reports is spread to his other hand.  He denies any wound, cellulitis, abscess, or purulent drainage.  Review of Systems  Positive: Inset bite, right index finger pain Negative: CP, SOB  Physical Exam  BP 126/89   Pulse 95   Temp 98.3 F (36.8 C) (Oral)   Resp 16   SpO2 94%  Gen:   Awake, no distress  NAD Resp:  Normal effort CTA MSK:   Moves extremities without difficulty Right index finger with normal ROM Other:  Skin: no wound, cellulitis, abscess. Dyshidrotic skin changes noted  Medical Decision Making  Medically screening exam initiated at 6:36 PM.  Appropriate orders placed.  JUSTYCE BABY was informed that the remainder of the evaluation will be completed by another provider, this initial triage assessment does not replace that evaluation, and the importance of remaining in the ED until their evaluation is complete.  Diabetic patient with ED evaluation of right index finger pain with concern for insect bite.   Melvenia Needles, PA-C 04/08/21 1839    Carrie Mew, MD 04/09/21 [phone]

## 2021-04-08 NOTE — Discharge Instructions (Signed)
Your exam and labs are normal at this time.  No evidence of a serious or concerning spider bite to the hands.  You do have some skin changes consistent with a likely hand eczema.  Use over-the-counter hydrocortisone cream for itch relief.  Take Benadryl as needed for nighttime itch relief.  Follow with primary provider for ongoing symptoms.

## 2021-04-08 NOTE — ED Triage Notes (Signed)
Pt states he was bit by a spider this morning on right index finger.

## 2021-04-08 NOTE — ED Notes (Signed)
Dc ppw provided. Pt otc meds reviewed. Pt verbal consent for dc provided. Pt assisted off unit on foot with wife,.

## 2021-04-08 NOTE — ED Notes (Signed)
Pt presents today with R hand middle finger pain relating to possible brown recluse bite. Pt endorsing that they believe it was a brown recluse and positively identifies image when shown. Pt finger does not appear increasingly swollen. Pt endorsing "there were three holes where it bit me." Pt endorsing pain since the bite.

## 2021-04-08 NOTE — ED Provider Notes (Signed)
[facility] Emergency Department Provider Note ____________________________________________  Time seen: 2028  I have reviewed the triage vital signs and the nursing notes.  HISTORY  Chief Complaint  Insect Bite   HPI Daniel [name] is a 65 y.o. male with below medical history, presents to the ED with itching to the fingers of the right hand, with concern for a brown recluse spider bite.  Patient admits to seeing a small brown spider crawling in his bed, earlier this morning.  He subsequently felt or saw what he thought for 3 puncture wound type "fainting" bites to the radial aspect of his right middle finger.  Since that time he noted itching and tightness to the hand.  Patient denies any open wound, weeping, purulence, pustules, or abscess formation.  He also denies any fevers, chills, chest pain, or shortness of breath.  Patient does endorse multiple small papular bumps across the medial lateral sides of his fingers.  Past Medical History:  Diagnosis Date   Diabetes mellitus without complication ([place])    Hypertension    Pancreatitis    Seizures ([place])    Stroke [facility])    Stroke ([place]) 2016    Patient Active Problem List   Diagnosis Date Noted   Syncope 01/02/2020   Syncope, vasovagal 01/01/2020   Closed [place] fracture ([place]) 01/01/2020   History of CVA (cerebrovascular accident) 01/01/2020   CKD (chronic kidney disease), stage II    Recurrent seizures ([place]) 07/13/2019   Seizure disorder ([place]) 07/12/2019   HLD (hyperlipidemia) 07/12/2019   Type 2 diabetes mellitus with hyperlipidemia ([place]) 07/12/2019   CKD (chronic kidney disease), stage IIIb 07/12/2019   AKI (acute kidney injury) ([place]) 06/13/2019   Hyperkalemia 06/13/2019   Obesity (BMI 30-39.9) 06/13/2019   Depression 06/13/2019   BPH (benign prostatic hyperplasia) 06/13/2019   Peripheral neuropathy 06/13/2019   DKA (diabetic ketoacidoses) 26/37/8588   Acute metabolic encephalopathy 50/27/7412    Stroke ([place]) 10/27/2014   Lymphadenitis 04/09/2011   HTN (hypertension) 04/09/2011   Uncontrolled diabetes mellitus with complications ([place]) 87/86/7672    Past Surgical History:  Procedure Laterality Date   COLONOSCOPY WITH PROPOFOL N/A 10/07/2019   Procedure: COLONOSCOPY WITH PROPOFOL;  Surgeon: Toledo, Benay Pike, MD;  Location: [facility] ENDOSCOPY;  Service: Gastroenterology;  Laterality: N/A;   ESOPHAGOGASTRODUODENOSCOPY     ESOPHAGOGASTRODUODENOSCOPY (EGD) WITH PROPOFOL N/A 10/07/2019   Procedure: ESOPHAGOGASTRODUODENOSCOPY (EGD) WITH PROPOFOL;  Surgeon: Toledo, Benay Pike, MD;  Location: [facility] ENDOSCOPY;  Service: Gastroenterology;  Laterality: N/A;   TEE WITHOUT CARDIOVERSION N/A 11/01/2014   Procedure: TRANSESOPHAGEAL ECHOCARDIOGRAM (TEE);  Surgeon: Wellington Hampshire, MD;  Location: [facility] ORS;  Service: Cardiovascular;  Laterality: N/A;    Prior to Admission medications   Medication Sig Start Date End Date Taking? Authorizing Provider  atorvastatin (LIPITOR) 40 MG tablet Take 40 mg by mouth daily. 08/17/18   [provider]  blood glucose meter kit and supplies KIT Dispense based on patient and insurance preference. Use up to four times daily as directed. (FOR ICD-9 250.00, 250.01). 06/16/19   Annita Brod, MD  Docusate Sodium (DSS) 100 MG CAPS Take 100 mg by mouth in the morning and at bedtime. 08/04/19   [provider]  DULoxetine (CYMBALTA) 30 MG capsule Take 30 mg by mouth daily. Take with 60 mg 10/21/18   [provider]  DULoxetine (CYMBALTA) 60 MG capsule Take 60 mg by mouth daily. Takes with 30 mg 10/24/18   [provider]  gabapentin (NEURONTIN) 300  MG capsule Take 600 mg by mouth 3 (three) times daily.    [provider]  insulin glargine (LANTUS) 100 UNIT/ML injection Inject 0.25 mLs (25 Units total) into the skin at bedtime. 01/02/20   Loletha Grayer, MD  lamoTRIgine (LAMICTAL) 200 MG tablet Take 1 tablet (200 mg total) by mouth 2  (two) times daily. 07/13/19   Lavina Hamman, MD  metFORMIN (GLUCOPHAGE) 500 MG tablet Take 500 mg by mouth 2 (two) times daily. 06/22/19   [provider]  nortriptyline (PAMELOR) 50 MG capsule Take 50 mg by mouth at bedtime.    [provider]  tamsulosin (FLOMAX) 0.4 MG CAPS capsule Take 1 capsule (0.4 mg total) by mouth daily. 07/24/17   Hollice Espy, MD    Allergies Patient has no known allergies.  Family History  Problem Relation Age of Onset   CAD Sister    Diabetes Sister    Hypertension Sister    Diabetes Sister    Prostate cancer Father    Diabetes Mother    Bladder Cancer Neg Hx    Kidney cancer Neg Hx     Social History Social History   Tobacco Use   Smoking status: Former    Packs/day: 0.50    Years: 45.00    Pack years: 22.50    Types: Cigarettes    Quit date: 10/28/2014    Years since quitting: 6.4   Smokeless tobacco: Never  Vaping Use   Vaping Use: Never used  Substance Use Topics   Alcohol use: Never   Drug use: No    Review of Systems  Constitutional: Negative for fever. Eyes: Negative for visual changes. ENT: Negative for sore throat. Cardiovascular: Negative for chest pain. Respiratory: Negative for shortness of breath. Gastrointestinal: Negative for abdominal pain, vomiting and diarrhea. Genitourinary: Negative for dysuria. Musculoskeletal: Negative for back pain. Skin: Negative for rash.  Hand itching as above. Neurological: Negative for headaches, focal weakness or numbness. ____________________________________________  PHYSICAL EXAM:  VITAL SIGNS: ED Triage Vitals  Enc Vitals Group     BP 04/08/21 1827 126/89     Pulse Rate 04/08/21 1827 95     Resp 04/08/21 1827 16     Temp 04/08/21 1827 98.3 F (36.8 C)     Temp Source 04/08/21 1827 Oral     SpO2 04/08/21 1827 94 %     Weight --      Height --      Head Circumference --      Peak Flow --      Pain Score 04/08/21 1828 0     Pain Loc --      Pain Edu? --       Excl. in [place]? --     Constitutional: Alert and oriented. Well appearing and in no distress. Head: Normocephalic and atraumatic. Eyes: Conjunctivae are normal. Normal extraocular movements Cardiovascular: Normal rate, regular rhythm. Normal distal pulses. Respiratory: Normal respiratory effort. No wheezes/rales/rhonchi. Gastrointestinal: Soft and nontender. No distention. Musculoskeletal: Normal composite fist on the right.  Contractures noted on the left UE. Nontender with normal range of motion in all extremities.  Neurologic:  Normal gait without ataxia. Normal speech and language. No gross focal neurologic deficits are appreciated. Skin:  Skin is warm, dry and intact.  Patient with multiple small papules noted along the medial and lateral aspects of his fingers primarily on the right hand.  No skin changes in the area where he noted a spider bite, to indicate a local  cellulitis, abscess, or insect bite reaction. Psychiatric: Mood and affect are normal. Patient exhibits appropriate insight and judgment. ____________________________________________    {LABS (pertinent positives/negatives)  Labs Reviewed  BASIC METABOLIC PANEL - Abnormal; Notable for the following components:      Result Value   Glucose, Bld 189 (*)    Creatinine, Ser 1.60 (*)    GFR, Estimated 48 (*)    All other components within normal limits  CBC WITH DIFFERENTIAL/PLATELET  ____________________________________________  {EKG  ____________________________________________   RADIOLOGY Official radiology report(s): No results found. ____________________________________________  PROCEDURES  Procedures ____________________________________________   INITIAL IMPRESSION / ASSESSMENT AND PLAN / ED COURSE  As part of my medical decision making, I reviewed the following data within the [place] reviewed WNL and Notes from prior ED visits   DDX: hand eczema, contact dermatitis, insect  bite, cellulitis  Patient ED evaluation of itching to the right hand, concern for a brown recluse spider bite.  Patient presents to the ED for evaluation of itching as well as some papular bumps to the fingers of the right hand.  He did endorse seeing a spider in his bed, and assumed that spider bit him.  He has not had any skin changes to the middle finger of the hand since the incident some 10 to 12 hours prior.  No signs on evaluation of a quick developing necrotic cellulitis to the hand.  Patient likely has symptoms consistent with dyshidrotic eczema, he will be treated with over-the-counter antihistamines and will use hydrocortisone cream for symptom relief.  He is encouraged to follow-up with primary provider return to ED if necessary.  BENJIMAN SEDGWICK was evaluated in Emergency Department on 04/08/2021 for the symptoms described in the history of present illness. He was evaluated in the context of the global COVID-19 pandemic, which necessitated consideration that the patient might be at risk for infection with the SARS-CoV-2 virus that causes COVID-19. Institutional protocols and algorithms that pertain to the evaluation of patients at risk for COVID-19 are in a state of rapid change based on information released by regulatory bodies including the CDC and federal and state organizations. These policies and algorithms were followed during the patient's care in the ED. ____________________________________________  FINAL CLINICAL IMPRESSION(S) / ED DIAGNOSES  Final diagnoses:  Dyshidrotic eczema      Anarely Nicholls, Dannielle Karvonen, PA-C 04/08/21 2138    Carrie Mew, MD 04/09/21 [id number]

## 2021-04-08 NOTE — ED Notes (Signed)
Labs sent per PA request

## 2021-04-12 DIAGNOSIS — Z794 Long term (current) use of insulin: Secondary | ICD-10-CM | POA: Diagnosis not present

## 2021-04-12 DIAGNOSIS — Z09 Encounter for follow-up examination after completed treatment for conditions other than malignant neoplasm: Secondary | ICD-10-CM | POA: Diagnosis not present

## 2021-04-12 DIAGNOSIS — N183 Chronic kidney disease, stage 3 unspecified: Secondary | ICD-10-CM | POA: Diagnosis not present

## 2021-04-12 DIAGNOSIS — E1122 Type 2 diabetes mellitus with diabetic chronic kidney disease: Secondary | ICD-10-CM | POA: Diagnosis not present

## 2021-04-12 DIAGNOSIS — L301 Dyshidrosis [pompholyx]: Secondary | ICD-10-CM | POA: Diagnosis not present

## 2021-04-19 DIAGNOSIS — F01518 Vascular dementia, unspecified severity, with other behavioral disturbance: Secondary | ICD-10-CM | POA: Diagnosis not present

## 2021-04-19 DIAGNOSIS — Z86010 Personal history of colonic polyps: Secondary | ICD-10-CM | POA: Diagnosis not present

## 2021-04-19 DIAGNOSIS — K5909 Other constipation: Secondary | ICD-10-CM | POA: Diagnosis not present

## 2021-04-19 DIAGNOSIS — K625 Hemorrhage of anus and rectum: Secondary | ICD-10-CM | POA: Diagnosis not present

## 2021-04-19 DIAGNOSIS — R198 Other specified symptoms and signs involving the digestive system and abdomen: Secondary | ICD-10-CM | POA: Diagnosis not present

## 2021-05-22 ENCOUNTER — Emergency Department: Payer: Medicare HMO

## 2021-05-22 ENCOUNTER — Other Ambulatory Visit: Payer: Self-pay

## 2021-05-22 ENCOUNTER — Emergency Department
Admission: EM | Admit: 2021-05-22 | Discharge: 2021-05-22 | Disposition: A | Discharge status: Home / Self Care | Payer: Medicare HMO | Attending: Emergency Medicine | Admitting: Emergency Medicine

## 2021-05-22 ENCOUNTER — Encounter: Payer: Self-pay | Admitting: Radiology

## 2021-05-22 DIAGNOSIS — S2242XA Multiple fractures of ribs, left side, initial encounter for closed fracture: Secondary | ICD-10-CM | POA: Diagnosis not present

## 2021-05-22 DIAGNOSIS — R55 Syncope and collapse: Secondary | ICD-10-CM | POA: Diagnosis not present

## 2021-05-22 DIAGNOSIS — E1122 Type 2 diabetes mellitus with diabetic chronic kidney disease: Secondary | ICD-10-CM | POA: Diagnosis not present

## 2021-05-22 DIAGNOSIS — Z79899 Other long term (current) drug therapy: Secondary | ICD-10-CM | POA: Insufficient documentation

## 2021-05-22 DIAGNOSIS — Z87891 Personal history of nicotine dependence: Secondary | ICD-10-CM | POA: Insufficient documentation

## 2021-05-22 DIAGNOSIS — M958 Other specified acquired deformities of musculoskeletal system: Secondary | ICD-10-CM | POA: Diagnosis not present

## 2021-05-22 DIAGNOSIS — Z7984 Long term (current) use of oral hypoglycemic drugs: Secondary | ICD-10-CM | POA: Diagnosis not present

## 2021-05-22 DIAGNOSIS — R000 Tachycardia, unspecified: Secondary | ICD-10-CM | POA: Diagnosis not present

## 2021-05-22 DIAGNOSIS — R404 Transient alteration of awareness: Secondary | ICD-10-CM | POA: Diagnosis not present

## 2021-05-22 DIAGNOSIS — E1140 Type 2 diabetes mellitus with diabetic neuropathy, unspecified: Secondary | ICD-10-CM | POA: Insufficient documentation

## 2021-05-22 DIAGNOSIS — R410 Disorientation, unspecified: Secondary | ICD-10-CM | POA: Diagnosis not present

## 2021-05-22 DIAGNOSIS — M19012 Primary osteoarthritis, left shoulder: Secondary | ICD-10-CM | POA: Diagnosis not present

## 2021-05-22 DIAGNOSIS — I129 Hypertensive chronic kidney disease with stage 1 through stage 4 chronic kidney disease, or unspecified chronic kidney disease: Secondary | ICD-10-CM | POA: Insufficient documentation

## 2021-05-22 DIAGNOSIS — R0902 Hypoxemia: Secondary | ICD-10-CM | POA: Diagnosis not present

## 2021-05-22 DIAGNOSIS — Z794 Long term (current) use of insulin: Secondary | ICD-10-CM | POA: Diagnosis not present

## 2021-05-22 DIAGNOSIS — N1832 Chronic kidney disease, stage 3b: Secondary | ICD-10-CM | POA: Insufficient documentation

## 2021-05-22 DIAGNOSIS — R569 Unspecified convulsions: Secondary | ICD-10-CM | POA: Diagnosis not present

## 2021-05-22 DIAGNOSIS — G40909 Epilepsy, unspecified, not intractable, without status epilepticus: Secondary | ICD-10-CM | POA: Diagnosis not present

## 2021-05-22 DIAGNOSIS — R4020 Unspecified coma: Secondary | ICD-10-CM | POA: Diagnosis not present

## 2021-05-22 LAB — COMPREHENSIVE METABOLIC PANEL
ALT: 49 U/L — ABNORMAL HIGH (ref 0–44)
AST: 34 U/L (ref 15–41)
Albumin: 4.1 g/dL (ref 3.5–5.0)
Alkaline Phosphatase: 88 U/L (ref 38–126)
Anion gap: 7 (ref 5–15)
BUN: 15 mg/dL (ref 8–23)
CO2: 23 mmol/L (ref 22–32)
Calcium: 9.1 mg/dL (ref 8.9–10.3)
Chloride: 111 mmol/L (ref 98–111)
Creatinine, Ser: 1.74 mg/dL — ABNORMAL HIGH (ref 0.61–1.24)
GFR, Estimated: 43 mL/min — ABNORMAL LOW (ref >60)
Glucose, Bld: 208 mg/dL — ABNORMAL HIGH (ref 70–99)
Potassium: 4.1 mmol/L (ref 3.5–5.1)
Sodium: 141 mmol/L (ref 135–145)
Total Bilirubin: 1 mg/dL (ref 0.3–1.2)
Total Protein: 7.7 g/dL (ref 6.5–8.1)

## 2021-05-22 LAB — URINE DRUG SCREEN, QUALITATIVE (ARMC ONLY)
Amphetamines, Ur Screen: NOT DETECTED
Barbiturates, Ur Screen: NOT DETECTED
Benzodiazepine, Ur Scrn: POSITIVE — AB
Cannabinoid 50 Ng, Ur ~~LOC~~: NOT DETECTED
Cocaine Metabolite,Ur ~~LOC~~: NOT DETECTED
MDMA (Ecstasy)Ur Screen: NOT DETECTED
Methadone Scn, Ur: NOT DETECTED
Opiate, Ur Screen: NOT DETECTED
Phencyclidine (PCP) Ur S: NOT DETECTED
Tricyclic, Ur Screen: NOT DETECTED

## 2021-05-22 LAB — CBC WITH DIFFERENTIAL/PLATELET
Abs Immature Granulocytes: 0.05 10*3/uL (ref 0.00–0.07)
Basophils Absolute: 0.1 10*3/uL (ref 0.0–0.1)
Basophils Relative: 1 %
Eosinophils Absolute: 0.4 10*3/uL (ref 0.0–0.5)
Eosinophils Relative: 4 %
HCT: 48.3 % (ref 39.0–52.0)
Hemoglobin: 13.9 g/dL (ref 13.0–17.0)
Immature Granulocytes: 1 %
Lymphocytes Relative: 52 %
Lymphs Abs: 4.6 10*3/uL — ABNORMAL HIGH (ref 0.7–4.0)
MCH: 25.4 pg — ABNORMAL LOW (ref 26.0–34.0)
MCHC: 28.8 g/dL — ABNORMAL LOW (ref 30.0–36.0)
MCV: 88.1 fL (ref 80.0–100.0)
Monocytes Absolute: 0.9 10*3/uL (ref 0.1–1.0)
Monocytes Relative: 10 %
Neutro Abs: 2.8 10*3/uL (ref 1.7–7.7)
Neutrophils Relative %: 32 %
Platelets: 221 10*3/uL (ref 150–400)
RBC: 5.48 MIL/uL (ref 4.22–5.81)
RDW: 14.4 % (ref 11.5–15.5)
WBC: 8.7 10*3/uL (ref 4.0–10.5)
nRBC: 0 % (ref 0.0–0.2)

## 2021-05-22 LAB — ETHANOL: Alcohol, Ethyl (B): <10 mg/dL (ref <10)

## 2021-05-22 LAB — CBG MONITORING, ED: Glucose-Capillary: 197 mg/dL — ABNORMAL HIGH (ref 70–99)

## 2021-05-22 MED ORDER — LEVETIRACETAM IN NACL 1500 MG/100ML IV SOLN
1500.0000 mg | Freq: Once | INTRAVENOUS | Status: AC
  Administered 2021-05-22: 06:00:00 1500 mg via INTRAVENOUS
  Filled 2021-05-22: qty 100

## 2021-05-22 MED ORDER — ACETAMINOPHEN 325 MG PO TABS
650.0000 mg | ORAL_TABLET | Freq: Once | ORAL | Status: AC
  Administered 2021-05-22: 11:00:00 650 mg via ORAL
  Filled 2021-05-22: qty 2

## 2021-05-22 NOTE — ED Notes (Signed)
Pt wife came out of room and sts that pt wants to go home. RN entered pt room to find pt sleeping leaned up side rail of bed. RN asked wife the pt sts that he wants to go home. Wife woke pt up and sts " Josph, you want to go home?" Pt responds with "yes." RN advised pt and wife of risks and benefits to include worsening of condition and up to the possibility of death. Pt and wife verbalized understanding. Pt wife was able to get pt dressed by herself with minimal assistance from the pt. RN advised to pt and wife that if pt is not able to get out of bed by himself and dress himself that I, the RN, would not advise he leave. Pt wife sts  " just go get me a wheelchair" RN asked wife if pt normally takes care of himself at home and can walk and she responded yes he can, just do what I ask please. RN got pt a wheelchair and pushed pt to the front door. RN advised provider of such findings and events.

## 2021-05-22 NOTE — ED Triage Notes (Signed)
Patient arrived via EMS from home after his wife witnessed a generalized tonic-clonic seizure. Patient does have a HX of seizures. Per EMS, patient had a second seizure in route on the ambulance lasting around 45 seconds. Versed 2mg  given by EMS and O2/2L via nasal capnography cannula placed on patient due to medication given. BGL per EMS was 218. Patient also has a HX of CVA with left sided deficits.  On arrival, Patient is post-ictal and only responds to painful stimuli.

## 2021-05-22 NOTE — ED Notes (Signed)
Dr. Alfred Levins at the bedside speaking with patient's wife.

## 2021-05-22 NOTE — Discharge Instructions (Signed)
Seizures may happen at any time. It is important to take certain precautions to maintain your safety.   Follow up with your doctor in 1-3 days.  If you were started on a seizure medication, take it as prescribed.  During a seizure, a person may injure himself or herself. Seizure precautions are guidelines that a person can follow in order to minimize injury during a seizure. For any activity, it is important to ask, "What would happen if I had a seizure while doing this?" Follow the below precautions.   Bathroom Safety  A person with seizures may want to shower instead of bathe to avoid accidental drowning. If falls occur during the patient's typical seizure, a person should use a shower seat, preferably one with a safety strap.  Use nonskid strips in your shower or tub.  Never use electrical equipment near water. This prevents accidental electrocution.  Consider changing glass in shower doors to shatterproof glass.  Risk analyst If possible, cook when someone else is nearby.  Use the back burners of the stove to prevent accidental burns.  Use shatterproof containers as much as possible. For instance, sauces can be transferred from glass bottles to plastic containers for use.  Limit time that is required using knives or other sharp objects. If possible, buy foods that are already cut, or ask someone to help in meal preparation.   General Safety at [place] not smoke or light fires in the fireplace unless someone else is present.  Do not use space heaters that can be accidentally overturned.  When alone, avoid using step stools or ladders, and do not clean rooftop gutters.  Purchase power tools and motorized Company secretary which have a safety switch that will stop the machine if you release the handle (a 'dead man's' switch).   Driving and Transportation DO NOT [place] and/or you have permission to drive from your state's Department of Motor Vehicles   [facility]). Each state has different laws. Please refer to the following link on the [place] website for more information: http://www.epilepsyfoundation.org/answerplace/Social/driving/drivingu.cfm  If you ride a bicycle, wear a helmet and any other necessary protective gear.  When taking public transportation like the bus or subway, stay clear of the platform edge.   Outdoor Insurance underwriter is okay, but does present certain risks. Never swim alone, and tell friends what to do if you have a seizure while swimming.  Wear appropriate protective equipment.  Ski with a friend. If a seizure occurs, your friend can seek help, if needed. He or she can also help to get you out of the cold. Consider using a safety hook or belt while riding the ski lift.

## 2021-05-22 NOTE — ED Notes (Signed)
Dietary services near pt room, pt wife comes out and asks if there is a tray for her husband, RN asked dietary and they sts that there is not. RN to call dietary and get tray.

## 2021-05-22 NOTE — ED Notes (Signed)
Pharmacy contacted regarding [place].

## 2021-05-22 NOTE — ED Notes (Signed)
Patient return from CT

## 2021-05-22 NOTE — ED Notes (Signed)
Port CXR performed 

## 2021-05-22 NOTE — ED Notes (Signed)
Lab called stating that the CMP was "hemolyzed". Current order discontinued and new order placed. Will redraw when patient returns from CT. Dr. Alfred Levins notified and aware.

## 2021-05-22 NOTE — ED Notes (Signed)
Pt requesting something to eat. MD is okay with this. Pt is not able to help with getting him self on the bed. Where wife reports that he is usually up and walking at home.

## 2021-05-22 NOTE — ED Notes (Signed)
RN called and talked to dietary about getting pt a lunch tray.

## 2021-05-22 NOTE — ED Notes (Signed)
Prior to being transported to CT, pt is now opening his eyes and following commands. He also recognizes his wife.

## 2021-05-22 NOTE — ED Provider Notes (Addendum)
° °[facility] °Emergency Department Provider Note ° °____________________________________________ ° °Time seen: Approximately 6:12 AM ° °I have reviewed the triage vital signs and the nursing notes. ° ° °HISTORY ° °Chief Complaint °Seizures ° °Level 5 caveat:  Portions of the history and physical were unable to be obtained due to post-ictal state ° ° °HPI °Daniel [name] is a 65 y.o. male with a history CVA with left sided deficits, epilepsy, medication noncompliance, diabetes, hypertension who presents from home after seizure.  According to EMS patient's wife witnessed a generalized tonic-clonic seizure.  When EMS arrived patient had a second seizure lasting about 45 seconds and received 2 mg of IV Versed.  Blood glucose of 218.  Wife reports that patient is noncompliant with his medications.  No trauma.  No recent illness per wife. ° °Past Medical History:  °Diagnosis Date  ° Diabetes mellitus without complication (HCC)   ° Hypertension   ° Pancreatitis   ° Seizures (HCC)   ° Stroke (HCC)   ° Stroke (HCC) 2016  ° ° °Patient Active Problem List  ° Diagnosis Date Noted  ° Syncope 01/02/2020  ° Syncope, vasovagal 01/01/2020  ° Closed [place] II fracture (HCC) 01/01/2020  ° History of CVA (cerebrovascular accident) 01/01/2020  ° CKD (chronic kidney disease), stage II   ° Recurrent seizures (HCC) 07/13/2019  ° Seizure disorder (HCC) 07/12/2019  ° HLD (hyperlipidemia) 07/12/2019  ° Type 2 diabetes mellitus with hyperlipidemia (HCC) 07/12/2019  ° CKD (chronic kidney disease), stage IIIb 07/12/2019  ° AKI (acute kidney injury) (HCC) 06/13/2019  ° Hyperkalemia 06/13/2019  ° Obesity (BMI 30-39.9) 06/13/2019  ° Depression 06/13/2019  ° BPH (benign prostatic hyperplasia) 06/13/2019  ° Peripheral neuropathy 06/13/2019  ° DKA (diabetic ketoacidoses) 06/12/2019  ° Acute metabolic encephalopathy 11/30/2018  ° Stroke (HCC) 10/27/2014  ° Lymphadenitis 04/09/2011  ° HTN (hypertension) 04/09/2011  °  Uncontrolled diabetes mellitus with complications (HCC) 04/09/2011  ° ° °Past Surgical History:  °Procedure Laterality Date  ° COLONOSCOPY WITH PROPOFOL N/A 10/07/2019  ° Procedure: COLONOSCOPY WITH PROPOFOL;  Surgeon: Toledo, Teodoro K, MD;  Location: [facility] ENDOSCOPY;  Service: Gastroenterology;  Laterality: N/A;  ° ESOPHAGOGASTRODUODENOSCOPY    ° ESOPHAGOGASTRODUODENOSCOPY (EGD) WITH PROPOFOL N/A 10/07/2019  ° Procedure: ESOPHAGOGASTRODUODENOSCOPY (EGD) WITH PROPOFOL;  Surgeon: Toledo, Teodoro K, MD;  Location: [facility] ENDOSCOPY;  Service: Gastroenterology;  Laterality: N/A;  ° TEE WITHOUT CARDIOVERSION N/A 11/01/2014  ° Procedure: TRANSESOPHAGEAL ECHOCARDIOGRAM (TEE);  Surgeon: Muhammad A Arida, MD;  Location: [facility] ORS;  Service: Cardiovascular;  Laterality: N/A;  ° ° °Prior to Admission medications   °Medication Sig Start Date End Date Taking? Authorizing Provider  °atorvastatin (LIPITOR) 40 MG tablet Take 40 mg by mouth daily. 08/17/18   [provider]  °blood glucose meter kit and supplies KIT Dispense based on patient and insurance preference. Use up to four times daily as directed. (FOR ICD-9 250.00, 250.01). 06/16/19   Krishnan, Sendil K, MD  °Docusate Sodium (DSS) 100 MG CAPS Take 100 mg by mouth in the morning and at bedtime. 08/04/19   [provider]  °DULoxetine (CYMBALTA) 30 MG capsule Take 30 mg by mouth daily. Take with 60 mg 10/21/18   [provider]  °DULoxetine (CYMBALTA) 60 MG capsule Take 60 mg by mouth daily. Takes with 30 mg 10/24/18   [provider]  °gabapentin (NEURONTIN) 300 MG capsule Take 600 mg by mouth 3 (three) times daily.    [provider]  °insulin glargine (LANTUS) 100   100 UNIT/ML injection Inject 0.25 mLs (25 Units total) into the skin at bedtime. 01/02/20   Loletha Grayer, MD  lamoTRIgine (LAMICTAL) 200 MG tablet Take 1 tablet (200 mg total) by mouth 2 (two) times daily. 07/13/19   Lavina Hamman, MD  metFORMIN (GLUCOPHAGE) 500 MG tablet Take  500 mg by mouth 2 (two) times daily. 06/22/19   [provider]  nortriptyline (PAMELOR) 50 MG capsule Take 50 mg by mouth at bedtime.    [provider]  tamsulosin (FLOMAX) 0.4 MG CAPS capsule Take 1 capsule (0.4 mg total) by mouth daily. 07/24/17   Hollice Espy, MD    Allergies Patient has no known allergies.  Family History  Problem Relation Age of Onset   CAD Sister    Diabetes Sister    Hypertension Sister    Diabetes Sister    Prostate cancer Father    Diabetes Mother    Bladder Cancer Neg Hx    Kidney cancer Neg Hx     Social History Social History   Tobacco Use   Smoking status: Former    Packs/day: 0.50    Years: 45.00    Pack years: 22.50    Types: Cigarettes    Quit date: 10/28/2014    Years since quitting: 6.5   Smokeless tobacco: Never  Vaping Use   Vaping Use: Never used  Substance Use Topics   Alcohol use: Never   Drug use: No    Review of Systems  Constitutional: Negative for fever. Neurological: + seizure  Level 5 caveat:  Portions of the history and physical were unable to be obtained due to post-ictal  ____________________________________________   PHYSICAL EXAM:  VITAL SIGNS: ED Triage Vitals  Enc Vitals Group     BP 05/22/21 0600 132/80     Pulse Rate 05/22/21 0600 (!) 117     Resp 05/22/21 0600 (!) 21     Temp 05/22/21 0600 98.1 F (36.7 C)     Temp Source 05/22/21 0600 Axillary     SpO2 05/22/21 0545 98 %     Weight 05/22/21 0603 233 lb (105.7 kg)     Height 05/22/21 0603 5' 6" (1.676 m)     Head Circumference --      Peak Flow --      Pain Score --      Pain Loc --      Pain Edu? --      Excl. in [place]? --     Constitutional: Sleeping, withdrawals from painful stimuli HEENT:      Head: Normocephalic and atraumatic.         Eyes: Conjunctivae are normal. Sclera is non-icteric. Pinpoint pupils      Mouth/Throat: Mucous membranes are moist.       Neck: Supple with no signs of meningismus. Cardiovascular:  Regular rate and rhythm. No murmurs, gallops, or rubs. 2+ symmetrical distal pulses are present in all extremities. No JVD. Respiratory: Normal respiratory effort. Lungs are clear to auscultation bilaterally.  Gastrointestinal: Soft, non distended with positive bowel sounds. No rebound or guarding. Musculoskeletal:  No edema, cyanosis, or erythema of extremities. Neurologic: snoring, post-ictal, moving all extremities to noxious stimuli Skin: Skin is warm, dry and intact. No rash noted. Psychiatric: Mood and affect are normal. Speech and behavior are normal.  ____________________________________________   LABS (all labs ordered are listed, but only abnormal results are displayed)  Labs Reviewed  CBC WITH DIFFERENTIAL/PLATELET - Abnormal; Notable for the following components:  Result Value   MCH 25.4 (*)    MCHC 28.8 (*)    Lymphs Abs 4.6 (*)    All other components within normal limits  CBG MONITORING, ED - Abnormal; Notable for the following components:   Glucose-Capillary 197 (*)    All other components within normal limits  ETHANOL  URINE DRUG SCREEN, QUALITATIVE ([facility] ONLY)  COMPREHENSIVE METABOLIC PANEL   ____________________________________________  EKG   ED ECG REPORT I, Rudene Re, the attending physician, personally viewed and interpreted this ECG.  Sinus tachycardia with frequent PACs, rate of 124, normal intervals, no ST elevations or depressions ____________________________________________  RADIOLOGY  I have personally reviewed the images performed during this visit and I agree with the Radiologist's read.   Interpretation by Radiologist:  CT HEAD WO CONTRAST (5MM)  Result Date: 05/22/2021 CLINICAL DATA:  Seizure EXAM: CT HEAD WITHOUT CONTRAST TECHNIQUE: Contiguous axial images were obtained from the base of the skull through the vertex without intravenous contrast. COMPARISON:  02/19/2021 FINDINGS: Brain: No evidence of acute infarction,  hemorrhage, hydrocephalus, extra-axial collection or mass lesion/mass effect. Encephalomalacic changes related to old right MCA distribution infarct. Mild subcortical white matter and periventricular small vessel ischemic changes. Vascular: No hyperdense vessel or unexpected calcification. Skull: Normal. Negative for fracture or focal lesion. Sinuses/Orbits: The visualized paranasal sinuses are essentially clear. The mastoid air cells are unopacified. Other: None. IMPRESSION: No evidence of acute intracranial abnormality. Old right MCA distribution infarct. Mild small vessel ischemic changes. Electronically Signed   By: Julian Hy M.D.   On: 05/22/2021 06:58   DG Chest Portable 1 View  Result Date: 05/22/2021 CLINICAL DATA:  65 year old male status post witness seizure. Hypoxia. EXAM: PORTABLE CHEST 1 VIEW COMPARISON:  Chest radiograph 02/19/2021 and earlier. FINDINGS: Portable AP upright view at 0612 hours. Continued low lung volumes. Mediastinal contours remain within normal limits. Multilevel chronic left lateral rib fractures. Allowing for portable technique the lungs are clear. Visualized tracheal air column is within normal limits. No pneumothorax or definite effusion. Paucity of bowel gas in the upper abdomen. No acute osseous abnormality identified. IMPRESSION: Chronically low lung volumes.  No acute cardiopulmonary abnormality. Electronically Signed   By: Genevie Ann M.D.   On: 05/22/2021 06:19     ____________________________________________   PROCEDURES  Procedure(s) performed:yes .1-3 Lead EKG Interpretation Performed by: Rudene Re, MD Authorized by: Rudene Re, MD     Interpretation: non-specific     ECG rate assessment: tachycardic     Rhythm: sinus tachycardia     Ectopy: PAC     Conduction: normal     Critical Care performed:  None ____________________________________________   INITIAL IMPRESSION / ASSESSMENT AND PLAN / ED COURSE  65 y.o. male with a  history CVA with left sided deficits, epilepsy, medication noncompliance, diabetes, hypertension who presents from home after seizure x 2.  Wife is now at bedside and reports that patient sometimes is noncompliant with his evening dose of lamotrigine.  She reports that he was in his usual state of health when he had the seizure, he had no medical complaints and has not been sick.  Patient had another seizure in route with EMS and received 2 mg of Versed.  Patient arrives postictal but after 30 minutes in the ED, he is already opening his eyes to his name, moving all extremities spontaneously.  Still not speaking or following commands but mental state is clearing.  Most likely seizure in the setting of medication noncompliance.  We will load with  Keppra.  We will check basic labs for any signs of significant dehydration, infection, electrolyte derangements.  We will get a head CT.  Patient placed on telemetry for close monitoring of cardiorespiratory status.  We will monitor his mental status closely.  Plan to reassess after labs and imaging. history gathered from patient's wife was at bedside.  Old medical records reviewed including last visit with his neurologist in April 2022  _________________________ 7:23 AM on 05/22/2021 ----------------------------------------- CT negative.  Chemistry panel pending.  We will continue to monitor until patient is fully back to baseline.  Care transferred to Dr. Quentin Cornwall.      _____________________________________________ Please note:  Patient was evaluated in Emergency Department today for the symptoms described in the history of present illness. Patient was evaluated in the context of the global COVID-19 pandemic, which necessitated consideration that the patient might be at risk for infection with the SARS-CoV-2 virus that causes COVID-19. Institutional protocols and algorithms that pertain to the evaluation of patients at risk for COVID-19 are in a state of rapid  change based on information released by regulatory bodies including the CDC and federal and state organizations. These policies and algorithms were followed during the patient's care in the ED.  Some ED evaluations and interventions may be delayed as a result of limited staffing during the pandemic.   [place] Controlled Substance Database was reviewed by me. ____________________________________________   FINAL CLINICAL IMPRESSION(S) / ED DIAGNOSES   Final diagnoses:  Seizure ([place])      NEW MEDICATIONS STARTED DURING THIS VISIT:  ED Discharge Orders     None        Note:  This document was prepared using Dragon voice recognition software and may include unintentional dictation errors.    Alfred Levins, [place], MD 05/22/21 [place], [place], MD 05/22/21 [phone]

## 2021-06-24 ENCOUNTER — Encounter: Payer: Self-pay | Admitting: Emergency Medicine

## 2021-06-24 ENCOUNTER — Other Ambulatory Visit: Payer: Self-pay

## 2021-06-24 ENCOUNTER — Emergency Department: Payer: Medicare HMO

## 2021-06-24 ENCOUNTER — Observation Stay
Admission: EM | Admit: 2021-06-24 | Discharge: 2021-06-26 | Disposition: A | Discharge status: Home / Self Care | Payer: Medicare HMO | Attending: Internal Medicine | Admitting: Internal Medicine

## 2021-06-24 DIAGNOSIS — R2689 Other abnormalities of gait and mobility: Secondary | ICD-10-CM | POA: Diagnosis not present

## 2021-06-24 DIAGNOSIS — R4182 Altered mental status, unspecified: Secondary | ICD-10-CM | POA: Diagnosis not present

## 2021-06-24 DIAGNOSIS — G40919 Epilepsy, unspecified, intractable, without status epilepticus: Secondary | ICD-10-CM

## 2021-06-24 DIAGNOSIS — Z87891 Personal history of nicotine dependence: Secondary | ICD-10-CM | POA: Insufficient documentation

## 2021-06-24 DIAGNOSIS — I69354 Hemiplegia and hemiparesis following cerebral infarction affecting left non-dominant side: Secondary | ICD-10-CM | POA: Insufficient documentation

## 2021-06-24 DIAGNOSIS — E785 Hyperlipidemia, unspecified: Secondary | ICD-10-CM | POA: Diagnosis not present

## 2021-06-24 DIAGNOSIS — R404 Transient alteration of awareness: Secondary | ICD-10-CM | POA: Diagnosis not present

## 2021-06-24 DIAGNOSIS — Z794 Long term (current) use of insulin: Secondary | ICD-10-CM | POA: Insufficient documentation

## 2021-06-24 DIAGNOSIS — Z7984 Long term (current) use of oral hypoglycemic drugs: Secondary | ICD-10-CM | POA: Insufficient documentation

## 2021-06-24 DIAGNOSIS — Z8673 Personal history of transient ischemic attack (TIA), and cerebral infarction without residual deficits: Secondary | ICD-10-CM

## 2021-06-24 DIAGNOSIS — I10 Essential (primary) hypertension: Secondary | ICD-10-CM | POA: Diagnosis not present

## 2021-06-24 DIAGNOSIS — G40909 Epilepsy, unspecified, not intractable, without status epilepticus: Secondary | ICD-10-CM | POA: Diagnosis not present

## 2021-06-24 DIAGNOSIS — E119 Type 2 diabetes mellitus without complications: Secondary | ICD-10-CM | POA: Diagnosis not present

## 2021-06-24 DIAGNOSIS — Z20822 Contact with and (suspected) exposure to covid-19: Secondary | ICD-10-CM | POA: Insufficient documentation

## 2021-06-24 DIAGNOSIS — Z79899 Other long term (current) drug therapy: Secondary | ICD-10-CM | POA: Insufficient documentation

## 2021-06-24 DIAGNOSIS — R569 Unspecified convulsions: Secondary | ICD-10-CM | POA: Diagnosis not present

## 2021-06-24 DIAGNOSIS — R0689 Other abnormalities of breathing: Secondary | ICD-10-CM | POA: Diagnosis not present

## 2021-06-24 DIAGNOSIS — R000 Tachycardia, unspecified: Secondary | ICD-10-CM | POA: Diagnosis not present

## 2021-06-24 LAB — CBC WITH DIFFERENTIAL/PLATELET
Abs Immature Granulocytes: 0.02 10*3/uL (ref 0.00–0.07)
Basophils Absolute: 0.1 10*3/uL (ref 0.0–0.1)
Basophils Relative: 1 %
Eosinophils Absolute: 0.5 10*3/uL (ref 0.0–0.5)
Eosinophils Relative: 6 %
HCT: 45.6 % (ref 39.0–52.0)
Hemoglobin: 14.1 g/dL (ref 13.0–17.0)
Immature Granulocytes: 0 %
Lymphocytes Relative: 49 %
Lymphs Abs: 3.6 10*3/uL (ref 0.7–4.0)
MCH: 26.7 pg (ref 26.0–34.0)
MCHC: 30.9 g/dL (ref 30.0–36.0)
MCV: 86.4 fL (ref 80.0–100.0)
Monocytes Absolute: 0.7 10*3/uL (ref 0.1–1.0)
Monocytes Relative: 10 %
Neutro Abs: 2.5 10*3/uL (ref 1.7–7.7)
Neutrophils Relative %: 34 %
Platelets: 197 10*3/uL (ref 150–400)
RBC: 5.28 MIL/uL (ref 4.22–5.81)
RDW: 14.6 % (ref 11.5–15.5)
WBC: 7.4 10*3/uL (ref 4.0–10.5)
nRBC: 0 % (ref 0.0–0.2)

## 2021-06-24 LAB — BASIC METABOLIC PANEL
Anion gap: 21 — ABNORMAL HIGH (ref 5–15)
BUN: 17 mg/dL (ref 8–23)
CO2: 14 mmol/L — ABNORMAL LOW (ref 22–32)
Calcium: 9.7 mg/dL (ref 8.9–10.3)
Chloride: 105 mmol/L (ref 98–111)
Creatinine, Ser: 1.85 mg/dL — ABNORMAL HIGH (ref 0.61–1.24)
GFR, Estimated: 40 mL/min — ABNORMAL LOW (ref >60)
Glucose, Bld: 149 mg/dL — ABNORMAL HIGH (ref 70–99)
Potassium: 4.1 mmol/L (ref 3.5–5.1)
Sodium: 140 mmol/L (ref 135–145)

## 2021-06-24 MED ORDER — ACETAMINOPHEN 650 MG RE SUPP: 650.0000 mg | Freq: Four times a day (QID) | RECTAL | Status: DC | PRN

## 2021-06-24 MED ORDER — BACLOFEN 10 MG PO TABS
10.0000 mg | ORAL_TABLET | Freq: Three times a day (TID) | ORAL | Status: DC
  Administered 2021-06-25 – 2021-06-26 (×5): 10 mg via ORAL
  Filled 2021-06-24 (×7): qty 1

## 2021-06-24 MED ORDER — INSULIN ASPART 100 UNIT/ML IJ SOLN
0.0000 [IU] | Freq: Three times a day (TID) | INTRAMUSCULAR | Status: DC
  Administered 2021-06-25: 1 [IU] via SUBCUTANEOUS
  Filled 2021-06-24: qty 1

## 2021-06-24 MED ORDER — ACETAMINOPHEN 325 MG PO TABS
650.0000 mg | ORAL_TABLET | Freq: Four times a day (QID) | ORAL | Status: DC | PRN
  Administered 2021-06-25 (×2): 650 mg via ORAL
  Filled 2021-06-24 (×3): qty 2

## 2021-06-24 MED ORDER — TAMSULOSIN HCL 0.4 MG PO CAPS
0.4000 mg | ORAL_CAPSULE | Freq: Every day | ORAL | Status: DC
  Administered 2021-06-25 – 2021-06-26 (×2): 0.4 mg via ORAL
  Filled 2021-06-24 (×2): qty 1

## 2021-06-24 MED ORDER — MAGNESIUM HYDROXIDE 400 MG/5ML PO SUSP
30.0000 mL | Freq: Every day | ORAL | Status: DC | PRN
  Filled 2021-06-24: qty 30

## 2021-06-24 MED ORDER — GABAPENTIN 300 MG PO CAPS
600.0000 mg | ORAL_CAPSULE | Freq: Three times a day (TID) | ORAL | Status: DC
  Administered 2021-06-25 – 2021-06-26 (×5): 600 mg via ORAL
  Filled 2021-06-24 (×5): qty 2

## 2021-06-24 MED ORDER — TRAZODONE HCL 50 MG PO TABS: 25.0000 mg | ORAL_TABLET | Freq: Every evening | ORAL | Status: DC | PRN

## 2021-06-24 MED ORDER — ONDANSETRON HCL 4 MG/2ML IJ SOLN: 4.0000 mg | Freq: Four times a day (QID) | INTRAMUSCULAR | Status: DC | PRN

## 2021-06-24 MED ORDER — SODIUM CHLORIDE 0.9 % IV SOLN: INTRAVENOUS | Status: DC

## 2021-06-24 MED ORDER — LAMOTRIGINE 100 MG PO TABS
200.0000 mg | ORAL_TABLET | Freq: Once | ORAL | Status: AC
  Administered 2021-06-24: 200 mg via ORAL
  Filled 2021-06-24: qty 2

## 2021-06-24 MED ORDER — LORAZEPAM 2 MG/ML IJ SOLN
INTRAMUSCULAR | Status: AC
  Filled 2021-06-24: qty 1

## 2021-06-24 MED ORDER — ENOXAPARIN SODIUM 60 MG/0.6ML IJ SOSY
0.5000 mg/kg | PREFILLED_SYRINGE | INTRAMUSCULAR | Status: DC
  Administered 2021-06-25 – 2021-06-26 (×2): 52.5 mg via SUBCUTANEOUS
  Filled 2021-06-24 (×2): qty 0.6

## 2021-06-24 MED ORDER — LEVETIRACETAM IN NACL 1500 MG/100ML IV SOLN
1500.0000 mg | Freq: Once | INTRAVENOUS | Status: AC
  Administered 2021-06-24: 1500 mg via INTRAVENOUS
  Filled 2021-06-24: qty 100

## 2021-06-24 MED ORDER — LAMOTRIGINE 100 MG PO TABS
200.0000 mg | ORAL_TABLET | Freq: Two times a day (BID) | ORAL | Status: DC
  Administered 2021-06-25 – 2021-06-26 (×3): 200 mg via ORAL
  Filled 2021-06-24 (×3): qty 2

## 2021-06-24 MED ORDER — DOCUSATE SODIUM 100 MG PO CAPS
100.0000 mg | ORAL_CAPSULE | Freq: Two times a day (BID) | ORAL | Status: DC
  Administered 2021-06-25 – 2021-06-26 (×4): 100 mg via ORAL
  Filled 2021-06-24 (×4): qty 1

## 2021-06-24 MED ORDER — LEVETIRACETAM 500 MG PO TABS
500.0000 mg | ORAL_TABLET | Freq: Two times a day (BID) | ORAL | Status: DC
  Administered 2021-06-25 – 2021-06-26 (×3): 500 mg via ORAL
  Filled 2021-06-24 (×3): qty 1

## 2021-06-24 MED ORDER — ATORVASTATIN CALCIUM 20 MG PO TABS
40.0000 mg | ORAL_TABLET | Freq: Every day | ORAL | Status: DC
  Administered 2021-06-25 – 2021-06-26 (×2): 40 mg via ORAL
  Filled 2021-06-24 (×2): qty 2

## 2021-06-24 MED ORDER — LORAZEPAM 2 MG/ML IJ SOLN: 1.0000 mg | INTRAMUSCULAR | Status: DC | PRN

## 2021-06-24 MED ORDER — LACTATED RINGERS IV BOLUS
1000.0000 mL | Freq: Once | INTRAVENOUS | Status: AC
  Administered 2021-06-24: 1000 mL via INTRAVENOUS

## 2021-06-24 MED ORDER — DULOXETINE HCL 30 MG PO CPEP
90.0000 mg | ORAL_CAPSULE | Freq: Every day | ORAL | Status: DC
  Administered 2021-06-25 – 2021-06-26 (×2): 90 mg via ORAL
  Filled 2021-06-24 (×2): qty 3

## 2021-06-24 MED ORDER — ONDANSETRON HCL 4 MG PO TABS: 4.0000 mg | ORAL_TABLET | Freq: Four times a day (QID) | ORAL | Status: DC | PRN

## 2021-06-24 MED ORDER — INSULIN GLARGINE-YFGN 100 UNIT/ML ~~LOC~~ SOLN
25.0000 [IU] | Freq: Every day | SUBCUTANEOUS | Status: DC
  Administered 2021-06-25 (×2): 25 [IU] via SUBCUTANEOUS
  Filled 2021-06-24 (×3): qty 0.25

## 2021-06-24 NOTE — H&P (Signed)
Daniel [name]   PATIENT NAME: Daniel [name]    MR#:  [id number]  DATE OF BIRTH:  [birth year]  DATE OF ADMISSION:  06/24/2021  PRIMARY CARE PHYSICIAN: Daniel Ruths, Daniel [name]   Patient is coming from: Home  REQUESTING/REFERRING PHYSICIAN: Blake Divine, Daniel [name]  CHIEF COMPLAINT:   Chief Complaint  Patient presents with   Seizures    HISTORY OF PRESENT ILLNESS:  Daniel [name] is a 66 y.o. African-American male with medical history significant for type 2 diabetes mellitus, hypertension, seizure disorder and CVA with residual left-sided weakness, who presented to the ER with acute onset of seizure at home and another episode with EMS and in route to the hospital.  Patient was postictal initially when he arrived to the ER.  His wife then noticed  a generalized tonic-clonic seizure in the ER that lasted a couple of minutes.  He was later awake and alert and ambulatory upon arrival of EMS.  He is seizure episode with EMS lasted about 3 minutes.  He was given 2 mg of IV Versed with resolution and remained postictal.  During his seizures he had no tongue bite, urinary or stool incontinence.  He has been compliant with his medications lately.  His wife stated that he took his morning dose of Lamictal.  No chest pain or dyspnea or cough or wheezing.  No nausea or vomiting or abdominal pain.  No fever or chills.  No dysuria, oliguria or hematuria or flank pain.  ED Course: When he came to the ER, heart rate was 108 and otherwise vital signs were within normal.  Labs revealed a creatinine of 1.85 compared to 1.74 on 05/22/2021.  CBC was within normal. EKG as reviewed by me : EKG showed mild sinus tachycardia with a rate of 108 with borderline prolonged QT interval with a QTC of 492 MS. Imaging: Noncontrasted head CT scan revealed a stable old large right MCA distribution infarct and mild chronic small vessel disease with no acute intracranial abnormality.  The patient was given 200 mg of p.o.  Lamictal as well as loaded with 1500 mg IV Keppra as well as 1 L bolus of IV lactated [place].  He will be admitted to an observation medical telemetry bed for further evaluation and management. PAST MEDICAL HISTORY:   Past Medical History:  Diagnosis Date   Diabetes mellitus without complication (Daniel [name])    Hypertension    Pancreatitis    Seizures (Daniel [name])    Stroke Daniel [name])    Stroke (Daniel [name]) 2016    PAST SURGICAL HISTORY:   Past Surgical History:  Procedure Laterality Date   COLONOSCOPY WITH PROPOFOL N/A 10/07/2019   Procedure: COLONOSCOPY WITH PROPOFOL;  Surgeon: Daniel [name];  Location: [facility] ENDOSCOPY;  Service: Gastroenterology;  Laterality: N/A;   ESOPHAGOGASTRODUODENOSCOPY     ESOPHAGOGASTRODUODENOSCOPY (EGD) WITH PROPOFOL N/A 10/07/2019   Procedure: ESOPHAGOGASTRODUODENOSCOPY (EGD) WITH PROPOFOL;  Surgeon: Daniel [name];  Location: [facility] ENDOSCOPY;  Service: Gastroenterology;  Laterality: N/A;   TEE WITHOUT CARDIOVERSION N/A 11/01/2014   Procedure: TRANSESOPHAGEAL ECHOCARDIOGRAM (TEE);  Surgeon: Daniel Hampshire, Daniel [name];  Location: [facility] ORS;  Service: Cardiovascular;  Laterality: N/A;    SOCIAL HISTORY:   Social History   Tobacco Use   Smoking status: Former    Packs/day: 0.50    Years: 45.00    Pack years: 22.50    Types: Cigarettes    Quit date: 10/28/2014    Years since quitting: 6.6   Smokeless tobacco: Never  Substance Use Topics   Alcohol use: Never    FAMILY HISTORY:   Family History  Problem Relation Age of Onset   CAD Sister    Diabetes Sister    Hypertension Sister    Diabetes Sister    Prostate cancer Father    Diabetes Mother    Bladder Cancer Neg Hx    Kidney cancer Neg Hx     DRUG ALLERGIES:  No Known Allergies  REVIEW OF SYSTEMS:   ROS As per history of present illness. All pertinent systems were reviewed above. Constitutional, HEENT, cardiovascular, respiratory, GI, GU, musculoskeletal, neuro, psychiatric, endocrine, integumentary and  hematologic systems were reviewed and are otherwise negative/unremarkable except for positive findings mentioned above in the HPI.   MEDICATIONS AT HOME:   Prior to Admission medications   Medication Sig Start Date End Date Taking? Authorizing Provider  atorvastatin (LIPITOR) 40 MG tablet Take 40 mg by mouth daily. 08/17/18   Daniel [name]  baclofen (LIORESAL) 10 MG tablet Take 10 mg by mouth 3 (three) times daily. 04/01/21   Daniel [name]  blood glucose meter kit and supplies KIT Dispense based on patient and insurance preference. Use up to four times daily as directed. (FOR ICD-9 250.00, 250.01). 06/16/19   Daniel Brod, Daniel [name]  Docusate Sodium (DSS) 100 MG CAPS Take 100 mg by mouth in the morning and at bedtime. 08/04/19   Daniel [name]  DULoxetine (CYMBALTA) 30 MG capsule Take 30 mg by mouth daily. Take with 60 mg 10/21/18   Daniel [name]  DULoxetine (CYMBALTA) 60 MG capsule Take 60 mg by mouth daily. Takes with 30 mg 10/24/18   Daniel [name]  gabapentin (NEURONTIN) 300 MG capsule Take 600 mg by mouth 3 (three) times daily.    Daniel [name]  insulin glargine (LANTUS) 100 UNIT/ML injection Inject 0.25 mLs (25 Units total) into the skin at bedtime. 01/02/20   Daniel Grayer, Daniel [name]  lamoTRIgine (LAMICTAL) 200 MG tablet Take 1 tablet (200 mg total) by mouth 2 (two) times daily. 07/13/19   Daniel Hamman, Daniel [name]  metFORMIN (GLUCOPHAGE) 500 MG tablet Take 500 mg by mouth 2 (two) times daily. 06/22/19   Daniel [name]  nortriptyline (PAMELOR) 50 MG capsule Take 50 mg by mouth at bedtime. Patient not taking: Reported on 05/22/2021    Daniel [name]  tamsulosin (FLOMAX) 0.4 MG CAPS capsule Take 1 capsule (0.4 mg total) by mouth daily. 07/24/17   Daniel Espy, Daniel [name]      VITAL SIGNS:  Blood pressure (!) 140/95, pulse 79, temperature 98.9 F (37.2 C), resp. rate 14, height '5\' 6"'  (1.676 m), weight 105.7 kg, SpO2 99  %.  PHYSICAL EXAMINATION:  Physical Exam  GENERAL:  66 y.o.-year-old African-American male patient lying in the bed with no acute distress.  EYES: Pupils equal, round, reactive to light and accommodation. No scleral icterus. Extraocular muscles intact.  HEENT: Head atraumatic, normocephalic. Oropharynx and nasopharynx clear.  NECK:  Supple, no jugular venous distention. No thyroid enlargement, no tenderness.  LUNGS: Normal breath sounds bilaterally, no wheezing, rales,rhonchi or crepitation. No use of accessory muscles of respiration.  CARDIOVASCULAR: Regular rate and rhythm, S1, S2 normal. No murmurs, rubs, or gallops.  ABDOMEN: Soft, nondistended, nontender. Bowel sounds present. No organomegaly or mass.  EXTREMITIES: No pedal edema, cyanosis, or clubbing.  NEUROLOGIC: Cranial nerves II through XII are intact.  He has left-sided hemiplegia with left hand contractures. PSYCHIATRIC: The patient is alert and oriented x 3.  Normal affect and good eye contact. SKIN: No obvious rash, lesion, or ulcer.   LABORATORY PANEL:   CBC Recent Labs  Lab 06/24/21 1937  WBC 7.4  HGB 14.1  HCT 45.6  PLT 197   ------------------------------------------------------------------------------------------------------------------  Chemistries  Recent Labs  Lab 06/24/21 1937  NA 140  K 4.1  CL 105  CO2 14*  GLUCOSE 149*  BUN 17  CREATININE 1.85*  CALCIUM 9.7   ------------------------------------------------------------------------------------------------------------------  Cardiac Enzymes No results for input(s): TROPONINI in the last 168 hours. ------------------------------------------------------------------------------------------------------------------  RADIOLOGY:  CT Head Wo Contrast  Result Date: 06/24/2021 CLINICAL DATA:  Altered mental status. Seizure. EXAM: CT HEAD WITHOUT CONTRAST TECHNIQUE: Contiguous axial images were obtained from the base of the skull through the vertex  without intravenous contrast. RADIATION DOSE REDUCTION: This exam was performed according to the departmental dose-optimization program which includes automated exposure control, adjustment of the mA and/or kV according to patient size and/or use of iterative reconstruction technique. COMPARISON:  05/22/2021 FINDINGS: Brain: No evidence of intracranial hemorrhage, acute infarction, hydrocephalus, extra-axial collection, or mass lesion/mass effect. Old large right MCA distribution infarct remains stable in appearance. Mild chronic small vessel disease is also unchanged in appearance. Vascular:  No hyperdense vessel or other acute findings. Skull: No evidence of fracture or other significant bone abnormality. Sinuses/Orbits:  No acute findings. Other: None. IMPRESSION: No acute intracranial abnormality. Stable old large right MCA distribution infarct, and mild chronic small vessel disease. Electronically Signed   By: Marlaine Hind M.D.   On: 06/24/2021 20:03      IMPRESSION AND PLAN:  Principal Problem:   Recurrent seizures ([place])  1.  Recurrent breakthrough seizures with history of seizure disorder. - The patient will be admitted to a medical telemetry bed. - We will continue Lamictal. - We will continue Keppra 500 mg p.o. twice daily as recommended. - Seizure precautions will be applied. - As needed IV Ativan was ordered. - Neuro checks will be obtained.  2.  Dyslipidemia. - We will continue statin therapy.  3.  Type 2 diabetes mellitus. - We will place the patient on supplement coverage with NovoLog. - We will continue basal coverage. - We will hold off metformin.  4.  BPH. - We will continue Flomax.  5.  History of CVA with left-sided hemiparesis. - We will continue baclofen.  DVT prophylaxis: Lovenox. Code Status: full code. Family Communication:  The plan of care was discussed in details with the patient (and family). I answered all questions. The patient agreed to proceed with the  above mentioned plan. Further management will depend upon hospital course. Disposition Plan: Back to previous home environment Consults called: Neurology.   All the records are reviewed and case discussed with ED provider.  Status is: Observation  I certify that at the time of admission, it is my clinical judgment that the patient will require inpatient hospital care extending less than 2 midnights.                            Dispo: The patient is from: Home              Anticipated d/c is to: Home              Patient currently is not medically stable to d/c.              Difficult to place patient: No  Christel Mormon M.D on 06/24/2021 at 10:48  PM  [facility]   From 7 PM-7 AM, contact night-coverage [email or web address]  CC: Primary care physician; Daniel Ruths, Daniel [name]

## 2021-06-24 NOTE — Progress Notes (Signed)
PHARMACIST - PHYSICIAN COMMUNICATION  CONCERNING:  Enoxaparin (Lovenox) for DVT Prophylaxis    RECOMMENDATION: Patient was prescribed enoxaprin 40mg  q24 hours for VTE prophylaxis.   Filed Weights   06/24/21 1933  Weight: 105.7 kg (233 lb 0.4 oz)    Body mass index is 37.61 kg/m.  Estimated Creatinine Clearance: 45.4 mL/min (A) (by C-G formula based on SCr of 1.85 mg/dL (H)).   Based on [place] patient is candidate for enoxaparin 0.5mg /kg TBW SQ every 24 hours based on BMI being >30.  DESCRIPTION: Pharmacy has adjusted enoxaparin dose per [facility] policy.  Patient is now receiving enoxaparin 0.5 mg/kg every 24 hours   Renda Rolls, PharmD, Operating Room Services 06/24/2021 11:09 PM

## 2021-06-24 NOTE — ED Notes (Signed)
Tele neuro assessment completed.

## 2021-06-24 NOTE — ED Notes (Signed)
Paged out teleneuro

## 2021-06-24 NOTE — ED Provider Notes (Addendum)
[facility] Provider Note    Event Date/Time   First MD Initiated Contact with Patient 06/24/21 1932     (approximate)   History   Chief Complaint Seizures   HPI  Daniel [name] is a 66 y.o. male with past medical history of hypertension, diabetes, stroke with left-sided deficits, and seizures who presents to the ED for seizure.  History is limited as patient arrives postictal and majority of history is obtained from EMS.  They report that they were called by patient's wife after he had a generalized tonic-clonic seizure lasting for at least a couple of minutes.  On their arrival to the home, patient was awake, alert, and ambulatory.  During transport, patient then had an additional tonic-clonic seizure lasting about 3 minutes.  He was given 2 mg of IV Versed with resolution of seizure, has remained postictal since then.  Wife had reported to EMS that patient had taken his morning dose of Lamictal but not yet his evening dose.  Per chart review, patient has a history of medication noncompliance.     Physical Exam   Triage Vital Signs: ED Triage Vitals [06/24/21 1933]  Enc Vitals Group     BP      Pulse      Resp      Temp      Temp src      SpO2      Weight 233 lb 0.4 oz (105.7 kg)     Height 5\' 6"  (1.676 m)     Head Circumference      Peak Flow      Pain Score 0     Pain Loc      Pain Edu?      Excl. in [place]?     Most recent vital signs: Vitals:   06/24/21 2130 06/24/21 2200  BP: (!) 144/99 (!) 140/95  Pulse: 84 79  Resp: 20 14  Temp:    SpO2: 96% 99%    Constitutional: Somnolent but arousable to voice, able to follow commands. Eyes: Conjunctivae are normal. Head: Atraumatic. Nose: No congestion/rhinnorhea. Mouth/Throat: Mucous membranes are moist.  Cardiovascular: Normal rate, regular rhythm. Grossly normal heart sounds.  2+ radial pulses bilaterally. Respiratory: Normal respiratory effort.  No retractions. Lungs  CTAB. Gastrointestinal: Soft and nontender. No distention. Musculoskeletal: No lower extremity tenderness nor edema.  Neurologic:  Mumbling speech pattern.  Left-sided deficits noted, chronic per chart review.  Able to follow commands in right upper and lower extremity.    ED Results / Procedures / Treatments   Labs (all labs ordered are listed, but only abnormal results are displayed) Labs Reviewed  BASIC METABOLIC PANEL - Abnormal; Notable for the following components:      Result Value   CO2 14 (*)    Glucose, Bld 149 (*)    Creatinine, Ser 1.85 (*)    GFR, Estimated 40 (*)    Anion gap 21 (*)    All other components within normal limits  RESP PANEL BY RT-PCR (FLU A&B, COVID) [id number]  CBC WITH DIFFERENTIAL/PLATELET  LAMOTRIGINE LEVEL     EKG  ED ECG REPORT I, Blake Divine, the attending physician, personally viewed and interpreted this ECG.   Date: 06/24/2021  EKG Time: 19:39  Rate: 108  Rhythm: sinus tachycardia  Axis: Normal  Intervals: Borderline prolonged QT  ST&T Change: None  RADIOLOGY CT head reviewed by me with no hemorrhage or midline shift.  PROCEDURES:  Critical Care performed: No  Procedures   MEDICATIONS ORDERED IN ED: Medications  LORazepam (ATIVAN) 2 MG/ML injection (  Not Given 06/24/21 2047)  lamoTRIgine (LAMICTAL) tablet 200 mg (200 mg Oral Given 06/24/21 1941)  levETIRAcetam (KEPPRA) IVPB 1500 mg/ 100 mL premix (0 mg Intravenous Stopped 06/24/21 2145)  lactated ringers bolus 1,000 mL (0 mLs Intravenous Stopped 06/24/21 2145)     IMPRESSION / MDM / ASSESSMENT AND PLAN / ED COURSE  I reviewed the triage vital signs and the nursing notes.                              66 y.o. male with past medical history of hypertension, diabetes, stroke with left-sided deficits, and seizures who presents to the ED following apparent seizure at home with another generalized seizure with EMS lasting for approximately 3 minutes before he was given 2 mg IV  Versed.  Differential diagnosis includes, but is not limited to, breakthrough seizure, stroke, electrolyte abnormality, arrhythmia, medication noncompliance.  Patient arrives apparently postictal, somnolent but arousable to voice, able to follow commands on the right side but has minimal movement on the left consistent with his known deficits from prior stroke.  He appears to be gradually improving following his latest seizure, will attempt to give his oral dose of Lamictal as he continues to wake up but hold off on any IV loading medication.  We will screen labs and CT head given multiple seizures.  EKG shows no evidence of arrhythmia or ischemia.  CT head is negative for acute process, l CBC shows no anemia, BMP does show anion gap acidosis with normal glycemia.  I suspect this is due to elevated lactic acid from his multiple seizure episodes.  Unfortunately, patient had a third seizure here in the ED lasting for about 45 seconds and stopping before medication was required.  This occurred after he received dose of Lamictal and given patient reports recent compliance with medications, we will load with IV Keppra.  He was evaluated by tele-neurologist, who recommends continuing Keppra at 500 mg twice daily in addition to his usual Lamictal.  Lamictal level is pending at this time to help determine if patient has truly been compliant.  Neurology recommends admission to the hospital for observation and case discussed with hospitalist for admission.       FINAL CLINICAL IMPRESSION(S) / ED DIAGNOSES   Final diagnoses:  Breakthrough seizure ([place])     Rx / DC Orders   ED Discharge Orders     None        Note:  This document was prepared using Dragon voice recognition software and may include unintentional dictation errors.   Blake Divine, MD 06/24/21 3704    Blake Divine, MD 06/24/21 2242

## 2021-06-24 NOTE — ED Notes (Signed)
Registration came to get this RN due to pt having a seizure. Upon my arrival into room, pt having jerking movements in all extremities. Seizure lasted about a minute. RN to bedside with other RNs for assistance. Pt A&O immediately after seizure stopped. EDP aware.

## 2021-06-24 NOTE — ED Triage Notes (Signed)
Pt BIB EMS from home for seizures. Pt had seizure with EMS, was given 2mg  Versed IV. Pt was A&O x4 prior to seizure. Pt snoring but following commands when prompted.

## 2021-06-25 ENCOUNTER — Other Ambulatory Visit: Payer: Self-pay

## 2021-06-25 ENCOUNTER — Encounter: Payer: Self-pay | Admitting: Family Medicine

## 2021-06-25 DIAGNOSIS — G40909 Epilepsy, unspecified, not intractable, without status epilepticus: Secondary | ICD-10-CM | POA: Diagnosis not present

## 2021-06-25 LAB — CBC
HCT: 44 % (ref 39.0–52.0)
Hemoglobin: 13.6 g/dL (ref 13.0–17.0)
MCH: 26.2 pg (ref 26.0–34.0)
MCHC: 30.9 g/dL (ref 30.0–36.0)
MCV: 84.6 fL (ref 80.0–100.0)
Platelets: 176 10*3/uL (ref 150–400)
RBC: 5.2 MIL/uL (ref 4.22–5.81)
RDW: 14.5 % (ref 11.5–15.5)
WBC: 4.9 10*3/uL (ref 4.0–10.5)
nRBC: 0 % (ref 0.0–0.2)

## 2021-06-25 LAB — HIV ANTIBODY (ROUTINE TESTING W REFLEX): HIV Screen 4th Generation wRfx: NONREACTIVE

## 2021-06-25 LAB — RESP PANEL BY RT-PCR (FLU A&B, COVID) ARPGX2
Influenza A by PCR: NEGATIVE
Influenza B by PCR: NEGATIVE
SARS Coronavirus 2 by RT PCR: NEGATIVE

## 2021-06-25 LAB — BASIC METABOLIC PANEL
Anion gap: 7 (ref 5–15)
BUN: 15 mg/dL (ref 8–23)
CO2: 26 mmol/L (ref 22–32)
Calcium: 8.8 mg/dL — ABNORMAL LOW (ref 8.9–10.3)
Chloride: 108 mmol/L (ref 98–111)
Creatinine, Ser: 1.46 mg/dL — ABNORMAL HIGH (ref 0.61–1.24)
GFR, Estimated: 53 mL/min — ABNORMAL LOW (ref >60)
Glucose, Bld: 103 mg/dL — ABNORMAL HIGH (ref 70–99)
Potassium: 3.8 mmol/L (ref 3.5–5.1)
Sodium: 141 mmol/L (ref 135–145)

## 2021-06-25 LAB — CBG MONITORING, ED
Glucose-Capillary: 90 mg/dL (ref 70–99)
Glucose-Capillary: 99 mg/dL (ref 70–99)

## 2021-06-25 LAB — MAGNESIUM: Magnesium: 2.3 mg/dL (ref 1.7–2.4)

## 2021-06-25 LAB — GLUCOSE, CAPILLARY: Glucose-Capillary: 144 mg/dL — ABNORMAL HIGH (ref 70–99)

## 2021-06-25 MED ORDER — AMLODIPINE BESYLATE 10 MG PO TABS
10.0000 mg | ORAL_TABLET | Freq: Every day | ORAL | Status: DC
  Administered 2021-06-25 – 2021-06-26 (×2): 10 mg via ORAL
  Filled 2021-06-25: qty 2
  Filled 2021-06-25: qty 1

## 2021-06-25 MED ORDER — HYDRALAZINE HCL 20 MG/ML IJ SOLN
10.0000 mg | Freq: Four times a day (QID) | INTRAMUSCULAR | Status: DC | PRN
  Administered 2021-06-25: 10 mg via INTRAVENOUS
  Filled 2021-06-25: qty 1

## 2021-06-25 NOTE — ED Notes (Signed)
Wife at bedside- pt HOB raised

## 2021-06-25 NOTE — Care Management (Signed)
Patient sleeping on attempt to assess.  Asked for TOC to return.

## 2021-06-25 NOTE — Progress Notes (Signed)
[place] [facility] [place] NAME: Pranav Lince    MR#:  [id number]  DATE OF BIRTH:  November 24, 1955  SUBJECTIVE:    REVIEW OF SYSTEMS:   ROS Tolerating Diet: Tolerating PT:   DRUG ALLERGIES:  No Known Allergies  VITALS:  Blood pressure (!) 155/95, pulse 77, temperature 98.1 F (36.7 C), resp. rate 18, height 5\' 6"  (1.676 m), weight 105.7 kg, SpO2 98 %.  PHYSICAL EXAMINATION:   Physical Exam  GENERAL:  66 y.o.-year-old patient lying in the bed with no acute distress.  HEENT: Head atraumatic, normocephalic. Oropharynx and nasopharynx clear.  LUNGS: Normal breath sounds bilaterally, no wheezing, rales, rhonchi.  CARDIOVASCULAR: S1, S2 normal. No murmurs, rubs, or gallops.  ABDOMEN: Soft, nontender, nondistended. Bowel sounds present.  EXTREMITIES: No  edema b/l.    NEUROLOGIC: nonfocal PSYCHIATRIC:  patient is alert and awake SKIN: No obvious rash, lesion, or ulcer.   LABORATORY PANEL:  CBC Recent Labs  Lab 06/25/21 0741  WBC 4.9  HGB 13.6  HCT 44.0  PLT 176    Chemistries  Recent Labs  Lab 06/25/21 0741  NA 141  K 3.8  CL 108  CO2 26  GLUCOSE 103*  BUN 15  CREATININE 1.46*  CALCIUM 8.8*   Cardiac Enzymes No results for input(s): TROPONINI in the last 168 hours. RADIOLOGY:  CT Head Wo Contrast  Result Date: 06/24/2021 CLINICAL DATA:  Altered mental status. Seizure. EXAM: CT HEAD WITHOUT CONTRAST TECHNIQUE: Contiguous axial images were obtained from the base of the skull through the vertex without intravenous contrast. RADIATION DOSE REDUCTION: This exam was performed according to the departmental dose-optimization program which includes automated exposure control, adjustment of the mA and/or kV according to patient size and/or use of iterative reconstruction technique. COMPARISON:  05/22/2021 FINDINGS: Brain: No evidence of intracranial hemorrhage, acute infarction, hydrocephalus, extra-axial collection, or mass lesion/mass  effect. Old large right MCA distribution infarct remains stable in appearance. Mild chronic small vessel disease is also unchanged in appearance. Vascular:  No hyperdense vessel or other acute findings. Skull: No evidence of fracture or other significant bone abnormality. Sinuses/Orbits:  No acute findings. Other: None. IMPRESSION: No acute intracranial abnormality. Stable old large right MCA distribution infarct, and mild chronic small vessel disease. Electronically Signed   By: Marlaine Hind M.D.   On: 06/24/2021 20:03   ASSESSMENT AND PLAN:  HIPOLITO MARTINEZLOPEZ is a 66 y.o. African-American male with medical history significant for type 2 diabetes mellitus, hypertension, seizure disorder and CVA with residual left-sided weakness, who presented to the ER with acute onset of seizure at home and another episode with EMS and in route to the hospital  Noncontrasted head CT scan revealed a stable old large right MCA distribution infarct and mild chronic small vessel disease with no acute intracranial abnormality.  Recurrent breakthrough seizure history of chronic seizure disorder -- patient today has been seizure free. -- Seen by tightly neurology added Keppra -- continue Lamictal -- seizure precaution -- EEG -- PRN Ativan  BPH -- continue Flomax   history of CVA with left upper extremity hemiparesis -- continue statins -- PRN baclofen for muscle spasm  history of hypertension -- on amlodipine -- PRN hydralazine  Procedures: Family communication : wife at bedside Consults : neurology CODE STATUS: full DVT Prophylaxis : Lovenox Level of care: Telemetry Medical Status is: Observation  The patient remains OBS appropriate and will d/c before 2 midnights.  If remains seizure for will discharged  tomorrow.     TOTAL TIME TAKING CARE OF THIS PATIENT: 25 minutes.  >50% time spent on counselling and coordination of care  Note: This dictation was prepared with Dragon dictation along with  smaller phrase technology. Any transcriptional errors that result from this process are unintentional.  Fritzi Mandes M.D    [facility]   CC: Primary care physician; Kirk Ruths, MD Patient ID: Franz Dell, male   DOB: 10-02-1955, 66 y.o.   MRN: [id number]

## 2021-06-25 NOTE — Consult Note (Addendum)
NEURO HOSPITALIST CONSULT NOTE   Requesting physician: Dr. Posey Pronto  Reason for Consult: Breakthrough seizure  History obtained from:   Patient and Chart     HPI:                                                                                                                                          Daniel [name] is an 66 y.o. male with a PMHx of seizure disorder, prior stroke with residual left sided weakness, DM2 and HTN who presented to the ER on Saturday evening with acute onset of seizure at home lasting for at least a couple of minutes. On EMS arrival to the home, the patient was awake, alert, and ambulatory. He had another seizure with EMS while en route to the hospital and was given 2 mg Versed IV; the second seizure lasted for 3 minutes. On arrival to the ED, he was postictal.   Wife had reported to EMS that patient had taken his morning dose of Lamictal but not yet his evening dose. The patient has a history of medication noncompliance.   While in the ED, the patient hd a 3rd seizure lasting for about 45 seconds and stopping before IV medication was required. This occurred after he received his evening dose of Lamictal. He was loaded with 1500 mg IV Keppra and started on 500 mg BID on the recommendations of Teleneurology. He has been continued on his home dosing regimen of Lamictal. Lamictal level was drawn.   Noncontrasted head CT scan revealed a stable old large right MCA distribution infarct and mild chronic small vessel disease with no acute intracranial abnormality.  Past Medical History:  Diagnosis Date   Diabetes mellitus without complication ([place])    Hypertension    Pancreatitis    Seizures ([place])    Stroke [facility])    Stroke ([place]) 2016    Past Surgical History:  Procedure Laterality Date   COLONOSCOPY WITH PROPOFOL N/A 10/07/2019   Procedure: COLONOSCOPY WITH PROPOFOL;  Surgeon: Toledo, Benay Pike, MD;  Location: [facility] ENDOSCOPY;  Service:  Gastroenterology;  Laterality: N/A;   ESOPHAGOGASTRODUODENOSCOPY     ESOPHAGOGASTRODUODENOSCOPY (EGD) WITH PROPOFOL N/A 10/07/2019   Procedure: ESOPHAGOGASTRODUODENOSCOPY (EGD) WITH PROPOFOL;  Surgeon: Toledo, Benay Pike, MD;  Location: [facility] ENDOSCOPY;  Service: Gastroenterology;  Laterality: N/A;   TEE WITHOUT CARDIOVERSION N/A 11/01/2014   Procedure: TRANSESOPHAGEAL ECHOCARDIOGRAM (TEE);  Surgeon: Wellington Hampshire, MD;  Location: [facility] ORS;  Service: Cardiovascular;  Laterality: N/A;    Family History  Problem Relation Age of Onset   CAD Sister    Diabetes Sister    Hypertension Sister    Diabetes Sister    Prostate cancer Father    Diabetes Mother    Bladder Cancer Neg Hx    Kidney  cancer Neg Hx              Social History:  reports that he quit smoking about 6 years ago. His smoking use included cigarettes. He has a 22.50 pack-year smoking history. He has never used smokeless tobacco. He reports that he does not drink alcohol and does not use drugs.  No Known Allergies  MEDICATIONS:                                                                                                                     Prior to Admission:  Medications Prior to Admission  Medication Sig Dispense Refill Last Dose   atorvastatin (LIPITOR) 40 MG tablet Take 40 mg by mouth daily.   06/24/2021   baclofen (LIORESAL) 10 MG tablet Take 10 mg by mouth 3 (three) times daily.   06/24/2021   blood glucose meter kit and supplies KIT Dispense based on patient and insurance preference. Use up to four times daily as directed. (FOR ICD-9 250.00, 250.01). 1 each 0 Past Week   Docusate Sodium (DSS) 100 MG CAPS Take 100 mg by mouth in the morning and at bedtime.   06/24/2021   DULoxetine (CYMBALTA) 30 MG capsule Take 30 mg by mouth daily. Take with 60 mg   06/24/2021   DULoxetine (CYMBALTA) 60 MG capsule Take 60 mg by mouth daily. Takes with 30 mg   06/24/2021   gabapentin (NEURONTIN) 300 MG capsule Take 600 mg by mouth 3 (three)  times daily.   06/24/2021   insulin glargine (LANTUS) 100 UNIT/ML injection Inject 0.25 mLs (25 Units total) into the skin at bedtime.   06/23/2021   lamoTRIgine (LAMICTAL) 200 MG tablet Take 1 tablet (200 mg total) by mouth 2 (two) times daily. 60 tablet 0 06/24/2021   metFORMIN (GLUCOPHAGE) 500 MG tablet Take 500 mg by mouth 2 (two) times daily.   06/24/2021   nortriptyline (PAMELOR) 50 MG capsule Take 50 mg by mouth at bedtime.   06/23/2021   tamsulosin (FLOMAX) 0.4 MG CAPS capsule Take 1 capsule (0.4 mg total) by mouth daily. 30 capsule 11 06/24/2021   Scheduled:  amLODipine  10 mg Oral Daily   atorvastatin  40 mg Oral Daily   baclofen  10 mg Oral TID   docusate sodium  100 mg Oral BID   DULoxetine  90 mg Oral Daily   enoxaparin (LOVENOX) injection  0.5 mg/kg Subcutaneous Q24H   gabapentin  600 mg Oral TID   insulin aspart  0-9 Units Subcutaneous TID AC & HS   insulin glargine-yfgn  25 Units Subcutaneous QHS   lamoTRIgine  200 mg Oral BID   levETIRAcetam  500 mg Oral BID   tamsulosin  0.4 mg Oral Daily   Continuous:   ROS:  No CP, SOB, N/V, F/C, abdominal pain, dysuria or flank pain. Other ROS as per HPI.    Blood pressure (!) 122/91, pulse 83, temperature 98.3 F (36.8 C), resp. rate 15, height '5\' 6"'  (1.676 m), weight 105.7 kg, SpO2 97 %.   General Examination:                                                                                                       Physical Exam  HEENT-  [place]/AT   Lungs- Respirations unlabored Extremities- Warm and well perfused  Neurological Examination Mental Status: Awake and alert. Mood euthymic. Speech fluent with intact comprehension and naming. Able to follow all commands. Misses the day and month slightly when asked. States that it is "2025". Oriented to the city and state.  Cranial Nerves: II: Temporal visual  fields intact when tested individually, but there is extinction on the left to DSS. PERRL.   III,IV, VI: No ptosis. EOMI with saccadic pursuits noted.  V: Decreased FT sensation on the left.  VII: Slight lag on the left with smile.  VIII: Hearing intact to conversation.  IX,X: No hoarseness or hypophonia XI: Head is midline XII: Midline tongue extension Motor: RUE and RLE 5/5 LUE with increased flexor tone, worse in digits. Unable to open left hand. Proximal LUE strength is 2-3/5. LUE is bradykinetic LLE with slowed movement, 4/5 strength proximally and distally, increased tone.  Sensory: Decreased temp sensation on the left.  Deep Tendon Reflexes: Hypoactive on the left in the context of increased tone. Normoactive on the right.  Cerebellar: No ataxia with FNF on the right. Unable to perform on the left.  Gait: Unable to assess   Lab Results: Basic Metabolic Panel: Recent Labs  Lab 06/24/21 1937 06/25/21 0741  NA 140 141  K 4.1 3.8  CL 105 108  CO2 14* 26  GLUCOSE 149* 103*  BUN 17 15  CREATININE 1.85* 1.46*  CALCIUM 9.7 8.8*    CBC: Recent Labs  Lab 06/24/21 1937 06/25/21 0741  WBC 7.4 4.9  NEUTROABS 2.5  --   HGB 14.1 13.6  HCT 45.6 44.0  MCV 86.4 84.6  PLT 197 176    Cardiac Enzymes: No results for input(s): CKTOTAL, CKMB, CKMBINDEX, TROPONINI in the last 168 hours.  Lipid Panel: No results for input(s): CHOL, TRIG, HDL, CHOLHDL, VLDL, LDLCALC in the last 168 hours.  Imaging: CT Head Wo Contrast  Result Date: 06/24/2021 CLINICAL DATA:  Altered mental status. Seizure. EXAM: CT HEAD WITHOUT CONTRAST TECHNIQUE: Contiguous axial images were obtained from the base of the skull through the vertex without intravenous contrast. RADIATION DOSE REDUCTION: This exam was performed according to the departmental dose-optimization program which includes automated exposure control, adjustment of the mA and/or kV according to patient size and/or use of iterative  reconstruction technique. COMPARISON:  05/22/2021 FINDINGS: Brain: No evidence of intracranial hemorrhage, acute infarction, hydrocephalus, extra-axial collection, or mass lesion/mass effect. Old large right MCA distribution infarct remains stable in appearance. Mild chronic small vessel disease is also unchanged in appearance. Vascular:  No hyperdense vessel  or other acute findings. Skull: No evidence of fracture or other significant bone abnormality. Sinuses/Orbits:  No acute findings. Other: None. IMPRESSION: No acute intracranial abnormality. Stable old large right MCA distribution infarct, and mild chronic small vessel disease. Electronically Signed   By: Marlaine Hind M.D.   On: 06/24/2021 20:03     Assessment: 66 year old male with a history of seizures and large right MCA stroke, presenting to the ED with breakthrough seizures.  1. Exam reveals findings consistent with chronic right MCA ischemic infarction.  2. CT head with no acute intracranial abnormality.There is a stable old large right MCA distribution infarct and mild chronic small vessel disease. 3. Most likely etiology for the patient's breakthrough seizures is insufficient anticonvulsant effect of his home Lamictal dose. He is compliant with medications, which his wife administers to him. He states that she does not miss any of the scheduled dosing times. He is at the maximum dosing for Lamictal at home.   Recommendations: 1. Continue Lamictal 200 mg BID.  2. Continue Keppra 500 mg BID, which has been started this admission.  3. EEG in AM.  4. Lamictal level is pending 5. Inpatient seizure precautions.  6. Outpatient seizure precautions: Per [facility] statutes, patients with seizures are not allowed to drive until  they have been seizure-free for six months. Use caution when using heavy equipment or power tools. Avoid working on ladders or at heights. Take showers instead of baths. Ensure the water temperature is not too high  on the home water heater. Do not go swimming alone. When caring for infants or small children, sit down when holding, feeding, or changing them to minimize risk of injury to the child in the event you have a seizure. Also, Maintain good sleep hygiene. Avoid alcohol. 7. Magnesium level has been ordered.    Electronically signed: Dr. Kerney Elbe 06/25/2021, 9:36 PM

## 2021-06-25 NOTE — Consult Note (Signed)
[facility] TeleNeurology Consult Services  Stat Consult  Patient Name:   Daniel [name], Daniel [name] Date of Birth:   1955-11-26 Identification Number:   MRN - [id number] Date of Service:   06/24/2021 21:05:29  Diagnosis:       R56.9 - Seizures  Impression Daniel [name] is a 66 yo M w/ a hx of stroke and seizures who presents with spell concerning for seizure. On exam, patient is inattentive and has residual left sided weakness. Head CT showed no acute intracranial pathology. Patient has failed anti-epileptic monotherapy. Recommend the following: - start Keppra 500 mg bid for maintenance - check Lamictal level - infectious and metabolic eval level - MRI brain w/o contrast - EEG - PT/OT   CT HEAD: As Per Radiologist CT Head Showed No Acute Hemorrhage or Acute Core Infarct  Our recommendations are outlined below.  Laboratory Studies: Recommend Lipid panel Hemoglobin A1c  Nursing Recommendations: Telemetry, IV Fluids, avoid dextrose containing fluids, Maintain euglycemia Neuro checks q4 hrs x 24 hrs and then per shift Head of bed 30 degrees  Consultations: Physical therapy/Occupational therapy   Imaging Head CT: no acute intracranial pathology  Metrics: [facility] Notification Time: 06/24/2021 21:03:39 Stamp Time: 06/24/2021 21:05:29 Callback Response Time: 06/24/2021 21:07:41   ----------------------------------------------------------------------------------------------------  Chief Complaint: spell concerning for seizure  History of Present Illness: Patient is a 66 year old Male. Daniel [name] is a 66 yo M w/ a hx of stroke and seizures who presents with spell concerning for seizure. The patient's wife reports episode of LOC with rhythmic jerking of extremities. Initial episode lasted 2 min. Had another spell in route and another in the ED (that one only lasting a few min). Patient has been having about one seizure every other month. Denies any issues with  compliance. On exam, patient is inattentive and has residual left sided weakness. Head CT showed no acute intracranial pathology.   Past Medical History:      Stroke      Seizures  Medications:  No Anticoagulant use  No Antiplatelet use Reviewed EMR for current medications  Allergies:  Reviewed  Social History: Smoking: No Alcohol Use: No Drug Use: No  Family History:  There is no family history of premature cerebrovascular disease pertinent to this consultation  ROS : 14 Points Review of Systems was performed and was negative except mentioned in HPI.  Past Surgical History: There Is No Surgical History Contributory To Todays Visit   Examination: BP(140/95), Pulse(79), Blood Glucose(149)  Neuro Exam:  General: Drowsy, disoriented, inattentive  Speech: Fluent:  Language: Intact:  Facial Sensation: Intact:  Extraocular Movements: Intact:  Motor Exam: Drift:  Sensation: Intact:  Coordination: Intact:     Patient / Family was informed the Neurology Consult would occur via TeleHealth consult by way of interactive audio and video telecommunications and consented to receiving care in this manner.  Patient is being evaluated for possible acute neurologic impairment and high probability of imminent or life - threatening deterioration.I spent total of 35 minutes providing care to this patient, including time for face to face visit via telemedicine, review of medical records, imaging studies and discussion of findings with providers, the patient and / or family.   Dr Abbe [place]   [facility] [phone]  Case [id number]

## 2021-06-25 NOTE — ED Notes (Signed)
Pt given a sandwich tray and a diet soda.

## 2021-06-25 NOTE — ED Notes (Signed)
Lab at bedside

## 2021-06-26 DIAGNOSIS — G40909 Epilepsy, unspecified, not intractable, without status epilepticus: Secondary | ICD-10-CM | POA: Diagnosis not present

## 2021-06-26 LAB — GLUCOSE, CAPILLARY
Glucose-Capillary: 76 mg/dL (ref 70–99)
Glucose-Capillary: 84 mg/dL (ref 70–99)

## 2021-06-26 LAB — LAMOTRIGINE LEVEL: Lamotrigine Lvl: 2.2 ug/mL (ref 2.0–20.0)

## 2021-06-26 LAB — HEMOGLOBIN A1C
Hgb A1c MFr Bld: 6.7 % — ABNORMAL HIGH (ref 4.8–5.6)
Mean Plasma Glucose: 146 mg/dL

## 2021-06-26 MED ORDER — LEVETIRACETAM 500 MG PO TABS: 500.0000 mg | ORAL_TABLET | Freq: Two times a day (BID) | ORAL | 2 refills | Status: AC

## 2021-06-26 MED ORDER — AMLODIPINE BESYLATE 10 MG PO TABS: 10.0000 mg | ORAL_TABLET | Freq: Every day | ORAL | 0 refills | Status: AC

## 2021-06-26 MED ORDER — NORTRIPTYLINE HCL 50 MG PO CAPS
50.0000 mg | ORAL_CAPSULE | Freq: Every evening | ORAL | 0 refills | Status: AC | PRN
Start: 2021-06-26 — End: ?

## 2021-06-26 NOTE — TOC Initial Note (Signed)
Transition of Care [facility]) - Initial/Assessment Note    Patient Details  Name: Daniel [name] MRN: [id number] Date of Birth: 1955-12-19  Transition of Care [facility]) CM/SW Contact:    Pete Pelt, RN Phone Number: 06/26/2021, 1:15 PM  Clinical Narrative:     Patient lives at home with his wife.  He states that his wife can assist if needed.  No concerns about transportation, he states he is up to date with MD appointments.    Patient states he orders his medications and has no concerns with taking medications as directed.  Importance of medication adherence discussed, verbal understanding voiced by patient.  Patient does not currently have home health, and there is no recommended follow up by PT.              Expected Discharge Plan: Home/Self Care Barriers to Discharge: Continued Medical Work up   Patient Goals and CMS Choice     Choice offered to / list presented to : NA  Expected Discharge Plan and Services Expected Discharge Plan: Home/Self Care   Discharge Planning Services: CM Consult   Living arrangements for the past 2 months: Single Family Home                                      Prior Living Arrangements/Services Living arrangements for the past 2 months: Single Family Home Lives with:: Self, Spouse Patient language and need for interpreter reviewed:: Yes (no interpreter required) Do you feel safe going back to the place where you live?: Yes      Need for Family Participation in Patient Care: Yes (Comment) Care giver support system in place?: Yes (comment)   Criminal Activity/Legal Involvement Pertinent to Current Situation/Hospitalization: No - Comment as needed  Activities of Daily Living Home Assistive Devices/Equipment: Cane (specify quad or straight) ADL Screening (condition at time of admission) Patient's cognitive ability adequate to safely complete daily activities?: Yes Is the patient deaf or have difficulty hearing?: No Does the patient  have difficulty seeing, even when wearing glasses/contacts?: No Does the patient have difficulty concentrating, remembering, or making decisions?: No Patient able to express need for assistance with ADLs?: Yes Does the patient have difficulty dressing or bathing?: No Independently performs ADLs?: Yes (appropriate for developmental age) Does the patient have difficulty walking or climbing stairs?: No Weakness of Legs: None Weakness of Arms/Hands: Left  Permission Sought/Granted Permission sought to share information with : Case Manager Permission granted to share information with : Yes, Verbal Permission Granted              Emotional Assessment Appearance:: Appears stated age Attitude/Demeanor/Rapport: Gracious, Engaged Affect (typically observed): Pleasant, Appropriate Orientation: : Oriented to Self, Oriented to Place, Oriented to  Time, Oriented to Situation Alcohol / Substance Use: Not Applicable Psych Involvement: No (comment)  Admission diagnosis:  Recurrent seizures ([place]) [G40.909] Breakthrough seizure ([place]) [G40.919] Patient Active Problem List   Diagnosis Date Noted   Syncope 01/02/2020   Syncope, vasovagal 01/01/2020   Closed [place] II fracture ([place]) 01/01/2020   History of CVA (cerebrovascular accident) 01/01/2020   CKD (chronic kidney disease), stage II    Recurrent seizures ([place]) 07/13/2019   Seizure disorder ([place]) 07/12/2019   HLD (hyperlipidemia) 07/12/2019   Type 2 diabetes mellitus with hyperlipidemia ([place]) 07/12/2019   CKD (chronic kidney disease), stage IIIb 07/12/2019   AKI (acute kidney injury) ([place]) 06/13/2019  Hyperkalemia 06/13/2019   Obesity (BMI 30-39.9) 06/13/2019   Depression 06/13/2019   BPH (benign prostatic hyperplasia) 06/13/2019   Peripheral neuropathy 06/13/2019   DKA (diabetic ketoacidoses) 83/29/1916   Acute metabolic encephalopathy 60/60/0459   Stroke ([place]) 10/27/2014   Lymphadenitis 04/09/2011   HTN (hypertension) 04/09/2011    Uncontrolled diabetes mellitus with complications ([place]) 97/74/1423   PCP:  Kirk Ruths, MD Pharmacy:   [facility] [address] ([address]), [place] - [place]) [place] [address] Phone: [phone] Fax: [phone]     Social Determinants of Health (SDOH) Interventions    Readmission Risk Interventions No flowsheet data found.

## 2021-06-26 NOTE — Evaluation (Signed)
Physical Therapy Evaluation Patient Details Name: NORIS KULINSKI MRN: [id number] DOB: 1955-12-07 Today's Date: 06/26/2021  History of Present Illness  GILMORE LIST is an 66 y.o. male with a PMHx of seizure disorder, prior stroke with residual left sided weakness, DM2 and HTN who presented to the ER on Saturday evening with acute onset of seizure at home lasting for at least a couple of minutes. On EMS arrival to the home, the patient was awake, alert, and ambulatory. He had another seizure with EMS while en route to the hospital and was given 2 mg Versed IV; the second seizure lasted for 3 minutes. On arrival to the ED, he was postictal. Had 3rd seizure in ED.  Wife had reported to EMS that patient had taken his morning dose of Lamictal but not yet his evening dose. The patient has a history of medication noncompliance.  Clinical Impression  Patient received in bed, his wife is present at bedside. Patient agreeable to PT assessment. Patient is mod independent with bed mobility. Increased time/effort and use of bed rails. He is able to stand with min guard and ambulated in room with min guard/supervision and QC. Patient has some residual neglect on left from prior CVA, but mobility appears to be at baseline per patient and wife. He does not require PT follow up at this time and should be able to safely return home with wife assistance.        Recommendations for follow up therapy are one component of a multi-disciplinary discharge planning process, led by the attending physician.  Recommendations may be updated based on patient status, additional functional criteria and insurance authorization.  Follow Up Recommendations No PT follow up    Assistance Recommended at Discharge Intermittent Supervision/Assistance  Patient can return home with the following  Direct supervision/assist for medications management;Assistance with cooking/housework;Assist for transportation    Equipment  Recommendations    Recommendations for Other Services       Functional Status Assessment Patient has not had a recent decline in their functional status     Precautions / Restrictions Precautions Precautions: Fall Restrictions Weight Bearing Restrictions: No      Mobility  Bed Mobility Overal bed mobility: Modified Independent             General bed mobility comments: increased effort and use of bed rails    Transfers Overall transfer level: Needs assistance Equipment used: Quad cane Transfers: Sit to/from Stand Sit to Stand: Min guard                Ambulation/Gait Ambulation/Gait assistance: Counsellor (Feet): 50 Feet Assistive device: Quad cane Gait Pattern/deviations: Step-through pattern, Decreased step length - right, Decreased step length - left, Decreased stride length Gait velocity: decr     General Gait Details: patient appears to be at baseline, he does bump into furniture/walls on his left side due to neglect/numbness which places patient at increased fall risk.  Stairs            Wheelchair Mobility    Modified Rankin (Stroke Patients Only)       Balance Overall balance assessment: Modified Independent                                           Pertinent Vitals/Pain Pain Assessment Pain Assessment: No/denies pain    Home Living Family/patient expects to be  discharged to:: Private residence Living Arrangements: Spouse/significant other Available Help at Discharge: Family;Available 24 hours/day Type of Home: Apartment Home Access: Level entry       Home Layout: One level Home Equipment: Cane - quad      Prior Function Prior Level of Function : Independent/Modified Independent             Mobility Comments: patient reports he walkes about a mile 1/2 each day ADLs Comments: mod independent     Hand Dominance        Extremity/Trunk Assessment   Upper Extremity Assessment Upper  Extremity Assessment: LUE deficits/detail LUE Deficits / Details: flaccid from prior CVA    Lower Extremity Assessment Lower Extremity Assessment: Overall WFL for tasks assessed    Cervical / Trunk Assessment Cervical / Trunk Assessment: Normal  Communication   Communication: No difficulties  Cognition Arousal/Alertness: Awake/alert Behavior During Therapy: WFL for tasks assessed/performed Overall Cognitive Status: Within Functional Limits for tasks assessed                                          General Comments      Exercises     Assessment/Plan    PT Assessment Patient does not need any further PT services  PT Problem List Decreased mobility       PT Treatment Interventions Gait training    PT Goals (Current goals can be found in the Care Plan section)  Acute Rehab PT Goals Patient Stated Goal: to return home PT Goal Formulation: With patient/family Time For Goal Achievement: 06/26/21 Potential to Achieve Goals: Good    Frequency       Co-evaluation               AM-PAC PT "6 Clicks" Mobility  Outcome Measure Help needed turning from your back to your side while in a flat bed without using bedrails?: A Little Help needed moving from lying on your back to sitting on the side of a flat bed without using bedrails?: A Little Help needed moving to and from a bed to a chair (including a wheelchair)?: A Little Help needed standing up from a chair using your arms (e.g., wheelchair or bedside chair)?: A Little Help needed to walk in hospital room?: A Little Help needed climbing 3-5 steps with a railing? : A Little 6 Click Score: 18    End of Session Equipment Utilized During Treatment: Gait belt Activity Tolerance: Patient tolerated treatment well Patient left: in chair;with call bell/phone within reach;with chair alarm set;with family/visitor present Nurse Communication: Mobility status      Time: 1127-1140 PT Time Calculation (min)  (ACUTE ONLY): 13 min   Charges:   PT Evaluation $PT Eval Moderate Complexity: 1 Mod          Skeeter Sheard, PT, GCS 06/26/21,11:50 AM

## 2021-06-26 NOTE — Discharge Summary (Addendum)
Daniel [name] NAME: Daniel [name]    MR#:  [id number]  DATE OF BIRTH:  1956-03-09  DATE OF ADMISSION:  06/24/2021 ADMITTING PHYSICIAN: Daniel Mormon, MD  DATE OF DISCHARGE: 06/26/2021  PRIMARY CARE PHYSICIAN: Daniel Ruths, MD    ADMISSION DIAGNOSIS:  Recurrent seizures ([place]) [G40.909] Breakthrough seizure ([place]) [G40.919]  DISCHARGE DIAGNOSIS:  Recurrent breakthrough seizures  SECONDARY DIAGNOSIS:   Past Medical History:  Diagnosis Date   Diabetes mellitus without complication ([place])    Hypertension    Pancreatitis    Seizures ([place])    Stroke [facility])    Stroke [facility]) 2016    HOSPITAL COURSE:  Daniel [name] is a 66 y.o. African-American male with medical history significant for type 2 diabetes mellitus, hypertension, seizure disorder and CVA with residual left-sided weakness, who presented to the ER with acute onset of seizure at home and another episode with EMS and in route to the hospital   Noncontrasted head CT scan revealed a stable old large right MCA distribution infarct and mild chronic small vessel disease with no acute intracranial abnormality.   Recurrent breakthrough seizure history of chronic seizure disorder -- patient today has been seizure free. -- Seen by Tele neurology/Daniel Daniel [name] added Keppra -- continue Lamictal -- seizure precaution -- EEG per Daniel [name] intermittent mild R focal slowing, expected given his known chronic infarct. No epileptiform abnormalities -- PRN Ativan -- no seizures reported in house.   BPH -- continue Flomax    history of CVA with left upper extremity hemiparesis -- continue statins -- PRN baclofen for muscle spasm   history of hypertension -- on amlodipine -- PRN hydralazine   patient ambulated with PT without issues. No PT follow-up.  Family communication : tried calling wife--unable to reach her  Consults : neurology CODE STATUS: full DVT Prophylaxis :  Lovenox Level of care: Telemetry Medical Status is: Observation   patient appears at baseline. Discharge to home.  CONSULTS OBTAINED:    DRUG ALLERGIES:  No Known Allergies  DISCHARGE MEDICATIONS:   Allergies as of 06/26/2021   No Known Allergies      Medication List     TAKE these medications    amLODipine 10 MG tablet Commonly known as: NORVASC Take 1 tablet (10 mg total) by mouth daily. Start taking on: June 27, 2021   atorvastatin 40 MG tablet Commonly known as: LIPITOR Take 40 mg by mouth daily.   baclofen 10 MG tablet Commonly known as: LIORESAL Take 10 mg by mouth 3 (three) times daily.   blood glucose meter kit and supplies Kit Dispense based on patient and insurance preference. Use up to four times daily as directed. (FOR ICD-9 250.00, 250.01).   DSS 100 MG Caps Take 100 mg by mouth in the morning and at bedtime.   DULoxetine 60 MG capsule Commonly known as: CYMBALTA Take 60 mg by mouth daily. Takes with 30 mg What changed: Another medication with the same name was removed. Continue taking this medication, and follow the directions you see here.   gabapentin 300 MG capsule Commonly known as: NEURONTIN Take 600 mg by mouth 3 (three) times daily.   insulin glargine 100 UNIT/ML injection Commonly known as: LANTUS Inject 0.25 mLs (25 Units total) into the skin at bedtime.   lamoTRIgine 200 MG tablet Commonly known as: LAMICTAL Take 1 tablet (200 mg total) by mouth 2 (two) times daily.   levETIRAcetam 500 MG tablet Commonly known  as: KEPPRA Take 1 tablet (500 mg total) by mouth 2 (two) times daily.   metFORMIN 500 MG tablet Commonly known as: GLUCOPHAGE Take 500 mg by mouth 2 (two) times daily.   nortriptyline 50 MG capsule Commonly known as: PAMELOR Take 1 capsule (50 mg total) by mouth at bedtime as needed. What changed:  when to take this reasons to take this   tamsulosin 0.4 MG Caps capsule Commonly known as: Flomax Take 1 capsule  (0.4 mg total) by mouth daily.        If you experience worsening of your admission symptoms, develop shortness of breath, life threatening emergency, suicidal or homicidal thoughts you must seek medical attention immediately by calling 911 or calling your MD immediately  if symptoms less severe.  You Must read complete instructions/literature along with all the possible adverse reactions/side effects for all the Medicines you take and that have been prescribed to you. Take any new Medicines after you have completely understood and accept all the possible adverse reactions/side effects.   Please note  You were cared for by a hospitalist during your hospital stay. If you have any questions about your discharge medications or the care you received while you were in the hospital after you are discharged, you can call the unit and asked to speak with the hospitalist on call if the hospitalist that took care of you is not available. Once you are discharged, your primary care physician will handle any further medical issues. Please note that NO REFILLS for any discharge medications will be authorized once you are discharged, as it is imperative that you return to your primary care physician (or establish a relationship with a primary care physician if you do not have one) for your aftercare needs so that they can reassess your need for medications and monitor your lab values. Today   SUBJECTIVE   No seizures reported  VITAL SIGNS:  Blood pressure 135/90, pulse 88, temperature 98.5 F (36.9 C), temperature source Oral, resp. rate 16, height '5\' 6"'  (1.676 m), weight 105.7 kg, SpO2 98 %.  I/O:   Intake/Output Summary (Last 24 hours) at 06/26/2021 1538 Last data filed at 06/26/2021 1414 Gross per 24 hour  Intake 360 ml  Output 1900 ml  Net -1540 ml    PHYSICAL EXAMINATION:  GENERAL:  66 y.o.-year-old patient lying in the bed with no acute distress.  LUNGS: Normal breath sounds bilaterally, no  wheezing, rales,rhonchi or crepitation.  CARDIOVASCULAR: S1, S2 normal. No murmurs, rubs, or gallops.  ABDOMEN: Soft, non-tender, non-distended. Bowel sounds present.  EXTREMITIES: No  clubbing.  NEUROLOGIC: chronic left upper extremity weakness due to previous PSYCHIATRIC:  patient is alert and awake  SKIN: No obvious rash, lesion, or ulcer.   DATA REVIEW:   CBC  Recent Labs  Lab 06/25/21 0741  WBC 4.9  HGB 13.6  HCT 44.0  PLT 176    Chemistries  Recent Labs  Lab 06/25/21 0741 06/25/21 2252  NA 141  --   K 3.8  --   CL 108  --   CO2 26  --   GLUCOSE 103*  --   BUN 15  --   CREATININE 1.46*  --   CALCIUM 8.8*  --   MG  --  2.3    Microbiology Results   Recent Results (from the past 240 hour(s))  Resp Panel by RT-PCR (Flu A&B, Covid) Nasopharyngeal Swab     Status: None   Collection Time: 06/24/21 12:24 AM  Specimen: Nasopharyngeal Swab; Nasopharyngeal(NP) swabs in vial transport medium  Result Value Ref Range Status   SARS Coronavirus 2 by RT PCR NEGATIVE NEGATIVE Final    Comment: (NOTE) SARS-CoV-2 target nucleic acids are NOT DETECTED.  The SARS-CoV-2 RNA is generally detectable in upper respiratory specimens during the acute phase of infection. The lowest concentration of SARS-CoV-2 viral copies this assay can detect is 138 copies/mL. A negative result does not preclude SARS-Cov-2 infection and should not be used as the sole basis for treatment or other patient management decisions. A negative result may occur with  improper specimen collection/handling, submission of specimen other than nasopharyngeal swab, presence of viral mutation(s) within the areas targeted by this assay, and inadequate number of viral copies(<138 copies/mL). A negative result must be combined with clinical observations, patient history, and epidemiological information. The expected result is Negative.  Fact Sheet for Patients:  EntrepreneurPulse.com.au  Fact  Sheet for Healthcare Providers:  IncredibleEmployment.be  This test is no t yet approved or cleared by the [place] FDA and  has been authorized for detection and/or diagnosis of SARS-CoV-2 by FDA under an Emergency Use Authorization (EUA). This EUA will remain  in effect (meaning this test can be used) for the duration of the COVID-19 declaration under Section 564(b)(1) of the Act, 21 U.S.C.section 360bbb-3(b)(1), unless the authorization is terminated  or revoked sooner.       Influenza A by PCR NEGATIVE NEGATIVE Final   Influenza B by PCR NEGATIVE NEGATIVE Final    Comment: (NOTE) The Xpert Xpress SARS-CoV-2/FLU/RSV plus assay is intended as an aid in the diagnosis of influenza from Nasopharyngeal swab specimens and should not be used as a sole basis for treatment. Nasal washings and aspirates are unacceptable for Xpert Xpress SARS-CoV-2/FLU/RSV testing.  Fact Sheet for Patients: EntrepreneurPulse.com.au  Fact Sheet for Healthcare Providers: IncredibleEmployment.be  This test is not yet approved or cleared by the [place] FDA and has been authorized for detection and/or diagnosis of SARS-CoV-2 by FDA under an Emergency Use Authorization (EUA). This EUA will remain in effect (meaning this test can be used) for the duration of the COVID-19 declaration under Section 564(b)(1) of the Act, 21 U.S.C. section 360bbb-3(b)(1), unless the authorization is terminated or revoked.  Performed at [facility], [address] [place], [place] [address]     RADIOLOGY:  CT Head Wo Contrast  Result Date: 06/24/2021 CLINICAL DATA:  Altered mental status. Seizure. EXAM: CT HEAD WITHOUT CONTRAST TECHNIQUE: Contiguous axial images were obtained from the base of the skull through the vertex without intravenous contrast. RADIATION DOSE REDUCTION: This exam was performed according to the departmental dose-optimization  program which includes automated exposure control, adjustment of the mA and/or kV according to patient size and/or use of iterative reconstruction technique. COMPARISON:  05/22/2021 FINDINGS: Brain: No evidence of intracranial hemorrhage, acute infarction, hydrocephalus, extra-axial collection, or mass lesion/mass effect. Old large right MCA distribution infarct remains stable in appearance. Mild chronic small vessel disease is also unchanged in appearance. Vascular:  No hyperdense vessel or other acute findings. Skull: No evidence of fracture or other significant bone abnormality. Sinuses/Orbits:  No acute findings. Other: None. IMPRESSION: No acute intracranial abnormality. Stable old large right MCA distribution infarct, and mild chronic small vessel disease. Electronically Signed   By: Marlaine Hind M.D.   On: 06/24/2021 20:03     CODE STATUS:     Code Status Orders  (From admission, onward)  Start     Ordered   06/24/21 2258  Full code  Continuous        06/24/21 2303           Code Status History     Date Active Date Inactive Code Status Order ID Comments User Context   01/01/2020 0245 01/02/2020 1848 Full Code [id number]  Athena Masse, MD ED   07/12/2019 1747 07/14/2019 2122 Full Code [id number]  Ivor Costa, MD Inpatient   06/12/2019 1539 06/17/2019 0057 Full Code [id number]  Sidney Ace, MD ED   11/30/2018 1707 12/02/2018 1423 Full Code [id number]  Fritzi Mandes, MD Inpatient   10/27/2014 1201 11/02/2014 1319 Full Code [id number]  Max Sane, MD Inpatient        TOTAL TIME TAKING CARE OF THIS PATIENT: 35 minutes.    Fritzi Mandes M.D  [facility]    CC: Primary care physician; Daniel Ruths, MD

## 2021-06-26 NOTE — Procedures (Signed)
Routine EEG Report  Daniel [name] is a 66 y.o. male with a history of right MCA stroke and seizures who is undergoing an EEG to evaluate for seizures.  Report: This EEG was acquired with electrodes placed according to the International 10-20 electrode system (including Fp1, Fp2, F3, F4, C3, C4, P3, P4, O1, O2, T3, T4, T5, T6, A1, A2, Fz, Cz, Pz). The following electrodes were missing or displaced: none.  The occipital dominant rhythm was 8.5 Hz. This activity is reactive to stimulation. Drowsiness was manifested by background fragmentation; deeper stages of sleep were identified by K complexes and sleep spindles. There was intermittent focal slowing over the right hemisphere. There were no interictal epileptiform discharges. There were no electrographic seizures identified. There was no abnormal response to photic stimulation. Hyperventilation was not performed.   Impression and clinical correlation: This EEG was obtained while awake and asleep and is abnormal due to mild intermittent focal slowing over the region of patient's known remote right MCA infarct. No epileptiform abnormalities were seen during this recording.      Su Monks, MD [facility] [phone]  If 7pm- 7am, please page neurology on call as listed in [place].

## 2021-06-26 NOTE — Discharge Instructions (Signed)
Outpatient seizure precautions: Per [facility] statutes, patients with seizures are not allowed to drive until  they have been seizure-free for six months. Use caution when using heavy equipment or power tools. Avoid working on ladders or at heights. Take showers instead of baths. Ensure the water temperature is not too high on the home water heater. Do not go swimming alone. When caring for infants or small children, sit down when holding, feeding, or changing them to minimize risk of injury to the child in the event you have a seizure. Also, Maintain good sleep hygiene. Avoid alcohol.

## 2021-06-26 NOTE — TOC Progression Note (Signed)
Transition of Care [facility]) - Progression Note    Patient Details  Name: Daniel [name] MRN: [id number] Date of Birth: 1955-12-24  Transition of Care [facility]) CM/SW [place], RN Phone Number: 06/26/2021, 4:40 PM  Clinical Narrative:   Patient's wife stated they did not have enough money for prescription.  Patient was discharged upon [facility] assisting.  Called wife's number but unable to leave message.    Expected Discharge Plan: Home/Self Care Barriers to Discharge: Continued Medical Work up  Expected Discharge Plan and Services Expected Discharge Plan: Home/Self Care   Discharge Planning Services: CM Consult   Living arrangements for the past 2 months: Single Family Home Expected Discharge Date: 06/26/21                                     Social Determinants of Health (SDOH) Interventions    Readmission Risk Interventions No flowsheet data found.

## 2021-06-26 NOTE — Progress Notes (Signed)
Received MD order to discharge patient to home, reviewed discharge instructions, home meds, follow  up appointments and prescriptions with patient and wife and they verbalized understanding.

## 2021-06-26 NOTE — Progress Notes (Signed)
Eeg done 

## 2021-07-10 DIAGNOSIS — F1020 Alcohol dependence, uncomplicated: Secondary | ICD-10-CM | POA: Diagnosis not present

## 2021-07-10 DIAGNOSIS — Z8669 Personal history of other diseases of the nervous system and sense organs: Secondary | ICD-10-CM | POA: Diagnosis not present

## 2021-07-10 DIAGNOSIS — I129 Hypertensive chronic kidney disease with stage 1 through stage 4 chronic kidney disease, or unspecified chronic kidney disease: Secondary | ICD-10-CM | POA: Diagnosis not present

## 2021-07-10 DIAGNOSIS — E1122 Type 2 diabetes mellitus with diabetic chronic kidney disease: Secondary | ICD-10-CM | POA: Diagnosis not present

## 2021-07-10 DIAGNOSIS — F01518 Vascular dementia, unspecified severity, with other behavioral disturbance: Secondary | ICD-10-CM | POA: Diagnosis not present

## 2021-07-10 DIAGNOSIS — I69354 Hemiplegia and hemiparesis following cerebral infarction affecting left non-dominant side: Secondary | ICD-10-CM | POA: Diagnosis not present

## 2021-07-10 DIAGNOSIS — N183 Chronic kidney disease, stage 3 unspecified: Secondary | ICD-10-CM | POA: Diagnosis not present

## 2021-07-10 DIAGNOSIS — Z794 Long term (current) use of insulin: Secondary | ICD-10-CM | POA: Diagnosis not present

## 2021-07-14 ENCOUNTER — Telehealth: Payer: Self-pay | Admitting: Student

## 2021-07-14 NOTE — Telephone Encounter (Signed)
Scheduled in person [facility] Palliative visit per request for 07-24-21 at 12:30

## 2021-07-19 ENCOUNTER — Other Ambulatory Visit: Payer: Self-pay

## 2021-07-19 ENCOUNTER — Other Ambulatory Visit: Payer: Medicaid Other | Admitting: Nurse Practitioner

## 2021-07-19 ENCOUNTER — Telehealth: Payer: Self-pay | Admitting: Nurse Practitioner

## 2021-07-19 DIAGNOSIS — K5909 Other constipation: Secondary | ICD-10-CM | POA: Diagnosis not present

## 2021-07-19 DIAGNOSIS — R198 Other specified symptoms and signs involving the digestive system and abdomen: Secondary | ICD-10-CM | POA: Diagnosis not present

## 2021-07-19 DIAGNOSIS — Z86010 Personal history of colonic polyps: Secondary | ICD-10-CM | POA: Diagnosis not present

## 2021-07-19 NOTE — Telephone Encounter (Signed)
I called Ms Adduci to confirm appointment. Ms. Furches endorses will need to reschedule they had another MD appointment. Rescheduled as requested

## 2021-07-24 ENCOUNTER — Other Ambulatory Visit: Payer: Self-pay | Admitting: Student

## 2021-07-24 ENCOUNTER — Other Ambulatory Visit: Payer: Medicaid Other | Admitting: Student

## 2021-08-02 ENCOUNTER — Telehealth: Payer: Self-pay | Admitting: Nurse Practitioner

## 2021-08-02 ENCOUNTER — Other Ambulatory Visit: Payer: Medicaid Other | Admitting: Nurse Practitioner

## 2021-08-02 ENCOUNTER — Other Ambulatory Visit: Payer: Self-pay

## 2021-08-02 NOTE — Telephone Encounter (Signed)
I called Daniel [name] without answer or unable to leave a message to confirm [facility] initial consult.  ?

## 2021-08-10 ENCOUNTER — Telehealth: Payer: Self-pay | Admitting: Nurse Practitioner

## 2021-08-10 NOTE — Telephone Encounter (Signed)
Attempted to contact patient to Reschedule the Palliative Consult, no answer - unable to leave a message due to VM set up. ?

## 2021-08-22 ENCOUNTER — Telehealth: Payer: Self-pay

## 2021-08-22 NOTE — Telephone Encounter (Signed)
Scheduled in person palliative visit for April 6 th at 9 am with patient ?

## 2021-08-31 ENCOUNTER — Telehealth: Payer: Self-pay | Admitting: Nurse Practitioner

## 2021-08-31 ENCOUNTER — Encounter: Payer: Medicaid Other | Admitting: Nurse Practitioner

## 2021-08-31 NOTE — Telephone Encounter (Signed)
I called Mr Daniel [name] to confirm in person pc visit, no answer, unable to leave a message, will put on reschedule list.  ?

## 2021-09-05 ENCOUNTER — Telehealth: Payer: Self-pay | Admitting: Nurse Practitioner

## 2021-09-05 NOTE — Telephone Encounter (Signed)
Attempted to contact the patient to Reschedule the Palliative Consult for the 4th time, with no answer and unable to leave a message.  Patient has either cancelled or was a No Show 3 times previously.  I have cancelled the Palliative referral and notified PCP and Palliative Team. ?

## 2021-09-06 NOTE — Progress Notes (Signed)
No show

## 2021-09-27 NOTE — Progress Notes (Signed)
This encounter was created in error - please disregard.

## 2021-10-06 DIAGNOSIS — Z86010 Personal history of colonic polyps: Secondary | ICD-10-CM | POA: Diagnosis not present

## 2021-10-06 DIAGNOSIS — K625 Hemorrhage of anus and rectum: Secondary | ICD-10-CM | POA: Diagnosis not present

## 2021-10-06 DIAGNOSIS — R198 Other specified symptoms and signs involving the digestive system and abdomen: Secondary | ICD-10-CM | POA: Diagnosis not present

## 2021-10-06 DIAGNOSIS — K5909 Other constipation: Secondary | ICD-10-CM | POA: Diagnosis not present

## 2021-10-30 DIAGNOSIS — M7541 Impingement syndrome of right shoulder: Secondary | ICD-10-CM | POA: Diagnosis not present

## 2021-10-30 DIAGNOSIS — M7502 Adhesive capsulitis of left shoulder: Secondary | ICD-10-CM | POA: Diagnosis not present

## 2021-10-30 DIAGNOSIS — M25512 Pain in left shoulder: Secondary | ICD-10-CM | POA: Diagnosis not present

## 2021-10-30 DIAGNOSIS — M25511 Pain in right shoulder: Secondary | ICD-10-CM | POA: Diagnosis not present

## 2021-10-30 DIAGNOSIS — M19011 Primary osteoarthritis, right shoulder: Secondary | ICD-10-CM | POA: Diagnosis not present

## 2021-10-30 DIAGNOSIS — G8929 Other chronic pain: Secondary | ICD-10-CM | POA: Diagnosis not present

## 2021-10-30 DIAGNOSIS — M7551 Bursitis of right shoulder: Secondary | ICD-10-CM | POA: Diagnosis not present

## 2021-10-30 DIAGNOSIS — M778 Other enthesopathies, not elsewhere classified: Secondary | ICD-10-CM | POA: Diagnosis not present

## 2021-11-15 DIAGNOSIS — I69354 Hemiplegia and hemiparesis following cerebral infarction affecting left non-dominant side: Secondary | ICD-10-CM | POA: Diagnosis not present

## 2021-11-15 DIAGNOSIS — I129 Hypertensive chronic kidney disease with stage 1 through stage 4 chronic kidney disease, or unspecified chronic kidney disease: Secondary | ICD-10-CM | POA: Diagnosis not present

## 2021-11-15 DIAGNOSIS — G40909 Epilepsy, unspecified, not intractable, without status epilepticus: Secondary | ICD-10-CM | POA: Diagnosis not present

## 2021-11-15 DIAGNOSIS — E1122 Type 2 diabetes mellitus with diabetic chronic kidney disease: Secondary | ICD-10-CM | POA: Diagnosis not present

## 2021-11-15 DIAGNOSIS — F01518 Vascular dementia, unspecified severity, with other behavioral disturbance: Secondary | ICD-10-CM | POA: Diagnosis not present

## 2021-11-15 DIAGNOSIS — Z794 Long term (current) use of insulin: Secondary | ICD-10-CM | POA: Diagnosis not present

## 2021-11-15 DIAGNOSIS — N183 Chronic kidney disease, stage 3 unspecified: Secondary | ICD-10-CM | POA: Diagnosis not present

## 2021-11-15 DIAGNOSIS — F1020 Alcohol dependence, uncomplicated: Secondary | ICD-10-CM | POA: Diagnosis not present

## 2021-11-15 DIAGNOSIS — E7800 Pure hypercholesterolemia, unspecified: Secondary | ICD-10-CM | POA: Diagnosis not present

## 2021-12-06 ENCOUNTER — Ambulatory Visit: Payer: Medicare HMO | Admitting: Physical Therapy

## 2021-12-11 ENCOUNTER — Ambulatory Visit: Payer: Medicare HMO | Attending: Sports Medicine | Admitting: Physical Therapy

## 2021-12-11 ENCOUNTER — Ambulatory Visit: Payer: Medicare HMO | Admitting: Physical Therapy

## 2021-12-13 ENCOUNTER — Ambulatory Visit: Payer: Medicare HMO | Admitting: Physical Therapy

## 2021-12-18 ENCOUNTER — Encounter: Payer: Medicare HMO | Admitting: Physical Therapy

## 2021-12-20 ENCOUNTER — Encounter: Payer: Medicare HMO | Admitting: Physical Therapy

## 2021-12-25 ENCOUNTER — Encounter: Payer: Medicare HMO | Admitting: Physical Therapy

## 2021-12-27 ENCOUNTER — Encounter: Payer: Medicare HMO | Admitting: Physical Therapy

## 2022-01-01 ENCOUNTER — Encounter: Payer: Medicare HMO | Admitting: Physical Therapy

## 2022-01-03 ENCOUNTER — Encounter: Payer: Medicare HMO | Admitting: Physical Therapy

## 2022-01-08 ENCOUNTER — Encounter: Payer: Medicare HMO | Admitting: Physical Therapy

## 2022-01-10 ENCOUNTER — Encounter: Payer: Medicare HMO | Admitting: Physical Therapy

## 2022-01-15 ENCOUNTER — Other Ambulatory Visit: Payer: Self-pay

## 2022-01-15 ENCOUNTER — Emergency Department
Admission: EM | Admit: 2022-01-15 | Discharge: 2022-01-15 | Disposition: A | Discharge status: Home / Self Care | Payer: Medicare HMO | Attending: Emergency Medicine | Admitting: Emergency Medicine

## 2022-01-15 ENCOUNTER — Emergency Department: Payer: Medicare HMO

## 2022-01-15 DIAGNOSIS — E1110 Type 2 diabetes mellitus with ketoacidosis without coma: Secondary | ICD-10-CM | POA: Insufficient documentation

## 2022-01-15 DIAGNOSIS — I10 Essential (primary) hypertension: Secondary | ICD-10-CM | POA: Insufficient documentation

## 2022-01-15 DIAGNOSIS — Z711 Person with feared health complaint in whom no diagnosis is made: Secondary | ICD-10-CM | POA: Diagnosis not present

## 2022-01-15 DIAGNOSIS — M160 Bilateral primary osteoarthritis of hip: Secondary | ICD-10-CM | POA: Diagnosis not present

## 2022-01-15 DIAGNOSIS — T189XXA Foreign body of alimentary tract, part unspecified, initial encounter: Secondary | ICD-10-CM | POA: Diagnosis not present

## 2022-01-15 DIAGNOSIS — M47816 Spondylosis without myelopathy or radiculopathy, lumbar region: Secondary | ICD-10-CM | POA: Diagnosis not present

## 2022-01-15 DIAGNOSIS — M19011 Primary osteoarthritis, right shoulder: Secondary | ICD-10-CM | POA: Diagnosis not present

## 2022-01-15 DIAGNOSIS — M7551 Bursitis of right shoulder: Secondary | ICD-10-CM | POA: Diagnosis not present

## 2022-01-15 DIAGNOSIS — M47812 Spondylosis without myelopathy or radiculopathy, cervical region: Secondary | ICD-10-CM | POA: Diagnosis not present

## 2022-01-15 DIAGNOSIS — M778 Other enthesopathies, not elsewhere classified: Secondary | ICD-10-CM | POA: Diagnosis not present

## 2022-01-15 DIAGNOSIS — G8929 Other chronic pain: Secondary | ICD-10-CM | POA: Diagnosis not present

## 2022-01-15 DIAGNOSIS — M7541 Impingement syndrome of right shoulder: Secondary | ICD-10-CM | POA: Diagnosis not present

## 2022-01-15 DIAGNOSIS — M25511 Pain in right shoulder: Secondary | ICD-10-CM | POA: Diagnosis not present

## 2022-01-15 NOTE — ED Triage Notes (Signed)
Pt to ED via POV from home. Pt reports he poured some corn flakes on the counter and there were some pennies he didn't see and accidentally swallowed it. Pt able to talk in complete sentences. Pt states he feels like the penny is stuck in the left side of his throat.

## 2022-01-15 NOTE — Discharge Instructions (Signed)
Up with your primary care provider if any continued problems or concerns.  Continue to eat and drink as normal.  Your x-rays from your neck all the way to your lower abdomen does not show any metallic foreign bodies.

## 2022-01-15 NOTE — ED Notes (Signed)
See triage note  Presents s/p swallowing a penny  States it was on the counter

## 2022-01-15 NOTE — ED Provider Notes (Signed)
[facility] Provider Note    Event Date/Time   First MD Initiated Contact with Patient 01/15/22 1151     (approximate)   History   Swallowed Foreign Body   HPI  Daniel [name] is a 66 y.o. male   presents to the ED with complaint of foreign body in his throat.  Patient states that 1 week ago he poured some corn flakes onto the counter and saw pennies lying there.  Patient states he has this sensation that there is a penny stuck in the left side of his throat.  He states that he feels that each time he coughs that the penny is moving up and down inside his throat.  Patient has continued to drink and eat as normal.  He denies any difficulty speaking.  Patient has history of uncontrolled diabetes, depression, BPH, hypertension, CVA, DKA, AKI, and syncope.      Physical Exam   Triage Vital Signs: ED Triage Vitals  Enc Vitals Group     BP 01/15/22 1132 (!) 146/100     Pulse Rate 01/15/22 1132 79     Resp 01/15/22 1132 18     Temp 01/15/22 1132 98 F (36.7 C)     Temp Source 01/15/22 1132 Oral     SpO2 01/15/22 1132 100 %     Weight 01/15/22 1133 200 lb (90.7 kg)     Height 01/15/22 1133 '5\' 8"'$  (1.727 m)     Head Circumference --      Peak Flow --      Pain Score 01/15/22 1132 4     Pain Loc --      Pain Edu? --      Excl. in [place]? --     Most recent vital signs: Vitals:   01/15/22 1132 01/15/22 1312  BP: (!) 146/100 (!) 138/98  Pulse: 79 80  Resp: 18 18  Temp: 98 F (36.7 C)   SpO2: 100% 99%     General: Awake, no distress. Able to talk without difficulty and swallow saliva without difficulty.    CV:  Good peripheral perfusion.  Resp:  Normal effort. Lungs are clear bilaterally.   Abd:  No distention. Soft , non-tender.  BS present.   Other:     ED Results / Procedures / Treatments   Labs (all labs ordered are listed, but only abnormal results are displayed) Labs Reviewed - No data to display    RADIOLOGY X ray images reviewed  and interpreted by myself were negative for foreign body on soft tissue neck, chest and abdomen.      PROCEDURES:  Critical Care performed:   Procedures   MEDICATIONS ORDERED IN ED: Medications - No data to display   IMPRESSION / MDM / [place] / ED COURSE  I reviewed the triage vital signs and the nursing notes.   Differential diagnosis includes, but is not limited to, Foreign body obstruction, swallowed foreign body, throat discomfort.   66 year old male is here with complaint of possible foreign body (penny) in his throat.  This happened 1 week ago and patient continues to worry.  Xrays were reassuring and patient was made aware that he doesn't have a foreign body on his xray.  He has continued to eat and drink as normal and will continue to do so.       Patient's presentation is most consistent with acute complicated illness / injury requiring diagnostic workup.  FINAL CLINICAL IMPRESSION(S) /  ED DIAGNOSES   Final diagnoses:  Person with feared complaint in whom no diagnosis is made     Rx / DC Orders   ED Discharge Orders     None        Note:  This document was prepared using Dragon voice recognition software and may include unintentional dictation errors.   Elizjah, [place], PA-C 01/15/22 1539    Blake Divine, MD 01/16/22 2222

## 2022-02-07 DIAGNOSIS — J392 Other diseases of pharynx: Secondary | ICD-10-CM | POA: Diagnosis not present

## 2022-03-02 DIAGNOSIS — R070 Pain in throat: Secondary | ICD-10-CM | POA: Diagnosis not present

## 2022-03-02 DIAGNOSIS — K219 Gastro-esophageal reflux disease without esophagitis: Secondary | ICD-10-CM | POA: Diagnosis not present

## 2022-06-15 DIAGNOSIS — G40909 Epilepsy, unspecified, not intractable, without status epilepticus: Secondary | ICD-10-CM | POA: Diagnosis not present

## 2022-06-15 DIAGNOSIS — N183 Chronic kidney disease, stage 3 unspecified: Secondary | ICD-10-CM | POA: Diagnosis not present

## 2022-06-15 DIAGNOSIS — Z125 Encounter for screening for malignant neoplasm of prostate: Secondary | ICD-10-CM | POA: Diagnosis not present

## 2022-06-15 DIAGNOSIS — I69354 Hemiplegia and hemiparesis following cerebral infarction affecting left non-dominant side: Secondary | ICD-10-CM | POA: Diagnosis not present

## 2022-06-15 DIAGNOSIS — I129 Hypertensive chronic kidney disease with stage 1 through stage 4 chronic kidney disease, or unspecified chronic kidney disease: Secondary | ICD-10-CM | POA: Diagnosis not present

## 2022-06-15 DIAGNOSIS — E1122 Type 2 diabetes mellitus with diabetic chronic kidney disease: Secondary | ICD-10-CM | POA: Diagnosis not present

## 2022-06-15 DIAGNOSIS — F1020 Alcohol dependence, uncomplicated: Secondary | ICD-10-CM | POA: Diagnosis not present

## 2022-06-15 DIAGNOSIS — Z794 Long term (current) use of insulin: Secondary | ICD-10-CM | POA: Diagnosis not present

## 2022-07-24 ENCOUNTER — Ambulatory Visit: Payer: Medicare HMO | Admitting: Urology

## 2022-07-25 ENCOUNTER — Ambulatory Visit: Payer: Medicare HMO | Admitting: Urology

## 2022-08-06 DIAGNOSIS — Z6833 Body mass index (BMI) 33.0-33.9, adult: Secondary | ICD-10-CM | POA: Diagnosis not present

## 2022-08-06 DIAGNOSIS — F01518 Vascular dementia, unspecified severity, with other behavioral disturbance: Secondary | ICD-10-CM | POA: Diagnosis not present

## 2022-08-06 DIAGNOSIS — Z86010 Personal history of colonic polyps: Secondary | ICD-10-CM | POA: Diagnosis not present

## 2022-08-06 DIAGNOSIS — K625 Hemorrhage of anus and rectum: Secondary | ICD-10-CM | POA: Diagnosis not present

## 2022-08-06 DIAGNOSIS — K5909 Other constipation: Secondary | ICD-10-CM | POA: Diagnosis not present

## 2022-08-06 DIAGNOSIS — F1020 Alcohol dependence, uncomplicated: Secondary | ICD-10-CM | POA: Diagnosis not present

## 2022-08-06 DIAGNOSIS — N183 Chronic kidney disease, stage 3 unspecified: Secondary | ICD-10-CM | POA: Diagnosis not present

## 2022-08-06 DIAGNOSIS — R198 Other specified symptoms and signs involving the digestive system and abdomen: Secondary | ICD-10-CM | POA: Diagnosis not present

## 2022-08-06 DIAGNOSIS — E1122 Type 2 diabetes mellitus with diabetic chronic kidney disease: Secondary | ICD-10-CM | POA: Diagnosis not present

## 2022-08-06 DIAGNOSIS — I129 Hypertensive chronic kidney disease with stage 1 through stage 4 chronic kidney disease, or unspecified chronic kidney disease: Secondary | ICD-10-CM | POA: Diagnosis not present

## 2022-08-06 DIAGNOSIS — E119 Type 2 diabetes mellitus without complications: Secondary | ICD-10-CM | POA: Diagnosis not present

## 2022-08-06 DIAGNOSIS — Z794 Long term (current) use of insulin: Secondary | ICD-10-CM | POA: Diagnosis not present

## 2022-08-15 ENCOUNTER — Ambulatory Visit: Payer: Medicare HMO | Admitting: Urology

## 2022-08-15 ENCOUNTER — Telehealth: Payer: Self-pay | Admitting: *Deleted

## 2022-08-15 ENCOUNTER — Encounter: Payer: Self-pay | Admitting: *Deleted

## 2022-08-15 NOTE — Patient Outreach (Signed)
  Care Coordination   Initial Visit Note   08/15/2022 Name: Daniel [name] MRN: [id number] DOB: 03/04/1956  Daniel [name] is a 67 y.o. year old male who sees Kirk Ruths, MD for primary care. I spoke with  Daniel [name] and wife Daniel [name] by phone today.  What matters to the patients health and wellness today?  Being confident on correct foods to eat with diabetic diet.     Goals Addressed             This Visit's Progress    Maintaining DM control       Care Coordination Interventions: Provided education to patient about basic DM disease process Reviewed medications with patient and discussed importance of medication adherence Counseled on importance of regular laboratory monitoring as prescribed Discussed plans with patient for ongoing care management follow up and provided patient with direct contact information for care management team Reviewed scheduled/upcoming provider appointments including: PCP on 7/19 Advised patient, providing education and rationale, to check cbg daily before meals and record, calling PCP for findings outside established parameters Screening for signs and symptoms of depression related to chronic disease state  Assessed social determinant of health barriers Wife report patient's blood sugar today was 133, however she has been taking after meals.  Advised importance of checking prior to eating         SDOH assessments and interventions completed:  Yes  SDOH Interventions Today    Flowsheet Row Most Recent Value  SDOH Interventions   Food Insecurity Interventions Intervention Not Indicated  Housing Interventions Intervention Not Indicated  Transportation Interventions Intervention Not Indicated        Care Coordination Interventions:  Yes, provided   Interventions Today    Flowsheet Row Most Recent Value  Chronic Disease   Chronic disease during today's visit Diabetes  General Interventions   General Interventions  Discussed/Reviewed Doctor Visits, Labs, Annual Foot Exam, Annual Eye Exam  Labs Hgb A1c every 3 months  Doctor Visits Discussed/Reviewed Doctor Visits Discussed, PCP  PCP/Specialist Visits Compliance with follow-up visit  Education Interventions   Education Provided Provided Education  Provided Verbal Education On Nutrition, When to see the doctor  Nutrition Interventions   Nutrition Discussed/Reviewed Adding fruits and vegetables, Decreasing sugar intake, Nutrition Reviewed        Follow up plan: Follow up call scheduled for 4/18    Encounter Outcome:  Pt. Visit Completed   Valente David, RN, MSN, [place] Care Management Care Management Coordinator [phone]

## 2022-08-17 NOTE — Patient Instructions (Signed)
Visit Information  Thank you for taking time to visit with me today. Please don't hesitate to contact me if I can be of assistance to you before our next scheduled telephone appointment.  Following are the goals we discussed today:  Read attached information regarding diabetic diet.  Our next appointment is by telephone on 4/18  Please call the care guide team at [phone] if you need to cancel or reschedule your appointment.   Please call the Suicide and Crisis Lifeline: 988 call the [place] National Suicide Prevention Lifeline: 380-533-7700 or TTY: 8328191333 TTY 780-262-0928) to talk to a trained counselor call 1-800-273-TALK (toll free, 24 hour hotline) call 911 if you are experiencing a Mental Health or [place] or need someone to talk to.  Patient verbalizes understanding of instructions and care plan provided today and agrees to view in [place]. Active MyChart status and patient understanding of how to access instructions and care plan via MyChart confirmed with patient.     The patient has been provided with contact information for the care management team and has been advised to call with any health related questions or concerns.   Valente David, RN, MSN, [place] Care Management Care Management Coordinator [phone]

## 2022-09-05 ENCOUNTER — Ambulatory Visit: Payer: Medicare HMO | Admitting: Urology

## 2022-09-13 ENCOUNTER — Ambulatory Visit: Payer: Self-pay | Admitting: *Deleted

## 2022-09-13 NOTE — Patient Outreach (Signed)
  Care Coordination   09/13/2022 Name: Daniel [name] MRN: [id number] DOB: [birth year]   Care Coordination Outreach Attempts:  An unsuccessful telephone outreach was attempted for a scheduled appointment today.  Follow Up Plan:  Additional outreach attempts will be made to offer the patient care coordination information and services.   Encounter Outcome:  No Answer   Care Coordination Interventions:  No, not indicated    Kemper Durie, RN, MSN, [facility] Care Management Care Management Coordinator [phone]

## 2022-09-24 ENCOUNTER — Ambulatory Visit: Payer: Self-pay | Admitting: *Deleted

## 2022-09-24 NOTE — Patient Outreach (Signed)
  Care Coordination   Follow Up Visit Note   09/25/2022 Name: Daniel [name] MRN: [id number] DOB: 06/18/1955  Daniel [name] is a 67 y.o. year old male who sees Lauro Regulus, MD for primary care. I spoke with  [name] by phone today.  What matters to the patients health and wellness today?  Diabetes control    Goals Addressed             This Visit's Progress    Maintaining DM control   On track    Care Coordination Interventions: Provided education to patient about basic DM disease process Reviewed medications with patient and discussed importance of medication adherence Counseled on importance of regular laboratory monitoring as prescribed Discussed plans with patient for ongoing care management follow up and provided patient with direct contact information for care management team Reviewed scheduled/upcoming provider appointments including: PCP on 7/19 Advised patient, providing education and rationale, to check cbg daily before meals and record, calling PCP for findings outside established parameters         SDOH assessments and interventions completed:  No     Care Coordination Interventions:  Yes, provided   Interventions Today    Flowsheet Row Most Recent Value  Chronic Disease   Chronic disease during today's visit Diabetes  General Interventions   General Interventions Discussed/Reviewed General Interventions Reviewed, Labs, Doctor Visits  Labs Hgb A1c every 3 months  [curently controlled at 6.6]  Doctor Visits Discussed/Reviewed Doctor Visits Reviewed, PCP  [PCP visit in July]  PCP/Specialist Visits Compliance with follow-up visit  Education Interventions   Education Provided Provided Education  Provided Verbal Education On Blood Sugar Monitoring, When to see the doctor, Medication, Nutrition  [Wife report compliance of medications, state blood sugar today was 144.]  Nutrition Interventions   Nutrition Discussed/Reviewed Nutrition  Reviewed, Carbohydrate meal planning, Adding fruits and vegetables, Decreasing sugar intake  [Wife report patient isn't always compliant with diabetic diet, she works to prepare al meals for hime to be adherent, but he eat out sometimes]        Follow up plan: Follow up call scheduled for 7/9    Encounter Outcome:  Pt. Visit Completed   Kemper Durie, RN, MSN, [facility] Care Management Care Management Coordinator [phone]

## 2022-10-03 ENCOUNTER — Ambulatory Visit: Payer: Medicare HMO | Admitting: Urology

## 2022-10-03 VITALS — BP 113/73 | HR 83 | Ht 68.0 in | Wt 200.0 lb

## 2022-10-03 DIAGNOSIS — R3912 Poor urinary stream: Secondary | ICD-10-CM | POA: Diagnosis not present

## 2022-10-03 DIAGNOSIS — R972 Elevated prostate specific antigen [PSA]: Secondary | ICD-10-CM

## 2022-10-03 DIAGNOSIS — N400 Enlarged prostate without lower urinary tract symptoms: Secondary | ICD-10-CM

## 2022-10-03 DIAGNOSIS — N401 Enlarged prostate with lower urinary tract symptoms: Secondary | ICD-10-CM

## 2022-10-03 DIAGNOSIS — N529 Male erectile dysfunction, unspecified: Secondary | ICD-10-CM | POA: Diagnosis not present

## 2022-10-03 MED ORDER — SILDENAFIL CITRATE 20 MG PO TABS: ORAL_TABLET | ORAL | 11 refills | Status: AC

## 2022-10-03 MED ORDER — FINASTERIDE 5 MG PO TABS: 5.0000 mg | ORAL_TABLET | Freq: Every day | ORAL | 3 refills | Status: AC

## 2022-10-03 NOTE — Progress Notes (Signed)
Marcelle Overlie Plume,acting as a scribe for Daniel Scotland, MD.,have documented all relevant documentation on the behalf of Daniel Scotland, MD,as directed by  Daniel Scotland, MD while in the presence of Daniel Scotland, MD.  10/03/2022 11:32 AM   [name] 1955/12/08 [id number]  Referring provider: Lauro Regulus, MD [address] [facility] [place] [address] [place],  [place] [address]  Chief Complaint  Patient presents with   New Patient (Initial Visit)    HPI: 67 year-old male who is referred for further evaluation of elevated PSA. He was known to have a PSA of 4.49 on 06/15/2022. PSA prior to that was in 09/2020, at which time his PSA was 2.67. We have no additional values for comparison.  He had been previously seen by me in 06/2017 for the same issue. At that point in time, his PSA had actually risen to 4.93. His rectal exam at the time was limited by his habitus and comorbidity. He also had some symptoms at the time consistent with underlying BPH, and he was started on Flomax. He failed to follow up.   Today, he reports weak urinary stream, intermittent flow, and symptoms of erectile dysfunction. He hs not previously tried medications like Viagra for this issue.   He has multiple medical comorbidities including poorly controlled diabetes, hypertension, and stroke.    PMH: Past Medical History:  Diagnosis Date   Diabetes mellitus without complication (HCC)    Hypertension    Pancreatitis    Seizures (HCC)    Stroke [facility])    Stroke [facility]) 2016    Surgical History: Past Surgical History:  Procedure Laterality Date   COLONOSCOPY WITH PROPOFOL N/A 10/07/2019   Procedure: COLONOSCOPY WITH PROPOFOL;  Surgeon: Toledo, Boykin Nearing, MD;  Location: [facility] ENDOSCOPY;  Service: Gastroenterology;  Laterality: N/A;   ESOPHAGOGASTRODUODENOSCOPY     ESOPHAGOGASTRODUODENOSCOPY (EGD) WITH PROPOFOL N/A 10/07/2019   Procedure: ESOPHAGOGASTRODUODENOSCOPY (EGD) WITH PROPOFOL;  Surgeon:  Toledo, Boykin Nearing, MD;  Location: [facility] ENDOSCOPY;  Service: Gastroenterology;  Laterality: N/A;   TEE WITHOUT CARDIOVERSION N/A 11/01/2014   Procedure: TRANSESOPHAGEAL ECHOCARDIOGRAM (TEE);  Surgeon: Iran Ouch, MD;  Location: [facility] ORS;  Service: Cardiovascular;  Laterality: N/A;    Home Medications:  Allergies as of 10/03/2022   No Known Allergies      Medication List        Accurate as of Oct 03, 2022 11:32 AM. If you have any questions, ask your nurse or doctor.          amLODipine 10 MG tablet Commonly known as: NORVASC Take 1 tablet (10 mg total) by mouth daily.   atorvastatin 40 MG tablet Commonly known as: LIPITOR Take 40 mg by mouth daily.   baclofen 10 MG tablet Commonly known as: LIORESAL Take 10 mg by mouth 3 (three) times daily.   blood glucose meter kit and supplies Kit Dispense based on patient and insurance preference. Use up to four times daily as directed. (FOR ICD-9 250.00, 250.01).   DSS 100 MG Caps Take 100 mg by mouth in the morning and at bedtime.   DULoxetine 60 MG capsule Commonly known as: CYMBALTA Take 60 mg by mouth daily. Takes with 30 mg   finasteride 5 MG tablet Commonly known as: PROSCAR Take 1 tablet (5 mg total) by mouth daily.   gabapentin 300 MG capsule Commonly known as: NEURONTIN Take 600 mg by mouth 3 (three) times daily.   insulin glargine 100 UNIT/ML injection Commonly known as:  LANTUS Inject 0.25 mLs (25 Units total) into the skin at bedtime.   lamoTRIgine 200 MG tablet Commonly known as: LAMICTAL Take 1 tablet (200 mg total) by mouth 2 (two) times daily.   levETIRAcetam 500 MG tablet Commonly known as: KEPPRA Take 1 tablet (500 mg total) by mouth 2 (two) times daily.   metFORMIN 500 MG tablet Commonly known as: GLUCOPHAGE Take 500 mg by mouth 2 (two) times daily.   nortriptyline 50 MG capsule Commonly known as: PAMELOR Take 1 capsule (50 mg total) by mouth at bedtime as needed.   omeprazole 40 MG  capsule Commonly known as: PRILOSEC Take by mouth.   sildenafil 20 MG tablet Commonly known as: REVATIO Take 1-5 tablets as needed, one hour prior to intercourse   tamsulosin 0.4 MG Caps capsule Commonly known as: Flomax Take 1 capsule (0.4 mg total) by mouth daily.        Family History: Family History  Problem Relation Age of Onset   CAD Sister    Diabetes Sister    Hypertension Sister    Diabetes Sister    Prostate cancer Father    Diabetes Mother    Bladder Cancer Neg Hx    Kidney cancer Neg Hx     Social History:  reports that he quit smoking about 7 years ago. His smoking use included cigarettes. He has a 22.50 pack-year smoking history. He has never used smokeless tobacco. He reports that he does not drink alcohol and does not use drugs.   Physical Exam: BP 113/73   Pulse 83   Ht 5\' 8"  (1.727 m)   Wt 200 lb (90.7 kg)   BMI 30.41 kg/m   Constitutional:  Alert and oriented, No acute distress. HEENT: [place] AT, moist mucus membranes.  Trachea midline, no masses. Neurologic: Grossly intact, no focal deficits, moving all 4 extremities. Psychiatric: Normal mood and affect.   Assessment & Plan:    1. Elevated PSA - We reviewed the implications of an elevated PSA and the uncertainty surrounding it. In general, a man's PSA increases with age and is produced by both normal and cancerous prostate tissue. Differential for elevated PSA is BPH, prostate cancer, infection, recent intercourse/ejaculation, prostate infarction, recent urethroscopic manipulation (foley placement/cystoscopy) and prostatitis. Management of an elevated PSA can include observation or prostate biopsy and wediscussed this in detail. We discussed that indications for prostate biopsy are defined by age and race specific PSA cutoffs as well as a PSA velocity of 0.75/year. - Deferred DRE because previously he was limited by habitus and mobility was not helpful - He has had an elevated PSA in the past and it  has returned back to his baseline - He also has multiple medical comorbidities - Plan to repeat PSA in 3 months   2. BPH with weak stream - Currently prescribed Flomax by his PCP.  - Not currently considered an ideal surgical candidate - Plan to optimize mediation management by adding finasteride to his regimen.   3. Erectile dysfunction - Not currently prescribed any medications that are contraindicated  - Prescribe sildenafil, starting at 20 mg with option to increase up to 100 mg as needed.   Return in about 3 months (around 01/03/2023) for repeat PSA.   [facility] [address], [address] [place], [place] [address] [phone]

## 2022-12-04 ENCOUNTER — Ambulatory Visit: Payer: Self-pay | Admitting: *Deleted

## 2022-12-04 NOTE — Patient Outreach (Signed)
  Care Coordination   12/04/2022 Name: Daniel [name] MRN: [id number] DOB: April 25, 1956   Care Coordination Outreach Attempts:  An unsuccessful telephone outreach was attempted for a scheduled appointment today.  Follow Up Plan:  Additional outreach attempts will be made to offer the patient care coordination information and services.   Encounter Outcome:  No Answer   Care Coordination Interventions:  No, not indicated    Kemper Durie, RN, MSN, [facility] Care Management Care Management Coordinator [phone]

## 2022-12-14 DIAGNOSIS — Z0000 Encounter for general adult medical examination without abnormal findings: Secondary | ICD-10-CM | POA: Diagnosis not present

## 2022-12-14 DIAGNOSIS — Z1331 Encounter for screening for depression: Secondary | ICD-10-CM | POA: Diagnosis not present

## 2022-12-14 DIAGNOSIS — E1122 Type 2 diabetes mellitus with diabetic chronic kidney disease: Secondary | ICD-10-CM | POA: Diagnosis not present

## 2022-12-14 DIAGNOSIS — Z794 Long term (current) use of insulin: Secondary | ICD-10-CM | POA: Diagnosis not present

## 2022-12-14 DIAGNOSIS — R569 Unspecified convulsions: Secondary | ICD-10-CM | POA: Diagnosis not present

## 2022-12-14 DIAGNOSIS — N183 Chronic kidney disease, stage 3 unspecified: Secondary | ICD-10-CM | POA: Diagnosis not present

## 2022-12-14 DIAGNOSIS — I69354 Hemiplegia and hemiparesis following cerebral infarction affecting left non-dominant side: Secondary | ICD-10-CM | POA: Diagnosis not present

## 2022-12-14 DIAGNOSIS — I129 Hypertensive chronic kidney disease with stage 1 through stage 4 chronic kidney disease, or unspecified chronic kidney disease: Secondary | ICD-10-CM | POA: Diagnosis not present

## 2022-12-18 ENCOUNTER — Telehealth: Payer: Self-pay | Admitting: *Deleted

## 2022-12-18 NOTE — Progress Notes (Signed)
  Care Coordination Note  12/18/2022 Name: Daniel [name] MRN: [id number] DOB: Oct 15, 1955  [name] is a 67 y.o. year old male who is a primary care patient of Lauro Regulus, MD and is actively engaged with the care management team. I reached out to [name] by phone today to assist with re-scheduling a follow up visit with the RN Case Manager  Follow up plan: Unsuccessful telephone outreach attempt made. A HIPAA compliant phone message was left for the patient providing contact information and requesting a return call.   [name], CCMA Care Coordination Care Guide Direct Dial: [phone]

## 2022-12-31 ENCOUNTER — Other Ambulatory Visit: Payer: Medicare HMO

## 2022-12-31 ENCOUNTER — Encounter: Payer: Self-pay | Admitting: Urology

## 2023-01-02 NOTE — Progress Notes (Deleted)
01/03/2023 9:40 AM   Daniel [name] [birth year] [id number]  Referring provider: Lauro Regulus, MD [address] [facility] [address] [place],  [place] [address]  Urological history: 1. Elevated PSA  -PSA (09/2022) - pending -PSA trend  4.93 (2019)  3.60 (2019)  2.35 (03/2019)  2.67 (09/2020)  4.49 (2024)  2. BPH with LU TS -tamsulosin 0.4 mg daily and finasteride 5 mg daily   3. ED -contributing factors of age, BPH, diabetes, HTN, CVA, history of smoking, depression, dementia and HLD. -sildenafil 20 mg, on-demand-dosing  No chief complaint on file.  HPI: Daniel [name] is a 67 y.o. male who presents today for three months follow up.  Previous records reviewed.   I PSS ***  PVR ***    Score:  1-7 Mild 8-19 Moderate 20-35 Severe   SHIM ***    Score: 1-7 Severe ED 8-11 Moderate ED 12-16 Mild-Moderate ED 17-21 Mild ED 22-25 No ED   PMH: Past Medical History:  Diagnosis Date   Diabetes mellitus without complication (HCC)    Hypertension    Pancreatitis    Seizures (HCC)    Stroke [facility])    Stroke (HCC) 2016    Surgical History: Past Surgical History:  Procedure Laterality Date   COLONOSCOPY WITH PROPOFOL N/A 10/07/2019   Procedure: COLONOSCOPY WITH PROPOFOL;  Surgeon: Toledo, Boykin Nearing, MD;  Location: [facility] ENDOSCOPY;  Service: Gastroenterology;  Laterality: N/A;   ESOPHAGOGASTRODUODENOSCOPY     ESOPHAGOGASTRODUODENOSCOPY (EGD) WITH PROPOFOL N/A 10/07/2019   Procedure: ESOPHAGOGASTRODUODENOSCOPY (EGD) WITH PROPOFOL;  Surgeon: Toledo, Boykin Nearing, MD;  Location: [facility] ENDOSCOPY;  Service: Gastroenterology;  Laterality: N/A;   TEE WITHOUT CARDIOVERSION N/A 11/01/2014   Procedure: TRANSESOPHAGEAL ECHOCARDIOGRAM (TEE);  Surgeon: Iran Ouch, MD;  Location: [facility] ORS;  Service: Cardiovascular;  Laterality: N/A;    Home Medications:  Allergies as of 01/03/2023   No Known Allergies      Medication List        Accurate as of  January 02, 2023  9:40 AM. If you have any questions, ask your nurse or doctor.          amLODipine 10 MG tablet Commonly known as: NORVASC Take 1 tablet (10 mg total) by mouth daily.   atorvastatin 40 MG tablet Commonly known as: LIPITOR Take 40 mg by mouth daily.   baclofen 10 MG tablet Commonly known as: LIORESAL Take 10 mg by mouth 3 (three) times daily.   blood glucose meter kit and supplies Kit Dispense based on patient and insurance preference. Use up to four times daily as directed. (FOR ICD-9 250.00, 250.01).   DSS 100 MG Caps Take 100 mg by mouth in the morning and at bedtime.   DULoxetine 60 MG capsule Commonly known as: CYMBALTA Take 60 mg by mouth daily. Takes with 30 mg   finasteride 5 MG tablet Commonly known as: PROSCAR Take 1 tablet (5 mg total) by mouth daily.   gabapentin 300 MG capsule Commonly known as: NEURONTIN Take 600 mg by mouth 3 (three) times daily.   insulin glargine 100 UNIT/ML injection Commonly known as: LANTUS Inject 0.25 mLs (25 Units total) into the skin at bedtime.   lamoTRIgine 200 MG tablet Commonly known as: LAMICTAL Take 1 tablet (200 mg total) by mouth 2 (two) times daily.   levETIRAcetam 500 MG tablet Commonly known as: KEPPRA Take 1 tablet (500 mg total) by mouth 2 (two) times daily.   metFORMIN 500 MG tablet Commonly  known as: GLUCOPHAGE Take 500 mg by mouth 2 (two) times daily.   nortriptyline 50 MG capsule Commonly known as: PAMELOR Take 1 capsule (50 mg total) by mouth at bedtime as needed.   omeprazole 40 MG capsule Commonly known as: PRILOSEC Take by mouth.   sildenafil 20 MG tablet Commonly known as: REVATIO Take 1-5 tablets as needed, one hour prior to intercourse   tamsulosin 0.4 MG Caps capsule Commonly known as: Flomax Take 1 capsule (0.4 mg total) by mouth daily.        Allergies: No Known Allergies  Family History: Family History  Problem Relation Age of Onset   CAD Sister    Diabetes  Sister    Hypertension Sister    Diabetes Sister    Prostate cancer Father    Diabetes Mother    Bladder Cancer Neg Hx    Kidney cancer Neg Hx     Social History:  reports that he quit smoking about 8 years ago. His smoking use included cigarettes. He started smoking about 53 years ago. He has a 22.5 pack-year smoking history. He has never used smokeless tobacco. He reports that he does not drink alcohol and does not use drugs.  ROS: Pertinent ROS in HPI  Physical Exam: There were no vitals taken for this visit.  Constitutional:  Well nourished. Alert and oriented, No acute distress. HEENT: [place] AT, moist mucus membranes.  Trachea midline, no masses. Cardiovascular: No clubbing, cyanosis, or edema. Respiratory: Normal respiratory effort, no increased work of breathing. GI: Abdomen is soft, non tender, non distended, no abdominal masses. Liver and spleen not palpable.  No hernias appreciated.  Stool sample for occult testing is not indicated.   GU: No CVA tenderness.  No bladder fullness or masses.  Patient with circumcised/uncircumcised phallus. ***Foreskin easily retracted***  Urethral meatus is patent.  No penile discharge. No penile lesions or rashes. Scrotum without lesions, cysts, rashes and/or edema.  Testicles are located scrotally bilaterally. No masses are appreciated in the testicles. Left and right epididymis are normal. Rectal: Patient with  normal sphincter tone. Anus and perineum without scarring or rashes. No rectal masses are appreciated. Prostate is approximately *** grams, *** nodules are appreciated. Seminal vesicles are normal. Skin: No rashes, bruises or suspicious lesions. Lymph: No cervical or inguinal adenopathy. Neurologic: Grossly intact, no focal deficits, moving all 4 extremities. Psychiatric: Normal mood and affect.  Laboratory Data: ontains abnormal data Comprehensive Metabolic Panel (CMP) Order: [id number] Component Ref Range & Units 2 wk ago  Glucose 70  - 110 mg/dL 629 High   Sodium 528 - 145 mmol/L 142  Potassium 3.6 - 5.1 mmol/L 3.8  Chloride 97 - 109 mmol/L 105  Carbon Dioxide (CO2) 22.0 - 32.0 mmol/L 29.8  Urea Nitrogen (BUN) 7 - 25 mg/dL 18  Creatinine 0.7 - 1.3 mg/dL 1.4 High   Glomerular Filtration Rate (eGFR) >60 mL/min/1.73sq m 55 Low   Comment: CKD-EPI (2021) does not include patient's race in the calculation of eGFR.  Monitoring changes of plasma creatinine and eGFR over time is useful for monitoring kidney function.  Interpretive Ranges for eGFR (CKD-EPI 2021):  eGFR:       >60 mL/min/1.73 sq. m - Normal eGFR:       30-59 mL/min/1.73 sq. m - Moderately Decreased eGFR:       15-29 mL/min/1.73 sq. m  - Severely Decreased eGFR:       < 15 mL/min/1.73 sq. m  - Kidney Failure   Note: These  eGFR calculations do not apply in acute situations when eGFR is changing rapidly or patients on dialysis.  Calcium 8.7 - 10.3 mg/dL 9.3  AST 8 - 39 U/L 21  ALT 6 - 57 U/L 30  Alk Phos (alkaline Phosphatase) 34 - 104 U/L 104  Albumin 3.5 - 4.8 g/dL 4.4  Bilirubin, Total 0.3 - 1.2 mg/dL 0.7  Protein, Total 6.1 - 7.9 g/dL 7.2  A/G Ratio 1.0 - 5.0 gm/dL 1.6  Resulting Agency [facility]   Specimen Collected: 12/14/22 10:36   Performed by: [facility] Last Resulted: 12/14/22 15:42  Received From: [facility]  Result Received: 12/31/22 09:14   Hemoglobin A1C Order: [id number] Component Ref Range & Units 2 wk ago  Hemoglobin A1C 4.2 - 5.6 % 7.0 High   Average Blood Glucose (Calc) mg/dL 409  Resulting Agency [facility]  Narrative Performed by [facility] Normal Range:    4.2 - 5.6% Increased Risk:  5.7 - 6.4% Diabetes:        >= 6.5% Glycemic Control for adults with diabetes:  <7%    Specimen Collected: 12/14/22 10:36   Performed by: [facility] Last Resulted: 12/14/22 13:16  Received From: [facility]   Result Received: 12/31/22 09:14   Lipid Panel w/calc LDL Order: [id number] Component Ref Range & Units 2 wk ago  Cholesterol, Total 100 - 200 mg/dL 956  Triglyceride 35 - 199 mg/dL 213 High   HDL (High Density Lipoprotein) Cholesterol 29.0 - 71.0 mg/dL 08.6  LDL Calculated 0 - 130 mg/dL 51  VLDL Cholesterol mg/dL 60  Cholesterol/HDL Ratio 4.1  Resulting Agency [facility]   Specimen Collected: 12/14/22 10:36   Performed by: [facility] Last Resulted: 12/14/22 14:31  Received From: [facility]  Result Received: 12/31/22 09:14  I have reviewed the labs.   Pertinent Imaging: N/A  Assessment & Plan:  ***  1. Elevated PSA -PSA pending  2. BPH with LUTS -PSA stable *** -DRE benign *** -UA benign *** -PVR < 300 cc *** -symptoms - *** -most bothersome symptoms are *** -continue conservative management, avoiding bladder irritants and timed voiding's -Initiate alpha-blocker (***), discussed side effects *** -Initiate 5 alpha reductase inhibitor (***), discussed side effects *** -Continue tamsulosin 0.4 mg daily, alfuzosin 10 mg daily, Rapaflo 8 mg daily, terazosin, doxazosin, Cialis 5 mg daily and finasteride 5 mg daily, dutasteride 0.5 mg daily***:refills given -Cannot tolerate medication or medication failure, schedule cystoscopy ***  3. Erectile dysfunction - I explained to the patient that in order to achieve an erection it takes good functioning of the nervous system (parasympathetic and rs, sympathetic, sensory and motor), good blood flow into the erectile tissue of the penis and a desire to have sex - I explained that conditions like diabetes, hypertension, coronary artery disease, peripheral vascular disease, smoking, alcohol consumption, age, sleep apnea and BPH can diminish the ability to have an erection - I explained the ED may be a risk marker for underlying CVD and he should follow up with PCP for further studies  *** - we will obtain a serum testosterone level at this time; if it is abnormal we will need to repeat the study for confirmation *** - A recent study published in Sex Med 2018 Apr 13 revealed moderate to vigorous aerobic exercise for 40 minutes 4 times per week can decrease erectile  problems caused by physical inactivity, obesity, hypertension, metabolic syndrome and/or cardiovascular diseases *** - We discussed trying a *** different PDE5 inhibitor, intra-urethral suppositories, intracavernous vasoactive drug injection therapy, vacuum erection devices, LI-ESWT and penile prosthesis implantation    No follow-ups on file.  These notes generated with voice recognition software. I apologize for typographical errors.  Cloretta Ned  [facility] [address]  [address] [place], [place] [address] [phone]

## 2023-01-03 ENCOUNTER — Ambulatory Visit: Payer: Medicare HMO | Admitting: Urology

## 2023-01-03 ENCOUNTER — Encounter: Payer: Self-pay | Admitting: Urology

## 2023-01-03 DIAGNOSIS — R972 Elevated prostate specific antigen [PSA]: Secondary | ICD-10-CM

## 2023-01-03 DIAGNOSIS — N400 Enlarged prostate without lower urinary tract symptoms: Secondary | ICD-10-CM

## 2023-01-03 DIAGNOSIS — N529 Male erectile dysfunction, unspecified: Secondary | ICD-10-CM

## 2023-01-11 NOTE — Progress Notes (Signed)
  Care Coordination Note  01/11/2023 Name: [name] MRN: [id number] DOB: [birth year]  Daniel [name] is a 67 y.o. year old male who is a primary care patient of Lauro Regulus, MD and is actively engaged with the care management team. I reached out to [name] by phone today to assist with re-scheduling a follow up visit with the RN Case Manager  Follow up plan: Patient declines further follow up and engagement by the care management team. Appropriate care team members and provider have been notified via electronic communication.   [name], CCMA Care Coordination Care Guide Direct Dial: [phone]

## 2023-01-17 ENCOUNTER — Telehealth: Payer: Self-pay | Admitting: *Deleted

## 2023-01-17 NOTE — Patient Outreach (Signed)
  Care Coordination   Follow Up Visit Note   01/17/2023 Name: Daniel [name] MRN: [id number] DOB: [birth year]  Daniel [name] is a 67 y.o. year old male who sees Lauro Regulus, MD for primary care. I spoke with wife of Daniel [name] by phone today.  What matters to the patients health and wellness today?  Per wife, patient is doing well, does not want follow up call, will contact this RNCM if needs change    Goals Addressed             This Visit's Progress    COMPLETED: Maintaining DM control   On track    Care Coordination Interventions: Provided education to patient about basic DM disease process Reviewed medications with patient and discussed importance of medication adherence Counseled on importance of regular laboratory monitoring as prescribed Discussed plans with patient for ongoing care management follow up and provided patient with direct contact information for care management team Reviewed scheduled/upcoming provider appointments including: PCP on 7/19 Advised patient, providing education and rationale, to check cbg daily before meals and record, calling PCP for findings outside established parameters         SDOH assessments and interventions completed:  No     Care Coordination Interventions:  Yes, provided   Interventions Today    Flowsheet Row Most Recent Value  Chronic Disease   Chronic disease during today's visit Diabetes, Hypertension (HTN), Chronic Kidney Disease/End Stage Renal Disease (ESRD)  General Interventions   General Interventions Discussed/Reviewed Labs, General Interventions Reviewed, Doctor Visits  Labs Hgb A1c every 3 months  [most recent A1C, 6.6]  Doctor Visits Discussed/Reviewed Doctor Visits Reviewed, Specialist, PCP  [urology 8/30, GI 9/12, PCP in November]  PCP/Specialist Visits Compliance with follow-up visit  Education Interventions   Education Provided Provided Education  Provided Verbal Education On Blood Sugar  Monitoring, Medication, When to see the doctor       Follow up plan: No further intervention required.   Encounter Outcome:  Pt. Visit Completed   Kemper Durie, RN, MSN, [facility] Care Management Care Management Coordinator [phone]

## 2023-01-23 ENCOUNTER — Other Ambulatory Visit: Payer: Self-pay

## 2023-01-23 ENCOUNTER — Other Ambulatory Visit: Payer: Medicare HMO

## 2023-01-23 DIAGNOSIS — N400 Enlarged prostate without lower urinary tract symptoms: Secondary | ICD-10-CM

## 2023-01-24 LAB — PSA: Prostate Specific Ag, Serum: 1.7 ng/mL (ref 0.0–4.0)

## 2023-01-25 ENCOUNTER — Ambulatory Visit: Payer: Medicare HMO | Admitting: Urology

## 2023-01-29 NOTE — Progress Notes (Deleted)
01/30/2023 11:50 AM   [name] 08-17-1955 [id number]  Referring provider: Lauro Regulus, MD [address] [facility] [address] [place],  [place] [address]  Urological history: 1. Elevated PSA  -PSA (09/2022) - 1.7 -PSA trend  4.93 (2019)  3.60 (2019)  2.35 (03/2019)  2.67 (09/2020)  4.49 (2024)  2. BPH with LU TS -tamsulosin 0.4 mg daily and finasteride 5 mg daily   3. ED -contributing factors of age, BPH, diabetes, HTN, CVA, history of smoking, depression, dementia and HLD. -sildenafil 20 mg, on-demand-dosing  No chief complaint on file.  HPI: Daniel [name] is a 67 y.o. male who presents today for three months follow up.  Previous records reviewed.   I PSS ***  PVR ***    Score:  1-7 Mild 8-19 Moderate 20-35 Severe   SHIM ***    Score: 1-7 Severe ED 8-11 Moderate ED 12-16 Mild-Moderate ED 17-21 Mild ED 22-25 No ED   PMH: Past Medical History:  Diagnosis Date   Diabetes mellitus without complication (HCC)    Hypertension    Pancreatitis    Seizures (HCC)    Stroke [facility])    Stroke (HCC) 2016    Surgical History: Past Surgical History:  Procedure Laterality Date   COLONOSCOPY WITH PROPOFOL N/A 10/07/2019   Procedure: COLONOSCOPY WITH PROPOFOL;  Surgeon: Toledo, Boykin Nearing, MD;  Location: [facility] ENDOSCOPY;  Service: Gastroenterology;  Laterality: N/A;   ESOPHAGOGASTRODUODENOSCOPY     ESOPHAGOGASTRODUODENOSCOPY (EGD) WITH PROPOFOL N/A 10/07/2019   Procedure: ESOPHAGOGASTRODUODENOSCOPY (EGD) WITH PROPOFOL;  Surgeon: Toledo, Boykin Nearing, MD;  Location: [facility] ENDOS[facility];  Service: Gastroenterology;  Laterality: N/A;   TEE WITHOUT CARDIOVERSION N/A 11/01/2014   Procedure: TRANSESOPHAGEAL ECHOCARDIOGRAM (TEE);  Surgeon: Iran Ouch, MD;  Location: [facility] ORS;  Service: Cardiovascular;  Laterality: N/A;    Home Medications:  Allergies as of 01/30/2023   No Known Allergies      Medication List        Accurate as of  January 29, 2023 11:50 AM. If you have any questions, ask your nurse or doctor.          amLODipine 10 MG tablet Commonly known as: NORVASC Take 1 tablet (10 mg total) by mouth daily.   atorvastatin 40 MG tablet Commonly known as: LIPITOR Take 40 mg by mouth daily.   baclofen 10 MG tablet Commonly known as: LIORESAL Take 10 mg by mouth 3 (three) times daily.   blood glucose meter kit and supplies Kit Dispense based on patient and insurance preference. Use up to four times daily as directed. (FOR ICD-9 250.00, 250.01).   DSS 100 MG Caps Take 100 mg by mouth in the morning and at bedtime.   DULoxetine 60 MG capsule Commonly known as: CYMBALTA Take 60 mg by mouth daily. Takes with 30 mg   finasteride 5 MG tablet Commonly known as: PROSCAR Take 1 tablet (5 mg total) by mouth daily.   gabapentin 300 MG capsule Commonly known as: NEURONTIN Take 600 mg by mouth 3 (three) times daily.   insulin glargine 100 UNIT/ML injection Commonly known as: LANTUS Inject 0.25 mLs (25 Units total) into the skin at bedtime.   lamoTRIgine 200 MG tablet Commonly known as: LAMICTAL Take 1 tablet (200 mg total) by mouth 2 (two) times daily.   levETIRAcetam 500 MG tablet Commonly known as: KEPPRA Take 1 tablet (500 mg total) by mouth 2 (two) times daily.   metFORMIN 500 MG tablet Commonly known  as: GLUCOPHAGE Take 500 mg by mouth 2 (two) times daily.   nortriptyline 50 MG capsule Commonly known as: PAMELOR Take 1 capsule (50 mg total) by mouth at bedtime as needed.   omeprazole 40 MG capsule Commonly known as: PRILOSEC Take by mouth.   sildenafil 20 MG tablet Commonly known as: REVATIO Take 1-5 tablets as needed, one hour prior to intercourse   tamsulosin 0.4 MG Caps capsule Commonly known as: Flomax Take 1 capsule (0.4 mg total) by mouth daily.        Allergies: No Known Allergies  Family History: Family History  Problem Relation Age of Onset   CAD Sister     Diabetes Sister    Hypertension Sister    Diabetes Sister    Prostate cancer Father    Diabetes Mother    Bladder Cancer Neg Hx    Kidney cancer Neg Hx     Social History:  reports that he quit smoking about 8 years ago. His smoking use included cigarettes. He started smoking about 53 years ago. He has a 22.5 pack-year smoking history. He has never used smokeless tobacco. He reports that he does not drink alcohol and does not use drugs.  ROS: Pertinent ROS in HPI  Physical Exam: There were no vitals taken for this visit.  Constitutional:  Well nourished. Alert and oriented, No acute distress. HEENT: [place] AT, moist mucus membranes.  Trachea midline, no masses. Cardiovascular: No clubbing, cyanosis, or edema. Respiratory: Normal respiratory effort, no increased work of breathing. GI: Abdomen is soft, non tender, non distended, no abdominal masses. Liver and spleen not palpable.  No hernias appreciated.  Stool sample for occult testing is not indicated.   GU: No CVA tenderness.  No bladder fullness or masses.  Patient with circumcised/uncircumcised phallus. ***Foreskin easily retracted***  Urethral meatus is patent.  No penile discharge. No penile lesions or rashes. Scrotum without lesions, cysts, rashes and/or edema.  Testicles are located scrotally bilaterally. No masses are appreciated in the testicles. Left and right epididymis are normal. Rectal: Patient with  normal sphincter tone. Anus and perineum without scarring or rashes. No rectal masses are appreciated. Prostate is approximately *** grams, *** nodules are appreciated. Seminal vesicles are normal. Skin: No rashes, bruises or suspicious lesions. Lymph: No cervical or inguinal adenopathy. Neurologic: Grossly intact, no focal deficits, moving all 4 extremities. Psychiatric: Normal mood and affect.  Laboratory Data: ontains abnormal data Comprehensive Metabolic Panel (CMP) Order: [id number] Component Ref Range & Units 2 wk ago   Glucose 70 - 110 mg/dL 347 High   Sodium 425 - 145 mmol/L 142  Potassium 3.6 - 5.1 mmol/L 3.8  Chloride 97 - 109 mmol/L 105  Carbon Dioxide (CO2) 22.0 - 32.0 mmol/L 29.8  Urea Nitrogen (BUN) 7 - 25 mg/dL 18  Creatinine 0.7 - 1.3 mg/dL 1.4 High   Glomerular Filtration Rate (eGFR) >60 mL/min/1.73sq m 55 Low   Comment: CKD-EPI (2021) does not include patient's race in the calculation of eGFR.  Monitoring changes of plasma creatinine and eGFR over time is useful for monitoring kidney function.  Interpretive Ranges for eGFR (CKD-EPI 2021):  eGFR:       >60 mL/min/1.73 sq. m - Normal eGFR:       30-59 mL/min/1.73 sq. m - Moderately Decreased eGFR:       15-29 mL/min/1.73 sq. m  - Severely Decreased eGFR:       < 15 mL/min/1.73 sq. m  - Kidney Failure   Note: These eGFR  calculations do not apply in acute situations when eGFR is changing rapidly or patients on dialysis.  Calcium 8.7 - 10.3 mg/dL 9.3  AST 8 - 39 U/L 21  ALT 6 - 57 U/L 30  Alk Phos (alkaline Phosphatase) 34 - 104 U/L 104  Albumin 3.5 - 4.8 g/dL 4.4  Bilirubin, Total 0.3 - 1.2 mg/dL 0.7  Protein, Total 6.1 - 7.9 g/dL 7.2  A/G Ratio 1.0 - 5.0 gm/dL 1.6  Resulting Agency [facility]   Specimen Collected: 12/14/22 10:36   Performed by: [facility] Last Resulted: 12/14/22 15:42  Received From: [facility]  Result Received: 12/31/22 09:14   Hemoglobin A1C Order: [id number] Component Ref Range & Units 2 wk ago  Hemoglobin A1C 4.2 - 5.6 % 7.0 High   Average Blood Glucose (Calc) mg/dL 784  Resulting Agency [facility]  Narrative Performed by [facility] Normal Range:    4.2 - 5.6% Increased Risk:  5.7 - 6.4% Diabetes:        >= 6.5% Glycemic Control for adults with diabetes:  <7%    Specimen Collected: 12/14/22 10:36   Performed by: [facility] Last Resulted: 12/14/22 13:16  Received From: [facility]  Result Received: 12/31/22 09:14   Lipid Panel w/calc LDL Order: [id number] Component Ref Range & Units 2 wk ago  Cholesterol, Total 100 - 200 mg/dL 132  Triglyceride 35 - 199 mg/dL 440 High   HDL (High Density Lipoprotein) Cholesterol 29.0 - 71.0 mg/dL 10.2  LDL Calculated 0 - 130 mg/dL 51  VLDL Cholesterol mg/dL 60  Cholesterol/HDL Ratio 4.1  Resulting Agency [facility]   Specimen Collected: 12/14/22 10:36   Performed by: [facility] Last Resulted: 12/14/22 14:31  Received From: [facility]  Result Received: 12/31/22 09:14  I have reviewed the labs.   Pertinent Imaging: N/A  Assessment & Plan:  ***  1. Elevated PSA -PSA pending  2. BPH with LUTS -PSA stable *** -DRE benign *** -UA benign *** -PVR < 300 cc *** -symptoms - *** -most bothersome symptoms are *** -continue conservative management, avoiding bladder irritants and timed voiding's -Initiate alpha-blocker (***), discussed side effects *** -Initiate 5 alpha reductase inhibitor (***), discussed side effects *** -Continue tamsulosin 0.4 mg daily, alfuzosin 10 mg daily, Rapaflo 8 mg daily, terazosin, doxazosin, Cialis 5 mg daily and finasteride 5 mg daily, dutasteride 0.5 mg daily***:refills given -Cannot tolerate medication or medication failure, schedule cystoscopy ***  3. Erectile dysfunction - I explained to the patient that in order to achieve an erection it takes good functioning of the nervous system (parasympathetic and rs, sympathetic, sensory and motor), good blood flow into the erectile tissue of the penis and a desire to have sex - I explained that conditions like diabetes, hypertension, coronary artery disease, peripheral vascular disease, smoking, alcohol consumption, age, sleep apnea and BPH can diminish the ability to have an erection - I explained the ED may be a risk marker for underlying CVD and he should follow up with PCP for  further studies *** - we will obtain a serum testosterone level at this time; if it is abnormal we will need to repeat the study for confirmation *** - A recent study published in Sex Med 2018 Apr 13 revealed moderate to vigorous aerobic exercise for 40 minutes 4 times per week can decrease erectile problems  caused by physical inactivity, obesity, hypertension, metabolic syndrome and/or cardiovascular diseases *** - We discussed trying a *** different PDE5 inhibitor, intra-urethral suppositories, intracavernous vasoactive drug injection therapy, vacuum erection devices, LI-ESWT and penile prosthesis implantation    No follow-ups on file.  These notes generated with voice recognition software. I apologize for typographical errors.  Cloretta Ned  [facility] [address]  [address] [place], [place] [address] [phone]

## 2023-01-30 ENCOUNTER — Ambulatory Visit: Payer: Medicare HMO | Admitting: Urology

## 2023-04-10 ENCOUNTER — Emergency Department: Payer: Medicare HMO

## 2023-04-10 ENCOUNTER — Emergency Department
Admission: EM | Admit: 2023-04-10 | Discharge: 2023-04-10 | Disposition: A | Discharge status: Home / Self Care | Payer: Medicare HMO | Attending: Emergency Medicine | Admitting: Emergency Medicine

## 2023-04-10 DIAGNOSIS — R531 Weakness: Secondary | ICD-10-CM | POA: Diagnosis not present

## 2023-04-10 DIAGNOSIS — E1122 Type 2 diabetes mellitus with diabetic chronic kidney disease: Secondary | ICD-10-CM | POA: Insufficient documentation

## 2023-04-10 DIAGNOSIS — R202 Paresthesia of skin: Secondary | ICD-10-CM | POA: Insufficient documentation

## 2023-04-10 DIAGNOSIS — I771 Stricture of artery: Secondary | ICD-10-CM | POA: Diagnosis not present

## 2023-04-10 DIAGNOSIS — I69398 Other sequelae of cerebral infarction: Secondary | ICD-10-CM | POA: Diagnosis not present

## 2023-04-10 DIAGNOSIS — G9389 Other specified disorders of brain: Secondary | ICD-10-CM | POA: Diagnosis not present

## 2023-04-10 DIAGNOSIS — Z8673 Personal history of transient ischemic attack (TIA), and cerebral infarction without residual deficits: Secondary | ICD-10-CM | POA: Insufficient documentation

## 2023-04-10 DIAGNOSIS — R200 Anesthesia of skin: Secondary | ICD-10-CM | POA: Diagnosis not present

## 2023-04-10 DIAGNOSIS — J189 Pneumonia, unspecified organism: Secondary | ICD-10-CM | POA: Diagnosis not present

## 2023-04-10 DIAGNOSIS — R29818 Other symptoms and signs involving the nervous system: Secondary | ICD-10-CM | POA: Diagnosis not present

## 2023-04-10 DIAGNOSIS — R0602 Shortness of breath: Secondary | ICD-10-CM | POA: Diagnosis not present

## 2023-04-10 DIAGNOSIS — I129 Hypertensive chronic kidney disease with stage 1 through stage 4 chronic kidney disease, or unspecified chronic kidney disease: Secondary | ICD-10-CM | POA: Diagnosis not present

## 2023-04-10 DIAGNOSIS — R7401 Elevation of levels of liver transaminase levels: Secondary | ICD-10-CM | POA: Diagnosis not present

## 2023-04-10 DIAGNOSIS — N189 Chronic kidney disease, unspecified: Secondary | ICD-10-CM | POA: Insufficient documentation

## 2023-04-10 DIAGNOSIS — S0240CA Maxillary fracture, right side, initial encounter for closed fracture: Secondary | ICD-10-CM | POA: Diagnosis not present

## 2023-04-10 DIAGNOSIS — R002 Palpitations: Secondary | ICD-10-CM | POA: Diagnosis not present

## 2023-04-10 DIAGNOSIS — I63511 Cerebral infarction due to unspecified occlusion or stenosis of right middle cerebral artery: Secondary | ICD-10-CM | POA: Diagnosis not present

## 2023-04-10 DIAGNOSIS — R918 Other nonspecific abnormal finding of lung field: Secondary | ICD-10-CM | POA: Diagnosis not present

## 2023-04-10 LAB — CBC
HCT: 38.6 % — ABNORMAL LOW (ref 39.0–52.0)
Hemoglobin: 12 g/dL — ABNORMAL LOW (ref 13.0–17.0)
MCH: 25.6 pg — ABNORMAL LOW (ref 26.0–34.0)
MCHC: 31.1 g/dL (ref 30.0–36.0)
MCV: 82.3 fL (ref 80.0–100.0)
Platelets: 183 10*3/uL (ref 150–400)
RBC: 4.69 MIL/uL (ref 4.22–5.81)
RDW: 14.3 % (ref 11.5–15.5)
WBC: 3.3 10*3/uL — ABNORMAL LOW (ref 4.0–10.5)
nRBC: 0 % (ref 0.0–0.2)

## 2023-04-10 LAB — COMPREHENSIVE METABOLIC PANEL
ALT: 108 U/L — ABNORMAL HIGH (ref 0–44)
AST: 55 U/L — ABNORMAL HIGH (ref 15–41)
Albumin: 3.9 g/dL (ref 3.5–5.0)
Alkaline Phosphatase: 91 U/L (ref 38–126)
Anion gap: 9 (ref 5–15)
BUN: 11 mg/dL (ref 8–23)
CO2: 25 mmol/L (ref 22–32)
Calcium: 8.6 mg/dL — ABNORMAL LOW (ref 8.9–10.3)
Chloride: 104 mmol/L (ref 98–111)
Creatinine, Ser: 1.22 mg/dL (ref 0.61–1.24)
GFR, Estimated: >60 mL/min (ref >60)
Glucose, Bld: 288 mg/dL — ABNORMAL HIGH (ref 70–99)
Potassium: 3.7 mmol/L (ref 3.5–5.1)
Sodium: 138 mmol/L (ref 135–145)
Total Bilirubin: 1 mg/dL (ref <1.2)
Total Protein: 7.4 g/dL (ref 6.5–8.1)

## 2023-04-10 LAB — DIFFERENTIAL
Abs Immature Granulocytes: 0.01 10*3/uL (ref 0.00–0.07)
Basophils Absolute: 0 10*3/uL (ref 0.0–0.1)
Basophils Relative: 1 %
Eosinophils Absolute: 0.3 10*3/uL (ref 0.0–0.5)
Eosinophils Relative: 9 %
Immature Granulocytes: 0 %
Lymphocytes Relative: 45 %
Lymphs Abs: 1.5 10*3/uL (ref 0.7–4.0)
Monocytes Absolute: 0.3 10*3/uL (ref 0.1–1.0)
Monocytes Relative: 8 %
Neutro Abs: 1.2 10*3/uL — ABNORMAL LOW (ref 1.7–7.7)
Neutrophils Relative %: 37 %

## 2023-04-10 LAB — ETHANOL: Alcohol, Ethyl (B): <10 mg/dL (ref <10)

## 2023-04-10 LAB — PROTIME-INR
INR: 1 (ref 0.8–1.2)
Prothrombin Time: 13.5 s (ref 11.4–15.2)

## 2023-04-10 LAB — APTT: aPTT: 29 s (ref 24–36)

## 2023-04-10 LAB — CBG MONITORING, ED: Glucose-Capillary: 269 mg/dL — ABNORMAL HIGH (ref 70–99)

## 2023-04-10 LAB — TROPONIN I (HIGH SENSITIVITY)
Troponin I (High Sensitivity): 5 ng/L (ref <18)
Troponin I (High Sensitivity): 6 ng/L (ref <18)

## 2023-04-10 MED ORDER — AZITHROMYCIN 500 MG PO TABS: 500.0000 mg | ORAL_TABLET | Freq: Every day | ORAL | 0 refills | Status: AC

## 2023-04-10 MED ORDER — AMOXICILLIN-POT CLAVULANATE 875-125 MG PO TABS: 1.0000 | ORAL_TABLET | Freq: Two times a day (BID) | ORAL | 0 refills | Status: AC

## 2023-04-10 MED ORDER — SODIUM CHLORIDE 0.9% FLUSH
3.0000 mL | Freq: Once | INTRAVENOUS | Status: AC
  Administered 2023-04-10: 3 mL via INTRAVENOUS

## 2023-04-10 NOTE — ED Triage Notes (Signed)
Pt reports that he woke up today having palpitations and SOB. Pt also has hx of stroke and reports chronic L sided deficits and paresthesias. Reports worsening paresthesia today. Pt went to bed at 0100 today and was last normal. LVO neg during triage.

## 2023-04-10 NOTE — ED Provider Notes (Signed)
Care of this patient assumed from prior physician at 1500 pending MRI, chest x-Claudell Wohler, and disposition. Please see prior physician note for further details.  Briefly this is a 67 year old male who presented to the emergency department for evaluation of palpitations as well as paresthesias over the left side of his body.  He does have a history of a prior CVA, but reported paresthesias are new, but was outside window for intervention.  Head CT was without acute findings.  An MRI was obtained to further evaluate for stroke. This resulted without acute findings.  Labs sent from prior provider with leukopenia, no critical derangements noted.  Chest x-Gertrude Bucks was with questionable left-sided infiltrate.  Patient with stable vitals, unlabored respirations on room air.  Discussed results of workup with patient and family.  They are comfortable with discharge home.  No evidence of sepsis, but will DC with oral antibiotics given questionable infiltrate on x-Doyl Bitting.  Strict return precautions were provided.  Patient was discharged in stable condition.   Trinna Post, MD 04/10/23 [phone]

## 2023-04-10 NOTE — ED Notes (Signed)
Pt returned from MRI °

## 2023-04-10 NOTE — ED Notes (Addendum)
Pt taken to CT.

## 2023-04-10 NOTE — ED Notes (Signed)
Pt asking for lights turned off.

## 2023-04-10 NOTE — ED Notes (Signed)
Wife at RN station asking if pt can eat, informed that would have to wait until MRI results before eating.

## 2023-04-10 NOTE — ED Provider Notes (Signed)
[facility] Provider Note    Event Date/Time   First MD Initiated Contact with Patient 04/10/23 1236     (approximate)   History   Chief Complaint Palpitations and Shortness of Breath   HPI  Daniel [name] is a 67 y.o. male with past medical history of hypertension, diabetes, stroke with left-sided deficits, CKD, and seizures who presents to the ED complaining of numbness and palpitations.  Patient reports that last night around 10 or 11 PM he began to notice numbness and tingling over the entire left side of his body.  He has significant weakness on this side due to prior stroke, denies any change in the weakness but states he has never had issues with numbness in the past.  Symptoms seem to ease up before he went to bed around 1 AM, but when he woke up the numbness had gotten worse.  He also felt like his heart was racing this morning with uncomfortable "thumps" with his heartbeat in the right side of his chest.  He reports feeling slightly short of breath but denies any fevers or cough and has not had any pain or swelling in his legs.     Physical Exam   Triage Vital Signs: ED Triage Vitals [04/10/23 1226]  Encounter Vitals Group     BP (!) 156/96     Systolic BP Percentile      Diastolic BP Percentile      Pulse Rate 78     Resp 14     Temp 98.2 F (36.8 C)     Temp Source Oral     SpO2 97 %     Weight      Height      Head Circumference      Peak Flow      Pain Score 8     Pain Loc      Pain Education      Exclude from Growth Chart     Most recent vital signs: Vitals:   04/10/23 1226 04/10/23 1300  BP: (!) 156/96 (!) 140/87  Pulse: 78 (!) 54  Resp: 14 19  Temp: 98.2 F (36.8 C)   SpO2: 97% 96%    Constitutional: Alert and oriented. Eyes: Conjunctivae are normal. Head: Atraumatic. Nose: No congestion/rhinnorhea. Mouth/Throat: Mucous membranes are moist.  Cardiovascular: Normal rate, regular rhythm. Grossly normal heart  sounds.  2+ radial pulses bilaterally. Respiratory: Normal respiratory effort.  No retractions. Lungs CTAB. Gastrointestinal: Soft and nontender. No distention. Musculoskeletal: No lower extremity tenderness nor edema.  Neurologic:  Normal speech and language.  3 out of 5 strength in left upper extremity, 4 out of 5 strength in left lower extremity, 5 out of 5 strength in right upper and lower extremities.    ED Results / Procedures / Treatments   Labs (all labs ordered are listed, but only abnormal results are displayed) Labs Reviewed  CBC - Abnormal; Notable for the following components:      Result Value   WBC 3.3 (*)    Hemoglobin 12.0 (*)    HCT 38.6 (*)    MCH 25.6 (*)    All other components within normal limits  DIFFERENTIAL - Abnormal; Notable for the following components:   Neutro Abs 1.2 (*)    All other components within normal limits  COMPREHENSIVE METABOLIC PANEL - Abnormal; Notable for the following components:   Glucose, Bld 288 (*)    Calcium 8.6 (*)    AST 55 (*)  ALT 108 (*)    All other components within normal limits  CBG MONITORING, ED - Abnormal; Notable for the following components:   Glucose-Capillary 269 (*)    All other components within normal limits  PROTIME-INR  APTT  ETHANOL  CBG MONITORING, ED  TROPONIN I (HIGH SENSITIVITY)  TROPONIN I (HIGH SENSITIVITY)     EKG  ED ECG REPORT I, Chesley Noon, the attending physician, personally viewed and interpreted this ECG.   Date: 04/10/2023  EKG Time: 12:33  Rate: 79  Rhythm: normal sinus rhythm  Axis: Normal  Intervals:none  ST&T Change: Nonspecific T wave abnormality  RADIOLOGY CT head reviewed and interpreted by me with no hemorrhage or midline shift.  PROCEDURES:  Critical Care performed: No  Procedures   MEDICATIONS ORDERED IN ED: Medications  sodium chloride flush (NS) 0.9 % injection 3 mL (3 mLs Intravenous Given 04/10/23 1258)     IMPRESSION / MDM / ASSESSMENT AND  PLAN / ED COURSE  I reviewed the triage vital signs and the nursing notes.                              67 y.o. male with past medical history of hypertension, diabetes, stroke, seizures, and CKD who presents to the ED complaining of palpitations since this morning with numbness and tingling affecting the left side of his body since last night.  Patient's presentation is most consistent with acute presentation with potential threat to life or bodily function.  Differential diagnosis includes, but is not limited to, stroke, TIA, seizure, complicated migraine, arrhythmia, ACS, anemia, electrolyte abnormality, AKI.  Patient nontoxic-appearing and in no acute distress, vital signs are unremarkable.  He reports numbness and tingling on the left side of his body, but reports no change in strength since the onset of the symptoms, exam appears similar to previous.  He is outside the window for TNK and no findings concerning for large vessel occlusion, will further assess with CT head and labs.  EKG shows no evidence of arrhythmia or ischemia, will observe on cardiac monitor.  Labs are reassuring with no significant anemia, leukocytosis, electrolyte abnormality, or AKI.  Patient with mild transaminitis but LFTs otherwise unremarkable.  No arrhythmia noted on cardiac monitor and troponin within normal limits.  CT head is negative for acute process, but given acute onset of left-sided weakness, we will further assess with MRI to rule out acute stroke.  Patient turned over to oncoming provider pending MRI results.      FINAL CLINICAL IMPRESSION(S) / ED DIAGNOSES   Final diagnoses:  Palpitations  Left sided numbness     Rx / DC Orders   ED Discharge Orders     None        Note:  This document was prepared using Dragon voice recognition software and may include unintentional dictation errors.   Chesley Noon, MD 04/10/23 [phone]

## 2023-04-10 NOTE — Discharge Instructions (Addendum)
You were seen in the emergency department today for evaluation of your palpitations and weakness.  Your MRI fortunately did not show any stroke.  Your x-Mitra Duling was concerning for a possible pneumonia.  I sent a prescription for 2 antibiotics to your pharmacy.  Please take these as directed.  Follow-up with your primary care doctor for further evaluation.  Return to the ER for new or worsening symptoms.

## 2023-04-10 NOTE — ED Notes (Signed)
Pt taken to MRI  

## 2023-04-10 NOTE — ED Notes (Signed)
Pt taken to XR.  

## 2023-04-10 NOTE — ED Notes (Signed)
Pt. returned from XR. 

## 2023-04-10 NOTE — ED Notes (Signed)
Pt returned from CT °

## 2023-05-02 DIAGNOSIS — Z2821 Immunization not carried out because of patient refusal: Secondary | ICD-10-CM | POA: Diagnosis not present

## 2023-05-02 DIAGNOSIS — F1020 Alcohol dependence, uncomplicated: Secondary | ICD-10-CM | POA: Diagnosis not present

## 2023-05-02 DIAGNOSIS — I129 Hypertensive chronic kidney disease with stage 1 through stage 4 chronic kidney disease, or unspecified chronic kidney disease: Secondary | ICD-10-CM | POA: Diagnosis not present

## 2023-05-02 DIAGNOSIS — Z794 Long term (current) use of insulin: Secondary | ICD-10-CM | POA: Diagnosis not present

## 2023-05-02 DIAGNOSIS — E1122 Type 2 diabetes mellitus with diabetic chronic kidney disease: Secondary | ICD-10-CM | POA: Diagnosis not present

## 2023-05-02 DIAGNOSIS — N183 Chronic kidney disease, stage 3 unspecified: Secondary | ICD-10-CM | POA: Diagnosis not present

## 2023-05-02 DIAGNOSIS — F01518 Vascular dementia, unspecified severity, with other behavioral disturbance: Secondary | ICD-10-CM | POA: Diagnosis not present

## 2023-05-02 DIAGNOSIS — G40909 Epilepsy, unspecified, not intractable, without status epilepticus: Secondary | ICD-10-CM | POA: Diagnosis not present

## 2023-10-25 ENCOUNTER — Encounter: Payer: Self-pay | Admitting: Emergency Medicine

## 2023-10-25 ENCOUNTER — Emergency Department

## 2023-10-25 ENCOUNTER — Emergency Department
Admission: EM | Admit: 2023-10-25 | Discharge: 2023-10-25 | Disposition: A | Discharge status: Home / Self Care | Attending: Emergency Medicine | Admitting: Emergency Medicine

## 2023-10-25 ENCOUNTER — Other Ambulatory Visit: Payer: Self-pay

## 2023-10-25 DIAGNOSIS — H6121 Impacted cerumen, right ear: Secondary | ICD-10-CM

## 2023-10-25 DIAGNOSIS — H6123 Impacted cerumen, bilateral: Secondary | ICD-10-CM | POA: Diagnosis not present

## 2023-10-25 DIAGNOSIS — S2242XA Multiple fractures of ribs, left side, initial encounter for closed fracture: Secondary | ICD-10-CM | POA: Diagnosis not present

## 2023-10-25 DIAGNOSIS — H9193 Unspecified hearing loss, bilateral: Secondary | ICD-10-CM | POA: Diagnosis not present

## 2023-10-25 DIAGNOSIS — R079 Chest pain, unspecified: Secondary | ICD-10-CM | POA: Insufficient documentation

## 2023-10-25 DIAGNOSIS — R0789 Other chest pain: Secondary | ICD-10-CM | POA: Diagnosis not present

## 2023-10-25 DIAGNOSIS — H6122 Impacted cerumen, left ear: Secondary | ICD-10-CM

## 2023-10-25 LAB — CBC
HCT: 41.5 % (ref 39.0–52.0)
Hemoglobin: 12.9 g/dL — ABNORMAL LOW (ref 13.0–17.0)
MCH: 25.6 pg — ABNORMAL LOW (ref 26.0–34.0)
MCHC: 31.1 g/dL (ref 30.0–36.0)
MCV: 82.5 fL (ref 80.0–100.0)
Platelets: 131 10*3/uL — ABNORMAL LOW (ref 150–400)
RBC: 5.03 MIL/uL (ref 4.22–5.81)
RDW: 13.5 % (ref 11.5–15.5)
WBC: 4 10*3/uL (ref 4.0–10.5)
nRBC: 0 % (ref 0.0–0.2)

## 2023-10-25 LAB — BASIC METABOLIC PANEL WITH GFR
Anion gap: 10 (ref 5–15)
BUN: 17 mg/dL (ref 8–23)
CO2: 25 mmol/L (ref 22–32)
Calcium: 8.3 mg/dL — ABNORMAL LOW (ref 8.9–10.3)
Chloride: 104 mmol/L (ref 98–111)
Creatinine, Ser: 1.33 mg/dL — ABNORMAL HIGH (ref 0.61–1.24)
GFR, Estimated: 58 mL/min — ABNORMAL LOW (ref >60)
Glucose, Bld: 81 mg/dL (ref 70–99)
Potassium: 3.6 mmol/L (ref 3.5–5.1)
Sodium: 139 mmol/L (ref 135–145)

## 2023-10-25 LAB — TROPONIN I (HIGH SENSITIVITY): Troponin I (High Sensitivity): 5 ng/L (ref <18)

## 2023-10-25 NOTE — ED Triage Notes (Signed)
 Patient to ED via POV for possible foreign body in right ear. Pt states he believes something from the ceiling fell into his ear. PT states it feels like plastic is in his ear.

## 2023-10-25 NOTE — Discharge Instructions (Addendum)
 Patient left AMA without receiving discharge instructions prior to chest x-ray and EKG being read by radiologist for chest pain.

## 2023-10-25 NOTE — ED Notes (Signed)
 See triage note  Presents with possible f/b in right ear Denies any fever or drainage

## 2023-10-25 NOTE — ED Provider Notes (Signed)
 [facility] Provider Note    Event Date/Time   First MD Initiated Contact with Patient 10/25/23 1325     (approximate)   History   Foreign Body in Ear   HPI  Daniel [name] is a 68 y.o. male presenting to the emergency department for possible foreign body of the right ear.  States he noticed his ear feeling full and "like plastic" this morning and felt like something dropped into it from the ceiling.  Patient also reports heavy chest pain for 3 days that spans from his right pectoralis muscle to his left pectoralis muscle.  Denies radiation to his arms or back.  Denies fever, chills, cough, congestion, abdominal pain, nausea, vomiting, diarrhea.  Denies recent sickness/illness.  No recent travel.      Physical Exam   Triage Vital Signs: ED Triage Vitals  Encounter Vitals Group     BP 10/25/23 1305 121/72     Systolic BP Percentile --      Diastolic BP Percentile --      Pulse Rate 10/25/23 1305 95     Resp 10/25/23 1305 17     Temp 10/25/23 1305 97.8 F (36.6 C)     Temp Source 10/25/23 1305 Oral     SpO2 10/25/23 1305 100 %     Weight 10/25/23 1304 200 lb (90.7 kg)     Height 10/25/23 1304 5\' 8"  (1.727 m)     Head Circumference --      Peak Flow --      Pain Score --      Pain Loc --      Pain Education --      Exclude from Growth Chart --     Most recent vital signs: Vitals:   10/25/23 1305  BP: 121/72  Pulse: 95  Resp: 17  Temp: 97.8 F (36.6 C)  SpO2: 100%     General: Well-appearing, in no acute distress. Appears stated age. Head: Normocephalic, atraumatic. Eyes: No scleral icterus or conjunctival injection. Ears/Nose/Throat: Bilateral ears with diffuse cerumen, TMs intact bilaterally.  No foreign body seen.  Trouble hearing finger rub with right ear. Nares patent, no nasal discharge. Oropharynx moist, no erythema or exudate. Dentition intact. Neck: Supple, no nuchal rigidity. CV: Regular rate. No murmurs, rubs, or gallops.  Peripheral pulses 2+ and symmetric. No edema. Respiratory: Breath sounds clear b/l. No wheezes, rales, or rhonchi. No respiratory distress. Normal respiratory effort. GI: Soft, non-distended. Skin:Warm, dry, intact. No rashes, lesions, or ecchymosis. No cyanosis or pallor. Neurological: A&Ox4 to person, place, time, and situation.  Psychiatric: Mood and affect appropriate. Thought processes coherent.   ED Results / Procedures / Treatments   Labs (all labs ordered are listed, but only abnormal results are displayed) Labs Reviewed  BASIC METABOLIC PANEL WITH GFR - Abnormal; Notable for the following components:      Result Value   Creatinine, Ser 1.33 (*)    Calcium  8.3 (*)    GFR, Estimated 58 (*)    All other components within normal limits  CBC - Abnormal; Notable for the following components:   Hemoglobin 12.9 (*)    MCH 25.6 (*)    Platelets 131 (*)    All other components within normal limits  TROPONIN I (HIGH SENSITIVITY)  TROPONIN I (HIGH SENSITIVITY)     EKG  Rate: 81 Rhythm: Regular, normal sinus rhythm Axis: Positive P Waves: Present before every QRS PR Interval: 184 ms QRS Complex: 80 ms ST  Segment: No depression or elevation, not concerning for STEMI or NSTEMI QT interval: 386 ms T Waves: Normal No evidence of LVH, LBBB, or RBBB.   RADIOLOGY Chest x-ray ordered.  Imaging was independently reviewed and interpreted by me as well as the radiologist. I agree with the radiologist's report that there are no acute cardiopulmonary findings.   PROCEDURES:  Critical Care performed: No  Ear Cerumen Removal  Date/Time: 10/25/2023 3:21 PM  Performed by: Thomasenia Flesher, PA-C Authorized by: Thomasenia Flesher, PA-C   Consent:    Consent obtained:  Verbal   Consent given by:  Patient   Risks, benefits, and alternatives were discussed: yes     Risks discussed:  Bleeding, infection, pain, dizziness, incomplete removal and TM perforation   Alternatives discussed:  No  treatment Universal protocol:    Procedure explained and questions answered to patient or proxy's satisfaction: yes     Immediately prior to procedure, a time out was called: yes     Patient identity confirmed:  Verbally with patient Procedure details:    Location:  R ear and L ear   Procedure type: irrigation     Procedure outcomes: cerumen removed   Post-procedure details:    Inspection:  No bleeding, TM intact and some cerumen remaining   Hearing quality:  Improved   Procedure completion:  Tolerated well, no immediate complications    MEDICATIONS ORDERED IN ED: Medications - No data to display   IMPRESSION / MDM / ASSESSMENT AND PLAN / ED COURSE  I reviewed the triage vital signs and the nursing notes.                              Differential diagnosis includes, but is not limited to, cerumen impaction, foreign body to the ear, ACS, pneumonia  Patient's presentation is most consistent with acute complicated illness / injury requiring diagnostic workup.  Patient presented today for what he believed was a foreign body in his right ear.  Physical exam showed severe cerumen impaction in both ears with hearing loss and right ear.  Cerumen removal was done in bilateral ears, please see procedure note for full details.  Reexamination of bilateral ear shows significant cerumen removal, but there is still some cerumen left.  I discussed how he can pick up Debrox over-the-counter and do peroxide and warm water in his ears again for removal. Patient also reported chest pain in the room that he did not state in triage.  Ordered CBC BMP troponin EKG and chest x-ray.  CBC is reassuring with white blood cell count of 4.  BMP shows creatinine of 1.33 and estimated GFR 58, looks like patient has history of CKD.  Troponin of 5.  Chest x-ray ordered and shows no acute cardiopulmonary findings.  EKG shows normal sinus rhythm, no concern for STEMI or NSTEMI.  Patient and wife decided to leave AMA due  to hunger prior to results of chest x-ray and EKG.   Patient understands all of the risk presented of leaving AMA, including death, disability.   Patient states he will follow-up with  [facility].  He should also follow-up in with his primary care in the emergency department for worsening chest pain or any other new or concerning symptoms.  Clinical Course as of 10/25/23 1657  Fri Oct 25, 2023  1516 DG Chest [place] 1 View [SD]  [phone] ED EKG [SD]  1656 ED EKG [SD]    Clinical Course  User Index [SD] Thomasenia Flesher, PA-C     FINAL CLINICAL IMPRESSION(S) / ED DIAGNOSES   Final diagnoses:  Hearing loss of right ear due to cerumen impaction  Impacted cerumen, left ear  Chest pain in adult     Rx / DC Orders   ED Discharge Orders     None        Note:  This document was prepared using Dragon voice recognition software and may include unintentional dictation errors.    Jorie Newness Hampden, PA-C 10/25/23 1915    Bradler, Evan K, MD 10/26/23 [phone]

## 2023-11-06 DIAGNOSIS — J301 Allergic rhinitis due to pollen: Secondary | ICD-10-CM | POA: Diagnosis not present

## 2023-11-06 DIAGNOSIS — H6123 Impacted cerumen, bilateral: Secondary | ICD-10-CM | POA: Diagnosis not present

## 2023-11-06 DIAGNOSIS — H9203 Otalgia, bilateral: Secondary | ICD-10-CM | POA: Diagnosis not present

## 2023-12-03 DIAGNOSIS — K648 Other hemorrhoids: Secondary | ICD-10-CM | POA: Diagnosis not present

## 2023-12-03 DIAGNOSIS — K5909 Other constipation: Secondary | ICD-10-CM | POA: Diagnosis not present

## 2023-12-03 DIAGNOSIS — R198 Other specified symptoms and signs involving the digestive system and abdomen: Secondary | ICD-10-CM | POA: Diagnosis not present

## 2023-12-03 DIAGNOSIS — Z860101 Personal history of adenomatous and serrated colon polyps: Secondary | ICD-10-CM | POA: Diagnosis not present

## 2023-12-04 DIAGNOSIS — E119 Type 2 diabetes mellitus without complications: Secondary | ICD-10-CM | POA: Diagnosis not present

## 2024-02-03 DIAGNOSIS — G40909 Epilepsy, unspecified, not intractable, without status epilepticus: Secondary | ICD-10-CM | POA: Diagnosis not present

## 2024-02-03 DIAGNOSIS — Z125 Encounter for screening for malignant neoplasm of prostate: Secondary | ICD-10-CM | POA: Diagnosis not present

## 2024-02-03 DIAGNOSIS — N183 Chronic kidney disease, stage 3 unspecified: Secondary | ICD-10-CM | POA: Diagnosis not present

## 2024-02-03 DIAGNOSIS — I129 Hypertensive chronic kidney disease with stage 1 through stage 4 chronic kidney disease, or unspecified chronic kidney disease: Secondary | ICD-10-CM | POA: Diagnosis not present

## 2024-02-03 DIAGNOSIS — Z0000 Encounter for general adult medical examination without abnormal findings: Secondary | ICD-10-CM | POA: Diagnosis not present

## 2024-02-03 DIAGNOSIS — Z8673 Personal history of transient ischemic attack (TIA), and cerebral infarction without residual deficits: Secondary | ICD-10-CM | POA: Diagnosis not present

## 2024-02-03 DIAGNOSIS — E1122 Type 2 diabetes mellitus with diabetic chronic kidney disease: Secondary | ICD-10-CM | POA: Diagnosis not present

## 2024-02-03 DIAGNOSIS — Z1331 Encounter for screening for depression: Secondary | ICD-10-CM | POA: Diagnosis not present

## 2024-02-03 DIAGNOSIS — Z794 Long term (current) use of insulin: Secondary | ICD-10-CM | POA: Diagnosis not present

## 2024-05-10 ENCOUNTER — Emergency Department
Admission: EM | Admit: 2024-05-10 | Discharge: 2024-05-10 | Disposition: A | Discharge status: Home / Self Care | Attending: Emergency Medicine | Admitting: Emergency Medicine

## 2024-05-10 ENCOUNTER — Other Ambulatory Visit: Payer: Self-pay

## 2024-05-10 DIAGNOSIS — Z794 Long term (current) use of insulin: Secondary | ICD-10-CM | POA: Insufficient documentation

## 2024-05-10 DIAGNOSIS — Z7984 Long term (current) use of oral hypoglycemic drugs: Secondary | ICD-10-CM | POA: Insufficient documentation

## 2024-05-10 DIAGNOSIS — I10 Essential (primary) hypertension: Secondary | ICD-10-CM | POA: Insufficient documentation

## 2024-05-10 DIAGNOSIS — Z79899 Other long term (current) drug therapy: Secondary | ICD-10-CM | POA: Insufficient documentation

## 2024-05-10 DIAGNOSIS — H6122 Impacted cerumen, left ear: Secondary | ICD-10-CM | POA: Insufficient documentation

## 2024-05-10 DIAGNOSIS — E119 Type 2 diabetes mellitus without complications: Secondary | ICD-10-CM | POA: Insufficient documentation

## 2024-05-10 NOTE — ED Provider Notes (Signed)
 [place] [facility] AT [facility] Provider Note   CSN: [id number] Arrival date & time: 05/10/24  9196     Patient presents with: Otalgia   Daniel [name] is a 68 y.o. male.  Presents via EMS for evaluation of left ear foreign body sensation.  Patient with history of hypertension, diabetes, stroke.  He presents with wife who states he is at baseline.  Patient states today and yesterday he felt something moving in his left ear and he is concerned that something flew into the ear.  He denies any pain or trauma or bleeding.  Denies any right ear symptoms.  No fevers cough congestion runny nose.  Wife states his overall condition is at baseline he is doing well       Prior to Admission medications  Medication Sig Start Date End Date Taking? Authorizing Provider  amLODipine  (NORVASC ) 10 MG tablet Take 1 tablet (10 mg total) by mouth daily. 06/27/21   Patel, Sona, MD  atorvastatin  (LIPITOR) 40 MG tablet Take 40 mg by mouth daily. 08/17/18   [provider]  baclofen  (LIORESAL ) 10 MG tablet Take 10 mg by mouth 3 (three) times daily. 04/01/21   [provider]  blood glucose meter kit and supplies KIT Dispense based on patient and insurance preference. Use up to four times daily as directed. (FOR ICD-9 250.00, 250.01). 06/16/19   Krishnan, Sendil K, MD  Docusate Sodium  (DSS) 100 MG CAPS Take 100 mg by mouth in the morning and at bedtime. 08/04/19   [provider]  DULoxetine  (CYMBALTA ) 60 MG capsule Take 60 mg by mouth daily. Takes with 30 mg 10/24/18   [provider]  finasteride  (PROSCAR ) 5 MG tablet Take 1 tablet (5 mg total) by mouth daily. 10/03/22   Penne Knee, MD  gabapentin  (NEURONTIN ) 300 MG capsule Take 600 mg by mouth 3 (three) times daily.    [provider]  insulin  glargine (LANTUS ) 100 UNIT/ML injection Inject 0.25 mLs (25 Units total) into the skin at bedtime. 01/02/20   Josette Ade, MD  lamoTRIgine  (LAMICTAL )  200 MG tablet Take 1 tablet (200 mg total) by mouth 2 (two) times daily. 07/13/19   Tobie Yetta HERO, MD  levETIRAcetam  (KEPPRA ) 500 MG tablet Take 1 tablet (500 mg total) by mouth 2 (two) times daily. 06/26/21   Patel, Sona, MD  metFORMIN  (GLUCOPHAGE ) 500 MG tablet Take 500 mg by mouth 2 (two) times daily. 06/22/19   [provider]  nortriptyline  (PAMELOR ) 50 MG capsule Take 1 capsule (50 mg total) by mouth at bedtime as needed. 06/26/21   Patel, Sona, MD  omeprazole (PRILOSEC) 40 MG capsule Take by mouth. 05/17/22   [provider]  sildenafil  (REVATIO ) 20 MG tablet Take 1-5 tablets as needed, one hour prior to intercourse 10/03/22   Penne Knee, MD  tamsulosin  (FLOMAX ) 0.4 MG CAPS capsule Take 1 capsule (0.4 mg total) by mouth daily. 07/24/17   Penne Knee, MD    Allergies: Patient has no known allergies.    Review of Systems  Updated Vital Signs BP (!) 168/116 (BP Location: Left Arm)   Pulse 70   Temp (!) 97.1 F (36.2 C) (Oral)   Resp 16   Ht 5' 7 (1.702 m)   Wt 106.1 kg   SpO2 97%   BMI 36.65 kg/m   Physical Exam Constitutional:      Appearance: He is well-developed.  HENT:     Head: Normocephalic and atraumatic.  Comments: Left TM with cerumen impaction.  Cerumen was completely removed, cerumen was dry and flaky throughout the canal.  After complete removal of cerumen with curette, TM visualized and there is no redness, bulging.  TM is intact.  Canal appears normal.  Right TM was vil visualized despite being asymptomatic.  Very dark hard impacted cerumen on the right side.  He denies any symptoms on the right ear, discussed options of irrigation, attempted removal with curette.  Also ENT follow-up. Eyes:     Conjunctiva/sclera: Conjunctivae normal.  Cardiovascular:     Rate and Rhythm: Normal rate.  Pulmonary:     Effort: Pulmonary effort is normal. No respiratory distress.  Musculoskeletal:        General: Normal range of motion.     Cervical  back: Normal range of motion.  Skin:    General: Skin is warm.     Findings: No rash.  Neurological:     General: No focal deficit present.     Mental Status: He is alert and oriented to person, place, and time. Mental status is at baseline.  Psychiatric:        Behavior: Behavior normal.        Thought Content: Thought content normal.     (all labs ordered are listed, but only abnormal results are displayed) Labs Reviewed - No data to display  EKG: None  Radiology: No results found.   Ear Cerumen Removal  Date/Time: 05/10/2024 8:38 AM  Performed by: Charlene Debby BROCKS, PA-C Authorized by: Charlene Debby BROCKS, PA-C   Consent:    Consent obtained:  Verbal   Consent given by:  Patient Procedure details:    Location:  L ear   Procedure type: curette     Procedure outcomes: cerumen removed   Post-procedure details:    Inspection:  Ear canal clear   Hearing quality:  Improved   Procedure completion:  Tolerated    Medications Ordered in the ED - No data to display                                  Medical Decision Making  68 year old male with left ear cerumen impaction.  Patient had a very dry flaky cerumen within the left ear canal.  This was all removed with a curette and patient tolerated procedure well.  Canal appears normal.  Patient asymptomatic.  No signs of any insects within the left ear canal.  Discussed right ear cerumen impaction with patient and wife.  We will have him try gentle irrigation at home but will also have them option for call to ENT for follow-up for possible ear canal irrigation with specialist. Final diagnoses:  Impacted cerumen, left ear    ED Discharge Orders     None          Charlene Debby BROCKS, PA-C 05/10/24 0840    Floy Roberts, MD 05/10/24 1025

## 2024-05-10 NOTE — ED Notes (Signed)
 See triage note  Presents with family per EMS from home  States he is having some discomfort in left ear  Unsure if something got in his ear  Also states he was a little dizzy Afebrile in arrival

## 2024-05-10 NOTE — Discharge Instructions (Addendum)
 Patient has cerumen impaction of the left ear.  Cerumen was removed.  There is some cerumen noted also in the right ear, would recommend follow-up with ENT for irrigation.

## 2024-05-10 NOTE — ED Triage Notes (Signed)
 To ED from home [facility] for L otalgia, believes an insect flew into his ear yesterday. Changing story per EMS and pt has dementia. L arm contracted from prior stroke. EMS VS: 196/106, HR 76 NSR, 96% RA, CBG 92  Pt states mild pain
